# Patient Record
Sex: Female | Born: 1955 | Race: Black or African American | Hispanic: No | Smoking: Never smoker
Health system: Southern US, Community
[De-identification: ages and names within clinical notes are randomized; demographics above are authoritative.]

## PROBLEM LIST (undated history)

## (undated) DIAGNOSIS — G4730 Sleep apnea, unspecified: Secondary | ICD-10-CM

## (undated) DIAGNOSIS — E119 Type 2 diabetes mellitus without complications: Secondary | ICD-10-CM

## (undated) DIAGNOSIS — K635 Polyp of colon: Secondary | ICD-10-CM

## (undated) DIAGNOSIS — I2699 Other pulmonary embolism without acute cor pulmonale: Secondary | ICD-10-CM

## (undated) DIAGNOSIS — E079 Disorder of thyroid, unspecified: Secondary | ICD-10-CM

## (undated) DIAGNOSIS — E785 Hyperlipidemia, unspecified: Secondary | ICD-10-CM

## (undated) DIAGNOSIS — I639 Cerebral infarction, unspecified: Secondary | ICD-10-CM

## (undated) DIAGNOSIS — I10 Essential (primary) hypertension: Secondary | ICD-10-CM

## (undated) HISTORY — PX: KNEE SURGERY: SHX244

## (undated) HISTORY — DX: Type 2 diabetes mellitus without complications: E11.9

## (undated) HISTORY — DX: Polyp of colon: K63.5

## (undated) HISTORY — PX: ABDOMINAL HYSTERECTOMY: SHX81

## (undated) HISTORY — PX: TUBAL LIGATION: SHX77

## (undated) HISTORY — PX: BLADDER SUSPENSION: SHX72

---

## 1997-07-10 ENCOUNTER — Inpatient Hospital Stay (HOSPITAL_COMMUNITY): Admission: AD | Admit: 1997-07-10 | Discharge: 1997-07-10 | Payer: Self-pay | Admitting: Obstetrics

## 1997-08-22 ENCOUNTER — Other Ambulatory Visit: Admission: RE | Admit: 1997-08-22 | Discharge: 1997-08-22 | Payer: Self-pay | Admitting: Obstetrics

## 1997-08-23 ENCOUNTER — Ambulatory Visit (HOSPITAL_COMMUNITY): Admission: RE | Admit: 1997-08-23 | Discharge: 1997-08-23 | Payer: Self-pay | Admitting: Gastroenterology

## 1997-09-01 ENCOUNTER — Ambulatory Visit (HOSPITAL_COMMUNITY): Admission: RE | Admit: 1997-09-01 | Discharge: 1997-09-01 | Payer: Self-pay | Admitting: Gastroenterology

## 1997-09-14 ENCOUNTER — Ambulatory Visit (HOSPITAL_COMMUNITY): Admission: RE | Admit: 1997-09-14 | Discharge: 1997-09-14 | Payer: Self-pay | Admitting: Endocrinology

## 1997-09-20 ENCOUNTER — Ambulatory Visit (HOSPITAL_COMMUNITY): Admission: RE | Admit: 1997-09-20 | Discharge: 1997-09-20 | Payer: Self-pay | Admitting: Endocrinology

## 1997-12-16 ENCOUNTER — Inpatient Hospital Stay (HOSPITAL_COMMUNITY): Admission: AD | Admit: 1997-12-16 | Discharge: 1997-12-16 | Payer: Self-pay | Admitting: *Deleted

## 1998-03-29 ENCOUNTER — Inpatient Hospital Stay (HOSPITAL_COMMUNITY): Admission: RE | Admit: 1998-03-29 | Discharge: 1998-04-02 | Payer: Self-pay | Admitting: *Deleted

## 1998-10-07 ENCOUNTER — Encounter: Payer: Self-pay | Admitting: Emergency Medicine

## 1998-10-07 ENCOUNTER — Emergency Department (HOSPITAL_COMMUNITY): Admission: EM | Admit: 1998-10-07 | Discharge: 1998-10-07 | Payer: Self-pay | Admitting: Emergency Medicine

## 1999-04-25 ENCOUNTER — Ambulatory Visit: Admission: RE | Admit: 1999-04-25 | Discharge: 1999-04-25 | Payer: Self-pay | Admitting: *Deleted

## 1999-05-07 ENCOUNTER — Ambulatory Visit (HOSPITAL_COMMUNITY): Admission: RE | Admit: 1999-05-07 | Discharge: 1999-05-07 | Payer: Self-pay | Admitting: *Deleted

## 2000-03-30 ENCOUNTER — Emergency Department (HOSPITAL_COMMUNITY): Admission: EM | Admit: 2000-03-30 | Discharge: 2000-03-30 | Payer: Self-pay | Admitting: Emergency Medicine

## 2000-05-21 ENCOUNTER — Encounter: Payer: Self-pay | Admitting: Emergency Medicine

## 2000-05-21 ENCOUNTER — Emergency Department (HOSPITAL_COMMUNITY): Admission: EM | Admit: 2000-05-21 | Discharge: 2000-05-22 | Payer: Self-pay | Admitting: Emergency Medicine

## 2000-07-15 ENCOUNTER — Encounter: Payer: Self-pay | Admitting: *Deleted

## 2000-07-15 ENCOUNTER — Ambulatory Visit (HOSPITAL_COMMUNITY): Admission: RE | Admit: 2000-07-15 | Discharge: 2000-07-15 | Payer: Self-pay | Admitting: *Deleted

## 2001-02-09 ENCOUNTER — Emergency Department (HOSPITAL_COMMUNITY): Admission: EM | Admit: 2001-02-09 | Discharge: 2001-02-09 | Payer: Self-pay | Admitting: Emergency Medicine

## 2001-02-09 ENCOUNTER — Encounter: Payer: Self-pay | Admitting: Emergency Medicine

## 2001-02-19 ENCOUNTER — Encounter: Payer: Self-pay | Admitting: Emergency Medicine

## 2001-02-19 ENCOUNTER — Ambulatory Visit (HOSPITAL_COMMUNITY): Admission: RE | Admit: 2001-02-19 | Discharge: 2001-02-19 | Payer: Self-pay | Admitting: Emergency Medicine

## 2001-07-22 ENCOUNTER — Encounter: Payer: Self-pay | Admitting: Internal Medicine

## 2001-07-22 ENCOUNTER — Ambulatory Visit (HOSPITAL_COMMUNITY): Admission: RE | Admit: 2001-07-22 | Discharge: 2001-07-22 | Payer: Self-pay | Admitting: Internal Medicine

## 2001-08-17 ENCOUNTER — Encounter: Payer: Self-pay | Admitting: Internal Medicine

## 2001-08-17 ENCOUNTER — Ambulatory Visit (HOSPITAL_COMMUNITY): Admission: RE | Admit: 2001-08-17 | Discharge: 2001-08-17 | Payer: Self-pay | Admitting: Family Medicine

## 2001-09-10 ENCOUNTER — Encounter: Payer: Self-pay | Admitting: Emergency Medicine

## 2001-09-10 ENCOUNTER — Emergency Department (HOSPITAL_COMMUNITY): Admission: EM | Admit: 2001-09-10 | Discharge: 2001-09-10 | Payer: Self-pay | Admitting: *Deleted

## 2001-09-11 ENCOUNTER — Emergency Department (HOSPITAL_COMMUNITY): Admission: EM | Admit: 2001-09-11 | Discharge: 2001-09-12 | Payer: Self-pay | Admitting: Emergency Medicine

## 2002-06-22 ENCOUNTER — Encounter: Payer: Self-pay | Admitting: Emergency Medicine

## 2002-06-22 ENCOUNTER — Emergency Department (HOSPITAL_COMMUNITY): Admission: EM | Admit: 2002-06-22 | Discharge: 2002-06-22 | Payer: Self-pay | Admitting: Emergency Medicine

## 2002-07-16 ENCOUNTER — Emergency Department (HOSPITAL_COMMUNITY): Admission: EM | Admit: 2002-07-16 | Discharge: 2002-07-16 | Payer: Self-pay | Admitting: Emergency Medicine

## 2003-01-20 ENCOUNTER — Ambulatory Visit (HOSPITAL_COMMUNITY): Admission: RE | Admit: 2003-01-20 | Discharge: 2003-01-20 | Payer: Self-pay | Admitting: Family Medicine

## 2003-05-24 ENCOUNTER — Emergency Department (HOSPITAL_COMMUNITY): Admission: EM | Admit: 2003-05-24 | Discharge: 2003-05-24 | Payer: Self-pay | Admitting: Emergency Medicine

## 2004-06-12 ENCOUNTER — Ambulatory Visit: Payer: Self-pay | Admitting: Internal Medicine

## 2004-07-03 ENCOUNTER — Ambulatory Visit: Payer: Self-pay | Admitting: Internal Medicine

## 2004-07-03 ENCOUNTER — Ambulatory Visit: Payer: Self-pay | Admitting: *Deleted

## 2004-07-16 ENCOUNTER — Ambulatory Visit (HOSPITAL_COMMUNITY): Admission: RE | Admit: 2004-07-16 | Discharge: 2004-07-16 | Payer: Self-pay | Admitting: Internal Medicine

## 2004-07-25 ENCOUNTER — Ambulatory Visit: Payer: Self-pay | Admitting: Internal Medicine

## 2004-08-30 ENCOUNTER — Ambulatory Visit: Payer: Self-pay | Admitting: Internal Medicine

## 2004-10-01 ENCOUNTER — Ambulatory Visit: Payer: Self-pay | Admitting: Internal Medicine

## 2004-10-31 ENCOUNTER — Ambulatory Visit: Payer: Self-pay | Admitting: Internal Medicine

## 2005-04-22 ENCOUNTER — Emergency Department (HOSPITAL_COMMUNITY): Admission: EM | Admit: 2005-04-22 | Discharge: 2005-04-22 | Payer: Self-pay | Admitting: Emergency Medicine

## 2005-05-22 ENCOUNTER — Ambulatory Visit: Payer: Self-pay | Admitting: Internal Medicine

## 2005-06-07 ENCOUNTER — Ambulatory Visit: Payer: Self-pay | Admitting: Internal Medicine

## 2005-07-02 ENCOUNTER — Ambulatory Visit: Payer: Self-pay | Admitting: Internal Medicine

## 2005-07-02 ENCOUNTER — Encounter: Payer: Self-pay | Admitting: Internal Medicine

## 2005-07-15 ENCOUNTER — Ambulatory Visit: Payer: Self-pay | Admitting: Internal Medicine

## 2005-08-05 ENCOUNTER — Ambulatory Visit (HOSPITAL_COMMUNITY): Admission: RE | Admit: 2005-08-05 | Discharge: 2005-08-05 | Payer: Self-pay | Admitting: Family Medicine

## 2005-08-21 ENCOUNTER — Encounter: Admission: RE | Admit: 2005-08-21 | Discharge: 2005-08-21 | Payer: Self-pay | Admitting: Internal Medicine

## 2005-11-15 ENCOUNTER — Ambulatory Visit: Payer: Self-pay | Admitting: Internal Medicine

## 2006-01-05 ENCOUNTER — Emergency Department (HOSPITAL_COMMUNITY): Admission: EM | Admit: 2006-01-05 | Discharge: 2006-01-06 | Payer: Self-pay | Admitting: Emergency Medicine

## 2006-04-02 ENCOUNTER — Ambulatory Visit: Payer: Self-pay | Admitting: Family Medicine

## 2006-04-08 ENCOUNTER — Ambulatory Visit: Payer: Self-pay | Admitting: Internal Medicine

## 2006-04-10 ENCOUNTER — Ambulatory Visit: Payer: Self-pay | Admitting: Internal Medicine

## 2006-06-02 ENCOUNTER — Ambulatory Visit: Payer: Self-pay | Admitting: *Deleted

## 2006-06-26 ENCOUNTER — Ambulatory Visit: Payer: Self-pay | Admitting: Internal Medicine

## 2006-06-26 ENCOUNTER — Inpatient Hospital Stay (HOSPITAL_COMMUNITY): Admission: EM | Admit: 2006-06-26 | Discharge: 2006-06-29 | Payer: Self-pay | Admitting: Emergency Medicine

## 2006-06-27 ENCOUNTER — Encounter (INDEPENDENT_AMBULATORY_CARE_PROVIDER_SITE_OTHER): Payer: Self-pay | Admitting: Neurology

## 2006-06-27 ENCOUNTER — Encounter: Payer: Self-pay | Admitting: Internal Medicine

## 2006-07-02 ENCOUNTER — Encounter: Admission: RE | Admit: 2006-07-02 | Discharge: 2006-09-30 | Payer: Self-pay | Admitting: Neurology

## 2006-07-10 ENCOUNTER — Ambulatory Visit: Payer: Self-pay | Admitting: Internal Medicine

## 2006-10-29 ENCOUNTER — Encounter (INDEPENDENT_AMBULATORY_CARE_PROVIDER_SITE_OTHER): Payer: Self-pay | Admitting: *Deleted

## 2006-11-05 ENCOUNTER — Ambulatory Visit: Payer: Self-pay | Admitting: Internal Medicine

## 2006-11-05 LAB — CONVERTED CEMR LAB
ALT: 25 units/L (ref 0–35)
AST: 17 units/L (ref 0–37)
Alkaline Phosphatase: 97 units/L (ref 39–117)
CO2: 27 meq/L (ref 19–32)
Creatinine, Ser: 0.7 mg/dL (ref 0.40–1.20)
LDL Cholesterol: 66 mg/dL (ref 0–99)
Sodium: 143 meq/L (ref 135–145)
TSH: 0.067 microintl units/mL — ABNORMAL LOW (ref 0.350–5.50)
Total Bilirubin: 0.4 mg/dL (ref 0.3–1.2)
Total CHOL/HDL Ratio: 2.5
Total Protein: 7.2 g/dL (ref 6.0–8.3)
VLDL: 18 mg/dL (ref 0–40)

## 2006-11-11 ENCOUNTER — Ambulatory Visit (HOSPITAL_COMMUNITY): Admission: RE | Admit: 2006-11-11 | Discharge: 2006-11-11 | Payer: Self-pay | Admitting: Family Medicine

## 2006-11-23 ENCOUNTER — Encounter: Admission: RE | Admit: 2006-11-23 | Discharge: 2006-11-23 | Payer: Self-pay | Admitting: Neurology

## 2006-11-26 ENCOUNTER — Emergency Department (HOSPITAL_COMMUNITY): Admission: EM | Admit: 2006-11-26 | Discharge: 2006-11-26 | Payer: Self-pay | Admitting: Emergency Medicine

## 2007-01-02 ENCOUNTER — Ambulatory Visit: Payer: Self-pay | Admitting: Internal Medicine

## 2007-01-02 ENCOUNTER — Encounter (INDEPENDENT_AMBULATORY_CARE_PROVIDER_SITE_OTHER): Payer: Self-pay | Admitting: Family Medicine

## 2007-01-02 LAB — CONVERTED CEMR LAB
AST: 15 units/L (ref 0–37)
Albumin: 4.4 g/dL (ref 3.5–5.2)
Alkaline Phosphatase: 96 units/L (ref 39–117)
BUN: 10 mg/dL (ref 6–23)
Basophils Relative: 1 % (ref 0–1)
Eosinophils Absolute: 0.4 10*3/uL (ref 0.2–0.7)
Eosinophils Relative: 6 % — ABNORMAL HIGH (ref 0–5)
Glucose, Bld: 105 mg/dL — ABNORMAL HIGH (ref 70–99)
HCT: 41.6 % (ref 36.0–46.0)
Lymphs Abs: 2.4 10*3/uL (ref 0.7–4.0)
MCHC: 30.5 g/dL (ref 30.0–36.0)
MCV: 97.7 fL (ref 78.0–100.0)
Monocytes Relative: 8 % (ref 3–12)
Neutrophils Relative %: 47 % (ref 43–77)
Platelets: 341 10*3/uL (ref 150–400)
Potassium: 4.6 meq/L (ref 3.5–5.3)
Sodium: 142 meq/L (ref 135–145)
T3 Uptake Ratio: 31.6 % (ref 22.5–37.0)
T4, Total: 12.2 ug/dL (ref 5.0–12.5)
Total Bilirubin: 0.3 mg/dL (ref 0.3–1.2)
WBC: 6.2 10*3/uL (ref 4.0–10.5)

## 2007-01-20 ENCOUNTER — Ambulatory Visit (HOSPITAL_BASED_OUTPATIENT_CLINIC_OR_DEPARTMENT_OTHER): Admission: RE | Admit: 2007-01-20 | Discharge: 2007-01-20 | Payer: Self-pay | Admitting: Family Medicine

## 2007-01-25 ENCOUNTER — Ambulatory Visit: Payer: Self-pay | Admitting: Internal Medicine

## 2007-02-17 ENCOUNTER — Ambulatory Visit: Payer: Self-pay | Admitting: Internal Medicine

## 2007-02-17 LAB — CONVERTED CEMR LAB
GC Probe Amp, Urine: NEGATIVE
TSH: 1.984 microintl units/mL (ref 0.350–5.50)

## 2007-03-10 ENCOUNTER — Encounter: Payer: Self-pay | Admitting: Internal Medicine

## 2007-03-10 ENCOUNTER — Other Ambulatory Visit: Admission: RE | Admit: 2007-03-10 | Discharge: 2007-03-10 | Payer: Self-pay | Admitting: Internal Medicine

## 2007-03-10 ENCOUNTER — Ambulatory Visit: Payer: Self-pay | Admitting: Internal Medicine

## 2007-03-12 ENCOUNTER — Ambulatory Visit: Payer: Self-pay | Admitting: Internal Medicine

## 2007-04-15 ENCOUNTER — Ambulatory Visit: Payer: Self-pay | Admitting: Internal Medicine

## 2007-04-15 LAB — CONVERTED CEMR LAB
ALT: 26 units/L (ref 0–35)
AST: 18 units/L (ref 0–37)
Albumin: 4.4 g/dL (ref 3.5–5.2)
CO2: 28 meq/L (ref 19–32)
Calcium: 9.3 mg/dL (ref 8.4–10.5)
Chloride: 101 meq/L (ref 96–112)
Cholesterol: 154 mg/dL (ref 0–200)
Microalb, Ur: 0.64 mg/dL (ref 0.00–1.89)
Potassium: 4.3 meq/L (ref 3.5–5.3)
Total Protein: 7.8 g/dL (ref 6.0–8.3)

## 2007-07-10 ENCOUNTER — Ambulatory Visit: Payer: Self-pay | Admitting: Internal Medicine

## 2007-07-11 ENCOUNTER — Encounter (INDEPENDENT_AMBULATORY_CARE_PROVIDER_SITE_OTHER): Payer: Self-pay | Admitting: Internal Medicine

## 2007-07-11 LAB — CONVERTED CEMR LAB
ALT: 18 units/L (ref 0–35)
Albumin: 4.1 g/dL (ref 3.5–5.2)
CO2: 27 meq/L (ref 19–32)
Calcium: 9.2 mg/dL (ref 8.4–10.5)
Chloride: 102 meq/L (ref 96–112)
Glucose, Bld: 83 mg/dL (ref 70–99)
Potassium: 4.6 meq/L (ref 3.5–5.3)
Sodium: 144 meq/L (ref 135–145)
TSH: 4.395 microintl units/mL (ref 0.350–5.50)
Total Bilirubin: 0.4 mg/dL (ref 0.3–1.2)
Total Protein: 7.3 g/dL (ref 6.0–8.3)

## 2007-10-01 ENCOUNTER — Ambulatory Visit: Payer: Self-pay | Admitting: Internal Medicine

## 2007-11-12 ENCOUNTER — Ambulatory Visit: Payer: Self-pay | Admitting: Internal Medicine

## 2007-12-11 ENCOUNTER — Ambulatory Visit (HOSPITAL_COMMUNITY): Admission: RE | Admit: 2007-12-11 | Discharge: 2007-12-11 | Payer: Self-pay | Admitting: Internal Medicine

## 2008-03-20 ENCOUNTER — Emergency Department (HOSPITAL_COMMUNITY): Admission: EM | Admit: 2008-03-20 | Discharge: 2008-03-20 | Payer: Self-pay | Admitting: Emergency Medicine

## 2008-04-14 ENCOUNTER — Ambulatory Visit: Payer: Self-pay | Admitting: Internal Medicine

## 2008-04-14 LAB — CONVERTED CEMR LAB
Band Neutrophils: 0 % (ref 0–10)
Basophils Absolute: 0 10*3/uL (ref 0.0–0.1)
Basophils Relative: 1 % (ref 0–1)
HCT: 41.7 % (ref 36.0–46.0)
Magnesium: 1.9 mg/dL (ref 1.5–2.5)
Monocytes Absolute: 0.4 10*3/uL (ref 0.1–1.0)
Neutro Abs: 3 10*3/uL (ref 1.7–7.7)
Platelets: 367 10*3/uL (ref 150–400)
RDW: 15.1 % (ref 11.5–15.5)

## 2008-05-20 ENCOUNTER — Ambulatory Visit: Payer: Self-pay | Admitting: Internal Medicine

## 2008-07-06 ENCOUNTER — Ambulatory Visit (HOSPITAL_COMMUNITY): Admission: RE | Admit: 2008-07-06 | Discharge: 2008-07-06 | Payer: Self-pay | Admitting: Internal Medicine

## 2008-07-06 ENCOUNTER — Ambulatory Visit: Payer: Self-pay | Admitting: Internal Medicine

## 2008-07-30 ENCOUNTER — Emergency Department (HOSPITAL_COMMUNITY): Admission: EM | Admit: 2008-07-30 | Discharge: 2008-07-30 | Payer: Self-pay | Admitting: Family Medicine

## 2008-09-06 ENCOUNTER — Ambulatory Visit: Payer: Self-pay | Admitting: Internal Medicine

## 2008-10-20 ENCOUNTER — Ambulatory Visit: Payer: Self-pay | Admitting: Infectious Diseases

## 2008-10-20 ENCOUNTER — Inpatient Hospital Stay (HOSPITAL_COMMUNITY): Admission: EM | Admit: 2008-10-20 | Discharge: 2008-10-25 | Payer: Self-pay | Admitting: Emergency Medicine

## 2008-10-21 ENCOUNTER — Encounter: Payer: Self-pay | Admitting: Infectious Diseases

## 2008-10-21 ENCOUNTER — Encounter (INDEPENDENT_AMBULATORY_CARE_PROVIDER_SITE_OTHER): Payer: Self-pay | Admitting: Internal Medicine

## 2008-10-22 ENCOUNTER — Ambulatory Visit: Payer: Self-pay | Admitting: Surgery

## 2008-10-25 ENCOUNTER — Encounter: Payer: Self-pay | Admitting: Infectious Diseases

## 2008-10-26 ENCOUNTER — Telehealth (INDEPENDENT_AMBULATORY_CARE_PROVIDER_SITE_OTHER): Payer: Self-pay | Admitting: *Deleted

## 2008-10-28 ENCOUNTER — Ambulatory Visit: Payer: Self-pay | Admitting: Internal Medicine

## 2009-01-10 ENCOUNTER — Ambulatory Visit: Payer: Self-pay | Admitting: Internal Medicine

## 2009-01-31 ENCOUNTER — Ambulatory Visit: Payer: Self-pay | Admitting: Internal Medicine

## 2009-02-01 ENCOUNTER — Encounter (INDEPENDENT_AMBULATORY_CARE_PROVIDER_SITE_OTHER): Payer: Self-pay | Admitting: Internal Medicine

## 2009-02-28 ENCOUNTER — Ambulatory Visit: Payer: Self-pay | Admitting: Family Medicine

## 2009-02-28 ENCOUNTER — Encounter (INDEPENDENT_AMBULATORY_CARE_PROVIDER_SITE_OTHER): Payer: Self-pay | Admitting: Internal Medicine

## 2009-02-28 LAB — CONVERTED CEMR LAB: TSH: 1.493 microintl units/mL (ref 0.350–4.500)

## 2009-03-13 ENCOUNTER — Ambulatory Visit: Payer: Self-pay | Admitting: Internal Medicine

## 2009-04-14 ENCOUNTER — Ambulatory Visit: Payer: Self-pay | Admitting: Family Medicine

## 2009-04-15 ENCOUNTER — Encounter (INDEPENDENT_AMBULATORY_CARE_PROVIDER_SITE_OTHER): Payer: Self-pay | Admitting: Internal Medicine

## 2009-04-28 ENCOUNTER — Ambulatory Visit: Payer: Self-pay | Admitting: Internal Medicine

## 2009-05-05 ENCOUNTER — Ambulatory Visit: Payer: Self-pay | Admitting: Internal Medicine

## 2009-05-05 ENCOUNTER — Ambulatory Visit (HOSPITAL_COMMUNITY): Admission: RE | Admit: 2009-05-05 | Discharge: 2009-05-05 | Payer: Self-pay | Admitting: Internal Medicine

## 2009-05-12 ENCOUNTER — Ambulatory Visit (HOSPITAL_COMMUNITY): Admission: RE | Admit: 2009-05-12 | Discharge: 2009-05-12 | Payer: Self-pay | Admitting: Internal Medicine

## 2009-06-05 ENCOUNTER — Ambulatory Visit: Payer: Self-pay | Admitting: Internal Medicine

## 2009-06-07 ENCOUNTER — Ambulatory Visit: Payer: Self-pay | Admitting: Internal Medicine

## 2009-07-05 ENCOUNTER — Emergency Department (HOSPITAL_COMMUNITY): Admission: EM | Admit: 2009-07-05 | Discharge: 2009-07-05 | Payer: Self-pay | Admitting: Emergency Medicine

## 2009-07-20 ENCOUNTER — Ambulatory Visit: Payer: Self-pay | Admitting: Family Medicine

## 2009-07-20 LAB — CONVERTED CEMR LAB
CO2: 27 meq/L (ref 19–32)
Chloride: 103 meq/L (ref 96–112)
Sodium: 141 meq/L (ref 135–145)
Vit D, 25-Hydroxy: 39 ng/mL (ref 30–89)

## 2009-09-18 ENCOUNTER — Ambulatory Visit: Payer: Self-pay | Admitting: Internal Medicine

## 2009-09-18 LAB — CONVERTED CEMR LAB: TSH: 1.495 microintl units/mL (ref 0.350–4.500)

## 2009-10-07 ENCOUNTER — Emergency Department (HOSPITAL_COMMUNITY)
Admission: EM | Admit: 2009-10-07 | Discharge: 2009-10-07 | Payer: Self-pay | Source: Home / Self Care | Admitting: Emergency Medicine

## 2009-11-20 ENCOUNTER — Ambulatory Visit: Payer: Self-pay | Admitting: Internal Medicine

## 2010-03-02 ENCOUNTER — Ambulatory Visit (HOSPITAL_COMMUNITY)
Admission: RE | Admit: 2010-03-02 | Discharge: 2010-03-02 | Payer: Self-pay | Source: Home / Self Care | Attending: Family Medicine | Admitting: Family Medicine

## 2010-03-02 ENCOUNTER — Encounter (INDEPENDENT_AMBULATORY_CARE_PROVIDER_SITE_OTHER): Payer: Self-pay | Admitting: Family Medicine

## 2010-03-02 LAB — CONVERTED CEMR LAB
Hemoglobin: 12.5 g/dL (ref 12.0–15.0)
Lymphocytes Relative: 38 % (ref 12–46)
Lymphs Abs: 2.2 10*3/uL (ref 0.7–4.0)
Monocytes Absolute: 0.5 10*3/uL (ref 0.1–1.0)
Monocytes Relative: 8 % (ref 3–12)
Neutro Abs: 2.9 10*3/uL (ref 1.7–7.7)
RBC: 4.37 M/uL (ref 3.87–5.11)

## 2010-03-04 ENCOUNTER — Encounter: Payer: Self-pay | Admitting: Internal Medicine

## 2010-03-26 ENCOUNTER — Encounter (INDEPENDENT_AMBULATORY_CARE_PROVIDER_SITE_OTHER): Payer: Self-pay | Admitting: *Deleted

## 2010-03-27 ENCOUNTER — Encounter: Payer: Self-pay | Admitting: Internal Medicine

## 2010-04-04 NOTE — Letter (Signed)
Summary: [facility] Instructions  [facility]  [address] [place], [place] [address]   Phone: [phone]  Fax: [phone]       ARDIS LAWLEY    1955/07/02    MRN: [id number]        Procedure Day Dorna Bloom:  Jake Shark  04/10/10     Arrival Time:  10:30AM     Procedure Time:  11:30AM     Location of Procedure:                    Juliann Pares _  [facility] (4th Floor)                      PREPARATION FOR COLONOSCOPY WITH MOVIPREP   Starting 5 days prior to your procedure 04/05/10 do not eat nuts, seeds, popcorn, corn, beans, peas,  salads, or any raw vegetables.  Do not take any fiber supplements (e.g. Metamucil, Citrucel, and Benefiber).  THE DAY BEFORE YOUR PROCEDURE         DATE:  04/09/10    DAY: MONDAY  1.  Drink clear liquids the entire day-NO SOLID FOOD  2.  Do not drink anything colored red or purple.  Avoid juices with pulp.  No orange juice.  3.  Drink at least 64 oz. (8 glasses) of fluid/clear liquids during the day to prevent dehydration and help the prep work efficiently.  CLEAR LIQUIDS INCLUDE: Water Jello Ice Popsicles Tea (sugar ok, no milk/cream) Powdered fruit flavored drinks Coffee (sugar ok, no milk/cream) Gatorade Juice: apple, white grape, white cranberry  Lemonade Clear bullion, consomm, broth Carbonated beverages (any kind) Strained chicken noodle soup Hard Candy                             4.  In the morning, mix first dose of MoviPrep solution:    Empty 1 Pouch A and 1 Pouch B into the disposable container    Add lukewarm drinking water to the top line of the container. Mix to dissolve    Refrigerate (mixed solution should be used within 24 hrs)  5.  Begin drinking the prep at 5:00 p.m. The MoviPrep container is divided by 4 marks.   Every 15 minutes drink the solution down to the next mark (approximately 8 oz) until the full liter is complete.   6.  Follow completed prep with 16 oz of clear liquid of your choice  (Nothing red or purple).  Continue to drink clear liquids until bedtime.  7.  Before going to bed, mix second dose of MoviPrep solution:    Empty 1 Pouch A and 1 Pouch B into the disposable container    Add lukewarm drinking water to the top line of the container. Mix to dissolve    Refrigerate  THE DAY OF YOUR PROCEDURE      DATE: 04/10/10   DAY: TUESDAY  Beginning at 6:30AM (5 hours before procedure):         1. Every 15 minutes, drink the solution down to the next mark (approx 8 oz) until the full liter is complete.  2. Follow completed prep with 16 oz. of clear liquid of your choice.    3. You may drink clear liquids until 9:30AM (2 HOURS BEFORE PROCEDURE).   MEDICATION INSTRUCTIONS    Additional medication instructions: Take only your Metoprolol the morning of procedure.  OTHER INSTRUCTIONS  You will need a responsible adult at least 57 years of age to accompany you and drive you home.   This person must remain in the waiting room during your procedure.  Wear loose fitting clothing that is easily removed.  Leave jewelry and other valuables at home.  However, you may wish to bring a book to read or  an iPod/MP3 player to listen to music as you wait for your procedure to start.  Remove all body piercing jewelry and leave at home.  Total time from sign-in until discharge is approximately 2-3 hours.  You should go home directly after your procedure and rest.  You can resume normal activities the  day after your procedure.  The day of your procedure you should not:   Drive   Make legal decisions   Operate machinery   Drink alcohol   Return to work  You will receive specific instructions about eating, activities and medications before you leave.    The above instructions have been reviewed and explained to me by   Wyona Almas RN  March 27, 2010 9:41 AM     I fully understand and can verbalize these instructions  _____________________________ Date _________

## 2010-04-04 NOTE — Miscellaneous (Signed)
Summary: [facility] Previsit/prep  Clinical Lists Changes  Observations: Added new observation of NKA: T (03/27/2010 8:58)

## 2010-04-09 ENCOUNTER — Telehealth (INDEPENDENT_AMBULATORY_CARE_PROVIDER_SITE_OTHER): Payer: Self-pay

## 2010-04-10 ENCOUNTER — Other Ambulatory Visit (AMBULATORY_SURGERY_CENTER): Payer: Self-pay | Admitting: Internal Medicine

## 2010-04-10 ENCOUNTER — Other Ambulatory Visit: Payer: Self-pay | Admitting: Internal Medicine

## 2010-04-10 DIAGNOSIS — Z1211 Encounter for screening for malignant neoplasm of colon: Secondary | ICD-10-CM

## 2010-04-10 DIAGNOSIS — D126 Benign neoplasm of colon, unspecified: Secondary | ICD-10-CM

## 2010-04-10 DIAGNOSIS — K5730 Diverticulosis of large intestine without perforation or abscess without bleeding: Secondary | ICD-10-CM

## 2010-04-16 ENCOUNTER — Encounter: Payer: Self-pay | Admitting: Internal Medicine

## 2010-04-19 NOTE — Progress Notes (Signed)
   Phone Note Call from Patient   Summary of Call: Pt misplaced prep instructions.  Given via telephone.  (original phone note deleted.) Initial call taken by: Doristine Church RN II,  April 09, 2010 9:31 AM

## 2010-04-19 NOTE — Procedures (Addendum)
Summary: Colonoscopy  Patient: [name] Note: All result statuses are Final unless otherwise noted.  Tests: (1) Colonoscopy (COL)   COL Colonoscopy           DONE     [facility]     [address]     [place], [place]  [address]          COLONOSCOPY PROCEDURE REPORT          PATIENT:  Tiffany Strickland, Tiffany Strickland  MR#:  [id number]     BIRTHDATE:  06/06/1955, 54 yrs. old  GENDER:  female     ENDOSCOPIST:  Wilhemina Bonito. Eda Keys, MD     REF. BY:  Dow Adolph, M.D.     PROCEDURE DATE:  04/10/2010     PROCEDURE:  Colonoscopy with snare polypectomy x 1     ASA CLASS:  Class II     INDICATIONS:  Routine Risk Screening     MEDICATIONS:   Fentanyl 75 mcg IV, Versed 7 mg IV          DESCRIPTION OF PROCEDURE:   After the risks benefits and     alternatives of the procedure were thoroughly explained, informed     consent was obtained.  Digital rectal exam was performed and     revealed no abnormalities.   The LB 180AL [id number] endoscope was     introduced through the anus and advanced to the cecum, which was     identified by both the appendix and ileocecal valve, without     limitations.Time to cecum = 4:00 min.  The quality of the prep was     excellent, using MoviPrep.  The instrument was then slowly     withdrawn (time = 9:07 min) as the colon was fully examined.     <<PROCEDUREIMAGES>>          FINDINGS:  A 7mm pedunculated polyp was found in the sigmoid     colon. Polyp was snared without cautery. Retrieval was successful.     Scattered diverticula were found.  Otherwise normal colonoscopy     without other polyps, masses, vascular ectasias, or inflammatory     changes.   Retroflexed views in the rectum revealed no     abnormalities.    The scope was then withdrawn from the patient     and the procedure completed.          COMPLICATIONS:  None          ENDOSCOPIC IMPRESSION:     1) Pedunculated polyp in the sigmoid colon -removed     2) Diverticula, scattered     3)  Otherwise nl colonoscopy          RECOMMENDATIONS:     1) Repeat colonoscopy in 5 years if polyp adenomatous; otherwise     10 years          ______________________________     Wilhemina Bonito. Eda Keys, MD          CC:  Dow Adolph, MD;  The Patient          n.     eSIGNED:   Wilhemina Bonito. Eda Keys at 04/10/2010 01:11 PM          [name], [id number]  Note: An exclamation mark (!) indicates a result that was not dispersed into the flowsheet. Document Creation Date: 04/10/2010 1:12 PM _______________________________________________________________________  (1) Order result status: Final Collection or observation date-time: 04/10/2010 13:06 Requested date-time:  Receipt date-time:  Reported date-time:  Referring Physician:   Ordering Physician: Fransico Setters [phone]) Specimen Source:  Source: Launa Grill Order Number: [id number] Lab site:   Appended Document: Colonoscopy recall     Procedures Next Due Date:    Colonoscopy: 03/2013

## 2010-04-24 NOTE — Letter (Signed)
Summary: Patient Notice- Polyp Results  [facility]  [address] [place], [place] [address]   Phone: [phone]  Fax: [phone]        April 16, 2010 MRN: [id number]    [facility] [address] [place], [place]  [address]    Dear Tiffany Strickland,  I am pleased to inform you that the colon polyp(s) removed during your recent colonoscopy was (were) found to be benign (no cancer detected) upon pathologic examination.  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  Should you develop new or worsening symptoms of abdominal pain, bowel habit changes or bleeding from the rectum or bowels, please schedule an evaluation with either your primary care physician or with me.  Additional information/recommendations:  __ No further action with gastroenterology is needed at this time. Please      follow-up with your primary care physician for your other healthcare      needs.   Please call us if you are having persistent problems or have questions about your condition that have not been fully answered at this time.  Sincerely,  Hilarie Fredrickson MD  This letter has been electronically signed by your physician.  Appended Document: Patient Notice- Polyp Results letter mailed

## 2010-05-18 LAB — BASIC METABOLIC PANEL
BUN: 12 mg/dL (ref 6–23)
BUN: 8 mg/dL (ref 6–23)
BUN: 8 mg/dL (ref 6–23)
CO2: 33 mEq/L — ABNORMAL HIGH (ref 19–32)
CO2: 34 mEq/L — ABNORMAL HIGH (ref 19–32)
Calcium: 8.6 mg/dL (ref 8.4–10.5)
Calcium: 8.8 mg/dL (ref 8.4–10.5)
Calcium: 9 mg/dL (ref 8.4–10.5)
Chloride: 100 mEq/L (ref 96–112)
Chloride: 100 mEq/L (ref 96–112)
Chloride: 103 mEq/L (ref 96–112)
Creatinine, Ser: 0.78 mg/dL (ref 0.4–1.2)
Creatinine, Ser: 0.78 mg/dL (ref 0.4–1.2)
GFR calc non Af Amer: >60 mL/min (ref >60)
GFR calc non Af Amer: >60 mL/min (ref >60)
Glucose, Bld: 108 mg/dL — ABNORMAL HIGH (ref 70–99)
Glucose, Bld: 118 mg/dL — ABNORMAL HIGH (ref 70–99)
Glucose, Bld: 118 mg/dL — ABNORMAL HIGH (ref 70–99)
Glucose, Bld: 128 mg/dL — ABNORMAL HIGH (ref 70–99)
Glucose, Bld: 131 mg/dL — ABNORMAL HIGH (ref 70–99)
Potassium: 3.8 mEq/L (ref 3.5–5.1)
Potassium: 3.9 mEq/L (ref 3.5–5.1)
Potassium: 4 mEq/L (ref 3.5–5.1)
Sodium: 137 mEq/L (ref 135–145)
Sodium: 142 mEq/L (ref 135–145)

## 2010-05-18 LAB — CBC
HCT: 36.6 % (ref 36.0–46.0)
HCT: 37 % (ref 36.0–46.0)
HCT: 38.3 % (ref 36.0–46.0)
HCT: 38.5 % (ref 36.0–46.0)
Hemoglobin: 12.1 g/dL (ref 12.0–15.0)
Hemoglobin: 12.1 g/dL (ref 12.0–15.0)
MCHC: 33.1 g/dL (ref 30.0–36.0)
MCV: 91.2 fL (ref 78.0–100.0)
MCV: 91.9 fL (ref 78.0–100.0)
Platelets: 269 10*3/uL (ref 150–400)
Platelets: 274 10*3/uL (ref 150–400)
Platelets: 277 10*3/uL (ref 150–400)
Platelets: 282 10*3/uL (ref 150–400)
RDW: 14.1 % (ref 11.5–15.5)
RDW: 14.4 % (ref 11.5–15.5)
RDW: 14.4 % (ref 11.5–15.5)
RDW: 14.7 % (ref 11.5–15.5)
WBC: 5.4 10*3/uL (ref 4.0–10.5)
WBC: 7 10*3/uL (ref 4.0–10.5)

## 2010-05-18 LAB — LIPID PANEL
Cholesterol: 154 mg/dL (ref 0–200)
LDL Cholesterol: 98 mg/dL (ref 0–99)
Total CHOL/HDL Ratio: 3.7 RATIO
Triglycerides: 69 mg/dL (ref <150)
VLDL: 14 mg/dL (ref 0–40)

## 2010-05-18 LAB — POCT CARDIAC MARKERS
CKMB, poc: 1.2 ng/mL (ref 1.0–8.0)
Troponin i, poc: <0.05 ng/mL (ref 0.00–0.09)

## 2010-05-18 LAB — COMPREHENSIVE METABOLIC PANEL
AST: 18 U/L (ref 0–37)
Albumin: 3.5 g/dL (ref 3.5–5.2)
Calcium: 9.3 mg/dL (ref 8.4–10.5)
Chloride: 101 mEq/L (ref 96–112)
Creatinine, Ser: 0.84 mg/dL (ref 0.4–1.2)
GFR calc Af Amer: >60 mL/min (ref >60)
Total Protein: 6.7 g/dL (ref 6.0–8.3)

## 2010-05-18 LAB — CK TOTAL AND CKMB (NOT AT ARMC)
CK, MB: 1.5 ng/mL (ref 0.3–4.0)
Relative Index: INVALID (ref 0.0–2.5)
Total CK: 99 U/L (ref 7–177)

## 2010-05-18 LAB — CARDIAC PANEL(CRET KIN+CKTOT+MB+TROPI)
CK, MB: 1 ng/mL (ref 0.3–4.0)
Relative Index: INVALID (ref 0.0–2.5)
Relative Index: INVALID (ref 0.0–2.5)
Total CK: 73 U/L (ref 7–177)
Total CK: 89 U/L (ref 7–177)
Troponin I: 0.01 ng/mL (ref 0.00–0.06)

## 2010-05-18 LAB — DIFFERENTIAL
Eosinophils Absolute: 0.3 10*3/uL (ref 0.0–0.7)
Eosinophils Relative: 5 % (ref 0–5)
Lymphs Abs: 1.7 10*3/uL (ref 0.7–4.0)

## 2010-05-18 LAB — HEMOGLOBIN A1C
Hgb A1c MFr Bld: 6.8 % — ABNORMAL HIGH (ref 4.6–6.1)
Mean Plasma Glucose: 148 mg/dL

## 2010-05-18 LAB — TROPONIN I: Troponin I: 0.01 ng/mL (ref 0.00–0.06)

## 2010-05-18 LAB — GLUCOSE, CAPILLARY: Glucose-Capillary: 134 mg/dL — ABNORMAL HIGH (ref 70–99)

## 2010-06-26 NOTE — H&P (Signed)
NAMECRYSTALL, Tiffany Strickland             ACCOUNT NO.:  [id number]   MEDICAL RECORD NO.:  [id number]          PATIENT TYPE:  EMS   LOCATION:  MAJO                         FACILITY:  [facility]   PHYSICIAN:  Genene Churn. Love, M.D.    DATE OF BIRTH:  Nov 09, 1955   DATE OF ADMISSION:  06/26/2006  DATE OF DISCHARGE:                              HISTORY & PHYSICAL   This 55 year old, right-handed African American female is seen in the  emergency room for code stroke and to evaluate right-sided weakness.   HISTORY OF PRESENT ILLNESS:  Tiffany Strickland has a 5-year history of  hypertension.  She has no known history of diabetes, heart disease,  stroke, cigarette use, drug abuse, or trauma.  She was in her usual  state of health at 8:00 this morning when she turned after getting  dressed to get ready to go to work and noted the onset of dizziness with  right-sided numbness.  She had no nausea and no vomiting.  She did  notice a dizziness without true spinning.  She was noted to have slurred  speech by her family, but this was very mild. She was brought to the  emergency room as a code stroke, with initially NIH Stroke Scale of 4, 1  for dysarthria 1 for loss of sensation in right face, and 1 each for  mild right arm and right leg drift.   PAST MEDICAL HISTORY:  1. Hypertension.  2. Hypothyroidism.  3. Obesity.  4. Hospitalization for pregnancies.  5. Hysterectomy.  6. Bladder tack in  2002.  7. Tubal ligation.  8. Once hospitalized for a stomach problem.   CURRENT MEDICATIONS:  1. Polyethylene glycol for constipation.  2. Levothyroxine 175 mcg per day.  3. Colace 100 mg per day.  4. Hydrochlorothiazide 25 mg daily.   SOCIAL HISTORY:  She does not smoke cigarettes.  She has no known drug  allergies.  She does not drink alcohol.  She finished high school and  takes college courses. She works in day care.  She has five children,  sons 65 and 17, and daughters 84, 17, and 51, living and well.   FAMILY  HISTORY:  Her mother is 27, living and well.  Her father's  history is unknown.  She has no brothers or sisters.   PHYSICAL EXAMINATION:  GENERAL:  Revealed a well-developed obese female.  VITAL SIGNS:  Blood pressure right left arm 160/80, heart rate was 64  and regular.  NECK:  There were no carotid supraclavicular bruits heard.  Neck flexion  and extension maneuvers were unremarkable.  NEUROLOGIC:  Mental status:  She was alert and oriented x3 and followed  one, two, and three-step commands.  There was no aphasia.  There was no  agnosia. There was no apraxia.  Cranial nerve examination revealed  visual fields to be full.  Both disks were seen and flat.  The  extraocular which were full, and corneals were present.  Facial  sensation was initially decreased on the right, but then subsequently  thought to be normal.  Her tongue was midline.  The uvula was midline.  Gags were present.  She had a dysarthria.  Her motor examination  initially revealed mild right arm drift and mild right leg drift, but  never touched the bed.  Her sensory examination was intact in her limbs.  She had no evidence of extinction, but she did have altered sensation on  the right side of her face.  Her deep tendon reflexes were 1-2+.  Plantar responses were bilaterally downgoing.  Gait was not evaluated.  HEENT:  General examination revealed the tympanic membranes to be clear.  Mouth was in good repair.  LUNGS:  Clear to auscultation.  HEART:  Revealed no murmurs.  ABDOMEN:  The bowel sounds were normal.  EXTREMITIES:  There was no cyanosis, clubbing, or edema in the  extremities.   CT scan of the brain showed question of a hemorrhage in the left  posterior fossa near the quadrigeminal cistern.  There was no evidence  of any other acute abnormality.  MRI of the brain showed evidence of  acute stroke in the left basal ganglia region mesiotemporal area, and  was thought that by MRA she probably had clot in the  left PCA accounting  for the increased density in the PCA.  Her NIH Stroke Scale, however,  improved from an NIH Stroke Scale of 4 to an NIH stroke Scale of 1, with  only very mild dysarthria present.  Decision about giving TPA because  time would have still allowed it was made and discussed with Dr. Pearlean Brownie.  It was decided that it was a tough call to make at this point in time  and, in view of the improvement in her NIH Stroke Scale, it  decided not  to give it with an NIH Stroke Scale of one.   IMPRESSION:  1. Left brain stroke, code 434.01.  2. Disease in the left posterior cerebral artery by magnetic resonance      angiography.  3. Hypertension, code 796.2.  4. Obesity, code 278.01.  5. Hypothyroidism, code 244.9.   PLAN AT THIS TIME:  To admit the patient for treatment of her stroke.           ______________________________  Genene Churn. Sandria Manly, M.D.     JML/MEDQ  D:  06/26/2006  T:  06/26/2006  Job:  [id number]   cc:   Dineen Kid. Reche Dixon, M.D.

## 2010-06-26 NOTE — Discharge Summary (Signed)
NAMEMYLENE, BOW             ACCOUNT NO.:  [id number]   MEDICAL RECORD NO.:  [id number]          PATIENT TYPE:  INP   LOCATION:  3015                         FACILITY:  [facility]   PHYSICIAN:  Marlan Palau, M.D.  DATE OF BIRTH:  20-Aug-1955   DATE OF ADMISSION:  06/26/2006  DATE OF DISCHARGE:  06/29/2006                               DISCHARGE SUMMARY   ADMISSION DIAGNOSES:  1. Left brain stroke with right hemisensory deficit left thalamic      stroke.  2. Hypertension.  3. Hypothyroidism.  4. Obesity.   DISCHARGE DIAGNOSES:  1. 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  2. Obesity.  3. Hypertension.  4. Hypothyroidism.  5. Probable early diabetes.   PROCEDURES DURING THIS ADMISSION:  1. CT of the head.  2. MRI of the brain.  3. MRI angiogram.  4. 2-D echocardiogram.  5. Carotid Doppler study.   COMPLICATIONS OF THE ABOVE PROCEDURES:  None.   HISTORY OF PRESENT ILLNESS:  Tiffany Strickland is a 55 year old black  female born 05/05/1955 with a history of hypertension and obesity.  The patient comes in to [facility] with onset around 8:00 a.m.  on the day of admission with trouble with dizziness, right-sided  numbness.  The patient reported no nausea or vomiting.  The patient had  no true vertigo.  The patient also was felt to have some mild slurred  speech.  Presented to the emergency room as a code stroke.  NIH stroke  scale of 4.  The patient had a CT of the head that revealed a tiny  possible punctate hemorrhage in the left mesiotemporal lobe or possible  calcification.   PAST MEDICAL HISTORY:  1. History of hypertension.  2. Hypothyroidism.  3. Obesity.  4. Hysterectomy.  5. Bladder resuspension procedure in 2002.  6. Bilateral tubal ligation.  7. Left thalamic stroke as above.  8. Elevated hemoglobin A1c, probable early diabetes.   MEDICATIONS ON ADMISSION:  1. MiraLax if  needed for constipation, 17 grams.  2. Levothyroxine 0.175 mg daily.  3. Colace 100 mg daily.  4. Hydrochlorothiazide 25 mg daily.   SOCIAL HISTORY:  The patient does not smoke or drink alcohol.   ALLERGIES:  No known allergies.   SOCIAL HISTORY/FAMILY HISTORY/REVIEW OF SYSTEMS/PHYSICAL EXAMINATION:  Please refer to history and physical dictation summary.   LABORATORY DATA:  Sodium 136, potassium 3.7, chloride of 97, CO2 of 32,  glucose 119, creatinine 0.77, BUN of 14.  Homocysteine level 5.7.  The  patient has a white count 5.7, hemoglobin 12.3, hematocrit 37.4,  platelets of 301, INR 1.0, total protein 6.5, albumin 3.3, AST of 15,  ALT of 19, ALP of 71, total bilirubin 0.5, hemoglobin A1c of 6.8.  CK  level 131, MB fraction 1.6.  Lipid profile revealed cholesterol level of  155, triglycerides of 91, HDL 41, VLDL of 18, LDL of 96.  Urine drug  screen was negative.  Urinalysis reveals specific gravity of 1.020, pH  of 5.5; otherwise unremarkable.  Alcohol level  of zero.   CHEST X-RAY:  Prominent heart size with mild vascular congestion.  No  edema.   ELECTROCARDIOGRAM:  Normal sinus rhythm, normal EKG with a heart rate of  61.   HOSPITAL COURSE:  This patient was admitted to [facility] for  evaluation of a right hemisensory deficit, NIH stroke scale score of 4.  Initially, there was some question of a punctate hemorrhage on CT, but  this turned out to be calcification.  MRI of the brain was done that  showed an area of infarct in the left lateral thalamus.  MRI angiogram  of the intracranial vessels shows occlusion or high-grade stenosis of  the left posterior cerebral artery with associated infarct.  The patient  underwent other workup that included a 2-D echocardiogram and carotid  Doppler studies.  Carotid Doppler study reveals 60-80% internal carotid  artery stenosis in the upper range and 60-80% internal carotid artery  stenosis on the left side in the lower range  of the scale.  This patient  has been placed on low-dose aspirin converted to Aggrenox at this time.  The patient was seen by physical and occupational therapy and has done  well.  The patient is ambulating 350 feet unassisted.  The patient has  complained of some undulations in the right-sided tingling sensations  and had some pain in both feet in the morning and some intermittent  tingling of the left hand at times, again in the early morning.  The  patient at this point is bright, alert, cooperative, has full strength,  is able to ambulate without assistance.  Romberg negative.  No drift is  seen.  Plans are at this point to discharge the patient to home with  follow up with Dr. Pearlean Brownie within the next 3-4 weeks and to initiate  outpatient physical and occupational therapy.  The patient is to remain  out of work for the next 4 weeks.   DISCHARGE MEDICATIONS:  1. MiraLax 17 grams if needed for constipation.  2. Levothyroxine 0.175 mg daily.  3. Colace 100 mg daily.  4. Hydrochlorothiazide 25 mg day.  5. Aspirin 81 mg a day for 2 weeks and then stop.  6. Aggrenox 1 tablet daily for 2 weeks and then go to one twice daily.   Again, outpatient physical and occupational therapy will be done.   DIET:  The patient is to remain on a low-carbohydrate diet.      Marlan Palau, M.D.  Electronically Signed     CKW/MEDQ  D:  06/29/2006  T:  06/29/2006  Job:  [id number]   cc:   [facility]  Dineen Kid. Reche Dixon, M.D.

## 2010-06-26 NOTE — H&P (Signed)
NAMEERION, HERMANS             ACCOUNT NO.:  [id number]   MEDICAL RECORD NO.:  [id number]          PATIENT TYPE:  INP   LOCATION:  3015                         FACILITY:  [facility]   PHYSICIAN:  Genene Churn. Love, M.D.    DATE OF BIRTH:  [birth year]   DATE OF ADMISSION:  06/26/2006  DATE OF DISCHARGE:                              HISTORY & PHYSICAL   ADDENDUM:  I spent a total of one half hour of critical care time with [name] and her family and with neuroradiology in making decisions  regarding clot found in PCA.  Regarding discissions concerning tPA,  whether or not to give tPA in this patient with a code stroke called,  who initially was thought to have had a hemorrhage.           ______________________________  Genene Churn. Sandria Manly, M.D.     JML/MEDQ  D:  06/27/2006  T:  06/27/2006  Job:  [id number]

## 2010-06-29 NOTE — Procedures (Signed)
NAME:  Tiffany Strickland, Tiffany Strickland             ACCOUNT NO.:  [id number]   MEDICAL RECORD NO.:  [id number]          PATIENT TYPE:  OUT   LOCATION:  SLEEP CENTER                 FACILITY:  [facility]   PHYSICIAN:  Clinton D. Maple Hudson, MD, FCCP, FACPDATE OF BIRTH:  1955-07-02   DATE OF STUDY:                            NOCTURNAL POLYSOMNOGRAM   REFERRING PHYSICIAN:  Maurice March, M.D.   INDICATION FOR STUDY:  Hypersomnia with sleep apnea.   EPWORTH SLEEPINESS SCORE:  17/24.  BMI 42.  Weight 253 pounds.  Height  65 inches.   MEDICATIONS:  Home medication listed and reviewed.   SLEEP ARCHITECTURE:  Split study protocol.  During the diagnostic phase,  total sleep time was 120 minutes with sleep efficiency 89%.  Stage I was  8%.  Stage II 92%.  Stages III and REM were absent.  Sleep latency four  minutes.  Awake after sleep onset 10 minutes.  Arousal index 71,  indicating increase EEG arousal.   BEDTIME MEDICATION:  Included two extra strength pain relievers,  Aggrenox and Pravastatin.   RESPIRATORY DATA:  Split study protocol.  Apnea hypopnea index (AHI) 119  obstructive events per hour, indicating sever obstructive sleep  apnea/hypopnea syndrome before CPAP.  This included 10 obstructive  apneas and 228 hypopneas.  Events were not positional.  CPAP was  titrated to 16 CWP, AHI zero per hour.  A medium Mirage Quattro mask was  used with heated humidifier.   OXYGEN DATA:  Very loud snoring with oxygen desaturation to a nadir of  64% before CPAP.  After CPAP control, oxygen saturation held 93% on room  air.   CARDIAC DATA:  Normal sinus rhythm.   MOVEMENT-PARASOMNIA:  No significant movement disturbance.  Bathroom  times two.   IMPRESSIONS-RECOMMENDATIONS:  1. Severe obstructive sleep apnea/hypopnea syndrome, AHI 119 per hour      with non-positional events, very loud snoring and oxygen      desaturation to a nadir of 64%.  2. Successful CPAP titration to 16 CWP, AHI zero per hour.  A  medium      ResMed Mirage Quattro mask was used with heated humidifier.      Clinton D. Maple Hudson, MD, [facility], FACP  Diplomate, Biomedical engineer of Sleep Medicine  Electronically Signed     CDY/MEDQ  D:  01/25/2007 10:51:24  T:  01/26/2007 10:37:37  Job:  [id number]

## 2010-07-05 ENCOUNTER — Other Ambulatory Visit (HOSPITAL_COMMUNITY): Payer: Self-pay | Admitting: Family Medicine

## 2010-07-05 DIAGNOSIS — Z1231 Encounter for screening mammogram for malignant neoplasm of breast: Secondary | ICD-10-CM

## 2010-07-19 ENCOUNTER — Ambulatory Visit (HOSPITAL_COMMUNITY): Payer: Self-pay

## 2010-07-27 ENCOUNTER — Ambulatory Visit (HOSPITAL_COMMUNITY)
Admission: RE | Admit: 2010-07-27 | Discharge: 2010-07-27 | Disposition: A | Discharge status: Home / Self Care | Payer: Self-pay | Source: Ambulatory Visit | Attending: Family Medicine | Admitting: Family Medicine

## 2010-07-27 DIAGNOSIS — Z1231 Encounter for screening mammogram for malignant neoplasm of breast: Secondary | ICD-10-CM | POA: Insufficient documentation

## 2010-11-21 LAB — DIFFERENTIAL
Lymphs Abs: 1.7
Monocytes Relative: 9
Neutro Abs: 2.5
Neutrophils Relative %: 50

## 2010-11-21 LAB — I-STAT 8, (EC8 V) (CONVERTED LAB)
Acid-Base Excess: 4 — ABNORMAL HIGH
Chloride: 103
HCT: 45
Operator id: 146091
Potassium: 3.9

## 2010-11-21 LAB — URINALYSIS, ROUTINE W REFLEX MICROSCOPIC
Bilirubin Urine: NEGATIVE
Ketones, ur: NEGATIVE
Nitrite: NEGATIVE
Urobilinogen, UA: 0.2
pH: 7.5

## 2010-11-21 LAB — CBC
MCV: 91.7
RBC: 4.23
WBC: 5.1

## 2010-11-21 LAB — POCT I-STAT CREATININE: Creatinine, Ser: 0.8

## 2010-12-08 ENCOUNTER — Inpatient Hospital Stay (INDEPENDENT_AMBULATORY_CARE_PROVIDER_SITE_OTHER)
Admission: RE | Admit: 2010-12-08 | Discharge: 2010-12-08 | Disposition: A | Discharge status: Home / Self Care | Payer: Self-pay | Source: Ambulatory Visit | Attending: Family Medicine | Admitting: Family Medicine

## 2010-12-08 DIAGNOSIS — J019 Acute sinusitis, unspecified: Secondary | ICD-10-CM

## 2010-12-08 DIAGNOSIS — J40 Bronchitis, not specified as acute or chronic: Secondary | ICD-10-CM

## 2011-01-14 ENCOUNTER — Other Ambulatory Visit (HOSPITAL_COMMUNITY): Payer: Self-pay | Admitting: Cardiology

## 2011-01-21 ENCOUNTER — Encounter (HOSPITAL_COMMUNITY)
Admission: RE | Admit: 2011-01-21 | Discharge: 2011-01-21 | Disposition: A | Discharge status: Home / Self Care | Payer: Self-pay | Source: Ambulatory Visit | Attending: Cardiology | Admitting: Cardiology

## 2011-01-21 DIAGNOSIS — I4949 Other premature depolarization: Secondary | ICD-10-CM | POA: Insufficient documentation

## 2011-01-21 DIAGNOSIS — R079 Chest pain, unspecified: Secondary | ICD-10-CM | POA: Insufficient documentation

## 2011-01-21 DIAGNOSIS — I10 Essential (primary) hypertension: Secondary | ICD-10-CM | POA: Insufficient documentation

## 2011-01-21 DIAGNOSIS — Z8679 Personal history of other diseases of the circulatory system: Secondary | ICD-10-CM | POA: Insufficient documentation

## 2011-01-21 DIAGNOSIS — R0602 Shortness of breath: Secondary | ICD-10-CM | POA: Insufficient documentation

## 2011-01-21 MED ORDER — TECHNETIUM TC 99M TETROFOSMIN IV KIT
30.0000 | PACK | Freq: Once | INTRAVENOUS | Status: AC | PRN
  Administered 2011-01-21: 30 via INTRAVENOUS

## 2011-01-22 ENCOUNTER — Ambulatory Visit (HOSPITAL_COMMUNITY)
Admission: RE | Admit: 2011-01-22 | Discharge: 2011-01-22 | Disposition: A | Discharge status: Home / Self Care | Payer: Self-pay | Source: Ambulatory Visit | Attending: Cardiology | Admitting: Cardiology

## 2011-01-22 ENCOUNTER — Encounter (HOSPITAL_COMMUNITY)
Admission: RE | Admit: 2011-01-22 | Discharge: 2011-01-22 | Disposition: A | Discharge status: Home / Self Care | Payer: Self-pay | Source: Ambulatory Visit | Attending: Cardiology | Admitting: Cardiology

## 2011-01-22 ENCOUNTER — Ambulatory Visit (HOSPITAL_COMMUNITY)
Admission: RE | Admit: 2011-01-22 | Discharge: 2011-01-22 | Payer: Self-pay | Source: Ambulatory Visit | Attending: Cardiology | Admitting: Cardiology

## 2011-01-22 DIAGNOSIS — I10 Essential (primary) hypertension: Secondary | ICD-10-CM | POA: Insufficient documentation

## 2011-01-22 DIAGNOSIS — E785 Hyperlipidemia, unspecified: Secondary | ICD-10-CM | POA: Insufficient documentation

## 2011-01-22 DIAGNOSIS — R072 Precordial pain: Secondary | ICD-10-CM | POA: Insufficient documentation

## 2011-01-22 DIAGNOSIS — I379 Nonrheumatic pulmonary valve disorder, unspecified: Secondary | ICD-10-CM | POA: Insufficient documentation

## 2011-01-22 DIAGNOSIS — I079 Rheumatic tricuspid valve disease, unspecified: Secondary | ICD-10-CM | POA: Insufficient documentation

## 2011-01-22 DIAGNOSIS — I059 Rheumatic mitral valve disease, unspecified: Secondary | ICD-10-CM | POA: Insufficient documentation

## 2011-01-22 MED ORDER — TECHNETIUM TC 99M TETROFOSMIN IV KIT
30.0000 | PACK | Freq: Once | INTRAVENOUS | Status: AC | PRN
  Administered 2011-01-22: 30 via INTRAVENOUS

## 2011-01-22 NOTE — Progress Notes (Signed)
  Echocardiogram 2D Echocardiogram has been performed.  Zhane Bluitt Nira Retort 01/22/2011, 1:05 PM

## 2011-05-24 ENCOUNTER — Emergency Department (HOSPITAL_COMMUNITY)
Admission: EM | Admit: 2011-05-24 | Discharge: 2011-05-25 | Disposition: A | Discharge status: Home / Self Care | Payer: Self-pay | Attending: Emergency Medicine | Admitting: Emergency Medicine

## 2011-05-24 ENCOUNTER — Encounter (HOSPITAL_COMMUNITY): Payer: Self-pay | Admitting: *Deleted

## 2011-05-24 DIAGNOSIS — R1310 Dysphagia, unspecified: Secondary | ICD-10-CM | POA: Insufficient documentation

## 2011-05-24 DIAGNOSIS — I10 Essential (primary) hypertension: Secondary | ICD-10-CM | POA: Insufficient documentation

## 2011-05-24 DIAGNOSIS — Z7982 Long term (current) use of aspirin: Secondary | ICD-10-CM | POA: Insufficient documentation

## 2011-05-24 DIAGNOSIS — Z8673 Personal history of transient ischemic attack (TIA), and cerebral infarction without residual deficits: Secondary | ICD-10-CM | POA: Insufficient documentation

## 2011-05-24 DIAGNOSIS — K219 Gastro-esophageal reflux disease without esophagitis: Secondary | ICD-10-CM | POA: Insufficient documentation

## 2011-05-24 HISTORY — DX: Essential (primary) hypertension: I10

## 2011-05-24 HISTORY — DX: Cerebral infarction, unspecified: I63.9

## 2011-05-24 NOTE — ED Notes (Signed)
The pts meds have been changed and for the past 2 days she has felt at times that her airway was blocking up. No respiratory difficulty at present.  No pain..  She has seasonal allergies

## 2011-05-25 MED ORDER — PANTOPRAZOLE SODIUM 20 MG PO TBEC: 20.0000 mg | DELAYED_RELEASE_TABLET | Freq: Every day | ORAL | Status: DC

## 2011-05-25 NOTE — ED Notes (Signed)
PT ambulated with a steady gait; no signs of distress; respirations even and unlabored; skin warm and dry; A&Ox3; no questions at this time.

## 2011-05-25 NOTE — ED Notes (Signed)
PT reports synthroid dose decreased. Pt reports sleepy all the time and feels like there is something making it difficult for her to breathe. PT is resting with no respiratory distress noted. Pt's throat clear at this time.

## 2011-05-25 NOTE — ED Provider Notes (Addendum)
History     CSN: [id number]  Arrival date & time 05/24/11  2240   First MD Initiated Contact with Patient 05/25/11 0118      Chief Complaint  Patient presents with  . symptoms from medication     (Consider location/radiation/quality/duration/timing/severity/associated sxs/prior treatment) HPI History provided by the patient. Chief complaint is difficulty swallowing. Patient is able to swallow liquids and solids but feels like it gets hung up. She has previous history of reflux and currently is not taking medications for this. She has been belching a lot more recently but denies any heartburn at this time. History of stroke but no speech or gait difficulty. No weakness or numbness. No headache. Patient is worried that her symptoms may be related to recent medication changes. She stopped her lisinopril and now takes amlodipine and HCTZ the last 7 days. Symptoms started about 3 days ago. She is currently drinking water without difficulty. No syncope or weakness. No chest pain or shortness of breath. No pain with symptoms. Past Medical History  Diagnosis Date  . Hypertension   . Stroke     History reviewed. No pertinent past surgical history.  No family history on file.  History  Substance Use Topics  . Smoking status: Never Smoker   . Smokeless tobacco: Not on file  . Alcohol Use: No    OB History    Grav Para Term Preterm Abortions TAB SAB Ect Mult Living                  Review of Systems  Constitutional: Negative for fever and chills.  HENT: Negative for neck pain and neck stiffness.   Eyes: Negative for pain.  Respiratory: Negative for shortness of breath.   Cardiovascular: Negative for chest pain.  Gastrointestinal: Negative for abdominal pain.  Genitourinary: Negative for dysuria.  Musculoskeletal: Negative for back pain.  Skin: Negative for rash.  Neurological: Negative for headaches.  All other systems reviewed and are negative.    Allergies  Review of  patient's allergies indicates no known allergies.  Home Medications   Current Outpatient Rx  Name Route Sig Dispense Refill  . AMLODIPINE BESYLATE 10 MG PO TABS Oral Take 10 mg by mouth daily.    . ASPIRIN-DIPYRIDAMOLE ER 25-200 MG PO CP12 Oral Take 1 capsule by mouth 2 (two) times daily.    Marland Kitchen HYDROCHLOROTHIAZIDE 25 MG PO TABS Oral Take 25 mg by mouth daily.    Marland Kitchen LEVOTHYROXINE SODIUM 137 MCG PO TABS Oral Take 137 mcg by mouth daily.    Marland Kitchen POLYETHYLENE GLYCOL 3350 PO PACK Oral Take 17 g by mouth daily.      BP 130/72  Pulse 64  Temp(Src) 97 F (36.1 C) (Oral)  Resp 20  SpO2 98%  Physical Exam  Constitutional: She is oriented to person, place, and time. She appears well-developed and well-nourished.  HENT:  Head: Normocephalic and atraumatic.  Eyes: Conjunctivae and EOM are normal. Pupils are equal, round, and reactive to light.  Neck: Trachea normal. Neck supple. No tracheal deviation present. No thyromegaly present.  Cardiovascular: Normal rate, regular rhythm, S1 normal, S2 normal and normal pulses.     No systolic murmur is present   No diastolic murmur is present  Pulses:      Radial pulses are 2+ on the right side, and 2+ on the left side.  Pulmonary/Chest: Effort normal and breath sounds normal. She has no wheezes. She has no rhonchi. She has no rales. She exhibits no tenderness.  Abdominal: Soft. Normal appearance and bowel sounds are normal. There is no tenderness. There is no CVA tenderness and negative Murphy's sign.  Musculoskeletal:       BLE:s Calves nontender, no cords or erythema, negative Homans sign  Neurological: She is alert and oriented to person, place, and time. She has normal strength. No cranial nerve deficit or sensory deficit. GCS eye subscore is 4. GCS verbal subscore is 5. GCS motor subscore is 6.  Skin: Skin is warm and dry. No rash noted. She is not diaphoretic.  Psychiatric: Her speech is normal.       Cooperative and appropriate    ED Course    Procedures (including critical care time)  Tolerates by mouth fluids without distress.   Consult a pharmacist on call and reviewed medications- there are no known drug related symptoms or side effects to explain dysphagia.   MDM   Dysphasia new onset history of GERD. Plan Protonix and GI referral. No indication for emergency department imaging or labs at this time. Symptoms do not suggest acute or subacute stroke.  On further history patient has had enlarged thyroid in the past and reports what sounds like radiation treatment to shrink it. I do not appreciate any thyromegaly or masses exam. Case discussed with radiologist recommendations he states given exam does not need emergent CT scan, but would benefit from outpatient ultrasound of thyroid and swallow study. Referral for same provided.      Sunnie Nielsen, MD 05/25/11 1610     Sunnie Nielsen, MD 05/25/11 [phone]

## 2011-05-25 NOTE — ED Notes (Addendum)
Pharmacy called and asked if amlodipine of HCTZ can cause pt's symptoms she presents with. Pharmacy reported they strongly suspected these drugs are not the cause.

## 2011-05-25 NOTE — Discharge Instructions (Signed)
Dysphagia Swallowing problems (dysphagia) occur when solids and liquids seem to stick in your throat on the way down to your stomach, or the food takes longer to get to the stomach. Other symptoms (problems) include regurgitating (burping) up food, noises coming from the throat, chest discomfort with swallowing, and a feeling of fullness in the throat when swallowing. When blockage in the throat is complete it may be associated with drooling. CAUSES There are many causes of swallowing difficulties and the following is generalized information regarding a number of reasons for this problem. Problems with swallowing may occur because of problems with the muscles. The food cannot be propelled in the usual manner into the stomach. There may be ulcers, scar tissueor inflammation (soreness) in the esophagus (the food tube from the mouth to the stomach) which blocks food from passing normally into the stomach. Causes of inflammation include acid reflux from the stomach into the esophagus. Inflammation can also be caused by the herpes simplex virus, Candida (yeast), radiation (as with treatment of cancer), or inflammation from medications not taken with adequate fluids to wash them down into the stomach. There may be nerve problems so signals cannot be sent adequately telling the muscles of the esophagus to contract and move the food along. Achalasia is a rare disorder of the esophagus in which muscular contractions of the esophagus are uncoordinated. Globus hystericus is a relatively common problem in young females in which there is a sense of an obstruction or difficulty in swallowing, but in which no abnormalities can be found. This problem usually improves over time with reassurance and testing to rule out other causes. DIAGNOSIS A number of tests will help your caregiver know what is the cause of your swallowing problems. These tests may include a barium swallow in which x-rays are taken while you are drinking a  liquid that outlines the lining of the esophagus on x-ray. If the stomach and small bowel are also studied in this manner it is called an upper gastrointestinal exam (UGI). Endoscopy may be done in which your caregiver examines your throat, esophagus, stomach and small bowel with an instrument like a small flexible telescope. Motility studies which measure the effectiveness and coordination of the muscular contractions of the esophagus may also be done. TREATMENT The treatment of swallowing problems are many, varying from medications to surgical treatment. The treatment varies with the type of problem found. Your caregiver will discuss your results and treatment with you. If swallowing problems are severe the long term problems which may occur include: malnutrition, pneumonia (from food going into the breathing tubes called trachea and bronchi), and an increase in tumors (lumps) of the esophagus. SEEK IMMEDIATE MEDICAL CARE IF:  Food or other object becomes lodged in your throat or esophagus and won't move.

## 2011-06-17 ENCOUNTER — Emergency Department (HOSPITAL_COMMUNITY): Payer: Self-pay

## 2011-06-17 ENCOUNTER — Encounter (HOSPITAL_COMMUNITY): Payer: Self-pay | Admitting: Emergency Medicine

## 2011-06-17 ENCOUNTER — Emergency Department (HOSPITAL_COMMUNITY)
Admission: EM | Admit: 2011-06-17 | Discharge: 2011-06-17 | Disposition: A | Discharge status: Home / Self Care | Payer: Self-pay | Attending: Emergency Medicine | Admitting: Emergency Medicine

## 2011-06-17 DIAGNOSIS — M1711 Unilateral primary osteoarthritis, right knee: Secondary | ICD-10-CM

## 2011-06-17 DIAGNOSIS — R609 Edema, unspecified: Secondary | ICD-10-CM | POA: Insufficient documentation

## 2011-06-17 DIAGNOSIS — I10 Essential (primary) hypertension: Secondary | ICD-10-CM | POA: Insufficient documentation

## 2011-06-17 DIAGNOSIS — IMO0002 Reserved for concepts with insufficient information to code with codable children: Secondary | ICD-10-CM | POA: Insufficient documentation

## 2011-06-17 DIAGNOSIS — M25569 Pain in unspecified knee: Secondary | ICD-10-CM | POA: Insufficient documentation

## 2011-06-17 DIAGNOSIS — M1710 Unilateral primary osteoarthritis, unspecified knee: Secondary | ICD-10-CM | POA: Insufficient documentation

## 2011-06-17 HISTORY — DX: Disorder of thyroid, unspecified: E07.9

## 2011-06-17 MED ORDER — OXYCODONE-ACETAMINOPHEN 5-325 MG PO TABS: 1.0000 | ORAL_TABLET | Freq: Four times a day (QID) | ORAL | Status: AC | PRN

## 2011-06-17 MED ORDER — MELOXICAM 7.5 MG PO TABS: 7.5000 mg | ORAL_TABLET | Freq: Every day | ORAL | Status: DC

## 2011-06-17 MED ORDER — OXYCODONE-ACETAMINOPHEN 5-325 MG PO TABS
1.0000 | ORAL_TABLET | Freq: Once | ORAL | Status: AC
  Administered 2011-06-17: 1 via ORAL
  Filled 2011-06-17: qty 1

## 2011-06-17 NOTE — Progress Notes (Signed)
Orthopedic Tech Progress Note Patient Details:  Tiffany Strickland 09/08/1955 [id number]  Other Ortho Devices Type of Ortho Device: Knee Sleeve Ortho Device Location: right knee Ortho Device Interventions: Application   Israella Hubert 06/17/2011, 2:22 PM

## 2011-06-17 NOTE — ED Notes (Signed)
Ortho tech called for knee sleeve.  

## 2011-06-17 NOTE — ED Notes (Signed)
Pt c/o right knee pain x 1 week; pt denies obvious injury 

## 2011-06-17 NOTE — Progress Notes (Signed)
Orthopedic Tech Progress Note Patient Details:  Tiffany Strickland Mar 20, 1955 [id number]  Other Ortho Devices Type of Ortho Device: Ace wrap Ortho Device Location: right knee Ortho Device Interventions: Application   Wai Minotti 06/17/2011, 2:34 PM
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
## 2011-06-17 NOTE — Progress Notes (Signed)
Orthopedic Tech Progress Note Patient Details:  Tiffany Strickland [birth year] [id number]  Patient ID: [name], female   DOB: March 26, 1955, 56 y.o.   MRN: [id number] Viewed order from doctor's order list  Nikki Dom 06/17/2011, 2:35 PM

## 2011-06-17 NOTE — ED Notes (Signed)
Patient transported to X-ray 

## 2011-06-17 NOTE — Discharge Instructions (Signed)
Degenerative Arthritis  You have osteoarthritis. This is the wear and tear arthritis that comes with aging. It is also called degenerative arthritis. This is common in people past middle age. It is caused by stress on the joints. The large weight bearing joints of the lower extremities are most often affected. The knees, hips, back, neck, and hands can become painful, swollen, and stiff. This is the most common type of arthritis. It comes on with age, carrying too much weight, or from an injury.  Treatment includes resting the sore joint until the pain and swelling improve. Crutches or a walker may be needed for severe flares. Only take over-the-counter or prescription medicines for pain, discomfort, or fever as directed by your caregiver. Local heat therapy may improve motion. Cortisone shots into the joint are sometimes used to reduce pain and swelling during flares.  Osteoarthritis is usually not crippling and progresses slowly. There are things you can do to decrease pain:  · Avoid high impact activities.  · Exercise regularly.  · Low impact exercises such as walking, biking and swimming help to keep the muscles strong and keep normal joint function.  · Stretching helps to keep your range of motion.  · Lose weight if you are overweight. This reduces joint stress.  In severe cases when you have pain at rest or increasing disability, joint surgery may be helpful. See your caregiver for follow-up treatment as recommended.   SEEK IMMEDIATE MEDICAL CARE IF:   · You have severe joint pain.  · Marked swelling and redness in your joint develops.  · You develop a high fever.  Document Released: 01/28/2005 Document Revised: 01/17/2011 Document Reviewed: 06/30/2006  [employer]® Patient Information ©2012 [employer].

## 2011-07-24 ENCOUNTER — Other Ambulatory Visit (HOSPITAL_COMMUNITY): Payer: Self-pay | Admitting: Family Medicine

## 2011-07-24 DIAGNOSIS — Z1231 Encounter for screening mammogram for malignant neoplasm of breast: Secondary | ICD-10-CM

## 2011-08-19 ENCOUNTER — Ambulatory Visit (HOSPITAL_COMMUNITY): Payer: Self-pay | Attending: Family Medicine

## 2012-01-02 ENCOUNTER — Encounter (HOSPITAL_COMMUNITY): Payer: Self-pay | Admitting: *Deleted

## 2012-01-02 ENCOUNTER — Emergency Department (HOSPITAL_COMMUNITY)
Admission: EM | Admit: 2012-01-02 | Discharge: 2012-01-02 | Disposition: A | Discharge status: Home / Self Care | Payer: Self-pay | Source: Home / Self Care

## 2012-01-02 DIAGNOSIS — S3669XA Other injury of rectum, initial encounter: Secondary | ICD-10-CM

## 2012-01-02 DIAGNOSIS — K6289 Other specified diseases of anus and rectum: Secondary | ICD-10-CM

## 2012-01-02 NOTE — ED Provider Notes (Signed)
Patient Demographics  Tiffany Strickland, is a 55 y.o. female  WUJ:[id number]  NFA:[id number]  DOB - 11-29-1955  Chief Complaint  Patient presents with  . Rectal Bleeding  . Knee Pain        Subjective:   [name] today has, No headache, No chest pain, No abdominal pain - No Nausea, No new weakness tingling or numbness, No Cough - SOB. Had small amount of blood after her bowel movement on toilet paper 4 days ago, no further episodes.  Objective:    Filed Vitals:   01/02/12 1616  BP: 144/78  Pulse: 91  Temp: 98.4 F (36.9 C)  TempSrc: Oral  SpO2: 98%     Exam  Awake Alert, Oriented X 3, No new F.N deficits, Normal affect [place].AT,PERRAL Supple Neck,No JVD, No cervical lymphadenopathy appriciated.  Symmetrical Chest wall movement, Good air movement bilaterally, CTAB RRR,No Gallops,Rubs or new Murmurs, No Parasternal Heave +ve B.Sounds, Abd Soft, Non tender, No organomegaly appriciated, No rebound - guarding or rigidity. 3 o clock position small rectal tear No Cyanosis, Clubbing or edema, No new Rash or bruise     Data Review   CBC No results found for this basename: WBC:5,HGB:5,HCT:5,PLT:5,MCV:5,MCH:5,MCHC:5,RDW:5,NEUTRABS:5,LYMPHSABS:5,MONOABS:5,EOSABS:5,BASOSABS:5,BANDABS:5,BANDSABD:5 in the last 168 hours  Chemistries   No results found for this basename: NA:5,K:5,CL:5,CO2:5,GLUCOSE:5,BUN:5,CREATININE:5,GFRCGP,:5,CALCIUM:5,MG:5,AST:5,ALT:5,ALKPHOS:5,BILITOT:5 in the last 168 hours ------------------------------------------------------------------------------------------------------------------ No results found for this basename: HGBA1C:2 in the last 72 hours ------------------------------------------------------------------------------------------------------------------ No results found for this basename: CHOL:2,HDL:2,LDLCALC:2,TRIG:2,CHOLHDL:2,LDLDIRECT:2 in the last 72  hours ------------------------------------------------------------------------------------------------------------------ No results found for this basename: TSH,T4TOTAL,FREET3,T3FREE,THYROIDAB in the last 72 hours ------------------------------------------------------------------------------------------------------------------ No results found for this basename: VITAMINB12:2,FOLATE:2,FERRITIN:2,TIBC:2,IRON:2,RETICCTPCT:2 in the last 72 hours  Coagulation profile  No results found for this basename: INR:5,PROTIME:5 in the last 168 hours  Urinalysis    Component Value Date/Time   COLORURINE YELLOW 11/26/2006 1340   APPEARANCEUR CLEAR 11/26/2006 1340   LABSPEC 1.003* 11/26/2006 1340   PHURINE 7.5 11/26/2006 1340   GLUCOSEU NEGATIVE 11/26/2006 1340   HGBUR NEGATIVE 11/26/2006 1340   BILIRUBINUR NEGATIVE 11/26/2006 1340   KETONESUR NEGATIVE 11/26/2006 1340   PROTEINUR NEGATIVE 11/26/2006 1340   UROBILINOGEN 0.2 11/26/2006 1340   NITRITE NEGATIVE 11/26/2006 1340   LEUKOCYTESUR NEGATIVE MICROSCOPIC NOT DONE ON URINES WITH NEGATIVE PROTEIN, BLOOD, LEUKOCYTES, NITRITE, OR GLUCOSE <1000 mg/dL. 11/26/2006 1340     Prior to Admission medications   Medication Sig Start Date End Date Taking? Authorizing Provider  hydrochlorothiazide (HYDRODIURIL) 25 MG tablet Take 25 mg by mouth daily.   Yes Historical Provider, MD  levothyroxine (SYNTHROID, LEVOTHROID) 137 MCG tablet Take 137 mcg by mouth daily.   Yes Historical Provider, MD  amLODipine (NORVASC) 10 MG tablet Take 10 mg by mouth daily.    Historical Provider, MD  dipyridamole-aspirin (AGGRENOX) 25-200 MG per 12 hr capsule Take 1 capsule by mouth 2 (two) times daily.    Historical Provider, MD  meloxicam (MOBIC) 7.5 MG tablet Take 1 tablet (7.5 mg total) by mouth daily. 06/17/11 06/16/12  Gwyneth Sprout, MD  polyethylene glycol (MIRALAX / GLYCOLAX) packet Take 17 g by mouth daily.    Historical Provider, MD     Assessment & Plan   Blood per  rectum x 1, 4 days ago - Patient presents with trace amount of blood on her toilet paper after she had a bowel movement 4 days ago, this was a one-time 40s ago since then she has had for normal bowel movements, denies any abdominal pain or cramping, no nausea vomiting, does not have any history of GI bleed  in the past, no dark colored stools.  She is hemodynamically stable, rectal exam shows small rectal tear at 3 0 clock position, Hemoccult stool is negative, patient is completely symptom free at this point with no reoccurrence of rectal bleed in the last 4 days. Will be discharged home on her home medications.   Leroy Sea M.D on 01/02/2012 at 4:35 PM   Leroy Sea, MD 01/02/12 [phone]

## 2012-01-02 NOTE — ED Notes (Signed)
Pt reports bright red blood after having stool on Sunday - no more bleeding since - also states that her knees are weak and aching/needs med refill on lisinopril & levothyrozine

## 2012-03-17 ENCOUNTER — Emergency Department (HOSPITAL_COMMUNITY)
Admission: EM | Admit: 2012-03-17 | Discharge: 2012-03-17 | Disposition: A | Discharge status: Home / Self Care | Payer: No Typology Code available for payment source | Source: Home / Self Care | Attending: Family Medicine | Admitting: Family Medicine

## 2012-03-17 ENCOUNTER — Encounter (HOSPITAL_COMMUNITY): Payer: Self-pay

## 2012-03-17 DIAGNOSIS — I679 Cerebrovascular disease, unspecified: Secondary | ICD-10-CM

## 2012-03-17 DIAGNOSIS — E039 Hypothyroidism, unspecified: Secondary | ICD-10-CM

## 2012-03-17 DIAGNOSIS — R609 Edema, unspecified: Secondary | ICD-10-CM

## 2012-03-17 DIAGNOSIS — R600 Localized edema: Secondary | ICD-10-CM

## 2012-03-17 DIAGNOSIS — J309 Allergic rhinitis, unspecified: Secondary | ICD-10-CM

## 2012-03-17 DIAGNOSIS — I10 Essential (primary) hypertension: Secondary | ICD-10-CM

## 2012-03-17 DIAGNOSIS — G4730 Sleep apnea, unspecified: Secondary | ICD-10-CM

## 2012-03-17 LAB — COMPREHENSIVE METABOLIC PANEL
ALT: 18 U/L (ref 0–35)
Albumin: 3.7 g/dL (ref 3.5–5.2)
Alkaline Phosphatase: 77 U/L (ref 39–117)
BUN: 15 mg/dL (ref 6–23)
Potassium: 4.4 mEq/L (ref 3.5–5.1)
Sodium: 139 mEq/L (ref 135–145)
Total Protein: 7.9 g/dL (ref 6.0–8.3)

## 2012-03-17 LAB — LIPID PANEL
HDL: 51 mg/dL (ref >39)
LDL Cholesterol: 111 mg/dL — ABNORMAL HIGH (ref 0–99)
Triglycerides: 101 mg/dL (ref <150)

## 2012-03-17 MED ORDER — LISINOPRIL-HYDROCHLOROTHIAZIDE 20-25 MG PO TABS: 1.0000 | ORAL_TABLET | Freq: Every day | ORAL | Status: DC

## 2012-03-17 MED ORDER — LEVOTHYROXINE SODIUM 137 MCG PO TABS: 137.0000 ug | ORAL_TABLET | Freq: Every day | ORAL | Status: DC

## 2012-03-17 MED ORDER — POLYETHYLENE GLYCOL 3350 17 G PO PACK: 17.0000 g | PACK | Freq: Every day | ORAL | Status: DC

## 2012-03-17 NOTE — ED Provider Notes (Signed)
History     CSN: [id number]  Arrival date & time 03/17/12  1013   First MD Initiated Contact with Patient 03/17/12 1041      Chief Complaint  Patient presents with  . Medication Refill   HPI Pt says that she is here to have her meds renewed.  She had a stroke in 2004.  She has been taking aggrenox since 2004.  She says that she normally takes her medication.  Pt says that she was told that she needed to have her thyroid medications changed but she never followed up.  She does not know if she was supposed to have a higher or lower dose of thyroid medications.  She did not bring her medical records with her.     Past Medical History  Diagnosis Date  . Hypertension   . Stroke   . Thyroid disease     Past Surgical History  Procedure Date  . Abdominal hysterectomy     No family history on file.  History  Substance Use Topics  . Smoking status: Never Smoker   . Smokeless tobacco: Not on file  . Alcohol Use: No    OB History    Grav Para Term Preterm Abortions TAB SAB Ect Mult Living                 Review of Systems  Constitutional: Positive for fatigue.  Cardiovascular: Positive for leg swelling.  Musculoskeletal: Positive for back pain, joint swelling, arthralgias and gait problem.  All other systems reviewed and are negative.    Allergies  Review of patient's allergies indicates no known allergies.  Home Medications   Current Outpatient Rx  Name  Route  Sig  Dispense  Refill  . LISINOPRIL-HYDROCHLOROTHIAZIDE 20-25 MG PO TABS   Oral   Take 1 tablet by mouth daily.         Marland Kitchen AMLODIPINE BESYLATE 10 MG PO TABS   Oral   Take 10 mg by mouth daily.         . ASPIRIN-DIPYRIDAMOLE ER 25-200 MG PO CP12   Oral   Take 1 capsule by mouth 2 (two) times daily.         Marland Kitchen HYDROCHLOROTHIAZIDE 25 MG PO TABS   Oral   Take 25 mg by mouth daily.         Marland Kitchen LEVOTHYROXINE SODIUM 137 MCG PO TABS   Oral   Take 137 mcg by mouth daily.         . MELOXICAM 7.5 MG  PO TABS   Oral   Take 1 tablet (7.5 mg total) by mouth daily.   20 tablet   0   . POLYETHYLENE GLYCOL 3350 PO PACK   Oral   Take 17 g by mouth daily.          BP 149/60  Pulse 65  Temp 97.6 F (36.4 C) (Oral)  Resp 16  SpO2 100%  Physical Exam  Nursing note and vitals reviewed. Constitutional: She is oriented to person, place, and time. She appears well-developed and well-nourished. No distress.  HENT:  Head: Normocephalic and atraumatic.  Eyes: Conjunctivae normal and EOM are normal. Pupils are equal, round, and reactive to light.  Neck: Normal range of motion. Neck supple. No JVD present. No tracheal deviation present. No thyromegaly present.  Cardiovascular: Normal rate, regular rhythm and normal heart sounds.   Pulmonary/Chest: Effort normal and breath sounds normal.  Abdominal: Soft. Bowel sounds are normal.  Lymphadenopathy:  She has no cervical adenopathy.  Neurological: She is alert and oriented to person, place, and time.  Skin: Skin is warm and dry.  Psychiatric: She has a normal mood and affect. Her behavior is normal. Judgment and thought content normal.    ED Course  Procedures (including critical care time)  Labs Reviewed - No data to display No results found.  No diagnosis found.  MDM  IMPRESSION  Hypertension  OSA  Hypothyroidism  Cerebrovascular Disease   RECOMMENDATIONS / PLAN Check labs today Refilled medications. Check TSH, T4 Pt declined flu vaccine today Encouraged pt to use her CPAP regularly (she has not been using it) Requested old records  FOLLOW UP 3 months   The patient was given clear instructions to go to ER or return to medical center if symptoms don't improve, worsen or new problems develop.  The patient verbalized understanding.  The patient was told to call to get lab results if they haven't heard anything in the next week.            Cleora Fleet, MD 03/17/12 1513

## 2012-03-17 NOTE — ED Notes (Signed)
Medication refill- history of thyroid issues also stated had a stroke and HTN

## 2012-03-18 LAB — HEMOGLOBIN A1C: Hgb A1c MFr Bld: 7.2 % — ABNORMAL HIGH (ref <5.7)

## 2012-03-18 NOTE — Progress Notes (Signed)
Quick Note:  Please notify patient that her labs came back positive for diabetes mellitus as evidenced by hemoglobin A1c of 7.2%. She needs to come back in to the office in the next couple weeks so that we can discuss treatment options. Please send her some information in the mail regarding diabetes diet and physical activity. Also she can pick up a glucose meter at a local drug store and testing strips and start monitoring her blood sugar at least once per day. She can bring those numbers in to the office for the next office visit in a couple weeks. Her cholesterol is suboptimally controlled for a Patient that has diabetes mellitus. We can discuss this in more detail when she returns to discuss treatment for diabetes. Recommend rechecking her labs again in 3 months.  Rodney Langton, MD, CDE, FAAFP [facility] [place], [place]   ______

## 2012-04-06 ENCOUNTER — Other Ambulatory Visit: Payer: Self-pay | Admitting: Obstetrics and Gynecology

## 2012-04-06 DIAGNOSIS — Z1231 Encounter for screening mammogram for malignant neoplasm of breast: Secondary | ICD-10-CM

## 2012-04-09 ENCOUNTER — Encounter (HOSPITAL_COMMUNITY): Payer: Self-pay | Admitting: *Deleted

## 2012-04-21 ENCOUNTER — Encounter (HOSPITAL_COMMUNITY): Payer: Self-pay

## 2012-04-21 ENCOUNTER — Ambulatory Visit (HOSPITAL_COMMUNITY)
Admission: RE | Admit: 2012-04-21 | Discharge: 2012-04-21 | Disposition: A | Discharge status: Home / Self Care | Payer: No Typology Code available for payment source | Source: Ambulatory Visit | Attending: Obstetrics and Gynecology | Admitting: Obstetrics and Gynecology

## 2012-04-21 VITALS — BP 118/88 | Temp 97.7°F | Ht 65.0 in | Wt 264.6 lb

## 2012-04-21 DIAGNOSIS — Z1239 Encounter for other screening for malignant neoplasm of breast: Secondary | ICD-10-CM

## 2012-04-21 DIAGNOSIS — Z1231 Encounter for screening mammogram for malignant neoplasm of breast: Secondary | ICD-10-CM

## 2012-04-21 HISTORY — DX: Hyperlipidemia, unspecified: E78.5

## 2012-04-21 NOTE — Patient Instructions (Signed)
Taught patient how to perform BSE. Patient did not need a Pap smear today due to last Pap smear was 11/25/2011. Let her know that she will not need any further Pap smears due to history of a hysterectomy for benign reasons. Let her know will follow up with her on results within the next couple weeks by letter or phone.  Patient verbalized understanding. Patient escorted to mammography for a screening mammogram.

## 2012-04-21 NOTE — Progress Notes (Signed)
No complaints today.  Pap Smear:    Pap smear not completed today. Last Pap smear was 11/25/2011 at the free cervical cancer screening at the [facility] and normal. Per patient has no history of abnormal Pap smears. Patient has a history of a hysterectomy for benign reasons 10 years. Patient no longer needs Pap smears per ACOG and BCCCP guidelines. No Pap smear results in EPIC.  Physical exam: Breasts Breasts symmetrical. No skin abnormalities bilateral breasts. No nipple retraction bilateral breasts. No nipple discharge bilateral breasts. No lymphadenopathy. No lumps palpated bilateral breasts. No complaints of pain or tenderness on exam. Patient escorted to mammography for a screening mammogram.        Pelvic/Bimanual No Pap smear completed today since last Pap smear was 11/25/2011. Pap smear not indicated per BCCCP guidelines.

## 2012-06-15 ENCOUNTER — Encounter (HOSPITAL_COMMUNITY): Payer: Self-pay | Admitting: Emergency Medicine

## 2012-06-15 ENCOUNTER — Emergency Department (HOSPITAL_COMMUNITY)
Admission: EM | Admit: 2012-06-15 | Discharge: 2012-06-15 | Disposition: A | Discharge status: Home Health | Payer: No Typology Code available for payment source | Source: Home / Self Care

## 2012-06-15 DIAGNOSIS — E119 Type 2 diabetes mellitus without complications: Secondary | ICD-10-CM

## 2012-06-15 DIAGNOSIS — I10 Essential (primary) hypertension: Secondary | ICD-10-CM

## 2012-06-15 MED ORDER — ASPIRIN-DIPYRIDAMOLE ER 25-200 MG PO CP12: 1.0000 | ORAL_CAPSULE | Freq: Two times a day (BID) | ORAL | Status: DC

## 2012-06-15 NOTE — ED Provider Notes (Addendum)
History     CSN: [id number]  Arrival date & time 06/15/12  1008   First MD Initiated Contact with Patient 06/15/12 1104      Chief Complaint  Patient presents with  . Follow-up     HPI 57 year old female with history of hypertension, CVA, hypothyroidism and hyperlipidemia here for followup. She was seen a few months back and blood work done showed a hemoglobin A1c of 7.2% and dyslipidemia. She was called in for a followup. Patient today denies any headache, dizziness, blurry vision, chest pain, palpitations, shortness of breath, abdominal pain, nausea, vomiting, bowel or urinary symptoms.  informed being compliant with her diet and medications. Denies pain,  tingling or numbness of her extremities, polyuria or polydipsia I discussed the options of starting medications for her diabetes however patient refuses at this time and wants to get her weight under control and try dietary modifications and followup in 2 months. Past Medical History  Diagnosis Date  . Hypertension   . Stroke   . Thyroid disease   . Hyperlipidemia     Past Surgical History  Procedure Laterality Date  . Abdominal hysterectomy    . Tubal ligation    . Bladder suspension      Family History  Problem Relation Age of Onset  . Hypertension Mother   . Heart disease Maternal Grandmother   . Cancer Maternal Grandmother     ovarian  . Cancer Maternal Grandfather     History  Substance Use Topics  . Smoking status: Never Smoker   . Smokeless tobacco: Never Used  . Alcohol Use: No    OB History   Grav Para Term Preterm Abortions TAB SAB Ect Mult Living   6 5 5  1  1   5       Review of Systems As outlined in history of present illness Allergies  Pollen extract; Tomato; and Wool alcohol  Home Medications   Current Outpatient Rx  Name  Route  Sig  Dispense  Refill  . levothyroxine (SYNTHROID, LEVOTHROID) 137 MCG tablet   Oral   Take 1 tablet (137 mcg total) by mouth daily.   30 tablet   3   .  lisinopril-hydrochlorothiazide (PRINZIDE,ZESTORETIC) 20-25 MG per tablet   Oral   Take 1 tablet by mouth daily.   30 tablet   4   . polyethylene glycol (MIRALAX / GLYCOLAX) packet   Oral   Take 17 g by mouth daily.   14 each   3   . dipyridamole-aspirin (AGGRENOX) 200-25 MG per 12 hr capsule   Oral   Take 1 capsule by mouth 2 (two) times daily.   60 capsule   3     There were no vitals taken for this visit.  Physical Exam Middle aged  female in no acute distress HEENT: No pallor, moist oral mucosa Chest: Clear to auscultation bilaterally, no added sounds CVS: Normal S1-S2 Abdomen: Soft, nontender, nondistended Extremities: Warm, no edema [id number]  ED Course  Procedures (including critical care time)  Labs Reviewed - No data to display No results found.   1. Diabetes mellitus   2. Hypertension    New onset type 2 diabetes mellitus Hemoglobin A1c of 7.2. Patient refuses to start any medication at this time and wants to reduce her weight and undergo dietary modifications. She will follow up with [place] in 2 months and have her A1c be checked. Given his additional resource one diabetes and glycemic control  Dyslipidemia Mild induration in  the year. Recommend activity modifications and a followup with repeat labs in 2 months again.  History of CVA Continue Aggrenox.  hypothyroidism Continue Synthroid Check  TSH on next visit.    MDM  Followup in 2 months        Eddie North, MD 06/15/12 1811  Eddie North, MD 06/15/12 1610

## 2012-06-15 NOTE — ED Notes (Addendum)
Pt is here for a f/u and go over lab works and meds Also c/o constat pain on right leg/calf and right heel onset 1 month  She is alert and oriented w/no signs of acute distress.

## 2012-07-07 ENCOUNTER — Telehealth: Payer: Self-pay

## 2012-07-07 NOTE — Telephone Encounter (Signed)
I have reviewed the paperwork. As I have not seen this patient before, and Dr. Gonzella Lex has seen her just once, I suggest she make an appt to be seen in clinic so we can get the paperwork filled out for her accurately because it looks like a great program!   When she comes in, we can give her a new script for aggrenox for the other paper as well.  Thanks AW

## 2012-07-10 ENCOUNTER — Ambulatory Visit: Payer: No Typology Code available for payment source | Attending: Family Medicine | Admitting: Family Medicine

## 2012-07-10 ENCOUNTER — Encounter: Payer: Self-pay | Admitting: Family Medicine

## 2012-07-10 VITALS — BP 137/84 | HR 80 | Temp 98.4°F | Resp 18 | Ht 65.0 in | Wt 252.0 lb

## 2012-07-10 DIAGNOSIS — I639 Cerebral infarction, unspecified: Secondary | ICD-10-CM

## 2012-07-10 DIAGNOSIS — E119 Type 2 diabetes mellitus without complications: Secondary | ICD-10-CM | POA: Insufficient documentation

## 2012-07-10 DIAGNOSIS — Z8673 Personal history of transient ischemic attack (TIA), and cerebral infarction without residual deficits: Secondary | ICD-10-CM | POA: Insufficient documentation

## 2012-07-10 DIAGNOSIS — I6350 Cerebral infarction due to unspecified occlusion or stenosis of unspecified cerebral artery: Secondary | ICD-10-CM

## 2012-07-10 MED ORDER — ASPIRIN-DIPYRIDAMOLE ER 25-200 MG PO CP12: 1.0000 | ORAL_CAPSULE | Freq: Two times a day (BID) | ORAL | Status: DC

## 2012-07-10 NOTE — Progress Notes (Signed)
Patient presents to have paper work filled out for medication and for a camp that she is going to. Patient states she has questions about diabetes as she was just diagnosed with it 3 weeks ago.

## 2012-07-10 NOTE — Patient Instructions (Signed)

## 2012-07-13 NOTE — Progress Notes (Signed)
Subjective:     [name], female   DOB: 1955-09-01, 57 y.o.   MRN: [id number]  HPI  Pt here for paperwork and to discuss DM.   1 - paperwork - she is going to a camp with her mother who is visually impaiered. Both will be "campers". Pt needs health form filled out. Also requesting paper script for aggrenox so she can get it via the mail.   2 - DM - she was recently found to have DM2 on a screening a1c. She has opted to try lifestyle modifications to see if she can control her sugars that way rather than with meds. She denies fatigue, polyuria or polydipsia.    Review of Systems per hpi     Objective:   Physical Exam  Nursing note and vitals reviewed. Constitutional: She appears well-developed and well-nourished.  Cardiovascular: Normal rate, regular rhythm and normal heart sounds.   Pulmonary/Chest: Effort normal and breath sounds normal.  Skin: Skin is warm and dry.  Psychiatric: She has a normal mood and affect.       Assessment:     Stroke - Plan: dipyridamole-aspirin (AGGRENOX) 200-25 MG per 12 hr capsule  Diabetes - Plan: Ambulatory referral to diabetic education       Plan:     Paperwork - filled out. Looks like a Loss adjuster, chartered.   Script for aggrenox issued.   DM - discussed at length what diabetes is and in general how it is managed, both pharmacologically and via lifetyle. She still would  Like to give lifestyle a try so will refer her to DM/nutrition to help her devise a nutrition plan, learn about carbs, and obtain a meter.   Will have her rtc in 2 mos with her sugars log book to re-evaluate and see if she is going to need medication. If, in the meanwhile she starts feeling poorly in any way I have advised her to rtc for re-evaluation. Ed if acutely worse. Call with questions or concerns.

## 2012-07-30 ENCOUNTER — Encounter: Payer: Self-pay | Admitting: *Deleted

## 2012-07-30 ENCOUNTER — Encounter: Payer: No Typology Code available for payment source | Attending: Family Medicine | Admitting: *Deleted

## 2012-07-30 VITALS — Ht 65.0 in | Wt 255.6 lb

## 2012-07-30 DIAGNOSIS — E119 Type 2 diabetes mellitus without complications: Secondary | ICD-10-CM | POA: Insufficient documentation

## 2012-07-30 DIAGNOSIS — Z713 Dietary counseling and surveillance: Secondary | ICD-10-CM | POA: Insufficient documentation

## 2012-07-30 DIAGNOSIS — IMO0001 Reserved for inherently not codable concepts without codable children: Secondary | ICD-10-CM

## 2012-07-30 NOTE — Patient Instructions (Addendum)
Goals:  Follow Diabetes Meal Plan as instructed  Eat 3 meals and 2 snacks, every 3-5 hrs  Limit carbohydrate intake to 30-45 grams carbohydrate/meal  Limit carbohydrate intake to 15 grams carbohydrate/snack  Add lean protein foods to meals/snacks  Monitor glucose levels as instructed by your doctor  Aim for 30 mins of physical activity daily  Bring food record and glucose log to your next nutrition visit 

## 2012-07-30 NOTE — Progress Notes (Signed)
  Patient was seen on 07/30/2012 for the first of a series of three diabetes self-management courses at the [facility]. The following learning objectives were met by the patient during this course:   Defines the role of glucose and insulin  Identifies type of diabetes and pathophysiology  Defines the diagnostic criteria for diabetes and prediabetes  States the risk factors for Type 2 Diabetes  States the symptoms of Type 2 Diabetes  Defines Type 2 Diabetes treatment goals  Defines Type 2 Diabetes treatment options  States the rationale for glucose monitoring  Identifies A1C, glucose targets, and testing times  Identifies proper sharps disposal  Defines the purpose of a diabetes food plan  Identifies carbohydrate food groups  Defines effects of carbohydrate foods on glucose levels  Identifies carbohydrate choices/grams/food labels  States benefits of physical activity and effect on glucose  Review of suggested activity guidelines  Handouts given during class include:  Type 2 Diabetes: Basics Book  My Food Plan Book  Food and Activity Log  Follow-Up Plan: Attend Core 2 and 3 class.

## 2012-08-07 ENCOUNTER — Telehealth: Payer: Self-pay | Admitting: Family Medicine

## 2012-08-07 ENCOUNTER — Telehealth: Payer: Self-pay | Admitting: *Deleted

## 2012-08-07 ENCOUNTER — Encounter: Payer: No Typology Code available for payment source | Admitting: *Deleted

## 2012-08-07 ENCOUNTER — Other Ambulatory Visit: Payer: Self-pay | Admitting: Family Medicine

## 2012-08-07 DIAGNOSIS — E119 Type 2 diabetes mellitus without complications: Secondary | ICD-10-CM

## 2012-08-07 DIAGNOSIS — I639 Cerebral infarction, unspecified: Secondary | ICD-10-CM

## 2012-08-07 MED ORDER — ASPIRIN-DIPYRIDAMOLE ER 25-200 MG PO CP12: 1.0000 | ORAL_CAPSULE | Freq: Two times a day (BID) | ORAL | Status: DC

## 2012-08-07 NOTE — Progress Notes (Signed)
  Patient was seen on 08/07/12 for the second of a series of three diabetes self-management courses at the [facility]. The following learning objectives were met by the patient during this course:   Explain basic nutrition maintenance and quality assurance  Describe causes, symptoms and treatment of hypoglycemia and hyperglycemia  Explain how to manage diabetes during illness  Describe the importance of good nutrition for health and healthy eating strategies  List strategies to follow meal plan when dining out  Describe the effects of alcohol on glucose and how to use it safely  Describe problem solving skills for day-to-day glucose challenges  Describe strategies to use when treatment plan needs to change  Identify important factors involved in successful weight loss  Describe ways to remain physically active  Describe the impact of regular activity on insulin resistance   Handouts given in class:  Refrigerator magnet for Sick Day Guidelines  [facility] Oral medication/insulin handout  Follow-Up Plan: Patient will attend the final class of the ADA Diabetes Self-Care Education.   

## 2012-08-07 NOTE — Telephone Encounter (Signed)
Pt needs printed script of dipyridamole-aspirin (AGGRENOX) 200-25 MG per 12 hr capsule given on 07/10/12 because pt never filled it and it has expired.

## 2012-08-07 NOTE — Telephone Encounter (Signed)
Patient called in requesting a new prescription of Aggrenox medication because she never had it filled when it was originally written.  I ordered for a new prescription to be printed and patient will come pick up prescription.   Rodney Langton, MD, CDE, FAAFP [facility] [place], [place]

## 2012-08-07 NOTE — Telephone Encounter (Signed)
08/07/12 Patient  Walked into clinic requesting refill on Aggrenox Dr. Laural Benes aware and has electronically Sent an prescription to [facility] [address] per patient request. P.Medea Deines,RN BSN [facility]

## 2012-08-10 ENCOUNTER — Ambulatory Visit: Payer: No Typology Code available for payment source | Attending: Family Medicine

## 2012-08-10 DIAGNOSIS — E119 Type 2 diabetes mellitus without complications: Secondary | ICD-10-CM

## 2012-08-10 LAB — LIPID PANEL
HDL: 50 mg/dL (ref >39)
LDL Cholesterol: 99 mg/dL (ref 0–99)

## 2012-08-10 LAB — POCT GLYCOSYLATED HEMOGLOBIN (HGB A1C): Hemoglobin A1C: 6.5

## 2012-08-10 NOTE — Progress Notes (Signed)
Quick Note:  Please inform patient that her A1c has improved to 6.5% which is down from when it was checked in February. Please continue to monitor blood sugar and follow diet. Great Job.    Rodney Langton, MD, CDE, FAAFP [facility] [place], [place]   ______

## 2012-08-10 NOTE — Progress Notes (Unsigned)
Clinical Social Worker met with patient in order to assess medication needs and provide support. Patient has begun application to have medication covered CSW called other pharmacies to see if medication is available at a reduced rate.  CSW still waiting to hear from MAP program.  CSW will follow as needed if MAP has an opportunity for this patient.   Beverly Sessions MSW, [place] [phone] Duration: 30 min

## 2012-08-11 ENCOUNTER — Telehealth: Payer: Self-pay | Admitting: *Deleted

## 2012-08-11 NOTE — Telephone Encounter (Signed)
08/11/12 Patient made aware that A1c has improved keep up the good work  P. [facility] BSN [facility]

## 2012-08-12 ENCOUNTER — Ambulatory Visit: Payer: No Typology Code available for payment source | Attending: Family Medicine | Admitting: Internal Medicine

## 2012-08-12 VITALS — BP 147/72 | HR 85 | Temp 98.1°F | Resp 16 | Ht 65.0 in | Wt 258.4 lb

## 2012-08-12 DIAGNOSIS — M79609 Pain in unspecified limb: Secondary | ICD-10-CM

## 2012-08-12 DIAGNOSIS — M79671 Pain in right foot: Secondary | ICD-10-CM

## 2012-08-12 DIAGNOSIS — E119 Type 2 diabetes mellitus without complications: Secondary | ICD-10-CM | POA: Insufficient documentation

## 2012-08-12 NOTE — Progress Notes (Signed)
Quick Note:  Patient that her cholesterol levels came back showing much better improvement. Her LDL cholesterol is 99. Her thyroid tests came back within normal limits.  Rodney Langton, MD, CDE, FAAFP [facility] [place], [place]   ______

## 2012-08-12 NOTE — Patient Instructions (Signed)
Achilles Tendinitis  Tendinitis a swelling and soreness of the tendon. The pain in the tendon (cord-like structure which attaches muscle to bone) is produced by tiny tears and the inflammation present in that tendon. It commonly occurs at the shoulders, heels, and elbows. It is usually caused by overusing the tendon and joint involved. Achilles tendinitis involves the Achilles tendon. This is the large tendon in the back of the leg just above the foot. It attaches the large muscles of the lower leg to the heel bone (called calcaneus).   This diagnosis (learning what is wrong) is made by examination. X-rays will be generally be normal if only tendinitis is present.  HOME CARE INSTRUCTIONS    Apply ice to the injury for 15-20 minutes, 3-4 times per day. Put the ice in a plastic bag and place a towel between the bag of ice and your skin.   Try to avoid use other than gentle range of motion while the tendon is painful. Do not resume use until instructed by your caregiver. Then begin use gradually. Do not increase use to the point of pain. If pain does develop, decrease use and continue the above measures. Gradually increase activities that do not cause discomfort until you gradually achieve normal use.   Only take over-the-counter or prescription medicines for pain, discomfort, or fever as directed by your caregiver.  SEEK MEDICAL CARE IF:    Your pain and swelling increase or pain is uncontrolled with medications.   You develop new, unexplained problems (symptoms) or an increase of the symptoms that brought you to your caregiver.   You develop an inability to move your toes or foot, develop warmth and swelling in your foot, or begin running an unexplained temperature.  MAKE SURE YOU:    Understand these instructions.   Will watch your condition.   Will get help right away if you are not doing well or get worse.  Document Released: 11/07/2004 Document Revised: 04/22/2011 Document Reviewed: 09/16/2007  [employer]  Patient Information 2014 [employer].

## 2012-08-12 NOTE — Progress Notes (Signed)
[name], female   DOB: 23-Sep-1955, 57 y.o.   MRN: [id number]  CC:  Follow up  HPI: Patient is 57 year old female who presents to clinic for followup on diabetes. She reports exercising and watching her diet, she reports compliance with recommende regimen of exercise and diet. She denies fevers and chills, no chest pain or shortness of breath, no double or urinary concerns, no recent sicknesses or hospitalizations.  Allergies  Allergen Reactions  . Pollen Extract   . Tomato   . Wool Alcohol (Lanolin)    Past Medical History  Diagnosis Date  . Hypertension   . Stroke   . Thyroid disease   . Hyperlipidemia   . Diabetes mellitus without complication    Current Outpatient Prescriptions on File Prior to Visit  Medication Sig Dispense Refill  . dipyridamole-aspirin (AGGRENOX) 200-25 MG per 12 hr capsule Take 1 capsule by mouth 2 (two) times daily.  60 capsule  4  . levothyroxine (SYNTHROID, LEVOTHROID) 137 MCG tablet Take 1 tablet (137 mcg total) by mouth daily.  30 tablet  3  . lisinopril-hydrochlorothiazide (PRINZIDE,ZESTORETIC) 20-25 MG per tablet Take 1 tablet by mouth daily.  30 tablet  4  . polyethylene glycol (MIRALAX / GLYCOLAX) packet Take 17 g by mouth daily.  14 each  3  . [DISCONTINUED] amLODipine (NORVASC) 10 MG tablet Take 10 mg by mouth daily.      . [DISCONTINUED] hydrochlorothiazide (HYDRODIURIL) 25 MG tablet Take 25 mg by mouth daily.       No current facility-administered medications on file prior to visit.   Family History  Problem Relation Age of Onset  . Hypertension Mother   . Heart disease Maternal Grandmother   . Cancer Maternal Grandmother     ovarian  . Cancer Maternal Grandfather   . Hypertension Other   . Hyperlipidemia Other   . Cancer Other   . Sleep apnea Other   . Obesity Other    History   Social History  . Marital Status: Single    Spouse Name: N/A    Number of Children: N/A  . Years of Education: N/A   Occupational  History  . Not on file.   Social History Main Topics  . Smoking status: Never Smoker   . Smokeless tobacco: Never Used  . Alcohol Use: No  . Drug Use: No  . Sexually Active: Yes    Birth Control/ Protection: Surgical   Other Topics Concern  . Not on file   Social History Narrative  . No narrative on file    Review of Systems  Constitutional: Negative for fever, chills, diaphoresis, activity change, appetite change and fatigue.  HENT: Negative for ear pain, nosebleeds, congestion, facial swelling, rhinorrhea, neck pain, neck stiffness and ear discharge.   Eyes: Negative for pain, discharge, redness, itching and visual disturbance.  Respiratory: Negative for cough, choking, chest tightness, shortness of breath, wheezing and stridor.   Cardiovascular: Negative for chest pain, palpitations and leg swelling.  Gastrointestinal: Negative for abdominal distention.  Genitourinary: Negative for dysuria, urgency, frequency, hematuria, flank pain, decreased urine volume, difficulty urinating and dyspareunia.  Musculoskeletal: Negative for back pain, joint swelling, arthralgias and gait problem.  Neurological: Negative for dizziness, tremors, seizures, syncope, facial asymmetry, speech difficulty, weakness, light-headedness, numbness and headaches.  Hematological: Negative for adenopathy. Does not bruise/bleed easily.  Psychiatric/Behavioral: Negative for hallucinations, behavioral problems, confusion, dysphoric mood, decreased concentration and agitation.    Objective:   Filed Vitals:   08/12/12 1031  BP: 147/72  Pulse: 85  Temp: 98.1 F (36.7 C)  Resp: 16    Physical Exam  Constitutional: Appears well-developed and well-nourished. No distress.  CVS: RRR, S1/S2 +, no murmurs, no gallops, no carotid bruit.  Pulmonary: Effort and breath sounds normal, no stridor, rhonchi, wheezes, rales.  Abdominal: Soft. BS +,  no distension, tenderness, rebound or guarding.  Musculoskeletal:  Normal range of motion. No edema and no tenderness.  Psychiatric: Normal mood and affect. Behavior, judgment, thought content normal.   Lab Results  Component Value Date   WBC 6.0 03/02/2010   HGB 12.5 03/02/2010   HCT 42.3 03/02/2010   MCV 96.8 03/02/2010   PLT 345 03/02/2010   Lab Results  Component Value Date   CREATININE 0.77 03/17/2012   BUN 15 03/17/2012   NA 139 03/17/2012   K 4.4 03/17/2012   CL 100 03/17/2012   CO2 32 03/17/2012    Lab Results  Component Value Date   HGBA1C 6.5 08/10/2012   Lipid Panel     Component Value Date/Time   CHOL 160 08/10/2012 0939   TRIG 53 08/10/2012 0939   HDL 50 08/10/2012 0939   CHOLHDL 3.2 08/10/2012 0939   VLDL 11 08/10/2012 0939   LDLCALC 99 08/10/2012 0939       Assessment and plan:   Patient Active Problem List   Diagnosis Date Noted  . Diabetes - well controlled, A1c dropped from 7.5-6.5, encouraged exercise and dietary restrictions as she is already following. Followup in 3 months recommended  08/10/2012  Right foot pain - appears to be related to inflammation of Achilles tendinitis, patient is able to rotate fluids with no significant difficulty but explains this happens on occasions, referral to orthopedic specialist will be provided today

## 2012-08-12 NOTE — Progress Notes (Signed)
Pt here f/u  Post diag  DIABETES 2 mnths ago. Also c/o yeast under abdominal fold/groin area. Denies vag d/c or pain.

## 2012-08-20 ENCOUNTER — Encounter: Payer: No Typology Code available for payment source | Attending: Family Medicine | Admitting: *Deleted

## 2012-08-20 DIAGNOSIS — E119 Type 2 diabetes mellitus without complications: Secondary | ICD-10-CM | POA: Insufficient documentation

## 2012-08-20 DIAGNOSIS — Z713 Dietary counseling and surveillance: Secondary | ICD-10-CM | POA: Insufficient documentation

## 2012-08-20 NOTE — Progress Notes (Signed)
Patient was seen on 08/20/12 for the third of a series of three diabetes self-management courses at the [facility]. The following learning objectives were met by the patient during this course:   Describe how diabetes changes over time  Identify diabetes complications and ways to prevent them  Describe strategies that can promote heart health including lowering blood pressure and cholesterol  Describe strategies to lower dietary fat and sodium in the diet  Identify physical activities that benefit cardiovascular health  Evaluate success in meeting personal goal  Describe the belief that they can live successfully with diabetes day to day  Establish 2-3 goals that they will plan to diligently work on until they return for the free 24-month follow-up visit  The following handouts were given in class:  3 Month Follow Up Visit handout  Goal setting handout  Class evaluation form  Your patient has established the following 3 month goals for diabetes self-care:  Count carbohydrates at most meals and snacks Be active 30 minute or more 5 times a week To help manage stress I will go to a happy place every day  Follow-Up Plan:  Patient will attend a 3 month follow-up visit for diabetes self-management education.

## 2012-08-31 ENCOUNTER — Ambulatory Visit: Payer: Self-pay | Admitting: Sports Medicine

## 2012-09-01 ENCOUNTER — Telehealth: Payer: Self-pay | Admitting: *Deleted

## 2012-09-01 NOTE — Telephone Encounter (Signed)
09/01/12 Patient made aware that her cholesterol level came back much improved.  Also patient requesting a  Refill on her medication Synthroid and Lisinopril/HCTZ P.[facility] BSN [facility]

## 2012-09-07 ENCOUNTER — Telehealth: Payer: Self-pay | Admitting: Family Medicine

## 2012-09-07 NOTE — Telephone Encounter (Signed)
Pt says she spoke to nurse last week and is calling to know if refill for bp and tyroid meds was sent to her pharmacy.  Says she has not received a call back and pharmacy does not have refill scripts.

## 2012-09-08 ENCOUNTER — Telehealth: Payer: Self-pay | Admitting: *Deleted

## 2012-09-08 NOTE — Telephone Encounter (Signed)
09/08/12 Patient stated she received her refills on yesterday  For 30 days. P.Versie Soave,RN BSN MHA

## 2012-09-22 ENCOUNTER — Telehealth: Payer: Self-pay | Admitting: Internal Medicine

## 2012-09-22 NOTE — Telephone Encounter (Signed)
Pt says she need med refill.  Please f/u with pt.

## 2012-09-22 NOTE — Telephone Encounter (Signed)
09/22/12  Attempt to reach patient not available message left via telephone for patient to return call. P,Keyvin Rison,RN BSN [facility]

## 2012-09-28 ENCOUNTER — Other Ambulatory Visit: Payer: Self-pay | Admitting: Emergency Medicine

## 2012-09-28 DIAGNOSIS — I10 Essential (primary) hypertension: Secondary | ICD-10-CM

## 2012-09-28 MED ORDER — LISINOPRIL-HYDROCHLOROTHIAZIDE 20-25 MG PO TABS: 1.0000 | ORAL_TABLET | Freq: Every day | ORAL | Status: DC

## 2012-09-28 MED ORDER — LEVOTHYROXINE SODIUM 137 MCG PO TABS: 137.0000 ug | ORAL_TABLET | Freq: Every day | ORAL | Status: DC

## 2012-09-28 NOTE — Telephone Encounter (Signed)
Informed pt medications reordered.Nonnie Done RN

## 2012-09-29 ENCOUNTER — Ambulatory Visit: Payer: Self-pay | Admitting: Family Medicine

## 2012-09-29 ENCOUNTER — Ambulatory Visit: Payer: Self-pay | Attending: Family Medicine

## 2012-09-30 ENCOUNTER — Ambulatory Visit: Payer: Self-pay | Admitting: Sports Medicine

## 2012-10-05 ENCOUNTER — Telehealth: Payer: Self-pay | Admitting: Emergency Medicine

## 2012-10-05 NOTE — Telephone Encounter (Signed)
Spoke with pt to see if received medication. States meds are at [facility].- Nonnie Done RN

## 2012-11-10 ENCOUNTER — Telehealth: Payer: Self-pay | Admitting: Internal Medicine

## 2012-11-10 NOTE — Telephone Encounter (Signed)
Pt calling to schedule appt for labs to check thyroid levels and whether still pre-diabetic.  Says she normally has this done every 67-months.  Pt asking is she could have just a lab visit, without seeing the DR first, so that when she does come in for a DR visit it will be to get lab results and any prescribed medication. There are currently no orders in place for labs.  Pt is scheduled for office visit on 11/13/12.  Please f/u with pt and schedule lab appt if possible.

## 2012-11-13 ENCOUNTER — Ambulatory Visit: Payer: Self-pay

## 2012-11-24 ENCOUNTER — Encounter: Payer: No Typology Code available for payment source | Attending: Family Medicine | Admitting: *Deleted

## 2012-11-24 VITALS — Ht 65.0 in | Wt 253.7 lb

## 2012-11-24 DIAGNOSIS — E119 Type 2 diabetes mellitus without complications: Secondary | ICD-10-CM

## 2012-11-24 DIAGNOSIS — Z713 Dietary counseling and surveillance: Secondary | ICD-10-CM | POA: Insufficient documentation

## 2012-11-24 NOTE — Progress Notes (Signed)
  Patient was seen on 11/24/12 for their 3 month follow-up as a part of the diabetes self-management courses at the [facility]. The following learning objectives were met by your patient during this course:  Patient self reports the following:  Diabetes control has improved since diabetes self-management training: YES WITH EATING HABITS, BUT WITH STRESS IN LIFE, MORE DIFFICULT TO EAT RIGHT. Number of days blood glucose is >200: NOT TESTING LATELY, TOLD SHE DIDN'T NEED TO ANYMORE Last MD appointment for diabetes: JULY Changes in treatment plan: NO DIABETES MEDICATIONS USED Confidence with ability to manage diabetes: POOR DUE TO FRUSTRATION WITH MD OFFICE CARE Areas for improvement with diabetes self-care: START CHECKING BG 1-2 TIMES A WEEK AND GO BACK TO [employer] FOR EXERCISE Willingness to participate in diabetes support group: NOT DISCUSSED TODAY  Your patient has established the following 3 month goals for diabetes self-care:  Count carbohydrates at most meals and snacks - LIMITS STARCH INTAKE AND FRIED FOODS Be active 30 minute or more 5 times a week - ACTIVE WITH NEWBORN BABY, PLANS TO START WALKING SOON AND GO BACK TO [employer] AT THE [employer] To help manage stress I will go to a happy place every day - SHE IS SUCCESSFUL WITH THAT!   Please see Diabetes Flow sheet for findings related to patient's self-care.  Follow-Up Plan: Patient is eligible for a "free" 30 minute diabetes self-care appointment in the next year. Patient to call and schedule as needed.

## 2012-12-11 ENCOUNTER — Encounter: Payer: Self-pay | Admitting: Internal Medicine

## 2012-12-11 ENCOUNTER — Ambulatory Visit (HOSPITAL_COMMUNITY)
Admission: RE | Admit: 2012-12-11 | Discharge: 2012-12-11 | Disposition: A | Discharge status: Home / Self Care | Payer: No Typology Code available for payment source | Source: Ambulatory Visit | Attending: Internal Medicine | Admitting: Internal Medicine

## 2012-12-11 ENCOUNTER — Ambulatory Visit: Payer: No Typology Code available for payment source | Attending: Internal Medicine | Admitting: Internal Medicine

## 2012-12-11 VITALS — BP 154/75 | HR 70 | Temp 97.8°F | Resp 16 | Ht 65.0 in | Wt 253.0 lb

## 2012-12-11 DIAGNOSIS — M79609 Pain in unspecified limb: Secondary | ICD-10-CM | POA: Insufficient documentation

## 2012-12-11 DIAGNOSIS — Z23 Encounter for immunization: Secondary | ICD-10-CM

## 2012-12-11 DIAGNOSIS — E119 Type 2 diabetes mellitus without complications: Secondary | ICD-10-CM | POA: Insufficient documentation

## 2012-12-11 DIAGNOSIS — E1149 Type 2 diabetes mellitus with other diabetic neurological complication: Secondary | ICD-10-CM

## 2012-12-11 DIAGNOSIS — I6350 Cerebral infarction due to unspecified occlusion or stenosis of unspecified cerebral artery: Secondary | ICD-10-CM

## 2012-12-11 DIAGNOSIS — E785 Hyperlipidemia, unspecified: Secondary | ICD-10-CM | POA: Insufficient documentation

## 2012-12-11 DIAGNOSIS — Z8673 Personal history of transient ischemic attack (TIA), and cerebral infarction without residual deficits: Secondary | ICD-10-CM | POA: Insufficient documentation

## 2012-12-11 DIAGNOSIS — R599 Enlarged lymph nodes, unspecified: Secondary | ICD-10-CM | POA: Insufficient documentation

## 2012-12-11 DIAGNOSIS — I10 Essential (primary) hypertension: Secondary | ICD-10-CM | POA: Insufficient documentation

## 2012-12-11 DIAGNOSIS — I639 Cerebral infarction, unspecified: Secondary | ICD-10-CM

## 2012-12-11 DIAGNOSIS — E079 Disorder of thyroid, unspecified: Secondary | ICD-10-CM | POA: Insufficient documentation

## 2012-12-11 DIAGNOSIS — Z79899 Other long term (current) drug therapy: Secondary | ICD-10-CM | POA: Insufficient documentation

## 2012-12-11 DIAGNOSIS — K5900 Constipation, unspecified: Secondary | ICD-10-CM | POA: Insufficient documentation

## 2012-12-11 LAB — POCT GLYCOSYLATED HEMOGLOBIN (HGB A1C): Hemoglobin A1C: 6.2

## 2012-12-11 MED ORDER — ASPIRIN-DIPYRIDAMOLE ER 25-200 MG PO CP12: 1.0000 | ORAL_CAPSULE | Freq: Two times a day (BID) | ORAL | Status: DC

## 2012-12-11 MED ORDER — GLUCOSE BLOOD VI STRP: ORAL_STRIP | Status: DC

## 2012-12-11 MED ORDER — POLYETHYLENE GLYCOL 3350 17 G PO PACK: 17.0000 g | PACK | Freq: Two times a day (BID) | ORAL | Status: DC

## 2012-12-11 MED ORDER — LEVOTHYROXINE SODIUM 137 MCG PO TABS: 137.0000 ug | ORAL_TABLET | Freq: Every day | ORAL | Status: DC

## 2012-12-11 MED ORDER — ACCU-CHEK MULTICLIX LANCETS MISC: Status: DC

## 2012-12-11 MED ORDER — LISINOPRIL-HYDROCHLOROTHIAZIDE 20-25 MG PO TABS: 1.0000 | ORAL_TABLET | Freq: Every day | ORAL | Status: DC

## 2012-12-11 NOTE — Progress Notes (Signed)
Patient Demographics  Tiffany Strickland, is a 57 y.o. female  GNF:[id number]  QIO:[id number]  DOB - [birth year]  Chief Complaint  Patient presents with  . Follow-up        Subjective:   [name] today is here for a follow up visit. [name] is here for a follow up visit, she has been having intermittent right leg pain for the past 2 months. She also has been having issues with constipation. She is concerned whether her diabetes control is still within range, and claims that she checks her sugars twice a week at home and they have been in the low 100s range mostly.  Patient has No headache, No chest pain, No abdominal pain - No Nausea, No new weakness tingling or numbness, No Cough - SOB.   Objective:    Filed Vitals:   12/11/12 0910  BP: 154/75  Pulse: 70  Temp: 97.8 F (36.6 C)  TempSrc: Oral  Resp: 16  Height: 5\' 5"  (1.651 m)  Weight: 253 lb (114.76 kg)  SpO2: 96%     ALLERGIES:   Allergies  Allergen Reactions  . Pollen Extract   . Tomato   . Wool Alcohol [Lanolin]     PAST MEDICAL HISTORY: Past Medical History  Diagnosis Date  . Hypertension   . Stroke   . Thyroid disease   . Hyperlipidemia   . Diabetes mellitus without complication     MEDICATIONS AT HOME: Prior to Admission medications   Medication Sig Start Date End Date Taking? Authorizing Provider  dipyridamole-aspirin (AGGRENOX) 200-25 MG per 12 hr capsule Take 1 capsule by mouth 2 (two) times daily. 12/11/12  Yes Shanker Levora Dredge, MD  levothyroxine (SYNTHROID, LEVOTHROID) 137 MCG tablet Take 1 tablet (137 mcg total) by mouth daily. 12/11/12  Yes Shanker Levora Dredge, MD  lisinopril-hydrochlorothiazide (PRINZIDE,ZESTORETIC) 20-25 MG per tablet Take 1 tablet by mouth daily. 12/11/12  Yes Shanker Levora Dredge, MD  polyethylene glycol (MIRALAX / GLYCOLAX) packet Take 17 g by mouth 2 (two) times daily. 12/11/12   Shanker Levora Dredge, MD     Exam  General appearance :Awake, alert, not in any  distress. Speech Clear. Not toxic Looking HEENT: Atraumatic and Normocephalic, pupils equally reactive to light and accomodation Neck: supple, no JVD. No cervical lymphadenopathy.  Chest:Good air entry bilaterally, no added sounds  CVS: S1 S2 regular, no murmurs.  Abdomen: Bowel sounds present, Non tender and not distended with no gaurding, rigidity or rebound. Extremities: B/L Lower Ext shows non pitting edema, both legs are warm to touch Neurology: Awake alert, and oriented X 3, CN II-XII intact, Non focal Skin:No Rash Wounds:N/A    Data Review   CBC No results found for this basename: WBC, HGB, HCT, PLT, MCV, MCH, MCHC, RDW, NEUTRABS, LYMPHSABS, MONOABS, EOSABS, BASOSABS, BANDABS, BANDSABD,  in the last 168 hours  Chemistries   No results found for this basename: NA, K, CL, CO2, GLUCOSE, BUN, CREATININE, GFRCGP, CALCIUM, MG, AST, ALT, ALKPHOS, BILITOT,  in the last 168 hours ------------------------------------------------------------------------------------------------------------------ No results found for this basename: HGBA1C,  in the last 72 hours ------------------------------------------------------------------------------------------------------------------ No results found for this basename: CHOL, HDL, LDLCALC, TRIG, CHOLHDL, LDLDIRECT,  in the last 72 hours ------------------------------------------------------------------------------------------------------------------ No results found for this basename: TSH, T4TOTAL, FREET3, T3FREE, THYROIDAB,  in the last 72 hours ------------------------------------------------------------------------------------------------------------------ No results found for this basename: VITAMINB12, FOLATE, FERRITIN, TIBC, IRON, RETICCTPCT,  in the last 72 hours  Coagulation profile  No results found for this basename: INR, PROTIME,  in the last 168 hours    Assessment & Plan   Hypertension - Claims BP elevated today because she was in  to get here and from all the exertion-does not want me to increase her antihypertensive medications, I will continue her on current dozing off lisinopril and HCTZ and have her followup in one month. If her blood pressure during that time if still high, then we'll need to adjust her blood pressure medication  Diabetes - Last A1c is 6.5-with history of CVA he probably should be on metformin, patient very hesitant, recheck A1c today and decide whether or not to start on oral hypoglycemics  Addendum- point-of-care A1c is 6.2-we'll continue with diet and lifestyle modification  History of CVA - Continue Aggrenox - Last LDL of 99 in June of 2014, recheck early next year to see if still less than 100.  Right leg pain -? Etiology, I see with bilateral mild non-pitting edema in both her legs - Will order Doppler ultrasound- his follow at next visit  Constipation - Start scheduled MiraLax twice a day, if she starts having frequent stools, I have asked her to decrease it to once daily dosing.  Health Maintenance -Colonoscopy: Done in 2012-edematous polyp-needs a repeat in 2017 -Pap Smear: Status post hysterectomy -Mammogram: Done in March 2014, repeat in 2050 -Vaccinations:  -Influenza: We'll order today  Follow up in one month for BP check   The patient was given clear instructions to go to ER or return to medical center if symptoms don't improve, worsen or new problems develop. The patient verbalized understanding. The patient was told to call to get lab results if they haven't heard anything in the next week.

## 2012-12-11 NOTE — Progress Notes (Signed)
Bilateral lower extremity venous duplex:  No evidence of DVT, superficial thrombosis, or Baker's Cyst.   

## 2012-12-11 NOTE — Progress Notes (Signed)
Pt is here requesting lab work. She is following up on her diabetes care. Pt reports being constipated far 4 months  Pt need a medication refill.

## 2012-12-11 NOTE — Progress Notes (Signed)
Tiffany Strickland from vascular lab called over Patient was negative  For  Clot or DVT

## 2013-01-15 ENCOUNTER — Ambulatory Visit: Payer: No Typology Code available for payment source | Attending: Internal Medicine | Admitting: Internal Medicine

## 2013-01-15 ENCOUNTER — Ambulatory Visit (HOSPITAL_COMMUNITY)
Admission: RE | Admit: 2013-01-15 | Discharge: 2013-01-15 | Disposition: A | Discharge status: Home / Self Care | Payer: No Typology Code available for payment source | Source: Ambulatory Visit | Attending: Internal Medicine | Admitting: Internal Medicine

## 2013-01-15 ENCOUNTER — Encounter: Payer: Self-pay | Admitting: Internal Medicine

## 2013-01-15 VITALS — BP 134/73 | HR 49 | Temp 97.6°F | Resp 16 | Ht 65.0 in | Wt 256.0 lb

## 2013-01-15 DIAGNOSIS — I10 Essential (primary) hypertension: Secondary | ICD-10-CM

## 2013-01-15 DIAGNOSIS — E119 Type 2 diabetes mellitus without complications: Secondary | ICD-10-CM

## 2013-01-15 DIAGNOSIS — M5030 Other cervical disc degeneration, unspecified cervical region: Secondary | ICD-10-CM | POA: Insufficient documentation

## 2013-01-15 LAB — CREATININE KINASE MB: CK, MB: 1 ng/mL (ref 0.0–5.0)

## 2013-01-15 LAB — TROPONIN I: Troponin I: <0.01 ng/mL (ref <0.06)

## 2013-01-15 NOTE — Progress Notes (Signed)
[name], female   DOB: [birth year], 57 y.o.   MRN: [id number]  CC: chest pain  HPI: 57 year old female with past medical history of hypertension who presented to clinic for followup. She reports having chest pain for past couple of days, midsternal, nonradiating, intermittent, usually 5/10 in intensity and resolved on its own. Patient reports chest pain comes it at rest or with activity and is not related to food intake. No palpitations or shortness of breath. No fatigue. No fevers or chills. No cough.  Allergies  Allergen Reactions  . Pollen Extract   . Tomato   . Wool Alcohol [Lanolin]    Past Medical History  Diagnosis Date  . Hypertension   . Stroke   . Thyroid disease   . Hyperlipidemia   . Diabetes mellitus without complication    Current Outpatient Prescriptions on File Prior to Visit  Medication Sig Dispense Refill  . dipyridamole-aspirin (AGGRENOX) 200-25 MG per 12 hr capsule Take 1 capsule by mouth 2 (two) times daily.  60 capsule  4  . glucose blood (ACCU-CHEK AVIVA) test strip Use as instructed  100 each  12  . Lancets (ACCU-CHEK MULTICLIX) lancets Use as instructed  100 each  12  . levothyroxine (SYNTHROID, LEVOTHROID) 137 MCG tablet Take 1 tablet (137 mcg total) by mouth daily.  30 tablet  3  . lisinopril-hydrochlorothiazide (PRINZIDE,ZESTORETIC) 20-25 MG per tablet Take 1 tablet by mouth daily.  30 tablet  4  . polyethylene glycol (MIRALAX / GLYCOLAX) packet Take 17 g by mouth 2 (two) times daily.  100 each  3  . [DISCONTINUED] amLODipine (NORVASC) 10 MG tablet Take 10 mg by mouth daily.      . [DISCONTINUED] hydrochlorothiazide (HYDRODIURIL) 25 MG tablet Take 25 mg by mouth daily.       No current facility-administered medications on file prior to visit.   Family History  Problem Relation Age of Onset  . Hypertension Mother   . Heart disease Maternal Grandmother   . Cancer Maternal Grandmother     ovarian  . Cancer Maternal Grandfather   .  Hypertension Other   . Hyperlipidemia Other   . Cancer Other   . Sleep apnea Other   . Obesity Other    History   Social History  . Marital Status: Single    Spouse Name: N/A    Number of Children: N/A  . Years of Education: N/A   Occupational History  . Not on file.   Social History Main Topics  . Smoking status: Never Smoker   . Smokeless tobacco: Never Used  . Alcohol Use: No  . Drug Use: No  . Sexual Activity: Yes    Birth Control/ Protection: Surgical   Other Topics Concern  . Not on file   Social History Narrative  . No narrative on file    Review of Systems  Constitutional: Negative for fever, chills, diaphoresis, activity change, appetite change and fatigue.  HENT: Negative for ear pain, nosebleeds, congestion, facial swelling, rhinorrhea, neck pain, neck stiffness and ear discharge.   Eyes: Negative for pain, discharge, redness, itching and visual disturbance.  Respiratory: Negative for cough, choking, chest tightness, shortness of breath, wheezing and stridor.   Cardiovascular: positive for chest pain, no palpitations and leg swelling.  Gastrointestinal: Negative for abdominal distention.  Genitourinary: Negative for dysuria, urgency, frequency, hematuria, flank pain, decreased urine volume, difficulty urinating and dyspareunia.  Musculoskeletal: Negative for back pain, joint swelling, arthralgias and gait problem.  Neurological: Negative  for dizziness, tremors, seizures, syncope, facial asymmetry, speech difficulty, weakness, light-headedness, numbness and headaches.  Hematological: Negative for adenopathy. Does not bruise/bleed easily.  Psychiatric/Behavioral: Negative for hallucinations, behavioral problems, confusion, dysphoric mood, decreased concentration and agitation.    Objective:   Filed Vitals:   01/15/13 0924  BP: 134/73  Pulse: 49  Temp: 97.6 F (36.4 C)  Resp: 16    Physical Exam  Constitutional: Appears well-developed and  well-nourished. No distress.  HENT: Normocephalic. External right and left ear normal. Oropharynx is clear and moist.  Eyes: Conjunctivae and EOM are normal. PERRLA, no scleral icterus.  Neck: Normal ROM. Neck supple. No JVD. No tracheal deviation. No thyromegaly.  CVS: RRR, S1/S2 +, no murmurs, no gallops, no carotid bruit.  Pulmonary: Effort and breath sounds normal, no stridor, rhonchi, wheezes, rales.  Abdominal: Soft. BS +,  no distension, tenderness, rebound or guarding.  Musculoskeletal: Normal range of motion. No edema and no tenderness.  Lymphadenopathy: No lymphadenopathy noted, cervical, inguinal. Neuro: Alert. Normal reflexes, muscle tone coordination. No cranial nerve deficit. Skin: Skin is warm and dry. No rash noted. Not diaphoretic. No erythema. No pallor.  Psychiatric: Normal mood and affect. Behavior, judgment, thought content normal.   Lab Results  Component Value Date   WBC 6.0 03/02/2010   HGB 12.5 03/02/2010   HCT 42.3 03/02/2010   MCV 96.8 03/02/2010   PLT 345 03/02/2010   Lab Results  Component Value Date   CREATININE 0.77 03/17/2012   BUN 15 03/17/2012   NA 139 03/17/2012   K 4.4 03/17/2012   CL 100 03/17/2012   CO2 32 03/17/2012    Lab Results  Component Value Date   HGBA1C 6.2 12/11/2012   Lipid Panel     Component Value Date/Time   CHOL 160 08/10/2012 0939   TRIG 53 08/10/2012 0939   HDL 50 08/10/2012 0939   CHOLHDL 3.2 08/10/2012 0939   VLDL 11 08/10/2012 0939   LDLCALC 99 08/10/2012 0939       Assessment and plan:   Patient Active Problem List   Diagnosis Date Noted  . HTN (hypertension) 01/15/2013    Priority: High - We have discussed target BP range - I have advised pt to check BP regularly and to call us back if the numbers are higher than 140/90 - discussed the importance of compliance with medical therapy and diet  - continue current blood pressure medications - Due to complaints of chest pain will obtain one set of cardiac enzymes   . Neck  pain - obtain neck / sof tissue x ray 08/10/2012

## 2013-01-15 NOTE — Patient Instructions (Signed)

## 2013-01-15 NOTE — Progress Notes (Signed)
Pt is here following up on her diabetes. Pt reports having discomfort or some pain in her chest.

## 2013-01-27 ENCOUNTER — Ambulatory Visit: Payer: No Typology Code available for payment source

## 2013-02-16 ENCOUNTER — Telehealth: Payer: Self-pay

## 2013-02-19 ENCOUNTER — Ambulatory Visit: Payer: Self-pay

## 2013-03-12 ENCOUNTER — Ambulatory Visit: Payer: No Typology Code available for payment source | Attending: Internal Medicine

## 2013-03-12 ENCOUNTER — Telehealth: Payer: Self-pay | Admitting: Internal Medicine

## 2013-03-12 NOTE — Telephone Encounter (Signed)
Pt came in today to get her O.C renewed and to pick up paperwork that allows her to get scripts sent to her home; please f/u with pt

## 2013-03-19 ENCOUNTER — Encounter: Payer: Self-pay | Admitting: Internal Medicine

## 2013-04-02 ENCOUNTER — Other Ambulatory Visit: Payer: Self-pay | Admitting: Internal Medicine

## 2013-04-02 ENCOUNTER — Other Ambulatory Visit: Payer: Self-pay | Admitting: *Deleted

## 2013-04-02 DIAGNOSIS — I639 Cerebral infarction, unspecified: Secondary | ICD-10-CM

## 2013-04-02 DIAGNOSIS — I10 Essential (primary) hypertension: Secondary | ICD-10-CM

## 2013-04-02 MED ORDER — ASPIRIN-DIPYRIDAMOLE ER 25-200 MG PO CP12: 1.0000 | ORAL_CAPSULE | Freq: Two times a day (BID) | ORAL | Status: DC

## 2013-04-02 MED ORDER — LEVOTHYROXINE SODIUM 137 MCG PO TABS: 137.0000 ug | ORAL_TABLET | Freq: Every day | ORAL | Status: DC

## 2013-04-12 ENCOUNTER — Telehealth: Payer: Self-pay

## 2013-04-12 NOTE — Telephone Encounter (Signed)
[facility]ed [facility] Left message on machine for them to call me back

## 2013-04-14 ENCOUNTER — Telehealth: Payer: Self-pay

## 2013-04-14 NOTE — Telephone Encounter (Signed)
[facility] called to verify prescription for aggrenox Script was not signed by provider  Gave verbal authorization so that medication could be filled

## 2013-04-15 ENCOUNTER — Encounter: Payer: Self-pay | Admitting: Internal Medicine

## 2013-04-15 ENCOUNTER — Ambulatory Visit: Payer: No Typology Code available for payment source | Attending: Internal Medicine | Admitting: Internal Medicine

## 2013-04-15 VITALS — BP 148/82 | HR 86 | Temp 98.1°F | Resp 14 | Ht 65.0 in | Wt 260.0 lb

## 2013-04-15 DIAGNOSIS — Z8673 Personal history of transient ischemic attack (TIA), and cerebral infarction without residual deficits: Secondary | ICD-10-CM | POA: Insufficient documentation

## 2013-04-15 DIAGNOSIS — I10 Essential (primary) hypertension: Secondary | ICD-10-CM | POA: Insufficient documentation

## 2013-04-15 DIAGNOSIS — Z860100 Personal history of colon polyps, unspecified: Secondary | ICD-10-CM | POA: Insufficient documentation

## 2013-04-15 DIAGNOSIS — E119 Type 2 diabetes mellitus without complications: Secondary | ICD-10-CM | POA: Insufficient documentation

## 2013-04-15 DIAGNOSIS — E0500 Thyrotoxicosis with diffuse goiter without thyrotoxic crisis or storm: Secondary | ICD-10-CM | POA: Insufficient documentation

## 2013-04-15 DIAGNOSIS — E785 Hyperlipidemia, unspecified: Secondary | ICD-10-CM | POA: Insufficient documentation

## 2013-04-15 DIAGNOSIS — K625 Hemorrhage of anus and rectum: Secondary | ICD-10-CM | POA: Insufficient documentation

## 2013-04-15 DIAGNOSIS — K5900 Constipation, unspecified: Secondary | ICD-10-CM | POA: Insufficient documentation

## 2013-04-15 DIAGNOSIS — Z09 Encounter for follow-up examination after completed treatment for conditions other than malignant neoplasm: Secondary | ICD-10-CM | POA: Insufficient documentation

## 2013-04-15 LAB — CBC WITH DIFFERENTIAL/PLATELET
Basophils Absolute: 0.1 10*3/uL (ref 0.0–0.1)
Basophils Relative: 1 % (ref 0–1)
EOS PCT: 7 % — AB (ref 0–5)
Eosinophils Absolute: 0.4 10*3/uL (ref 0.0–0.7)
HEMATOCRIT: 40.2 % (ref 36.0–46.0)
Hemoglobin: 13.2 g/dL (ref 12.0–15.0)
LYMPHS ABS: 1.8 10*3/uL (ref 0.7–4.0)
LYMPHS PCT: 35 % (ref 12–46)
MCH: 29.7 pg (ref 26.0–34.0)
MCHC: 32.8 g/dL (ref 30.0–36.0)
MCV: 90.3 fL (ref 78.0–100.0)
Monocytes Absolute: 0.5 10*3/uL (ref 0.1–1.0)
Monocytes Relative: 10 % (ref 3–12)
Neutro Abs: 2.4 10*3/uL (ref 1.7–7.7)
Neutrophils Relative %: 47 % (ref 43–77)
PLATELETS: 343 10*3/uL (ref 150–400)
RBC: 4.45 MIL/uL (ref 3.87–5.11)
RDW: 13.8 % (ref 11.5–15.5)
WBC: 5 10*3/uL (ref 4.0–10.5)

## 2013-04-15 LAB — LIPID PANEL
Cholesterol: 164 mg/dL (ref 0–200)
HDL: 52 mg/dL (ref >39)
LDL CALC: 100 mg/dL — AB (ref 0–99)
Total CHOL/HDL Ratio: 3.2 Ratio
Triglycerides: 61 mg/dL (ref <150)
VLDL: 12 mg/dL (ref 0–40)

## 2013-04-15 LAB — COMPLETE METABOLIC PANEL WITH GFR
ALBUMIN: 3.9 g/dL (ref 3.5–5.2)
ALT: 18 U/L (ref 0–35)
AST: 14 U/L (ref 0–37)
Alkaline Phosphatase: 72 U/L (ref 39–117)
BUN: 11 mg/dL (ref 6–23)
CALCIUM: 9.4 mg/dL (ref 8.4–10.5)
CHLORIDE: 99 meq/L (ref 96–112)
CO2: 32 meq/L (ref 19–32)
Creat: 0.85 mg/dL (ref 0.50–1.10)
GFR, EST AFRICAN AMERICAN: 87 mL/min
GFR, Est Non African American: 75 mL/min
Glucose, Bld: 106 mg/dL — ABNORMAL HIGH (ref 70–99)
Potassium: 4.1 mEq/L (ref 3.5–5.3)
Sodium: 139 mEq/L (ref 135–145)
TOTAL PROTEIN: 7.1 g/dL (ref 6.0–8.3)
Total Bilirubin: 0.4 mg/dL (ref 0.2–1.2)

## 2013-04-15 LAB — POCT GLYCOSYLATED HEMOGLOBIN (HGB A1C): Hemoglobin A1C: 6.1

## 2013-04-15 MED ORDER — POLYETHYLENE GLYCOL 3350 17 G PO PACK: 17.0000 g | PACK | Freq: Two times a day (BID) | ORAL | Status: DC

## 2013-04-15 MED ORDER — LEVOTHYROXINE SODIUM 137 MCG PO TABS: 137.0000 ug | ORAL_TABLET | Freq: Every day | ORAL | Status: DC

## 2013-04-15 MED ORDER — LISINOPRIL-HYDROCHLOROTHIAZIDE 20-25 MG PO TABS: 1.0000 | ORAL_TABLET | Freq: Every day | ORAL | Status: DC

## 2013-04-15 NOTE — Progress Notes (Signed)
Pt is here following up on her HTN. Pt reports having some pain and rectal bleeding. She reports having tingling and cramps in her arms.

## 2013-04-15 NOTE — Patient Instructions (Signed)
High-Fiber Diet Fiber is found in fruits, vegetables, and grains. A high-fiber diet encourages the addition of more whole grains, legumes, fruits, and vegetables in your diet. The recommended amount of fiber for adult males is 38 g per day. For adult females, it is 25 g per day. Pregnant and lactating women should get 28 g of fiber per day. If you have a digestive or bowel problem, ask your caregiver for advice before adding high-fiber foods to your diet. Eat a variety of high-fiber foods instead of only a select few type of foods.  PURPOSE  To increase stool bulk.  To make bowel movements more regular to prevent constipation.  To lower cholesterol.  To prevent overeating. WHEN IS THIS DIET USED?  It may be used if you have constipation and hemorrhoids.  It may be used if you have uncomplicated diverticulosis (intestine condition) and irritable bowel syndrome.  It may be used if you need help with weight management.  It may be used if you want to add it to your diet as a protective measure against atherosclerosis, diabetes, and cancer. SOURCES OF FIBER  Whole-grain breads and cereals.  Fruits, such as apples, oranges, bananas, berries, prunes, and pears.  Vegetables, such as green peas, carrots, sweet potatoes, beets, broccoli, cabbage, spinach, and artichokes.  Legumes, such split peas, soy, lentils.  Almonds. FIBER CONTENT IN FOODS Starches and Grains / Dietary Fiber (g)  Cheerios, 1 cup / 3 g  Corn Flakes cereal, 1 cup / 0.7 g  Rice crispy treat cereal, 1 cup / 0.3 g  Instant oatmeal (cooked),  cup / 2 g  Frosted wheat cereal, 1 cup / 5.1 g  Brown, long-grain rice (cooked), 1 cup / 3.5 g  White, long-grain rice (cooked), 1 cup / 0.6 g  Enriched macaroni (cooked), 1 cup / 2.5 g Legumes / Dietary Fiber (g)  Baked beans (canned, plain, or vegetarian),  cup / 5.2 g  Kidney beans (canned),  cup / 6.8 g  Pinto beans (cooked),  cup / 5.5 g Breads and Crackers  / Dietary Fiber (g)  Plain or honey graham crackers, 2 squares / 0.7 g  Saltine crackers, 3 squares / 0.3 g  Plain, salted pretzels, 10 pieces / 1.8 g  Whole-wheat bread, 1 slice / 1.9 g  White bread, 1 slice / 0.7 g  Raisin bread, 1 slice / 1.2 g  Plain bagel, 3 oz / 2 g  Flour tortilla, 1 oz / 0.9 g  Corn tortilla, 1 small / 1.5 g  Hamburger or hotdog bun, 1 small / 0.9 g Fruits / Dietary Fiber (g)  Apple with skin, 1 medium / 4.4 g  Sweetened applesauce,  cup / 1.5 g  Banana,  medium / 1.5 g  Grapes, 10 grapes / 0.4 g  Orange, 1 small / 2.3 g  Raisin, 1.5 oz / 1.6 g  Melon, 1 cup / 1.4 g Vegetables / Dietary Fiber (g)  Green beans (canned),  cup / 1.3 g  Carrots (cooked),  cup / 2.3 g  Broccoli (cooked),  cup / 2.8 g  Peas (cooked),  cup / 4.4 g  Mashed potatoes,  cup / 1.6 g  Lettuce, 1 cup / 0.5 g  Corn (canned),  cup / 1.6 g  Tomato,  cup / 1.1 g Document Released: 01/28/2005 Document Revised: 07/30/2011 Document Reviewed: 05/02/2011 [facility] Patient Information 2014 [place], [place]. DASH Diet The DASH diet stands for "Dietary Approaches to Stop Hypertension." It is a healthy  eating plan that has been shown to reduce high blood pressure (hypertension) in as little as 14 days, while also possibly providing other significant health benefits. These other health benefits include reducing the risk of breast cancer after menopause and reducing the risk of type 2 diabetes, heart disease, colon cancer, and stroke. Health benefits also include weight loss and slowing kidney failure in patients with chronic kidney disease.  DIET GUIDELINES  Limit salt (sodium). Your diet should contain less than 1500 mg of sodium daily.  Limit refined or processed carbohydrates. Your diet should include mostly whole grains. Desserts and added sugars should be used sparingly.  Include small amounts of heart-healthy fats. These types of fats include nuts, oils, and  tub margarine. Limit saturated and trans fats. These fats have been shown to be harmful in the body. CHOOSING FOODS  The following food groups are based on a 2000 calorie diet. See your Registered Dietitian for individual calorie needs. Grains and Grain Products (6 to 8 servings daily)  Eat More Often: Whole-wheat bread, brown rice, whole-grain or wheat pasta, quinoa, popcorn without added fat or salt (air popped).  Eat Less Often: White bread, white pasta, white rice, cornbread. Vegetables (4 to 5 servings daily)  Eat More Often: Fresh, frozen, and canned vegetables. Vegetables may be raw, steamed, roasted, or grilled with a minimal amount of fat.  Eat Less Often/Avoid: Creamed or fried vegetables. Vegetables in a cheese sauce. Fruit (4 to 5 servings daily)  Eat More Often: All fresh, canned (in natural juice), or frozen fruits. Dried fruits without added sugar. One hundred percent fruit juice ( cup [237 mL] daily).  Eat Less Often: Dried fruits with added sugar. Canned fruit in light or heavy syrup. [employer], Fish, and Poultry (2 servings or less daily. One serving is 3 to 4 oz [85-114 g]).  Eat More Often: Ninety percent or leaner ground beef, tenderloin, sirloin. Round cuts of beef, chicken breast, [place] breast. All fish. Grill, bake, or broil your meat. Nothing should be fried.  Eat Less Often/Avoid: Fatty cuts of meat, [place], or chicken leg, thigh, or wing. Fried cuts of meat or fish. Dairy (2 to 3 servings)  Eat More Often: Low-fat or fat-free milk, low-fat plain or light yogurt, reduced-fat or part-skim cheese.  Eat Less Often/Avoid: Milk (whole, 2%).Whole milk yogurt. Full-fat cheeses. Nuts, Seeds, and Legumes (4 to 5 servings per week)  Eat More Often: All without added salt.  Eat Less Often/Avoid: Salted nuts and seeds, canned beans with added salt. Fats and Sweets (limited)  Eat More Often: Vegetable oils, tub margarines without trans fats, sugar-free gelatin.  Mayonnaise and salad dressings.  Eat Less Often/Avoid: Coconut oils, palm oils, butter, stick margarine, cream, half and half, cookies, candy, pie. FOR MORE INFORMATION The Dash Diet Eating Plan: www.dashdiet.org Document Released: 01/17/2011 Document Revised: 04/22/2011 Document Reviewed: 01/17/2011 [facility] Patient Information 2014 [place], [place]. Diabetes and Exercise Exercising regularly is important. It is not just about losing weight. It has many health benefits, such as:  Improving your overall fitness, flexibility, and endurance.  Increasing your bone density.  Helping with weight control.  Decreasing your body fat.  Increasing your muscle strength.  Reducing stress and tension.  Improving your overall health. People with diabetes who exercise gain additional benefits because exercise:  Reduces appetite.  Improves the body's use of blood sugar (glucose).  Helps lower or control blood glucose.  Decreases blood pressure.  Helps control blood lipids (such as cholesterol and triglycerides).  Improves the body's use of the hormone insulin by:  Increasing the body's insulin sensitivity.  Reducing the body's insulin needs.  Decreases the risk for heart disease because exercising:  Lowers cholesterol and triglycerides levels.  Increases the levels of good cholesterol (such as high-density lipoproteins [HDL]) in the body.  Lowers blood glucose levels. YOUR ACTIVITY PLAN  Choose an activity that you enjoy and set realistic goals. Your health care provider or diabetes educator can help you make an activity plan that works for you. You can break activities into 2 or 3 sessions throughout the day. Doing so is as good as one long session. Exercise ideas include:  Taking the dog for a walk.  Taking the stairs instead of the elevator.  Dancing to your favorite song.  Doing your favorite exercise with a friend. RECOMMENDATIONS FOR EXERCISING WITH TYPE 1 OR TYPE 2  DIABETES   Check your blood glucose before exercising. If blood glucose levels are greater than 240 mg/dL, check for urine ketones. Do not exercise if ketones are present.  Avoid injecting insulin into areas of the body that are going to be exercised. For example, avoid injecting insulin into:  The arms when playing tennis.  The legs when jogging.  Keep a record of:  Food intake before and after you exercise.  Expected peak times of insulin action.  Blood glucose levels before and after you exercise.  The type and amount of exercise you have done.  Review your records with your health care provider. Your health care provider will help you to develop guidelines for adjusting food intake and insulin amounts before and after exercising.  If you take insulin or oral hypoglycemic agents, watch for signs and symptoms of hypoglycemia. They include:  Dizziness.  Shaking.  Sweating.  Chills.  Confusion.  Drink plenty of water while you exercise to prevent dehydration or heat stroke. Body water is lost during exercise and must be replaced.  Talk to your health care provider before starting an exercise program to make sure it is safe for you. Remember, almost any type of activity is better than none. Document Released: 04/20/2003 Document Revised: 09/30/2012 Document Reviewed: 07/07/2012 [facility] Patient Information 2014 [place]. Hypertension As your heart beats, it forces blood through your arteries. This force is your blood pressure. If the pressure is too high, it is called hypertension (HTN) or high blood pressure. HTN is dangerous because you may have it and not know it. High blood pressure may mean that your heart has to work harder to pump blood. Your arteries may be narrow or stiff. The extra work puts you at risk for heart disease, stroke, and other problems.  Blood pressure consists of two numbers, a higher number over a lower, 110/72, for example. It is stated as "110  over 72." The ideal is below 120 for the top number (systolic) and under 80 for the bottom (diastolic). Write down your blood pressure today. You should pay close attention to your blood pressure if you have certain conditions such as:  Heart failure.  Prior heart attack.  Diabetes  Chronic kidney disease.  Prior stroke.  Multiple risk factors for heart disease. To see if you have HTN, your blood pressure should be measured while you are seated with your arm held at the level of the heart. It should be measured at least twice. A one-time elevated blood pressure reading (especially in the Emergency Department) does not mean that you need treatment. There may be conditions in  which the blood pressure is different between your right and left arms. It is important to see your caregiver soon for a recheck. Most people have essential hypertension which means that there is not a specific cause. This type of high blood pressure may be lowered by changing lifestyle factors such as:  Stress.  Smoking.  Lack of exercise.  Excessive weight.  Drug/tobacco/alcohol use.  Eating less salt. Most people do not have symptoms from high blood pressure until it has caused damage to the body. Effective treatment can often prevent, delay or reduce that damage. TREATMENT  When a cause has been identified, treatment for high blood pressure is directed at the cause. There are a large number of medications to treat HTN. These fall into several categories, and your caregiver will help you select the medicines that are best for you. Medications may have side effects. You should review side effects with your caregiver. If your blood pressure stays high after you have made lifestyle changes or started on medicines,   Your medication(s) may need to be changed.  Other problems may need to be addressed.  Be certain you understand your prescriptions, and know how and when to take your medicine.  Be sure to follow  up with your caregiver within the time frame advised (usually within two weeks) to have your blood pressure rechecked and to review your medications.  If you are taking more than one medicine to lower your blood pressure, make sure you know how and at what times they should be taken. Taking two medicines at the same time can result in blood pressure that is too low. SEEK IMMEDIATE MEDICAL CARE IF:  You develop a severe headache, blurred or changing vision, or confusion.  You have unusual weakness or numbness, or a faint feeling.  You have severe chest or abdominal pain, vomiting, or breathing problems. MAKE SURE YOU:   Understand these instructions.  Will watch your condition.  Will get help right away if you are not doing well or get worse. Document Released: 01/28/2005 Document Revised: 04/22/2011 Document Reviewed: 09/18/2007 [facility] Patient Information 2014 [place].

## 2013-04-16 LAB — URINALYSIS, COMPLETE
Bacteria, UA: NONE SEEN
Bilirubin Urine: NEGATIVE
Casts: NONE SEEN
Crystals: NONE SEEN
Glucose, UA: NEGATIVE mg/dL
Hgb urine dipstick: NEGATIVE
Ketones, ur: NEGATIVE mg/dL
LEUKOCYTES UA: NEGATIVE
Nitrite: NEGATIVE
PROTEIN: NEGATIVE mg/dL
SQUAMOUS EPITHELIAL / LPF: NONE SEEN
Specific Gravity, Urine: 1.018 (ref 1.005–1.030)
Urobilinogen, UA: 1 mg/dL (ref 0.0–1.0)
pH: 7.5 (ref 5.0–8.0)

## 2013-04-16 LAB — TSH: TSH: 0.335 u[IU]/mL — AB (ref 0.350–4.500)

## 2013-04-18 NOTE — Progress Notes (Signed)
[name], female   DOB: [birth year], 58 y.o.   MRN: [id number]   Alleta Avery, is a 58 y.o. female  [id number]  [id number]  DOB - [birth year]  Chief Complaint  Patient presents with  . Follow-up        Subjective:   Tiffany Strickland is a 58 y.o. female here today for a follow up visit. Patient is here for followup of her high blood pressure and diabetes. She is on medications as listed below. Her major complaint today is bleeding per rectum occasionally especially after defecation. Her last colonoscopy was 2012, which showed: 1) Pedunculated polyp in the sigmoid colon -removed 2) Diverticula, scattered 3) Otherwise nl colonoscopy. Histopathology shows tubulovillous adenomatous polyp, she was requested to repeat colonoscopy in 5 years. She denies any major change in bowel habit except that she has been constipated for a very long time and she uses MiraLAX. She has no significant risk factor for colon cancer, no significant family history. She does not smoke cigarette she does not drink alcohol. Patient has No headache, No chest pain, No abdominal pain - No Nausea, No new weakness tingling or numbness, No Cough - SOB.  No problems updated.  ALLERGIES: Allergies  Allergen Reactions  . Pollen Extract   . Tomato   . Wool Alcohol [Lanolin]     PAST MEDICAL HISTORY: Past Medical History  Diagnosis Date  . Hypertension   . Stroke   . Thyroid disease   . Hyperlipidemia   . Diabetes mellitus without complication     MEDICATIONS AT HOME: Prior to Admission medications   Medication Sig Start Date End Date Taking? Authorizing Provider  dipyridamole-aspirin (AGGRENOX) 200-25 MG per 12 hr capsule Take 1 capsule by mouth 2 (two) times daily. 04/02/13  Yes Angelica Chessman, MD  glucose blood (ACCU-CHEK AVIVA) test strip Use as instructed 12/11/12  Yes Shanker Kristeen Mans, MD  Lancets (ACCU-CHEK MULTICLIX) lancets Use as instructed 12/11/12  Yes Shanker Kristeen Mans, MD    levothyroxine (SYNTHROID, LEVOTHROID) 137 MCG tablet Take 1 tablet (137 mcg total) by mouth daily. 04/15/13  Yes Angelica Chessman, MD  lisinopril-hydrochlorothiazide (PRINZIDE,ZESTORETIC) 20-25 MG per tablet Take 1 tablet by mouth daily. 04/15/13  Yes Angelica Chessman, MD  polyethylene glycol (MIRALAX / GLYCOLAX) packet Take 17 g by mouth 2 (two) times daily. 04/15/13  Yes Angelica Chessman, MD     Objective:   Filed Vitals:   04/15/13 0932  BP: 148/82  Pulse: 86  Temp: 98.1 F (36.7 C)  TempSrc: Oral  Resp: 14  Height: 5\' 5"  (1.651 m)  Weight: 260 lb (117.935 kg)  SpO2: 94%    Exam General appearance : Awake, alert, not in any distress. Speech Clear. Not toxic looking HEENT: Atraumatic and Normocephalic, pupils equally reactive to light and accomodation Neck: supple, no JVD. No cervical lymphadenopathy.  Chest:Good air entry bilaterally, no added sounds  CVS: S1 S2 regular, no murmurs.  Abdomen: Bowel sounds present, Non tender and not distended with no gaurding, rigidity or rebound. Extremities: B/L Lower Ext shows no edema, both legs are warm to touch Neurology: Awake alert, and oriented X 3, CN II-XII intact, Non focal Skin:No Rash Wounds:N/A Rectal examination deferred Data Review  Assessment & Plan   1. HTN (hypertension)  - CBC with Differential - COMPLETE METABOLIC PANEL WITH GFR - Lipid panel - TSH - Urinalysis, Complete  Refill: - levothyroxine (SYNTHROID, LEVOTHROID) 137 MCG tablet; Take 1 tablet (137 mcg total) by mouth daily.  Dispense: 90  tablet; Refill: 3 - lisinopril-hydrochlorothiazide (PRINZIDE,ZESTORETIC) 20-25 MG per tablet; Take 1 tablet by mouth daily.  Dispense: 90 tablet; Refill: 3  2. Diabetes  - POCT glycosylated hemoglobin (Hb A1C) is 6.1% today  3. Rectal bleeding  - Ambulatory referral to Gastroenterology for colonoscopy  4. Unspecified constipation  - Ambulatory referral to Gastroenterology for colonoscopy - polyethylene glycol  (MIRALAX / GLYCOLAX) packet; Take 17 g by mouth 2 (two) times daily.  Dispense: 100 each; Refill: 3   Return in about 3 months (around 07/16/2013), or if symptoms worsen or fail to improve, for Routine Follow Up, Hemoglobin A1C and Follow up.  The patient was given clear instructions to go to ER or return to medical center if symptoms don't improve, worsen or new problems develop. The patient verbalized understanding. The patient was told to call to get lab results if they haven't heard anything in the next week.    Angelica Chessman, MD, [place], Karilyn Cota [facility] and [facility] [place], [place]

## 2013-04-22 ENCOUNTER — Encounter: Payer: Self-pay | Admitting: Internal Medicine

## 2013-04-27 ENCOUNTER — Telehealth: Payer: Self-pay | Admitting: Internal Medicine

## 2013-04-27 NOTE — Telephone Encounter (Signed)
Pt called regarding her results, please contact pt

## 2013-04-28 ENCOUNTER — Other Ambulatory Visit: Payer: Self-pay | Admitting: Internal Medicine

## 2013-04-28 DIAGNOSIS — Z1231 Encounter for screening mammogram for malignant neoplasm of breast: Secondary | ICD-10-CM

## 2013-04-29 NOTE — Telephone Encounter (Signed)
Patient not available Left message on voice mail to return our call 

## 2013-05-10 ENCOUNTER — Ambulatory Visit (HOSPITAL_COMMUNITY): Payer: No Typology Code available for payment source

## 2013-05-19 ENCOUNTER — Ambulatory Visit (HOSPITAL_COMMUNITY)
Admission: RE | Admit: 2013-05-19 | Discharge: 2013-05-19 | Disposition: A | Discharge status: Home / Self Care | Payer: Self-pay | Source: Ambulatory Visit | Attending: Internal Medicine | Admitting: Internal Medicine

## 2013-05-19 DIAGNOSIS — Z1231 Encounter for screening mammogram for malignant neoplasm of breast: Secondary | ICD-10-CM

## 2013-05-26 ENCOUNTER — Telehealth: Payer: Self-pay | Admitting: *Deleted

## 2013-05-26 NOTE — Telephone Encounter (Signed)
Pt is aware of her lab results.  

## 2013-05-26 NOTE — Telephone Encounter (Signed)
Message copied by Joan Mayans on Wed May 26, 2013  2:32 PM ------      Message from: Tresa Garter      Created: Tue May 25, 2013 11:46 PM       Please inform patient that herTest results are mostly within normal limit ------

## 2013-06-09 ENCOUNTER — Encounter: Payer: Self-pay | Admitting: Internal Medicine

## 2013-06-09 ENCOUNTER — Ambulatory Visit (INDEPENDENT_AMBULATORY_CARE_PROVIDER_SITE_OTHER): Payer: No Typology Code available for payment source | Admitting: Internal Medicine

## 2013-06-09 VITALS — BP 120/70 | HR 80 | Ht 65.0 in | Wt 258.0 lb

## 2013-06-09 DIAGNOSIS — K625 Hemorrhage of anus and rectum: Secondary | ICD-10-CM

## 2013-06-09 DIAGNOSIS — K5900 Constipation, unspecified: Secondary | ICD-10-CM

## 2013-06-09 DIAGNOSIS — K6289 Other specified diseases of anus and rectum: Secondary | ICD-10-CM

## 2013-06-09 DIAGNOSIS — Z86010 Personal history of colonic polyps: Secondary | ICD-10-CM

## 2013-06-09 MED ORDER — MOVIPREP 100 G PO SOLR: 1.0000 | Freq: Once | ORAL | Status: DC

## 2013-06-09 NOTE — Progress Notes (Signed)
HISTORY OF PRESENT ILLNESS:  Tiffany Strickland is a 57 y.o. female with a history of hypertension, hyperlipidemia, hypothyroidism, glucose intolerance, and prior stroke for which she is on Aggrenox. Patient also has a history of tubulovillous adenoma of the colon on colonoscopy February 2012. She recently received a recall letter. She presents today upon referral by her PCP regarding constipation, rectal pain, and rectal bleeding. Patient has noticed to 3 episodes of right red blood per rectum when wiping. Several days of rectal discomfort which has resolved. She takes MiraLax daily for constipation. Her bowel habits will fluctuate with diet. GI review of systems is otherwise negative except for gas and bloating at times. Her chronic medical problems are stable  REVIEW OF SYSTEMS:  All non-GI ROS negative except for sinus and allergy trouble, arthritis, back pain, cough, itching, muscle cramps, shortness of breath, sleeping problems, leg swelling  Past Medical History  Diagnosis Date  . Hypertension   . Stroke   . Thyroid disease   . Hyperlipidemia   . Diabetes mellitus without complication   . Colon polyp     adenomatous    Past Surgical History  Procedure Laterality Date  . Abdominal hysterectomy    . Tubal ligation    . Bladder suspension    . Knee surgery      Social History Tiffany Strickland  reports that she has never smoked. She has never used smokeless tobacco. She reports that she does not drink alcohol or use illicit drugs.  family history includes Cancer in her maternal grandfather, maternal grandmother, and other; Heart disease in her maternal grandmother; Hyperlipidemia in her other; Hypertension in her mother and other; Obesity in her other; Sleep apnea in her other.  Allergies  Allergen Reactions  . Pollen Extract   . Tomato   . Wool Alcohol [Lanolin]        PHYSICAL EXAMINATION: Vital signs: BP 120/70  Pulse 80  Ht 5\' 5"  (1.651 m)  Wt 258 lb (117.028 kg)  BMI  42.93 kg/m2  Constitutional: Obese, generally well-appearing, no acute distress Psychiatric: alert and oriented x3, cooperative Eyes: extraocular movements intact, anicteric, conjunctiva pink Mouth: oral pharynx moist, no lesions Neck: supple no lymphadenopathy Cardiovascular: heart regular rate and rhythm, no murmur Lungs: clear to auscultation bilaterally Abdomen: soft, obese, nontender, nondistended, no obvious ascites, no peritoneal signs, normal bowel sounds, no organomegaly Rectal: Deferred until colonoscopy Extremities: no lower extremity edema bilaterally Skin: no lesions on visible extremities Neuro: No focal deficits.   ASSESSMENT:  #1. Minor intermittent rectal bleeding. Likely due to fissure with complaints of pain. Now resolved. #2. History of advanced neoplasia (tubulovillous adenoma). Due for followup #3. Chronic stable constipation. Functional   PLAN:  #1. Surveillance colonoscopy.The nature of the procedure, as well as the risks, benefits, and alternatives were carefully and thoroughly reviewed with the patient. Ample time for discussion and questions allowed. The patient understood, was satisfied, and agreed to proceed. #2. Movi prep prescribed. Patient instructed on its use #3. Continue on Aggrenox #4. MiraLax as needed for constipation

## 2013-06-09 NOTE — Patient Instructions (Signed)
You have been scheduled for a colonoscopy with propofol. Please follow written instructions given to you at your visit today.  Please pick up your prep kit at the pharmacy within the next 1-3 days. If you use inhalers (even only as needed), please bring them with you on the day of your procedure.  

## 2013-06-23 ENCOUNTER — Ambulatory Visit: Payer: Self-pay | Admitting: *Deleted

## 2013-06-25 ENCOUNTER — Emergency Department (HOSPITAL_COMMUNITY)
Admission: EM | Admit: 2013-06-25 | Discharge: 2013-06-25 | Disposition: A | Discharge status: Home / Self Care | Payer: No Typology Code available for payment source | Attending: Emergency Medicine | Admitting: Emergency Medicine

## 2013-06-25 ENCOUNTER — Emergency Department (HOSPITAL_COMMUNITY): Payer: No Typology Code available for payment source

## 2013-06-25 ENCOUNTER — Encounter (HOSPITAL_COMMUNITY): Payer: Self-pay | Admitting: Emergency Medicine

## 2013-06-25 DIAGNOSIS — Z79899 Other long term (current) drug therapy: Secondary | ICD-10-CM | POA: Insufficient documentation

## 2013-06-25 DIAGNOSIS — S59909A Unspecified injury of unspecified elbow, initial encounter: Secondary | ICD-10-CM | POA: Insufficient documentation

## 2013-06-25 DIAGNOSIS — Z860100 Personal history of colon polyps, unspecified: Secondary | ICD-10-CM | POA: Insufficient documentation

## 2013-06-25 DIAGNOSIS — Z8673 Personal history of transient ischemic attack (TIA), and cerebral infarction without residual deficits: Secondary | ICD-10-CM | POA: Insufficient documentation

## 2013-06-25 DIAGNOSIS — Y939 Activity, unspecified: Secondary | ICD-10-CM | POA: Insufficient documentation

## 2013-06-25 DIAGNOSIS — M79643 Pain in unspecified hand: Secondary | ICD-10-CM

## 2013-06-25 DIAGNOSIS — R296 Repeated falls: Secondary | ICD-10-CM | POA: Insufficient documentation

## 2013-06-25 DIAGNOSIS — S59919A Unspecified injury of unspecified forearm, initial encounter: Secondary | ICD-10-CM

## 2013-06-25 DIAGNOSIS — E119 Type 2 diabetes mellitus without complications: Secondary | ICD-10-CM | POA: Insufficient documentation

## 2013-06-25 DIAGNOSIS — S6990XA Unspecified injury of unspecified wrist, hand and finger(s), initial encounter: Secondary | ICD-10-CM | POA: Insufficient documentation

## 2013-06-25 DIAGNOSIS — E079 Disorder of thyroid, unspecified: Secondary | ICD-10-CM | POA: Insufficient documentation

## 2013-06-25 DIAGNOSIS — Y929 Unspecified place or not applicable: Secondary | ICD-10-CM | POA: Insufficient documentation

## 2013-06-25 DIAGNOSIS — I10 Essential (primary) hypertension: Secondary | ICD-10-CM | POA: Insufficient documentation

## 2013-06-25 MED ORDER — IBUPROFEN 800 MG PO TABS: 800.0000 mg | ORAL_TABLET | Freq: Three times a day (TID) | ORAL | Status: DC

## 2013-06-25 NOTE — Progress Notes (Signed)
Orthopedic Tech Progress Note Patient Details:  Shakila Mak [birth year] [id number]  Ortho Devices Type of Ortho Device: Velcro wrist splint Ortho Device/Splint Location: rue Ortho Device/Splint Interventions: Application   Doreena Maulden 06/25/2013, 10:25 PM

## 2013-06-25 NOTE — ED Notes (Signed)
Ortho paged. 

## 2013-06-25 NOTE — ED Notes (Signed)
She fell onto her R hand about 1 month ago. She reports pain radiating from her hand up her entire R arm increasingly worse since

## 2013-06-25 NOTE — ED Provider Notes (Signed)
CSN: [id number]     Arrival date & time 06/25/13  1816 History   First MD Initiated Contact with Patient 06/25/13 1835     Chief Complaint  Patient presents with  . Arm Pain     (Consider location/radiation/quality/duration/timing/severity/associated sxs/prior Treatment) HPI Comments: Patient presents to the emergency department with chief complaint of constant, moderate right hand, wrist, and arm pain. She states that she suffered a mechanical fall about one month ago. She was never evaluated. She states that she's had persistent pain since the fall. It has been worsening. She is tried using Aleve with some relief. The pain is worsened with movement.  The history is provided by the patient. No language interpreter was used.    Past Medical History  Diagnosis Date  . Hypertension   . Stroke   . Thyroid disease   . Hyperlipidemia   . Diabetes mellitus without complication   . Colon polyp     adenomatous   Past Surgical History  Procedure Laterality Date  . Abdominal hysterectomy    . Tubal ligation    . Bladder suspension    . Knee surgery     Family History  Problem Relation Age of Onset  . Hypertension Mother   . Heart disease Maternal Grandmother   . Cancer Maternal Grandmother     ovarian  . Cancer Maternal Grandfather   . Hypertension Other   . Hyperlipidemia Other   . Cancer Other   . Sleep apnea Other   . Obesity Other    History  Substance Use Topics  . Smoking status: Never Smoker   . Smokeless tobacco: Never Used  . Alcohol Use: No   OB History   Grav Para Term Preterm Abortions TAB SAB Ect Mult Living   6 5 5  1  1   5     Review of Systems  Constitutional: Negative for fever and chills.  Respiratory: Negative for shortness of breath.   Cardiovascular: Negative for chest pain.  Gastrointestinal: Negative for nausea, vomiting, diarrhea and constipation.  Genitourinary: Negative for dysuria.  Musculoskeletal: Positive for arthralgias.       Allergies  Pollen extract; Tomato; and Wool alcohol  Home Medications   Prior to Admission medications   Medication Sig Start Date End Date Taking? Authorizing Provider  dipyridamole-aspirin (AGGRENOX) 200-25 MG per 12 hr capsule Take 1 capsule by mouth 2 (two) times daily. 04/02/13  Yes Olugbemiga Jegede, MD  glucose blood (ACCU-CHEK AVIVA) test strip Use as instructed 12/11/12  Yes Shanker M Ghimire, MD  Lancets (ACCU-CHEK MULTICLIX) lancets Use as instructed 12/11/12  Yes Shanker M Ghimire, MD  levothyroxine (SYNTHROID, LEVOTHROID) 137 MCG tablet Take 1 tablet (137 mcg total) by mouth daily. 04/15/13  Yes Olugbemiga Jegede, MD  lisinopril-hydrochlorothiazide (PRINZIDE,ZESTORETIC) 20-25 MG per tablet Take 1 tablet by mouth daily. 04/15/13  Yes Olugbemiga Jegede, MD  MOVIPREP 100 G SOLR Take 1 kit (200 g total) by mouth once. 06/09/13  Yes John N Perry, MD  polyethylene glycol (MIRALAX / GLYCOLAX) packet Take 17 g by mouth 2 (two) times daily. 04/15/13  Yes Olugbemiga Jegede, MD   BP 156/75  Pulse 72  Temp(Src) 98.8 F (37.1 C) (Oral)  Resp 17  Ht 5' 5" (1.651 m)  Wt 289 lb 4.8 oz (131.226 kg)  BMI 48.14 kg/m2  SpO2 100% Physical Exam  Nursing note and vitals reviewed. Constitutional: She is oriented to person, place, and time. She appears well-developed and well-nourished.  HENT:  Head:   Normocephalic and atraumatic.  Eyes: Conjunctivae and EOM are normal.  Neck: Normal range of motion.  Cardiovascular: Normal rate and intact distal pulses.   Intact distal pulses, with brisk capillary refill  Pulmonary/Chest: Effort normal.  Abdominal: She exhibits no distension.  Musculoskeletal: Normal range of motion.  Right hand, wrist, and elbow tender to palpation, no bony abnormality or deformity, range of motion and strength limited in the wrist secondary to pain, range of motion and strength 5/5 at the elbow  Neurological: She is alert and oriented to person, place, and time.   Sensation intact  Skin: Skin is dry.  Psychiatric: She has a normal mood and affect. Her behavior is normal. Judgment and thought content normal.    ED Course  Procedures (including critical care time) Labs Review Labs Reviewed - No data to display  Imaging Review Dg Forearm Right  06/25/2013   CLINICAL DATA:  Right forearm pain following a fall 1 month ago.  EXAM: RIGHT FOREARM - 2 VIEW  COMPARISON:  None.  FINDINGS: There is no evidence of fracture or other focal bone lesions. Soft tissues are unremarkable.  IMPRESSION: Normal examination.   Electronically Signed   By: Enrique Sack M.D.   On: 06/25/2013 21:43   Dg Wrist Complete Right  06/25/2013   CLINICAL DATA:  Right wrist pain following a fall 1 month ago.  EXAM: RIGHT WRIST - COMPLETE 3+ VIEW  COMPARISON:  Right hand radiographs obtained at the same time.  FINDINGS: There is no evidence of fracture or dislocation. There is no evidence of arthropathy or other focal bone abnormality. Soft tissues are unremarkable.  IMPRESSION: Normal examination.   Electronically Signed   By: Enrique Sack M.D.   On: 06/25/2013 21:48   Dg Hand Complete Right  06/25/2013   CLINICAL DATA:  Right hand pain following a fall 1 month ago.  EXAM: RIGHT HAND - COMPLETE 3+ VIEW  COMPARISON:  None.  FINDINGS: A portion of the fourth proximal phalanx is obscured by a ring. No fracture or dislocation seen.  IMPRESSION: No fracture.   Electronically Signed   By: Enrique Sack M.D.   On: 06/25/2013 21:42     EKG Interpretation None      MDM   Final diagnoses:  Hand pain    Patient with probable wrist sprain. Plain films are negative. Will splint the rest, and referred to hand. Patient understands and agrees to plan. She is stable and ready for discharge.    Montine Circle, PA-C 06/25/13 2158

## 2013-06-25 NOTE — Discharge Instructions (Signed)
Sprain  A sprain is a tear in one of the strong, fibrous tissues that connect your bones (ligaments). The severity of the sprain depends on how much of the ligament is torn. The tear can be either partial or complete.  CAUSES   Often, sprains are a result of a fall or an injury. The force of the impact causes the fibers of your ligament to stretch beyond their normal length. This excess tension causes the fibers of your ligament to tear.  SYMPTOMS   You may have some loss of motion or increased pain within your normal range of motion. Other symptoms include:  · Bruising.  · Tenderness.  · Swelling.  DIAGNOSIS   In order to diagnose a sprain, your caregiver will physically examine you to determine how torn the ligament is. Your caregiver may also suggest an X-ray exam to make sure no bones are broken.  TREATMENT   If your ligament is only partially torn, treatment usually involves keeping the injured area in a fixed position (immobilization) for a short period. To do this, your caregiver will apply a bandage, cast, or splint to keep the area from moving until it heals. For a partially torn ligament, the healing process usually takes 2 to 3 weeks.  If your ligament is completely torn, you may need surgery to reconnect the ligament to the bone or to reconstruct the ligament. After surgery, a cast or splint may be applied and will need to stay on for 4 to 6 weeks while your ligament heals.  HOME CARE INSTRUCTIONS  · Keep the injured area elevated to decrease swelling.  · To ease pain and swelling, apply ice to your joint twice a day, for 2 to 3 days.  · Put ice in a plastic bag.  · Place a towel between your skin and the bag.  · Leave the ice on for 15 minutes.  · Only take over-the-counter or prescription medicine for pain as directed by your caregiver.  · Do not leave the injured area unprotected until pain and stiffness go away (usually 3 to 4 weeks).  · Do not allow your cast or splint to get wet. Cover your cast or  splint with a plastic bag when you shower or bathe. Do not swim.  · Your caregiver may suggest exercises for you to do during your recovery to prevent or limit permanent stiffness.  SEEK IMMEDIATE MEDICAL CARE IF:  · Your cast or splint becomes damaged.  · Your pain becomes worse.  MAKE SURE YOU:  · Understand these instructions.  · Will watch your condition.  · Will get help right away if you are not doing well or get worse.  Document Released: 01/26/2000 Document Revised: 04/22/2011 Document Reviewed: 02/09/2011  Exit[employer]® Patient Information ©2014 [employer].

## 2013-06-26 NOTE — ED Provider Notes (Signed)
Medical screening examination/treatment/procedure(s) were performed by non-physician practitioner and as supervising physician I was immediately available for consultation/collaboration.  Richarda Blade, MD 06/26/13 0130

## 2013-07-13 ENCOUNTER — Telehealth: Payer: Self-pay | Admitting: Internal Medicine

## 2013-07-15 NOTE — Telephone Encounter (Signed)
LM on VM that I was leaving coupon up front for patient to pick up.  Requested she call me back if she was unable to come by.

## 2013-07-19 ENCOUNTER — Ambulatory Visit: Payer: No Typology Code available for payment source | Admitting: Internal Medicine

## 2013-07-21 ENCOUNTER — Ambulatory Visit (INDEPENDENT_AMBULATORY_CARE_PROVIDER_SITE_OTHER): Payer: Self-pay | Admitting: Emergency Medicine

## 2013-07-21 ENCOUNTER — Encounter: Payer: Self-pay | Admitting: Emergency Medicine

## 2013-07-21 VITALS — BP 120/77 | Ht 65.0 in | Wt 255.0 lb

## 2013-07-21 DIAGNOSIS — S63501A Unspecified sprain of right wrist, initial encounter: Secondary | ICD-10-CM | POA: Insufficient documentation

## 2013-07-21 DIAGNOSIS — M25511 Pain in right shoulder: Secondary | ICD-10-CM

## 2013-07-21 DIAGNOSIS — M7710 Lateral epicondylitis, unspecified elbow: Secondary | ICD-10-CM

## 2013-07-21 DIAGNOSIS — M7711 Lateral epicondylitis, right elbow: Secondary | ICD-10-CM

## 2013-07-21 DIAGNOSIS — S63509A Unspecified sprain of unspecified wrist, initial encounter: Secondary | ICD-10-CM

## 2013-07-21 DIAGNOSIS — M25519 Pain in unspecified shoulder: Secondary | ICD-10-CM

## 2013-07-21 MED ORDER — MELOXICAM 15 MG PO TABS: 15.0000 mg | ORAL_TABLET | Freq: Every day | ORAL | Status: DC

## 2013-07-21 NOTE — Assessment & Plan Note (Signed)
Daily MOBIC started as well as home exercise program with [employer]rab[employer].

## 2013-07-21 NOTE — Assessment & Plan Note (Signed)
She history of a recent right wrist sprain. She no longer has symptoms. I advised her that she may discontinue the splint. Try to encourage range of motion of the fingers is I suspect that stiffness from immobilization this was causing the MCP joint pain. Reassurance was given with respect to that as well.

## 2013-07-21 NOTE — Assessment & Plan Note (Signed)
Patient given home exercise program to encourage strengthening of the region. In addition started on daily MOBIC.

## 2013-07-21 NOTE — Progress Notes (Signed)
[name], female   DOB: 1956/01/25, 58 y.o.   MRN: [id number] 58 year old female who sustained a fall approximately 2 months ago spraining her wrist. She now presents with right MCP joint pain, right elbow pain and mild right shoulder pain. His all been going on since the fall. Minimal relief with oral ibuprofen. No new injuries. Strength is intact. Is not limiting her activities of daily living. She continues to wear the volar wrist splint. She no longer has any wrist pain.  Pertinent past medical history: Hypertension, diabetes  Social history: Nonsmoker  Review of systems as per history of present illness otherwise all systems negative  Examination BP 120/77  Ht 5\' 5"  (1.651 m)  Wt 255 lb (115.667 kg)  BMI 42.43 kg/m2 Well-developed 58 year old female awake alert and oriented in no acute distress  Right Shoulder: Inspection reveals no abnormalities, atrophy or asymmetry. Palpation is normal with no tenderness over AC joint or bicipital groove. ROM is full in all planes. Rotator cuff strength normal throughout. No signs of impingement with negative Neer and Hawkin's tests, empty can sign. Speeds and Yergason's tests normal. No labral pathology noted Normal scapular function observed. No painful arc and no drop arm sign. No apprehension sign  Right Elbow: Range of motion full pronation, supination, flexion, extension. Strength is full to all of the above directions Stable to varus, valgus stress. Negative moving valgus stress test. Mild tenderness palpation of the lateral epicondyle. Ulnar nerve does not sublux. Negative cubital tunnel Tinel's.  Right hand: Full range of motion with no obvious swelling. Neurovascularly intact.  X-rays of right hand were reviewed. No obvious fracture. No evidence of significant DJD.

## 2013-08-02 ENCOUNTER — Telehealth: Payer: Self-pay | Admitting: Internal Medicine

## 2013-08-02 NOTE — Telephone Encounter (Signed)
Pt. Is calling requesting refill for Aggrenox...please call patient

## 2013-08-03 ENCOUNTER — Other Ambulatory Visit: Payer: Self-pay | Admitting: *Deleted

## 2013-08-03 DIAGNOSIS — I639 Cerebral infarction, unspecified: Secondary | ICD-10-CM

## 2013-08-03 MED ORDER — ASPIRIN-DIPYRIDAMOLE ER 25-200 MG PO CP12: 1.0000 | ORAL_CAPSULE | Freq: Two times a day (BID) | ORAL | Status: DC

## 2013-08-03 NOTE — Telephone Encounter (Signed)
Called to inform patient her prescription is ready for pick up and is at the front desk. Patient verbalized understanding. Vivia Birmingham, RN

## 2013-08-06 ENCOUNTER — Encounter: Payer: Self-pay | Admitting: Internal Medicine

## 2013-08-06 ENCOUNTER — Ambulatory Visit (AMBULATORY_SURGERY_CENTER): Payer: Self-pay | Admitting: Internal Medicine

## 2013-08-06 VITALS — BP 127/62 | HR 66 | Temp 97.5°F | Resp 18 | Ht 65.0 in | Wt 258.0 lb

## 2013-08-06 DIAGNOSIS — Z86010 Personal history of colonic polyps: Secondary | ICD-10-CM

## 2013-08-06 MED ORDER — SODIUM CHLORIDE 0.9 % IV SOLN: 500.0000 mL | INTRAVENOUS | Status: DC

## 2013-08-06 NOTE — Patient Instructions (Signed)
YOU HAD AN ENDOSCOPIC PROCEDURE TODAY AT THE [facility]: Refer to the procedure report that was given to you for any specific questions about what was found during the examination.  If the procedure report does not answer your questions, please call your gastroenterologist to clarify.  If you requested that your care partner not be given the details of your procedure findings, then the procedure report has been included in a sealed envelope for you to review at your convenience later.  YOU SHOULD EXPECT: Some feelings of bloating in the abdomen. Passage of more gas than usual.  Walking can help get rid of the air that was put into your GI tract during the procedure and reduce the bloating. If you had a lower endoscopy (such as a colonoscopy or flexible sigmoidoscopy) you may notice spotting of blood in your stool or on the toilet paper. If you underwent a bowel prep for your procedure, then you may not have a normal bowel movement for a few days.  DIET: Your first meal following the procedure should be a light meal and then it is ok to progress to your normal diet.  A half-sandwich or bowl of soup is an example of a good first meal.  Heavy or fried foods are harder to digest and may make you feel nauseous or bloated.  Likewise meals heavy in dairy and vegetables can cause extra gas to form and this can also increase the bloating.  Drink plenty of fluids but you should avoid alcoholic beverages for 24 hours.  ACTIVITY: Your care partner should take you home directly after the procedure.  You should plan to take it easy, moving slowly for the rest of the day.  You can resume normal activity the day after the procedure however you should NOT DRIVE or use heavy machinery for 24 hours (because of the sedation medicines used during the test).    SYMPTOMS TO REPORT IMMEDIATELY: A gastroenterologist can be reached at any hour.  During normal business hours, 8:30 AM to 5:00 PM Monday through Friday,  call [phone].  After hours and on weekends, please call the GI answering service at [phone] who will take a message and have the physician on call contact you.   Following lower endoscopy (colonoscopy or flexible sigmoidoscopy):  Excessive amounts of blood in the stool  Significant tenderness or worsening of abdominal pains  Swelling of the abdomen that is new, acute  Fever of 100F or higher  FOLLOW UP: If any biopsies were taken you will be contacted by phone or by letter within the next 1-3 weeks.  Call your gastroenterologist if you have not heard about the biopsies in 3 weeks.  Our staff will call the home number listed on your records the next business day following your procedure to check on you and address any questions or concerns that you may have at that time regarding the information given to you following your procedure. This is a courtesy call and so if there is no answer at the home number and we have not heard from you through the emergency physician on call, we will assume that you have returned to your regular daily activities without incident.  SIGNATURES/CONFIDENTIALITY: You and/or your care partner have signed paperwork which will be entered into your electronic medical record.  These signatures attest to the fact that that the information above on your After Visit Summary has been reviewed and is understood.  Full responsibility of the confidentiality of this   discharge information lies with you and/or your care-partner.  Recommendations Follow up colonoscopy in 5 years Rectal Endoscopic ultrasound with Dr. Ardis Hughs

## 2013-08-06 NOTE — Op Note (Signed)
[facility]  [address] [place], [address]   COLONOSCOPY PROCEDURE REPORT  PATIENT: Tiffany Strickland, Tiffany Strickland  MR#: [id number] BIRTHDATE: Feb 16, 1955 , 62  yrs. old GENDER: Female ENDOSCOPIST: Eustace Quail, MD REFERRED GB:[id number] Program Recall PROCEDURE DATE:  08/06/2013 PROCEDURE:   Colonoscopy, surveillance First Screening Colonoscopy - Avg.  risk and is 50 yrs.  old or older - No.  Prior Negative Screening - Now for repeat screening. N/A  History of Adenoma - Now for follow-up colonoscopy & has been > or = to 3 yrs.  Yes hx of adenoma.  Has been 3 or more years since last colonoscopy.  Polyps Removed Today? No.  Recommend repeat exam, <10 yrs? Yes.  High risk (family or personal hx). ASA CLASS:   Class II INDICATIONS:Patient's personal history of adenomatous colon polyps. Index 03-2010 with TVA MEDICATIONS: MAC sedation, administered by CRNA and propofol (Diprivan) 250mg  IV DESCRIPTION OF PROCEDURE:   After the risks benefits and alternatives of the procedure were thoroughly explained, informed consent was obtained.  A digital rectal exam revealed palpable firm nodule.   The LB [id number]  endoscope was introduced through the anus and advanced to the cecum, which was identified by both the appendix and ileocecal valve. No adverse events experienced. The quality of the prep was good, using MoviPrep  The instrument was then slowly withdrawn as the colon was fully examined.  COLON FINDINGS: Moderate diverticulosis was noted in the ascending colon and sigmoid colon.   A 1 cm submucosal firm mass  or nodule was found in the rectum just above the dentate line.   The colon mucosa was otherwise normal.  Retroflexed views revealed internal hemorrhoids. The time to cecum=3 minutes 47 seconds.  Withdrawal time=9 minutes 44 seconds.  The scope was withdrawn and the procedure completed. COMPLICATIONS: There were no complications.  ENDOSCOPIC IMPRESSION: 1.    Moderate diverticulosis was noted in the ascending colon and sigmoid colon 2.   1 cm Submucosal rectal nodule in the rectum 3.   The colon mucosa was otherwise normal  RECOMMENDATIONS: 1.  Follow up colonoscopy in 5 years (index exam with advanced lesion) 2.  Rectal EUS with Dr Ardis Hughs "evaluate submucosal rectal nodule'   eSigned:  Eustace Quail, MD 08/06/2013 9:45 AM   cc: The Patient, Owens Loffler, MD, and Angelica Chessman MD

## 2013-08-06 NOTE — Progress Notes (Signed)
Report to PACU, RN, vss, BBS= Clear.  

## 2013-08-09 ENCOUNTER — Telehealth: Payer: Self-pay

## 2013-08-09 NOTE — Telephone Encounter (Signed)
  Follow up Call-  Call back number 08/06/2013  Post procedure Call Back phone  # [phone]  Permission to leave phone message Yes     Patient questions:  Do you have a fever, pain , or abdominal swelling? No. Pain Score  0 *  Have you tolerated food without any problems? Yes.    Have you been able to return to your normal activities? Yes.    Do you have any questions about your discharge instructions: Diet   No. Medications  No. Follow up visit  No.  Do you have questions or concerns about your Care? No.  Actions: * If pain score is 4 or above: No action needed, pain <4.  No problems per the pt. Maw

## 2013-08-10 ENCOUNTER — Encounter (HOSPITAL_COMMUNITY): Payer: Self-pay | Admitting: Pharmacy Technician

## 2013-08-10 ENCOUNTER — Encounter (HOSPITAL_COMMUNITY): Payer: Self-pay | Admitting: *Deleted

## 2013-08-10 ENCOUNTER — Telehealth: Payer: Self-pay

## 2013-08-10 ENCOUNTER — Other Ambulatory Visit: Payer: Self-pay

## 2013-08-10 DIAGNOSIS — K6289 Other specified diseases of anus and rectum: Secondary | ICD-10-CM

## 2013-08-10 NOTE — Telephone Encounter (Signed)
Tiffany Strickland, She needs a lower EUS for recently diagnosed subepthelial nodule in rectum (Dr. Henrene Pastor). This Thursday, 26min, radial +/- linear, moderate sedation OK, MAC if still available Thanks Tiffany Strickland, [name]. FYI; we'll get this scheduled. ----- Message ----- From: Levonne Spiller, RN Sent: 08/09/2013 9:14 AM To: Milus Banister, MD

## 2013-08-10 NOTE — Telephone Encounter (Signed)
She does not have to hold the Aggrenox.

## 2013-08-10 NOTE — Telephone Encounter (Signed)
08/12/13 1145 am EUS appt need to put in referral and hospital case instructions and call pt

## 2013-08-10 NOTE — Telephone Encounter (Signed)
Pt is on aggrenox should she stop for the procedure. I will call her back at [phone] and let her know if she needs to hold or not.  She was instructed and all other questions answered

## 2013-08-10 NOTE — Telephone Encounter (Signed)
Pt is aware and will call with any questions or concerns

## 2013-08-11 ENCOUNTER — Telehealth: Payer: Self-pay | Admitting: Gastroenterology

## 2013-08-11 NOTE — Telephone Encounter (Signed)
The procedure was explained to the pt and all her questions were answered, she was advised to call back with any further concerns

## 2013-08-12 ENCOUNTER — Encounter (HOSPITAL_COMMUNITY)
Admission: RE | Disposition: A | Discharge status: Home / Self Care | Payer: Self-pay | Source: Ambulatory Visit | Attending: Gastroenterology

## 2013-08-12 ENCOUNTER — Encounter (HOSPITAL_COMMUNITY): Payer: Self-pay | Admitting: Anesthesiology

## 2013-08-12 ENCOUNTER — Ambulatory Visit (HOSPITAL_COMMUNITY)
Admission: RE | Admit: 2013-08-12 | Discharge: 2013-08-12 | Disposition: A | Discharge status: Home / Self Care | Payer: No Typology Code available for payment source | Source: Ambulatory Visit | Attending: Gastroenterology | Admitting: Gastroenterology

## 2013-08-12 ENCOUNTER — Encounter (HOSPITAL_COMMUNITY): Payer: Self-pay | Admitting: Gastroenterology

## 2013-08-12 DIAGNOSIS — K6289 Other specified diseases of anus and rectum: Secondary | ICD-10-CM

## 2013-08-12 DIAGNOSIS — D128 Benign neoplasm of rectum: Secondary | ICD-10-CM | POA: Insufficient documentation

## 2013-08-12 DIAGNOSIS — D129 Benign neoplasm of anus and anal canal: Principal | ICD-10-CM

## 2013-08-12 DIAGNOSIS — R933 Abnormal findings on diagnostic imaging of other parts of digestive tract: Secondary | ICD-10-CM

## 2013-08-12 HISTORY — PX: EUS: SHX5427

## 2013-08-12 SURGERY — ULTRASOUND, LOWER GI TRACT, ENDOSCOPIC
Anesthesia: Monitor Anesthesia Care

## 2013-08-12 MED ORDER — MIDAZOLAM HCL 10 MG/2ML IJ SOLN
INTRAMUSCULAR | Status: AC
  Filled 2013-08-12: qty 2

## 2013-08-12 MED ORDER — SODIUM CHLORIDE 0.9 % IV SOLN: INTRAVENOUS | Status: DC

## 2013-08-12 MED ORDER — FENTANYL CITRATE 0.05 MG/ML IJ SOLN
INTRAMUSCULAR | Status: AC
  Filled 2013-08-12: qty 2

## 2013-08-12 MED ORDER — FENTANYL CITRATE 0.05 MG/ML IJ SOLN
INTRAMUSCULAR | Status: DC | PRN
  Administered 2013-08-12 (×2): 25 ug via INTRAVENOUS

## 2013-08-12 MED ORDER — MIDAZOLAM HCL 10 MG/2ML IJ SOLN
INTRAMUSCULAR | Status: DC | PRN
  Administered 2013-08-12 (×3): 1 mg via INTRAVENOUS
  Administered 2013-08-12: 2 mg via INTRAVENOUS

## 2013-08-12 MED ORDER — LACTATED RINGERS IV SOLN
INTRAVENOUS | Status: DC
  Administered 2013-08-12: 1000 mL via INTRAVENOUS

## 2013-08-12 MED ORDER — PROPOFOL 10 MG/ML IV BOLUS
INTRAVENOUS | Status: AC
  Filled 2013-08-12: qty 20

## 2013-08-12 MED ORDER — ESMOLOL HCL 10 MG/ML IV SOLN
INTRAVENOUS | Status: AC
  Filled 2013-08-12: qty 10

## 2013-08-12 NOTE — Discharge Instructions (Signed)

## 2013-08-12 NOTE — H&P (View-Only) (Signed)
[name], female   DOB: 09/27/1955, 58 y.o.   MRN: [id number] 58 year old female who sustained a fall approximately 2 months ago spraining her wrist. She now presents with right MCP joint pain, right elbow pain and mild right shoulder pain. His all been going on since the fall. Minimal relief with oral ibuprofen. No new injuries. Strength is intact. Is not limiting her activities of daily living. She continues to wear the volar wrist splint. She no longer has any wrist pain.  Pertinent past medical history: Hypertension, diabetes  Social history: Nonsmoker  Review of systems as per history of present illness otherwise all systems negative  Examination BP 120/77  Ht 5\' 5"  (1.651 m)  Wt 255 lb (115.667 kg)  BMI 42.43 kg/m2 Well-developed 58 year old female awake alert and oriented in no acute distress  Right Shoulder: Inspection reveals no abnormalities, atrophy or asymmetry. Palpation is normal with no tenderness over AC joint or bicipital groove. ROM is full in all planes. Rotator cuff strength normal throughout. No signs of impingement with negative Neer and Hawkin's tests, empty can sign. Speeds and Yergason's tests normal. No labral pathology noted Normal scapular function observed. No painful arc and no drop arm sign. No apprehension sign  Right Elbow: Range of motion full pronation, supination, flexion, extension. Strength is full to all of the above directions Stable to varus, valgus stress. Negative moving valgus stress test. Mild tenderness palpation of the lateral epicondyle. Ulnar nerve does not sublux. Negative cubital tunnel Tinel's.  Right hand: Full range of motion with no obvious swelling. Neurovascularly intact.  X-rays of right hand were reviewed. No obvious fracture. No evidence of significant DJD.

## 2013-08-12 NOTE — Op Note (Signed)
[facility] [place], [address]   ENDOSCOPIC ULTRASOUND PROCEDURE REPORT  PATIENT: Tiffany Strickland, Tiffany Strickland  MR#: [id number] BIRTHDATE: 1955/02/21  GENDER: Female ENDOSCOPIST: Milus Banister, MD REFERRED BY:  Eustace Quail, M.D. PROCEDURE DATE:  08/12/2013 PROCEDURE:   Lower EUS, sigmoidoscopy with biopsy ASA CLASS:      Class II INDICATIONS:   incidentally noted distal rectal nodule (colonoscopy Dr.  Henrene Pastor). MEDICATIONS: Fentanyl 50 mcg IV and Versed 5 mg IV  DESCRIPTION OF PROCEDURE:   After the risks benefits and alternatives of the procedure were  explained, informed consent was obtained. The patient was then placed in the left, lateral, decubitus postion and IV sedation was administered. Throughout the procedure, the patients blood pressure, pulse and oxygen saturations were monitored continuously.  Under direct visualization, the Pentax Radial EUS [id number]  endoscope was introduced through the anus  and advanced to the sigmoid colon . Water was used as necessary to provide an acoustic interface.  Upon completion of the imaging, water was removed and the patient was sent to the recovery room in satisfactory condition.   Sigmoidoscopic findings: 1. Small, firm subepithelial lesion in very distal rectum. This was approximatly 1cm across, lays 1cm from the dentate line. 2. Following EUS evaluation below, I sampled with lesion with "tunnel" biopsy technique.  EUS findings: 1. The lesion above corresponded with a 1.4cm by 6.73mm hypoechoic, homogeneous, well circumscribed lesion that communicated with the muscularis propria layer of the distal rectal wall.  Impression: 1.4cm by 6.44mm subepithelial distal rectal lesion. 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 Await pathology report for final recommendation.   _______________________________ eSignedMilus Banister, MD 08/12/2013 12:07 PM

## 2013-08-12 NOTE — Interval H&P Note (Signed)
History and Physical Interval Note:  09/18/7577 72:82 AM  Tiffany Strickland  has presented today for surgery, with the diagnosis of Rectal nodule [569.49]  The various methods of treatment have been discussed with the patient and family. After consideration of risks, benefits and other options for treatment, the patient has consented to  Procedure(s): LOWER ENDOSCOPIC ULTRASOUND (EUS) (N/A) as a surgical intervention .  The patient's history has been reviewed, patient examined, no change in status, stable for surgery.  I have reviewed the patient's chart and labs.  Questions were answered to the patient's satisfaction.     Milus Banister

## 2013-08-13 ENCOUNTER — Encounter (HOSPITAL_COMMUNITY): Payer: Self-pay | Admitting: Gastroenterology

## 2013-08-23 ENCOUNTER — Other Ambulatory Visit: Payer: Self-pay

## 2013-08-23 DIAGNOSIS — K629 Disease of anus and rectum, unspecified: Secondary | ICD-10-CM

## 2013-08-24 ENCOUNTER — Ambulatory Visit: Payer: No Typology Code available for payment source

## 2013-08-24 ENCOUNTER — Ambulatory Visit: Payer: No Typology Code available for payment source | Attending: Internal Medicine | Admitting: Internal Medicine

## 2013-08-24 ENCOUNTER — Encounter: Payer: Self-pay | Admitting: Internal Medicine

## 2013-08-24 VITALS — BP 138/81 | HR 75 | Temp 97.8°F | Resp 16 | Ht 65.0 in | Wt 262.0 lb

## 2013-08-24 DIAGNOSIS — R7309 Other abnormal glucose: Secondary | ICD-10-CM

## 2013-08-24 DIAGNOSIS — E119 Type 2 diabetes mellitus without complications: Secondary | ICD-10-CM | POA: Insufficient documentation

## 2013-08-24 DIAGNOSIS — Z860100 Personal history of colon polyps, unspecified: Secondary | ICD-10-CM | POA: Insufficient documentation

## 2013-08-24 DIAGNOSIS — E785 Hyperlipidemia, unspecified: Secondary | ICD-10-CM | POA: Insufficient documentation

## 2013-08-24 DIAGNOSIS — Z8673 Personal history of transient ischemic attack (TIA), and cerebral infarction without residual deficits: Secondary | ICD-10-CM | POA: Insufficient documentation

## 2013-08-24 DIAGNOSIS — E039 Hypothyroidism, unspecified: Secondary | ICD-10-CM | POA: Insufficient documentation

## 2013-08-24 DIAGNOSIS — R7303 Prediabetes: Secondary | ICD-10-CM

## 2013-08-24 DIAGNOSIS — K625 Hemorrhage of anus and rectum: Secondary | ICD-10-CM

## 2013-08-24 DIAGNOSIS — I10 Essential (primary) hypertension: Secondary | ICD-10-CM

## 2013-08-24 LAB — LIPID PANEL
CHOLESTEROL: 150 mg/dL (ref 0–200)
HDL: 53 mg/dL (ref >39)
LDL Cholesterol: 83 mg/dL (ref 0–99)
Total CHOL/HDL Ratio: 2.8 Ratio
Triglycerides: 70 mg/dL (ref <150)
VLDL: 14 mg/dL (ref 0–40)

## 2013-08-24 LAB — GLUCOSE, POCT (MANUAL RESULT ENTRY): POC GLUCOSE: 98 mg/dL (ref 70–99)

## 2013-08-24 LAB — POCT GLYCOSYLATED HEMOGLOBIN (HGB A1C): Hemoglobin A1C: 6.2

## 2013-08-24 NOTE — Progress Notes (Signed)
Pt is here following up on her HTN, diabetes and her rectal bleeding. Pt recently had a colonoscopy.

## 2013-08-24 NOTE — Progress Notes (Signed)
[name], female   DOB: 1955/08/27, 58 y.o.   MRN: [id number]   Tiffany Strickland, is a 58 y.o. female  [id number]  [id number]  DOB - 01-23-1956  Chief Complaint  Patient presents with  . Follow-up        Subjective:   Tiffany Strickland is a 58 y.o. female here today for a follow up visit. 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 Patient is here for followup of her high blood pressure and diabetes. She is on medications as listed below. Her major complaint today is ongoing occasional bleeding per rectum especially after defecation.  We sent her for colonoscopy which she did last month with the following findings: 1) Moderate diverticulosis was noted in the ascending colon and sigmoid colon.  2). 1 cm Submucosal rectal nodule in the rectum. 3). The colon mucosa was otherwise normal. RECOMMENDATIONS: 1. Follow up colonoscopy in 5 years (index exam with advanced lesion). 2. Rectal EUS with Dr Ardis Hughs "evaluate submucosal rectal nodule. The endoscopic ultrasound showed: 1.4cm by 6.26mm subepithelial distal rectal lesion. 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 Rectum, biopsy showed - POLYPOID FRAGMENTS OF BENIGN COLONIC MUCOSA WITH INTRAMUCOSAL LYMPHOID AGGREGATES AND PROLAPSE-TYPE CHANGES. - NO EVIDENCE OF ADENOMATOUS CHANGES OR MALIGNANCY. She has no significant risk factor for colon cancer, no significant family history. She does not smoke cigarette she does not drink alcohol. Blood pressure is controlled as well as blood sugar. Patient has No headache, No chest pain, No abdominal pain - No Nausea, No new weakness tingling or numbness, No Cough - SOB.  Problem  Essential Hypertension    ALLERGIES: Allergies  Allergen Reactions  . Pollen Extract Other (See Comments)    Reaction unknown  . Tomato Other (See  Comments)    Reaction unknown  . Wool Alcohol [Lanolin] Other (See Comments)    Reaction unknown    PAST MEDICAL HISTORY: Past Medical History  Diagnosis Date  . Hypertension   . Stroke   . Thyroid disease   . Hyperlipidemia   . Diabetes mellitus without complication   . Colon polyp     adenomatous    MEDICATIONS AT HOME: Prior to Admission medications   Medication Sig Start Date End Date Taking? Authorizing Provider  dipyridamole-aspirin (AGGRENOX) 200-25 MG per 12 hr capsule Take 1 capsule by mouth 2 (two) times daily.    Historical Provider, MD  levothyroxine (SYNTHROID, LEVOTHROID) 137 MCG tablet Take 137 mcg by mouth daily before breakfast.    Historical Provider, MD  lisinopril-hydrochlorothiazide (PRINZIDE,ZESTORETIC) 20-25 MG per tablet Take 1 tablet by mouth every morning.    Historical Provider, MD  polyethylene glycol (MIRALAX / GLYCOLAX) packet Take 17 g by mouth daily.    Historical Provider, MD     Objective:   Filed Vitals:   08/24/13 0916  BP: 138/81  Pulse: 75  Temp: 97.8 F (36.6 C)  TempSrc: Oral  Resp: 16  Height: 5\' 5"  (1.651 m)  Weight: 262 lb (118.842 kg)  SpO2: 96%    Exam General appearance : Awake, alert, not in any distress. Speech Clear. Not toxic looking HEENT: Atraumatic and Normocephalic, pupils equally reactive to light and accomodation Neck: supple, no JVD. No cervical lymphadenopathy.  Chest:Good air entry bilaterally, no added sounds  CVS: S1 S2 regular, no murmurs.  Abdomen: Bowel sounds present, Non tender and not distended  with no gaurding, rigidity or rebound. Extremities: B/L Lower Ext shows no edema, both legs are warm to touch Neurology: Awake alert, and oriented X 3, CN II-XII intact, Non focal Skin:No Rash Wounds:N/A  Data Review Lab Results  Component Value Date   HGBA1C 6.2 08/24/2013   HGBA1C 6.1 04/15/2013   HGBA1C 6.2 12/11/2012     Assessment & Plan   1. Prediabetes  - Glucose (CBG) - HgB A1c is 6.2%  today - Lipid panel  2. Essential hypertension  - Lipid panel Continue lisinopril/hydrochlorothiazide  DASH diet  3. Rectal bleeding most likely from anal fissure from straining especially because of associated pain Patient is to continue MiraLAX for constipation and avoid straining during defecation. Patient has been worked up extensively, negative for malignancy. Patient is also advised on fiber diet  Patient was counseled extensively about nutrition and exercise  Return in about 3 months (around 11/24/2013), or if symptoms worsen or fail to improve, for Hemoglobin A1C and Follow up, DM, Follow up HTN.  The patient was given clear instructions to go to ER or return to medical center if symptoms don't improve, worsen or new problems develop. The patient verbalized understanding. The patient was told to call to get lab results if they haven't heard anything in the next week.   This note has been created with Surveyor, quantity. Any transcriptional errors are unintentional.    Angelica Chessman, MD, [place], [place], [place] and [facility] [place], [place]   08/24/2013, 10:09 AM

## 2013-08-24 NOTE — Patient Instructions (Signed)
DASH Eating Plan DASH stands for "Dietary Approaches to Stop Hypertension." The DASH eating plan is a healthy eating plan that has been shown to reduce high blood pressure (hypertension). Additional health benefits may include reducing the risk of type 2 diabetes mellitus, heart disease, and stroke. The DASH eating plan may also help with weight loss. WHAT DO I NEED TO KNOW ABOUT THE DASH EATING PLAN? For the DASH eating plan, you will follow these general guidelines:  Choose foods with a percent daily value for sodium of less than 5% (as listed on the food label).  Use salt-free seasonings or herbs instead of table salt or sea salt.  Check with your health care provider or pharmacist before using salt substitutes.  Eat lower-sodium products, often labeled as "lower sodium" or "no salt added."  Eat fresh foods.  Eat more vegetables, fruits, and low-fat dairy products.  Choose whole grains. Look for the word "whole" as the first word in the ingredient list.  Choose fish and skinless chicken or turkey more often than red meat. Limit fish, poultry, and meat to 6 oz (170 g) each day.  Limit sweets, desserts, sugars, and sugary drinks.  Choose heart-healthy fats.  Limit cheese to 1 oz (28 g) per day.  Eat more home-cooked food and less restaurant, buffet, and fast food.  Limit fried foods.  Cook foods using methods other than frying.  Limit canned vegetables. If you do use them, rinse them well to decrease the sodium.  When eating at a restaurant, ask that your food be prepared with less salt, or no salt if possible. WHAT FOODS CAN I EAT? Seek help from a dietitian for individual calorie needs. Grains Whole grain or whole wheat bread. Brown rice. Whole grain or whole wheat pasta. Quinoa, bulgur, and whole grain cereals. Low-sodium cereals. Corn or whole wheat flour tortillas. Whole grain cornbread. Whole grain crackers. Low-sodium crackers. Vegetables Fresh or frozen vegetables  (raw, steamed, roasted, or grilled). Low-sodium or reduced-sodium tomato and vegetable juices. Low-sodium or reduced-sodium tomato sauce and paste. Low-sodium or reduced-sodium canned vegetables.  Fruits All fresh, canned (in natural juice), or frozen fruits. Meat and Other Protein Products Ground beef (85% or leaner), grass-fed beef, or beef trimmed of fat. Skinless chicken or turkey. Ground chicken or turkey. Pork trimmed of fat. All fish and seafood. Eggs. Dried beans, peas, or lentils. Unsalted nuts and seeds. Unsalted canned beans. Dairy Low-fat dairy products, such as skim or 1% milk, 2% or reduced-fat cheeses, low-fat ricotta or cottage cheese, or plain low-fat yogurt. Low-sodium or reduced-sodium cheeses. Fats and Oils Tub margarines without trans fats. Light or reduced-fat mayonnaise and salad dressings (reduced sodium). Avocado. Safflower, olive, or canola oils. Natural peanut or almond butter. Other Unsalted popcorn and pretzels. The items listed above may not be a complete list of recommended foods or beverages. Contact your dietitian for more options. WHAT FOODS ARE NOT RECOMMENDED? Grains White bread. White pasta. White rice. Refined cornbread. Bagels and croissants. Crackers that contain trans fat. Vegetables Creamed or fried vegetables. Vegetables in a cheese sauce. Regular canned vegetables. Regular canned tomato sauce and paste. Regular tomato and vegetable juices. Fruits Dried fruits. Canned fruit in light or heavy syrup. Fruit juice. Meat and Other Protein Products Fatty cuts of meat. Ribs, chicken wings, bacon, sausage, bologna, salami, chitterlings, fatback, hot dogs, bratwurst, and packaged luncheon meats. Salted nuts and seeds. Canned beans with salt. Dairy Whole or 2% milk, cream, half-and-half, and cream cheese. Whole-fat or sweetened yogurt. Full-fat   cheeses or blue cheese. Nondairy creamers and whipped toppings. Processed cheese, cheese spreads, or cheese  curds. Condiments Onion and garlic salt, seasoned salt, table salt, and sea salt. Canned and packaged gravies. Worcestershire sauce. Tartar sauce. Barbecue sauce. Teriyaki sauce. Soy sauce, including reduced sodium. Steak sauce. Fish sauce. Oyster sauce. Cocktail sauce. Horseradish. Ketchup and mustard. Meat flavorings and tenderizers. Bouillon cubes. Hot sauce. Tabasco sauce. Marinades. Taco seasonings. Relishes. Fats and Oils Butter, stick margarine, lard, shortening, ghee, and bacon fat. Coconut, palm kernel, or palm oils. Regular salad dressings. Other Pickles and olives. Salted popcorn and pretzels. The items listed above may not be a complete list of foods and beverages to avoid. Contact your dietitian for more information. WHERE CAN I FIND MORE INFORMATION? National Heart, Lung, and Blood Institute: travelstabloid.com Document Released: 01/17/2011 Document Revised: 02/02/2013 Document Reviewed: 12/02/2012 [facility] Patient Information 2015 [place], [place]. This information is not intended to replace advice given to you by your health care provider. Make sure you discuss any questions you have with your health care provider. Diabetes Mellitus and Food It is important for you to manage your blood sugar (glucose) level. Your blood glucose level can be greatly affected by what you eat. Eating healthier foods in the appropriate amounts throughout the day at about the same time each day will help you control your blood glucose level. It can also help slow or prevent worsening of your diabetes mellitus. Healthy eating may even help you improve the level of your blood pressure and reach or maintain a healthy weight.  HOW CAN FOOD AFFECT ME? Carbohydrates Carbohydrates affect your blood glucose level more than any other type of food. Your dietitian will help you determine how many carbohydrates to eat at each meal and teach you how to count carbohydrates. Counting  carbohydrates is important to keep your blood glucose at a healthy level, especially if you are using insulin or taking certain medicines for diabetes mellitus. Alcohol Alcohol can cause sudden decreases in blood glucose (hypoglycemia), especially if you use insulin or take certain medicines for diabetes mellitus. Hypoglycemia can be a life-threatening condition. Symptoms of hypoglycemia (sleepiness, dizziness, and disorientation) are similar to symptoms of having too much alcohol.  If your health care provider has given you approval to drink alcohol, do so in moderation and use the following guidelines:  Women should not have more than one drink per day, and men should not have more than two drinks per day. One drink is equal to:  12 oz of beer.  5 oz of wine.  1 oz of hard liquor.  Do not drink on an empty stomach.  Keep yourself hydrated. Have water, diet soda, or unsweetened iced tea.  Regular soda, juice, and other mixers might contain a lot of carbohydrates and should be counted. WHAT FOODS ARE NOT RECOMMENDED? As you make food choices, it is important to remember that all foods are not the same. Some foods have fewer nutrients per serving than other foods, even though they might have the same number of calories or carbohydrates. It is difficult to get your body what it needs when you eat foods with fewer nutrients. Examples of foods that you should avoid that are high in calories and carbohydrates but low in nutrients include:  Trans fats (most processed foods list trans fats on the Nutrition Facts label).  Regular soda.  Juice.  Candy.  Sweets, such as cake, pie, doughnuts, and cookies.  Fried foods. WHAT FOODS CAN I EAT? Have nutrient-rich foods,  which will nourish your body and keep you healthy. The food you should eat also will depend on several factors, including:  The calories you need.  The medicines you take.  Your weight.  Your blood glucose level.  Your  blood pressure level.  Your cholesterol level. You also should eat a variety of foods, including:  Protein, such as meat, poultry, fish, tofu, nuts, and seeds (lean animal proteins are best).  Fruits.  Vegetables.  Dairy products, such as milk, cheese, and yogurt (low fat is best).  Breads, grains, pasta, cereal, rice, and beans.  Fats such as olive oil, trans fat-free margarine, canola oil, avocado, and olives. DOES EVERYONE WITH DIABETES MELLITUS HAVE THE SAME MEAL PLAN? Because every person with diabetes mellitus is different, there is not one meal plan that works for everyone. It is very important that you meet with a dietitian who will help you create a meal plan that is just right for you. Document Released: 10/25/2004 Document Revised: 02/02/2013 Document Reviewed: 12/25/2012 [facility] Patient Information 2015 [place], [place]. This information is not intended to replace advice given to you by your health care provider. Make sure you discuss any questions you have with your health care provider. Diabetes and Exercise Exercising regularly is important. It is not just about losing weight. It has many health benefits, such as:  Improving your overall fitness, flexibility, and endurance.  Increasing your bone density.  Helping with weight control.  Decreasing your body fat.  Increasing your muscle strength.  Reducing stress and tension.  Improving your overall health. People with diabetes who exercise gain additional benefits because exercise:  Reduces appetite.  Improves the body's use of blood sugar (glucose).  Helps lower or control blood glucose.  Decreases blood pressure.  Helps control blood lipids (such as cholesterol and triglycerides).  Improves the body's use of the hormone insulin by:  Increasing the body's insulin sensitivity.  Reducing the body's insulin needs.  Decreases the risk for heart disease because exercising:  Lowers cholesterol and  triglycerides levels.  Increases the levels of good cholesterol (such as high-density lipoproteins [HDL]) in the body.  Lowers blood glucose levels. YOUR ACTIVITY PLAN  Choose an activity that you enjoy and set realistic goals. Your health care provider or diabetes educator can help you make an activity plan that works for you. You can break activities into 2 or 3 sessions throughout the day. Doing so is as good as one long session. Exercise ideas include:  Taking the dog for a walk.  Taking the stairs instead of the elevator.  Dancing to your favorite song.  Doing your favorite exercise with a friend. RECOMMENDATIONS FOR EXERCISING WITH TYPE 1 OR TYPE 2 DIABETES   Check your blood glucose before exercising. If blood glucose levels are greater than 240 mg/dL, check for urine ketones. Do not exercise if ketones are present.  Avoid injecting insulin into areas of the body that are going to be exercised. For example, avoid injecting insulin into:  The arms when playing tennis.  The legs when jogging.  Keep a record of:  Food intake before and after you exercise.  Expected peak times of insulin action.  Blood glucose levels before and after you exercise.  The type and amount of exercise you have done.  Review your records with your health care provider. Your health care provider will help you to develop guidelines for adjusting food intake and insulin amounts before and after exercising.  If you take insulin or oral  hypoglycemic agents, watch for signs and symptoms of hypoglycemia. They include:  Dizziness.  Shaking.  Sweating.  Chills.  Confusion.  Drink plenty of water while you exercise to prevent dehydration or heat stroke. Body water is lost during exercise and must be replaced.  Talk to your health care provider before starting an exercise program to make sure it is safe for you. Remember, almost any type of activity is better than none. Document Released:  04/20/2003 Document Revised: 09/30/2012 Document Reviewed: 07/07/2012 [facility] Patient Information 2015 [place], [place]. This information is not intended to replace advice given to you by your health care provider. Make sure you discuss any questions you have with your health care provider.

## 2013-08-25 ENCOUNTER — Telehealth: Payer: Self-pay | Admitting: Emergency Medicine

## 2013-08-25 NOTE — Telephone Encounter (Signed)
Message copied by Ricci Barker on Wed Aug 25, 2013  5:25 PM ------      Message from: Angelica Chessman E      Created: Wed Aug 25, 2013  5:00 PM       Please inform patient that her laboratory tests results are all within normal limit however her hemoglobin A1c shows that she is at risk of developing diabetes. Please adhere with low,carbohydrate, low sugar diet to reduce risk of developing frank diabetes in the future. ------

## 2013-08-25 NOTE — Telephone Encounter (Signed)
Left message for pt to call for lab results 

## 2013-09-06 DIAGNOSIS — K6289 Other specified diseases of anus and rectum: Secondary | ICD-10-CM | POA: Insufficient documentation

## 2013-10-29 ENCOUNTER — Telehealth: Payer: Self-pay | Admitting: Internal Medicine

## 2013-10-29 NOTE — Telephone Encounter (Signed)
Pt. Dropped off "patient assistance program" forms at the front desk for her PCP Dr. Doreene Burke to complete. Pt would like the forms to be mailed back to her home.

## 2013-11-01 NOTE — Telephone Encounter (Signed)
Pt requesting paper work for patient assistance program be mailed back to her when completed.

## 2013-11-30 ENCOUNTER — Telehealth: Payer: Self-pay | Admitting: Internal Medicine

## 2013-11-30 NOTE — Telephone Encounter (Signed)
Pt walked in stating that she dropped off a form for her PCP to fill out back in September so that she can continue her dipyridamole-aspirin (AGGRENOX) 200-25 MG per 12 hr capsule medication. Please follow up with patient.

## 2013-11-30 NOTE — Telephone Encounter (Signed)
Documents reviewed per Dr. Doreene Burke and given to pt

## 2013-12-13 ENCOUNTER — Encounter: Payer: Self-pay | Admitting: Internal Medicine

## 2014-01-17 ENCOUNTER — Other Ambulatory Visit: Payer: Self-pay

## 2014-01-17 ENCOUNTER — Telehealth: Payer: Self-pay

## 2014-01-17 MED ORDER — POLYETHYLENE GLYCOL 3350 17 G PO PACK: 17.0000 g | PACK | Freq: Every day | ORAL | Status: DC

## 2014-01-17 NOTE — Telephone Encounter (Signed)
Patient calling requesting a refill on her mirilax [facility]

## 2014-01-31 ENCOUNTER — Ambulatory Visit: Payer: No Typology Code available for payment source | Attending: Internal Medicine | Admitting: Internal Medicine

## 2014-01-31 ENCOUNTER — Ambulatory Visit (HOSPITAL_BASED_OUTPATIENT_CLINIC_OR_DEPARTMENT_OTHER): Payer: No Typology Code available for payment source | Admitting: *Deleted

## 2014-01-31 ENCOUNTER — Encounter: Payer: Self-pay | Admitting: Internal Medicine

## 2014-01-31 VITALS — BP 166/88 | HR 68 | Temp 97.8°F | Resp 16 | Ht 65.0 in | Wt 263.0 lb

## 2014-01-31 DIAGNOSIS — Z79899 Other long term (current) drug therapy: Secondary | ICD-10-CM | POA: Insufficient documentation

## 2014-01-31 DIAGNOSIS — E038 Other specified hypothyroidism: Secondary | ICD-10-CM

## 2014-01-31 DIAGNOSIS — Z86010 Personal history of colonic polyps: Secondary | ICD-10-CM | POA: Insufficient documentation

## 2014-01-31 DIAGNOSIS — Z23 Encounter for immunization: Secondary | ICD-10-CM

## 2014-01-31 DIAGNOSIS — I10 Essential (primary) hypertension: Secondary | ICD-10-CM | POA: Insufficient documentation

## 2014-01-31 DIAGNOSIS — E039 Hypothyroidism, unspecified: Secondary | ICD-10-CM | POA: Insufficient documentation

## 2014-01-31 DIAGNOSIS — E785 Hyperlipidemia, unspecified: Secondary | ICD-10-CM | POA: Insufficient documentation

## 2014-01-31 DIAGNOSIS — I639 Cerebral infarction, unspecified: Secondary | ICD-10-CM

## 2014-01-31 DIAGNOSIS — E119 Type 2 diabetes mellitus without complications: Secondary | ICD-10-CM

## 2014-01-31 DIAGNOSIS — Z90710 Acquired absence of both cervix and uterus: Secondary | ICD-10-CM | POA: Insufficient documentation

## 2014-01-31 DIAGNOSIS — Z8673 Personal history of transient ischemic attack (TIA), and cerebral infarction without residual deficits: Secondary | ICD-10-CM | POA: Insufficient documentation

## 2014-01-31 LAB — COMPLETE METABOLIC PANEL WITH GFR
ALK PHOS: 72 U/L (ref 39–117)
ALT: 20 U/L (ref 0–35)
AST: 15 U/L (ref 0–37)
Albumin: 3.6 g/dL (ref 3.5–5.2)
BUN: 9 mg/dL (ref 6–23)
CALCIUM: 9.6 mg/dL (ref 8.4–10.5)
CHLORIDE: 101 meq/L (ref 96–112)
CO2: 33 meq/L — AB (ref 19–32)
Creat: 0.68 mg/dL (ref 0.50–1.10)
GFR, Est African American: >89 mL/min
GFR, Est Non African American: >89 mL/min
Glucose, Bld: 78 mg/dL (ref 70–99)
Potassium: 4.6 mEq/L (ref 3.5–5.3)
Sodium: 142 mEq/L (ref 135–145)
TOTAL PROTEIN: 6.8 g/dL (ref 6.0–8.3)
Total Bilirubin: 0.4 mg/dL (ref 0.2–1.2)

## 2014-01-31 LAB — GLUCOSE, POCT (MANUAL RESULT ENTRY): POC Glucose: 81 mg/dl (ref 70–99)

## 2014-01-31 LAB — POCT GLYCOSYLATED HEMOGLOBIN (HGB A1C): Hemoglobin A1C: 6.4

## 2014-01-31 MED ORDER — LISINOPRIL-HYDROCHLOROTHIAZIDE 20-25 MG PO TABS: 1.0000 | ORAL_TABLET | Freq: Every morning | ORAL | Status: DC

## 2014-01-31 MED ORDER — POLYETHYLENE GLYCOL 3350 17 G PO PACK: 17.0000 g | PACK | Freq: Every day | ORAL | Status: DC

## 2014-01-31 MED ORDER — LEVOTHYROXINE SODIUM 137 MCG PO TABS: 137.0000 ug | ORAL_TABLET | Freq: Every day | ORAL | Status: DC

## 2014-01-31 MED ORDER — ASPIRIN-DIPYRIDAMOLE ER 25-200 MG PO CP12: 1.0000 | ORAL_CAPSULE | Freq: Two times a day (BID) | ORAL | Status: DC

## 2014-01-31 NOTE — Progress Notes (Signed)
[name], female   DOB: 1955-12-20, 58 y.o.   MRN: [id number]   Tiffany Strickland, is a 58 y.o. female  [id number]  [id number]  DOB - 1955/11/05  Chief Complaint  Patient presents with  . Follow-up        Subjective:   Tiffany Strickland is a 58 y.o. female here today for a follow up visit. 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 Patient is here today for her routine follow-up of hypertension and diabetes. Her highest A1c on record was 7.2%, patient is on diet control. Last A1c was 6.2%. She has no complaint today. She needs a refill of her medications. She has not taken her blood pressure medicine today. She does not smoke cigarettes, she does not drink alcohol. She tries to adhere with exercise regimen. She is trying hard on diet control of hypertension and diabetes. Patient has No headache, No chest pain, No abdominal pain - No Nausea, No new weakness tingling or numbness, No Cough - SOB.  Problem  Stroke    ALLERGIES: Allergies  Allergen Reactions  . Pollen Extract Other (See Comments)    Reaction unknown  . Tomato Other (See Comments)    Reaction unknown  . Wool Alcohol [Lanolin] Other (See Comments)    Reaction unknown    PAST MEDICAL HISTORY: Past Medical History  Diagnosis Date  . Hypertension   . Stroke   . Thyroid disease   . Hyperlipidemia   . Diabetes mellitus without complication   . Colon polyp     adenomatous    MEDICATIONS AT HOME: Prior to Admission medications   Medication Sig Start Date End Date Taking? Authorizing Provider  dipyridamole-aspirin (AGGRENOX) 200-25 MG per 12 hr capsule Take 1 capsule by mouth 2 (two) times daily. 01/31/14  Yes Tresa Garter, MD  levothyroxine (SYNTHROID, LEVOTHROID) 137 MCG tablet Take 1 tablet (137 mcg total) by mouth daily before breakfast. 01/31/14   Yes Tresa Garter, MD  lisinopril-hydrochlorothiazide (PRINZIDE,ZESTORETIC) 20-25 MG per tablet Take 1 tablet by mouth every morning. 01/31/14  Yes Tresa Garter, MD  polyethylene glycol (MIRALAX / GLYCOLAX) packet Take 17 g by mouth daily. 01/31/14  Yes Tresa Garter, MD     Objective:   Filed Vitals:   01/31/14 1128  BP: 166/88  Pulse: 68  Temp: 97.8 F (36.6 C)  TempSrc: Oral  Resp: 16  Height: 5\' 5"  (1.651 m)  Weight: 263 lb (119.296 kg)  SpO2: 98%    Exam General appearance : Awake, alert, not in any distress. Speech Clear. Not toxic looking, morbidly obese, poor dentition HEENT: Atraumatic and Normocephalic, pupils equally reactive to light and accomodation Neck: supple, no JVD. No cervical lymphadenopathy.  Chest:Good air entry bilaterally, no added sounds  CVS: S1 S2 regular, no murmurs.  Abdomen: Bowel sounds present, Non tender and not distended with no gaurding, rigidity or rebound. Extremities: B/L Lower Ext shows no edema, both legs are warm to touch Neurology: Awake alert, and oriented X 3, CN II-XII intact, Non focal  Data Review Lab Results  Component Value Date   HGBA1C 6.4 01/31/2014   HGBA1C 6.2 08/24/2013   HGBA1C 6.1 04/15/2013     Assessment & Plan   1. Type 2 diabetes mellitus without complication Diet controlled - Glucose (CBG) - HgB A1c is 6.4% today as a guest 6.2% in July, patient was to  continue diet controlled, refuses medication.   Aim for 2-3 Carb Choices per meal (30-45 grams) +/- 1 either way  Aim for 0-15 Carbs per snack if hungry  Include protein in moderation with your meals and snacks  Consider reading food labels for Total Carbohydrate and Fat Grams of foods  Consider checking BG at alternate times per day  Continue taking medication as directed Fruit Punch - find one with no sugar  Measure and decrease portions of carbohydrate foods  Make your plate and don't go back for seconds   2. Essential  hypertension: Uncontrolled Patient has not taken medication today, patient was counseled on compliance with medications. - lisinopril-hydrochlorothiazide (PRINZIDE,ZESTORETIC) 20-25 MG per tablet; Take 1 tablet by mouth every morning.  Dispense: 90 tablet; Refill: 3  - COMPLETE METABOLIC PANEL WITH GFR  3. Stroke: Stable  - dipyridamole-aspirin (AGGRENOX) 200-25 MG per 12 hr capsule; Take 1 capsule by mouth 2 (two) times daily.  Dispense: 180 capsule; Refill: 3 - polyethylene glycol (MIRALAX / GLYCOLAX) packet; Take 17 g by mouth daily.  Dispense: 14 each; Refill: 2  4. Other specified hypothyroidism  - levothyroxine (SYNTHROID, LEVOTHROID) 137 MCG tablet; Take 1 tablet (137 mcg total) by mouth daily before breakfast.  Dispense: 90 tablet; Refill: 3 - TSH  Had hysterectomy, up to date on Colonoscopy and Mammogram  Patient was extensively counseled on nutrition and exercise  Return in about 3 months (around 05/02/2014), or if symptoms worsen or fail to improve, for Hemoglobin A1C and Follow up, DM, Follow up HTN.  The patient was given clear instructions to go to ER or return to medical center if symptoms don't improve, worsen or new problems develop. The patient verbalized understanding. The patient was told to call to get lab results if they haven't heard anything in the next week.   This note has been created with Surveyor, quantity. Any transcriptional errors are unintentional.    Angelica Chessman, MD, [place], [place], [place] and [place], [place]   01/31/2014, 12:14 PM

## 2014-01-31 NOTE — Progress Notes (Signed)
Pt is here today following up on her HTN and her prediabetes. Pt states that she is eating healthy and exercising. Pt needs her medications refilled.

## 2014-01-31 NOTE — Patient Instructions (Signed)
Hypertension Hypertension, commonly called high blood pressure, is when the force of blood pumping through your arteries is too strong. Your arteries are the blood vessels that carry blood from your heart throughout your body. A blood pressure reading consists of a higher number over a lower number, such as 110/72. The higher number (systolic) is the pressure inside your arteries when your heart pumps. The lower number (diastolic) is the pressure inside your arteries when your heart relaxes. Ideally you want your blood pressure below 120/80. Hypertension forces your heart to work harder to pump blood. Your arteries may become narrow or stiff. Having hypertension puts you at risk for heart disease, stroke, and other problems.  RISK FACTORS Some risk factors for high blood pressure are controllable. Others are not.  Risk factors you cannot control include:   Race. You may be at higher risk if you are African American.  Age. Risk increases with age.  Gender. Men are at higher risk than women before age 45 years. After age 65, women are at higher risk than men. Risk factors you can control include:  Not getting enough exercise or physical activity.  Being overweight.  Getting too much fat, sugar, calories, or salt in your diet.  Drinking too much alcohol. SIGNS AND SYMPTOMS Hypertension does not usually cause signs or symptoms. Extremely high blood pressure (hypertensive crisis) may cause headache, anxiety, shortness of breath, and nosebleed. DIAGNOSIS  To check if you have hypertension, your health care provider will measure your blood pressure while you are seated, with your arm held at the level of your heart. It should be measured at least twice using the same arm. Certain conditions can cause a difference in blood pressure between your right and left arms. A blood pressure reading that is higher than normal on one occasion does not mean that you need treatment. If one blood pressure reading  is high, ask your health care provider about having it checked again. TREATMENT  Treating high blood pressure includes making lifestyle changes and possibly taking medicine. Living a healthy lifestyle can help lower high blood pressure. You may need to change some of your habits. Lifestyle changes may include:  Following the DASH diet. This diet is high in fruits, vegetables, and whole grains. It is low in salt, red meat, and added sugars.  Getting at least 2 hours of brisk physical activity every week.  Losing weight if necessary.  Not smoking.  Limiting alcoholic beverages.  Learning ways to reduce stress. If lifestyle changes are not enough to get your blood pressure under control, your health care provider may prescribe medicine. You may need to take more than one. Work closely with your health care provider to understand the risks and benefits. HOME CARE INSTRUCTIONS  Have your blood pressure rechecked as directed by your health care provider.   Take medicines only as directed by your health care provider. Follow the directions carefully. Blood pressure medicines must be taken as prescribed. The medicine does not work as well when you skip doses. Skipping doses also puts you at risk for problems.   Do not smoke.   Monitor your blood pressure at home as directed by your health care provider. SEEK MEDICAL CARE IF:   You think you are having a reaction to medicines taken.  You have recurrent headaches or feel dizzy.  You have swelling in your ankles.  You have trouble with your vision. SEEK IMMEDIATE MEDICAL CARE IF:  You develop a severe headache or confusion.    You have unusual weakness, numbness, or feel faint.  You have severe chest or abdominal pain.  You vomit repeatedly.  You have trouble breathing. MAKE SURE YOU:   Understand these instructions.  Will watch your condition.  Will get help right away if you are not doing well or get worse. Document  Released: 01/28/2005 Document Revised: 06/14/2013 Document Reviewed: 11/20/2012 Exit[employer] Patient Information 2015 [employer]. This information is not intended to replace advice given to you by your health care provider. Make sure you discuss any questions you have with your health care provider. DASH Eating Plan DASH stands for "Dietary Approaches to Stop Hypertension." The DASH eating plan is a healthy eating plan that has been shown to reduce high blood pressure (hypertension). Additional health benefits may include reducing the risk of type 2 diabetes mellitus, heart disease, and stroke. The DASH eating plan may also help with weight loss. WHAT DO I NEED TO KNOW ABOUT THE DASH EATING PLAN? For the DASH eating plan, you will follow these general guidelines:  Choose foods with a percent daily value for sodium of less than 5% (as listed on the food label).  Use salt-free seasonings or herbs instead of table salt or sea salt.  Check with your health care provider or pharmacist before using salt substitutes.  Eat lower-sodium products, often labeled as "lower sodium" or "no salt added."  Eat fresh foods.  Eat more vegetables, fruits, and low-fat dairy products.  Choose whole grains. Look for the word "whole" as the first word in the ingredient list.  Choose fish and skinless chicken or turkey more often than red meat. Limit fish, poultry, and meat to 6 oz (170 g) each day.  Limit sweets, desserts, sugars, and sugary drinks.  Choose heart-healthy fats.  Limit cheese to 1 oz (28 g) per day.  Eat more home-cooked food and less restaurant, buffet, and fast food.  Limit fried foods.  Cook foods using methods other than frying.  Limit canned vegetables. If you do use them, rinse them well to decrease the sodium.  When eating at a restaurant, ask that your food be prepared with less salt, or no salt if possible. WHAT FOODS CAN I EAT? Seek help from a dietitian for individual  calorie needs. Grains Whole grain or whole wheat bread. Brown rice. Whole grain or whole wheat pasta. Quinoa, bulgur, and whole grain cereals. Low-sodium cereals. Corn or whole wheat flour tortillas. Whole grain cornbread. Whole grain crackers. Low-sodium crackers. Vegetables Fresh or frozen vegetables (raw, steamed, roasted, or grilled). Low-sodium or reduced-sodium tomato and vegetable juices. Low-sodium or reduced-sodium tomato sauce and paste. Low-sodium or reduced-sodium canned vegetables.  Fruits All fresh, canned (in natural juice), or frozen fruits. Meat and Other Protein Products Ground beef (85% or leaner), grass-fed beef, or beef trimmed of fat. Skinless chicken or turkey. Ground chicken or turkey. Pork trimmed of fat. All fish and seafood. Eggs. Dried beans, peas, or lentils. Unsalted nuts and seeds. Unsalted canned beans. Dairy Low-fat dairy products, such as skim or 1% milk, 2% or reduced-fat cheeses, low-fat ricotta or cottage cheese, or plain low-fat yogurt. Low-sodium or reduced-sodium cheeses. Fats and Oils Tub margarines without trans fats. Light or reduced-fat mayonnaise and salad dressings (reduced sodium). Avocado. Safflower, olive, or canola oils. Natural peanut or almond butter. Other Unsalted popcorn and pretzels. The items listed above may not be a complete list of recommended foods or beverages. Contact your dietitian for more options. WHAT FOODS ARE NOT RECOMMENDED? Grains White bread.   White pasta. White rice. Refined cornbread. Bagels and croissants. Crackers that contain trans fat. Vegetables Creamed or fried vegetables. Vegetables in a cheese sauce. Regular canned vegetables. Regular canned tomato sauce and paste. Regular tomato and vegetable juices. Fruits Dried fruits. Canned fruit in light or heavy syrup. Fruit juice. Meat and Other Protein Products Fatty cuts of meat. Ribs, chicken wings, bacon, sausage, bologna, salami, chitterlings, fatback, hot dogs,  bratwurst, and packaged luncheon meats. Salted nuts and seeds. Canned beans with salt. Dairy Whole or 2% milk, cream, half-and-half, and cream cheese. Whole-fat or sweetened yogurt. Full-fat cheeses or blue cheese. Nondairy creamers and whipped toppings. Processed cheese, cheese spreads, or cheese curds. Condiments Onion and garlic salt, seasoned salt, table salt, and sea salt. Canned and packaged gravies. Worcestershire sauce. Tartar sauce. Barbecue sauce. Teriyaki sauce. Soy sauce, including reduced sodium. Steak sauce. Fish sauce. Oyster sauce. Cocktail sauce. Horseradish. Ketchup and mustard. Meat flavorings and tenderizers. Bouillon cubes. Hot sauce. Tabasco sauce. Marinades. Taco seasonings. Relishes. Fats and Oils Butter, stick margarine, lard, shortening, ghee, and bacon fat. Coconut, palm kernel, or palm oils. Regular salad dressings. Other Pickles and olives. Salted popcorn and pretzels. The items listed above may not be a complete list of foods and beverages to avoid. Contact your dietitian for more information. WHERE CAN I FIND MORE INFORMATION? National [employer], [employer], [employer]: www.nhlbi.nih.gov/health/health-topics/topics/dash/ Document Released: 01/17/2011 Document Revised: 06/14/2013 Document Reviewed: 12/02/2012 Exit[employer] Patient Information 2015 [employer]. This information is not intended to replace advice given to you by your health care provider. Make sure you discuss any questions you have with your health care provider. Basic Carbohydrate Counting for Diabetes Mellitus Carbohydrate counting is a method for keeping track of the amount of carbohydrates you eat. Eating carbohydrates naturally increases the level of sugar (glucose) in your blood, so it is important for you to know the amount that is okay for you to have in every meal. Carbohydrate counting helps keep the level of glucose in your blood within normal limits. The amount of carbohydrates allowed is  different for every person. A dietitian can help you calculate the amount that is right for you. Once you know the amount of carbohydrates you can have, you can count the carbohydrates in the foods you want to eat. Carbohydrates are found in the following foods:  Grains, such as breads and cereals.  Dried beans and soy products.  Starchy vegetables, such as potatoes, peas, and corn.  Fruit and fruit juices.  Milk and yogurt.  Sweets and snack foods, such as cake, cookies, candy, chips, soft drinks, and fruit drinks. CARBOHYDRATE COUNTING There are two ways to count the carbohydrates in your food. You can use either of the methods or a combination of both. Reading the "Nutrition Facts" on Packaged Food The "Nutrition Facts" is an area that is included on the labels of almost all packaged food and beverages in the [place]. It includes the serving size of that food or beverage and information about the nutrients in each serving of the food, including the grams (g) of carbohydrate per serving.  Decide the number of servings of this food or beverage that you will be able to eat or drink. Multiply that number of servings by the number of grams of carbohydrate that is listed on the label for that serving. The total will be the amount of carbohydrates you will be having when you eat or drink this food or beverage. Learning Standard Serving Sizes of Food When you eat   food that is not packaged or does not include "Nutrition Facts" on the label, you need to measure the servings in order to count the amount of carbohydrates.A serving of most carbohydrate-rich foods contains about 15 g of carbohydrates. The following list includes serving sizes of carbohydrate-rich foods that provide 15 g ofcarbohydrate per serving:   1 slice of bread (1 oz) or 1 six-inch tortilla.    of a hamburger bun or English muffin.  4-6 crackers.   cup unsweetened dry cereal.    cup hot cereal.   cup rice or  pasta.    cup mashed potatoes or  of a large baked potato.  1 cup fresh fruit or one small piece of fruit.    cup canned or frozen fruit or fruit juice.  1 cup milk.   cup plain fat-free yogurt or yogurt sweetened with artificial sweeteners.   cup cooked dried beans or starchy vegetable, such as peas, corn, or potatoes.  Decide the number of standard-size servings that you will eat. Multiply that number of servings by 15 (the grams of carbohydrates in that serving). For example, if you eat 2 cups of strawberries, you will have eaten 2 servings and 30 g of carbohydrates (2 servings x 15 g = 30 g). For foods such as soups and casseroles, in which more than one food is mixed in, you will need to count the carbohydrates in each food that is included. EXAMPLE OF CARBOHYDRATE COUNTING Sample Dinner  3 oz chicken breast.   cup of brown rice.   cup of corn.  1 cup milk.   1 cup strawberries with sugar-free whipped topping.  Carbohydrate Calculation Step 1: Identify the foods that contain carbohydrates:   Rice.   Corn.   Milk.   Strawberries. Step 2:Calculate the number of servings eaten of each:   2 servings of rice.   1 serving of corn.   1 serving of milk.   1 serving of strawberries. Step 3: Multiply each of those number of servings by 15 g:   2 servings of rice x 15 g = 30 g.   1 serving of corn x 15 g = 15 g.   1 serving of milk x 15 g = 15 g.   1 serving of strawberries x 15 g = 15 g. Step 4: Add together all of the amounts to find the total grams of carbohydrates eaten: 30 g + 15 g + 15 g + 15 g = 75 g. Document Released: 01/28/2005 Document Revised: 06/14/2013 Document Reviewed: 12/25/2012 [employer] Patient Information 2015 [employer]. This information is not intended to replace advice given to you by your health care provider. Make sure you discuss any questions you have with your health care provider. Diabetes and  Exercise Exercising regularly is important. It is not just about losing weight. It has many health benefits, such as:  Improving your overall fitness, flexibility, and endurance.  Increasing your bone density.  Helping with weight control.  Decreasing your body fat.  Increasing your muscle strength.  Reducing stress and tension.  Improving your overall health. People with diabetes who exercise gain additional benefits because exercise:  Reduces appetite.  Improves the body's use of blood sugar (glucose).  Helps lower or control blood glucose.  Decreases blood pressure.  Helps control blood lipids (such as cholesterol and triglycerides).  Improves the body's use of the hormone insulin by:  Increasing the body's insulin sensitivity.  Reducing the body's insulin needs.  Decreases the   risk for heart disease because exercising:  Lowers cholesterol and triglycerides levels.  Increases the levels of good cholesterol (such as high-density lipoproteins [HDL]) in the body.  Lowers blood glucose levels. YOUR ACTIVITY PLAN  Choose an activity that you enjoy and set realistic goals. Your health care provider or diabetes educator can help you make an activity plan that works for you. Exercise regularly as directed by your health care provider. This includes:  Performing resistance training twice a week such as push-ups, sit-ups, lifting weights, or using resistance bands.  Performing 150 minutes of cardio exercises each week such as walking, running, or playing sports.  Staying active and spending no more than 90 minutes at one time being inactive. Even short bursts of exercise are good for you. Three 10-minute sessions spread throughout the day are just as beneficial as a single 30-minute session. Some exercise ideas include:  Taking the dog for a walk.  Taking the stairs instead of the elevator.  Dancing to your favorite song.  Doing an exercise video.  Doing your  favorite exercise with a friend. RECOMMENDATIONS FOR EXERCISING WITH TYPE 1 OR TYPE 2 DIABETES   Check your blood glucose before exercising. If blood glucose levels are greater than 240 mg/dL, check for urine ketones. Do not exercise if ketones are present.  Avoid injecting insulin into areas of the body that are going to be exercised. For example, avoid injecting insulin into:  The arms when playing tennis.  The legs when jogging.  Keep a record of:  Food intake before and after you exercise.  Expected peak times of insulin action.  Blood glucose levels before and after you exercise.  The type and amount of exercise you have done.  Review your records with your health care provider. Your health care provider will help you to develop guidelines for adjusting food intake and insulin amounts before and after exercising.  If you take insulin or oral hypoglycemic agents, watch for signs and symptoms of hypoglycemia. They include:  Dizziness.  Shaking.  Sweating.  Chills.  Confusion.  Drink plenty of water while you exercise to prevent dehydration or heat stroke. Body water is lost during exercise and must be replaced.  Talk to your health care provider before starting an exercise program to make sure it is safe for you. Remember, almost any type of activity is better than none. Document Released: 04/20/2003 Document Revised: 06/14/2013 Document Reviewed: 07/07/2012 Exit[employer] Patient Information 2015 [employer]. This information is not intended to replace advice given to you by your health care provider. Make sure you discuss any questions you have with your health care provider.  

## 2014-02-01 LAB — VITAMIN D 25 HYDROXY (VIT D DEFICIENCY, FRACTURES): Vit D, 25-Hydroxy: 16 ng/mL — ABNORMAL LOW (ref 30–100)

## 2014-02-01 LAB — TSH: TSH: 1.186 u[IU]/mL (ref 0.350–4.500)

## 2014-02-17 ENCOUNTER — Telehealth: Payer: Self-pay | Admitting: Emergency Medicine

## 2014-02-17 MED ORDER — VITAMIN D (ERGOCALCIFEROL) 1.25 MG (50000 UNIT) PO CAPS: 50000.0000 [IU] | ORAL_CAPSULE | ORAL | Status: DC

## 2014-02-17 NOTE — Telephone Encounter (Signed)
Left message with lab results and medication instructions to start taking Vitamin D 50,000 units x 12 weeks  Medication e-scribed to [facility]

## 2014-02-17 NOTE — Telephone Encounter (Signed)
-----   Message from Tresa Garter, MD sent at 02/06/2014  3:05 PM EST ----- Please inform patient had laboratory test results shows very low vitamin D level, other results are within normal limits including her thyroid function. We'll treat her low vitamin D level with medication.  Please call in prescription ergocalciferol 50,000 unit weekly for 12 weeks

## 2014-02-21 ENCOUNTER — Other Ambulatory Visit: Payer: Self-pay | Admitting: Emergency Medicine

## 2014-02-21 MED ORDER — CLOPIDOGREL BISULFATE 75 MG PO TABS: 75.0000 mg | ORAL_TABLET | Freq: Every day | ORAL | Status: DC

## 2014-02-28 ENCOUNTER — Ambulatory Visit: Payer: No Typology Code available for payment source | Attending: Internal Medicine

## 2014-03-09 ENCOUNTER — Ambulatory Visit: Payer: No Typology Code available for payment source | Attending: Internal Medicine

## 2014-03-25 ENCOUNTER — Telehealth: Payer: Self-pay | Admitting: Internal Medicine

## 2014-03-25 NOTE — Telephone Encounter (Signed)
Pt came in today to pick up paper work.

## 2014-04-15 ENCOUNTER — Other Ambulatory Visit: Payer: Self-pay | Admitting: Internal Medicine

## 2014-04-15 DIAGNOSIS — E038 Other specified hypothyroidism: Secondary | ICD-10-CM

## 2014-04-15 MED ORDER — LEVOTHYROXINE SODIUM 137 MCG PO TABS
137.0000 ug | ORAL_TABLET | Freq: Every day | ORAL | Status: DC
Start: 2014-04-15 — End: 2015-06-08

## 2014-04-29 ENCOUNTER — Telehealth: Payer: Self-pay | Admitting: General Practice

## 2014-04-29 NOTE — Telephone Encounter (Signed)
Patient presents to clinic stating that at the time of her last visit, PCP changed one of her prescriptions and since then she has been experiencing tingling in hands and feet, and a burning sensation in throat. Please contact patient to discuss. Patient will call back to schedule an appointment next week. No appointments are available at this time.

## 2014-05-02 ENCOUNTER — Telehealth: Payer: Self-pay | Admitting: Emergency Medicine

## 2014-05-02 NOTE — Telephone Encounter (Signed)
Patient presents to clinic stating that at the time of her last visit, PCP changed one of her prescriptions and since then she has been experiencing tingling in hands and feet, and a burning sensation in throat. Please contact patient to discuss. Patient will call back to schedule an appointment next week. No appointments are available at this time.            Pt states since prescribed Plavix 2 mnths ago she noticed increased sx's numbness/tingling in hands/feet with swelling Pt instructed to stop taking medication and come to clinic during walk- in hours Denies chest pain or bleeding

## 2014-06-16 ENCOUNTER — Ambulatory Visit: Payer: Self-pay | Attending: Internal Medicine | Admitting: Internal Medicine

## 2014-06-16 ENCOUNTER — Encounter: Payer: Self-pay | Admitting: Internal Medicine

## 2014-06-16 VITALS — BP 168/87 | HR 63 | Temp 97.8°F | Resp 15 | Wt 268.0 lb

## 2014-06-16 DIAGNOSIS — E669 Obesity, unspecified: Secondary | ICD-10-CM | POA: Insufficient documentation

## 2014-06-16 DIAGNOSIS — I10 Essential (primary) hypertension: Secondary | ICD-10-CM | POA: Insufficient documentation

## 2014-06-16 DIAGNOSIS — E119 Type 2 diabetes mellitus without complications: Secondary | ICD-10-CM | POA: Insufficient documentation

## 2014-06-16 DIAGNOSIS — R7303 Prediabetes: Secondary | ICD-10-CM

## 2014-06-16 DIAGNOSIS — Z8673 Personal history of transient ischemic attack (TIA), and cerebral infarction without residual deficits: Secondary | ICD-10-CM | POA: Insufficient documentation

## 2014-06-16 DIAGNOSIS — R7309 Other abnormal glucose: Secondary | ICD-10-CM

## 2014-06-16 DIAGNOSIS — H538 Other visual disturbances: Secondary | ICD-10-CM | POA: Insufficient documentation

## 2014-06-16 DIAGNOSIS — Z7982 Long term (current) use of aspirin: Secondary | ICD-10-CM | POA: Insufficient documentation

## 2014-06-16 DIAGNOSIS — Z1239 Encounter for other screening for malignant neoplasm of breast: Secondary | ICD-10-CM

## 2014-06-16 DIAGNOSIS — J302 Other seasonal allergic rhinitis: Secondary | ICD-10-CM | POA: Insufficient documentation

## 2014-06-16 DIAGNOSIS — E039 Hypothyroidism, unspecified: Secondary | ICD-10-CM | POA: Insufficient documentation

## 2014-06-16 DIAGNOSIS — E038 Other specified hypothyroidism: Secondary | ICD-10-CM | POA: Insufficient documentation

## 2014-06-16 DIAGNOSIS — T45525A Adverse effect of antithrombotic drugs, initial encounter: Secondary | ICD-10-CM | POA: Insufficient documentation

## 2014-06-16 LAB — TSH: TSH: 2.098 u[IU]/mL (ref 0.350–4.500)

## 2014-06-16 LAB — POCT GLYCOSYLATED HEMOGLOBIN (HGB A1C): HEMOGLOBIN A1C: 6.7

## 2014-06-16 LAB — LIPID PANEL
CHOL/HDL RATIO: 3.4 ratio
Cholesterol: 166 mg/dL (ref 0–200)
HDL: 49 mg/dL (ref >=46)
LDL CALC: 98 mg/dL (ref 0–99)
TRIGLYCERIDES: 95 mg/dL (ref <150)
VLDL: 19 mg/dL (ref 0–40)

## 2014-06-16 LAB — T4, FREE: FREE T4: 1.07 ng/dL (ref 0.80–1.80)

## 2014-06-16 MED ORDER — GUAIFENESIN-DM 100-10 MG/5ML PO SYRP: 5.0000 mL | ORAL_SOLUTION | Freq: Three times a day (TID) | ORAL | Status: DC | PRN

## 2014-06-16 MED ORDER — ASPIRIN 325 MG PO TBEC: 325.0000 mg | DELAYED_RELEASE_TABLET | Freq: Every day | ORAL | Status: DC

## 2014-06-16 MED ORDER — LORATADINE 10 MG PO TABS: 10.0000 mg | ORAL_TABLET | Freq: Every day | ORAL | Status: DC

## 2014-06-16 NOTE — Progress Notes (Signed)
Patient here for follow up Patient states she stopped taking her plavix about a month ago Due to the fact she was having a reaction to the medicine Patient states the medication was causing her to have tingling in her arms and legs  And some swelling to her feet. Patient states the medicine also caused some burning in her throat Since stopping the medications she states she is feeling better and has Added 81mg  baby aspirin to her list that she takes

## 2014-06-16 NOTE — Patient Instructions (Signed)
Hypothyroidism The thyroid is a large gland located in the lower front of your neck. The thyroid gland helps control metabolism. Metabolism is how your body handles food. It controls metabolism with the hormone thyroxine. When this gland is underactive (hypothyroid), it produces too little hormone.  CAUSES These include:   Absence or destruction of thyroid tissue.  Goiter due to iodine deficiency.  Goiter due to medications.  Congenital defects (since birth).  Problems with the pituitary. This causes a lack of TSH (thyroid stimulating hormone). This hormone tells the thyroid to turn out more hormone. SYMPTOMS  Lethargy (feeling as though you have no energy)  Cold intolerance  Weight gain (in spite of normal food intake)  Dry skin  Coarse hair  Menstrual irregularity (if severe, may lead to infertility)  Slowing of thought processes Cardiac problems are also caused by insufficient amounts of thyroid hormone. Hypothyroidism in the newborn is cretinism, and is an extreme form. It is important that this form be treated adequately and immediately or it will lead rapidly to retarded physical and mental development. DIAGNOSIS  To prove hypothyroidism, your caregiver may do blood tests and ultrasound tests. Sometimes the signs are hidden. It may be necessary for your caregiver to watch this illness with blood tests either before or after diagnosis and treatment. TREATMENT  Low levels of thyroid hormone are increased by using synthetic thyroid hormone. This is a safe, effective treatment. It usually takes about four weeks to gain the full effects of the medication. After you have the full effect of the medication, it will generally take another four weeks for problems to leave. Your caregiver may start you on low doses. If you have had heart problems the dose may be gradually increased. It is generally not an emergency to get rapidly to normal. HOME CARE INSTRUCTIONS   Take your  medications as your caregiver suggests. Let your caregiver know of any medications you are taking or start taking. Your caregiver will help you with dosage schedules.  As your condition improves, your dosage needs may increase. It will be necessary to have continuing blood tests as suggested by your caregiver.  Report all suspected medication side effects to your caregiver. SEEK MEDICAL CARE IF: Seek medical care if you develop:  Sweating.  Tremulousness (tremors).  Anxiety.  Rapid weight loss.  Heat intolerance.  Emotional swings.  Diarrhea.  Weakness. SEEK IMMEDIATE MEDICAL CARE IF:  You develop chest pain, an irregular heart beat (palpitations), or a rapid heart beat. MAKE SURE YOU:   Understand these instructions.  Will watch your condition.  Will get help right away if you are not doing well or get worse. Document Released: 01/28/2005 Document Revised: 04/22/2011 Document Reviewed: 09/18/2007 ExitCare Patient Information 2015 [employer]. This information is not intended to replace advice given to you by your health care provider. Make sure you discuss any questions you have with your health care provider. DASH Eating Plan DASH stands for "Dietary Approaches to Stop Hypertension." The DASH eating plan is a healthy eating plan that has been shown to reduce high blood pressure (hypertension). Additional health benefits may include reducing the risk of type 2 diabetes mellitus, heart disease, and stroke. The DASH eating plan may also help with weight loss. WHAT DO I NEED TO KNOW ABOUT THE DASH EATING PLAN? For the DASH eating plan, you will follow these general guidelines:  Choose foods with a percent daily value for sodium of less than 5% (as listed on the food label).    Use salt-free seasonings or herbs instead of table salt or sea salt.  Check with your health care provider or pharmacist before using salt substitutes.  Eat lower-sodium products, often labeled as  "lower sodium" or "no salt added."  Eat fresh foods.  Eat more vegetables, fruits, and low-fat dairy products.  Choose whole grains. Look for the word "whole" as the first word in the ingredient list.  Choose fish and skinless chicken or turkey more often than red meat. Limit fish, poultry, and meat to 6 oz (170 g) each day.  Limit sweets, desserts, sugars, and sugary drinks.  Choose heart-healthy fats.  Limit cheese to 1 oz (28 g) per day.  Eat more home-cooked food and less restaurant, buffet, and fast food.  Limit fried foods.  Cook foods using methods other than frying.  Limit canned vegetables. If you do use them, rinse them well to decrease the sodium.  When eating at a restaurant, ask that your food be prepared with less salt, or no salt if possible. WHAT FOODS CAN I EAT? Seek help from a dietitian for individual calorie needs. Grains Whole grain or whole wheat bread. Brown rice. Whole grain or whole wheat pasta. Quinoa, bulgur, and whole grain cereals. Low-sodium cereals. Corn or whole wheat flour tortillas. Whole grain cornbread. Whole grain crackers. Low-sodium crackers. Vegetables Fresh or frozen vegetables (raw, steamed, roasted, or grilled). Low-sodium or reduced-sodium tomato and vegetable juices. Low-sodium or reduced-sodium tomato sauce and paste. Low-sodium or reduced-sodium canned vegetables.  Fruits All fresh, canned (in natural juice), or frozen fruits. Meat and Other Protein Products Ground beef (85% or leaner), grass-fed beef, or beef trimmed of fat. Skinless chicken or turkey. Ground chicken or turkey. Pork trimmed of fat. All fish and seafood. Eggs. Dried beans, peas, or lentils. Unsalted nuts and seeds. Unsalted canned beans. Dairy Low-fat dairy products, such as skim or 1% milk, 2% or reduced-fat cheeses, low-fat ricotta or cottage cheese, or plain low-fat yogurt. Low-sodium or reduced-sodium cheeses. Fats and Oils Tub margarines without trans fats.  Light or reduced-fat mayonnaise and salad dressings (reduced sodium). Avocado. Safflower, olive, or canola oils. Natural peanut or almond butter. Other Unsalted popcorn and pretzels. The items listed above may not be a complete list of recommended foods or beverages. Contact your dietitian for more options. WHAT FOODS ARE NOT RECOMMENDED? Grains White bread. White pasta. White rice. Refined cornbread. Bagels and croissants. Crackers that contain trans fat. Vegetables Creamed or fried vegetables. Vegetables in a cheese sauce. Regular canned vegetables. Regular canned tomato sauce and paste. Regular tomato and vegetable juices. Fruits Dried fruits. Canned fruit in light or heavy syrup. Fruit juice. Meat and Other Protein Products Fatty cuts of meat. Ribs, chicken wings, bacon, sausage, bologna, salami, chitterlings, fatback, hot dogs, bratwurst, and packaged luncheon meats. Salted nuts and seeds. Canned beans with salt. Dairy Whole or 2% milk, cream, half-and-half, and cream cheese. Whole-fat or sweetened yogurt. Full-fat cheeses or blue cheese. Nondairy creamers and whipped toppings. Processed cheese, cheese spreads, or cheese curds. Condiments Onion and garlic salt, seasoned salt, table salt, and sea salt. Canned and packaged gravies. Worcestershire sauce. Tartar sauce. Barbecue sauce. Teriyaki sauce. Soy sauce, including reduced sodium. Steak sauce. Fish sauce. Oyster sauce. Cocktail sauce. Horseradish. Ketchup and mustard. Meat flavorings and tenderizers. Bouillon cubes. Hot sauce. Tabasco sauce. Marinades. Taco seasonings. Relishes. Fats and Oils Butter, stick margarine, lard, shortening, ghee, and bacon fat. Coconut, palm kernel, or palm oils. Regular salad dressings. Other Pickles and olives. Salted popcorn and   pretzels. The items listed above may not be a complete list of foods and beverages to avoid. Contact your dietitian for more information. WHERE CAN I FIND MORE  INFORMATION? National Heart, Lung, and Blood Institute: www.nhlbi.nih.gov/health/health-topics/topics/dash/ Document Released: 01/17/2011 Document Revised: 06/14/2013 Document Reviewed: 12/02/2012 ExitCare Patient Information 2015 [employer]. This information is not intended to replace advice given to you by your health care provider. Make sure you discuss any questions you have with your health care provider. Hypertension Hypertension, commonly called high blood pressure, is when the force of blood pumping through your arteries is too strong. Your arteries are the blood vessels that carry blood from your heart throughout your body. A blood pressure reading consists of a higher number over a lower number, such as 110/72. The higher number (systolic) is the pressure inside your arteries when your heart pumps. The lower number (diastolic) is the pressure inside your arteries when your heart relaxes. Ideally you want your blood pressure below 120/80. Hypertension forces your heart to work harder to pump blood. Your arteries may become narrow or stiff. Having hypertension puts you at risk for heart disease, stroke, and other problems.  RISK FACTORS Some risk factors for high blood pressure are controllable. Others are not.  Risk factors you cannot control include:   Race. You may be at higher risk if you are African American.  Age. Risk increases with age.  Gender. Men are at higher risk than women before age 45 years. After age 65, women are at higher risk than men. Risk factors you can control include:  Not getting enough exercise or physical activity.  Being overweight.  Getting too much fat, sugar, calories, or salt in your diet.  Drinking too much alcohol. SIGNS AND SYMPTOMS Hypertension does not usually cause signs or symptoms. Extremely high blood pressure (hypertensive crisis) may cause headache, anxiety, shortness of breath, and nosebleed. DIAGNOSIS  To check if you have  hypertension, your health care provider will measure your blood pressure while you are seated, with your arm held at the level of your heart. It should be measured at least twice using the same arm. Certain conditions can cause a difference in blood pressure between your right and left arms. A blood pressure reading that is higher than normal on one occasion does not mean that you need treatment. If one blood pressure reading is high, ask your health care provider about having it checked again. TREATMENT  Treating high blood pressure includes making lifestyle changes and possibly taking medicine. Living a healthy lifestyle can help lower high blood pressure. You may need to change some of your habits. Lifestyle changes may include:  Following the DASH diet. This diet is high in fruits, vegetables, and whole grains. It is low in salt, red meat, and added sugars.  Getting at least 2 hours of brisk physical activity every week.  Losing weight if necessary.  Not smoking.  Limiting alcoholic beverages.  Learning ways to reduce stress. If lifestyle changes are not enough to get your blood pressure under control, your health care provider may prescribe medicine. You may need to take more than one. Work closely with your health care provider to understand the risks and benefits. HOME CARE INSTRUCTIONS  Have your blood pressure rechecked as directed by your health care provider.   Take medicines only as directed by your health care provider. Follow the directions carefully. Blood pressure medicines must be taken as prescribed. The medicine does not work as well when you skip   doses. Skipping doses also puts you at risk for problems.   Do not smoke.   Monitor your blood pressure at home as directed by your health care provider. SEEK MEDICAL CARE IF:   You think you are having a reaction to medicines taken.  You have recurrent headaches or feel dizzy.  You have swelling in your  ankles.  You have trouble with your vision. SEEK IMMEDIATE MEDICAL CARE IF:  You develop a severe headache or confusion.  You have unusual weakness, numbness, or feel faint.  You have severe chest or abdominal pain.  You vomit repeatedly.  You have trouble breathing. MAKE SURE YOU:   Understand these instructions.  Will watch your condition.  Will get help right away if you are not doing well or get worse. Document Released: 01/28/2005 Document Revised: 06/14/2013 Document Reviewed: 11/20/2012 ExitCare Patient Information 2015 [employer]. This information is not intended to replace advice given to you by your health care provider. Make sure you discuss any questions you have with your health care provider.  

## 2014-06-16 NOTE — Progress Notes (Addendum)
Subjective:     [name], female   DOB: Jan 05, 1956, 59 y.o.   MRN: [id number]  HPI  Tiffany Strickland is a 59 yo woman with history of stroke in 2004, essential HTN, prediabetes, and hypothyroidism here today for a follow-up visit.  Patient reports taking all of her medications everyday, although she did not take her BP medication today. She eats a diet low in salt and sugar and recently joined a gym and has been more consistently exercising. Denies headaches, blurry vision, no urinary symptom. She notes her mother has a history of eye surgeries, cataracts, glaucoma, and is visually impaired. Patient usually sees eye doctor every year due to the family history of visual problems, and would like a referral to the eye doctor today.  Patient used to take Aggrenox, but due to insurance changes was switched to Plavix several months ago. She stopped taking Plavix within a month of starting it because of undesirable side effects including burning throat and tingling, cramping and bruising in both hands and feet. All of these symptoms resolved after she discontinued Plavix and resumed taking aspirin instead.   She also notes her allergies seems to have gotten worse, and she's had a dry cough for the past few weeks and occasionally feels like her throat "closes up". Denies ever using tobacco.  Denies fever, SOB, DOE, chest pain, palpitations.   Patient had mammogram in April 2015, wants to know if she needs to have another one this year. Notes having colonoscopy last year as well. Denies family history of breast cancer. Denies nausea, vomiting, diarrhea, constipation, blood in stool.    Active Ambulatory Problems    Diagnosis Date Noted  . Diabetes 08/10/2012  . HTN (hypertension) 01/15/2013  . Unspecified constipation 04/15/2013  . Rectal bleeding 04/15/2013  . Lateral epicondylitis of right elbow 07/21/2013  . Right shoulder pain 07/21/2013  . Sprain of wrist, right 07/21/2013  .  Nonspecific (abnormal) findings on radiological and other examination of gastrointestinal tract 08/12/2013  . Essential hypertension 08/24/2013  . Stroke 01/31/2014  . Prediabetes 06/16/2014  . Blurry vision, bilateral 06/16/2014  . Breast cancer screening 06/16/2014  . Other specified hypothyroidism 06/16/2014  . Other seasonal allergic rhinitis 06/16/2014   Resolved Ambulatory Problems    Diagnosis Date Noted  . No Resolved Ambulatory Problems   Past Medical History  Diagnosis Date  . Hypertension   . Thyroid disease   . Hyperlipidemia   . Diabetes mellitus without complication   . Colon polyp     Review of Systems  Constitutional: Negative for activity change and fatigue.  Eyes: Negative for visual disturbance.  Respiratory: Positive for cough. Negative for chest tightness and shortness of breath.   Cardiovascular: Positive for leg swelling. Negative for chest pain and palpitations.  Gastrointestinal: Negative for nausea, vomiting, abdominal pain, diarrhea, constipation and blood in stool.  Endocrine: Negative for cold intolerance and heat intolerance.  Genitourinary: Negative for urgency, hematuria and difficulty urinating.  Musculoskeletal: Negative for joint swelling.  Neurological: Negative for dizziness, light-headedness, numbness and headaches.   Objective: Filed Vitals:   06/16/14 0944  BP: 168/87  Pulse: 63  Temp: 97.8 F (36.6 C)  Resp: 15      Physical Exam  Constitutional:  Well appearing, pleasant, obese 59 yo AA woman alert, oriented, and in no acute distress.  HENT:  Head: Normocephalic and atraumatic.  Right Ear: External ear normal.  Left Ear: External ear normal.  Eyes: Pupils are equal, round, and  reactive to light.  Neck: Normal range of motion. Neck supple. No thyromegaly present.  Cardiovascular: Normal rate, regular rhythm, normal heart sounds and intact distal pulses.   Pulmonary/Chest: Effort normal and breath sounds normal.   Musculoskeletal: Normal range of motion. She exhibits edema.   Assessment & Plan:   Tiffany Strickland is a 59 yo woman with history of stroke in 2004, essential HTN, prediabetes, and hypothyroidism here today for a follow-up visit.  Type 2 Diabetes Patient has history of prediabetes and A1C in clinic today was 6.7. Patient refuses being started on metformin for diabetes and instead wants to work on diet and exercise. Counseled patient on importance of maintaining low sugar diet and exercise and need to start medication in the future if A1C is poorly controlled.    Aim for 2-3 Carb Choices per meal (30-45 grams) +/- 1 either way  Aim for 0-15 Carbs per snack if hungry  Include protein in moderation with your meals and snacks  Consider reading food labels for Total Carbohydrate and Fat Grams of foods  Consider checking BG at alternate times per day  Continue taking medication as directed Fruit Punch - find one with no sugar  Measure and decrease portions of carbohydrate foods  Make your plate and don't go back for seconds   Intolerance to Plavix Will increase dose of patient's aspirin since patient was not able to tolerate Plavix.      - aspirin EC 325 MG EC tablet; Take 1 tablet (325 mg total) by mouth daily.   Essential Hypertension Patient's BP in clinic today was 168/87, but she did not take her BP medication today; therefore will not adjust medication dosage at this time. Scheduled patient for follow up nurse visit in 2 week to recheck BP. Counseled patient on DASH diet, exercise, and daily medication adherence to maintain well-controlled BP. Will order lipid panel to check cholesterol level.  Vision Changes Will refer patient to ophthalmology for annual eye exam, given family history of visual problems.   Breast cancer screening Patient's last mammogram was April 2015, no mammographic evidence of malignancy. Will schedule patient for annual mammogram.   Hypothyroidism  Patient  does not report any symptoms concerning for poorly controlled hypothyroidism. Will order TSH and free T4 to check thyroid function.   Seasonal Allergic Rhinitis Will order following for symptom relief from allergies:  -     loratadine (CLARITIN) 10 MG tablet; Take 1 tablet (10 mg total) by mouth daily. -     guaiFENesin-dextromethorphan (ROBITUSSIN DM) 100-10 MG/5ML syrup; Take 5 mLs by mouth every 8 (eight) hours as needed for cough.    Evaluation and management procedures were performed by me with Medical Student in attendance, note written by medical student under my supervision and collaboration. I have reviewed the note and I agree with the management and plan.   Angelica Chessman, MD, [place], [place], [place], [place] and [place], [place]   06/16/2014, 1:06 PM

## 2014-08-05 ENCOUNTER — Telehealth: Payer: Self-pay | Admitting: Internal Medicine

## 2014-08-05 NOTE — Telephone Encounter (Signed)
Is not a referral for opthalmology and since she has the orange card the waiting  can be 6 months

## 2014-08-05 NOTE — Telephone Encounter (Signed)
Pt mentioned to be referred to eye specialist for an in depth eye exam and to obtain glasses. Patient has the orange card and cone discount letter. Please follow up with pt.

## 2014-08-24 NOTE — Telephone Encounter (Signed)
Patient came in to [facility] wanting lab results from May and to ask about opthalmology referral. Patient verified date of birth.  Nurse spoke with Diane in [place]. Diane informed nurse wait for ophthalmology referral is 6 months. Diane informed nurse of option for patient to pay out of pocket for eye exam and brign written prescription to [facility]err[facility] in referrals. Alinda Sierras can then send prescription to have approved by orange card and get patient voucher for eye glasses.  Patient aware of the following recommendations from Dr. Doreene Burke: This inform patient that her laboratory test results are mostly within normal limits including her cholesterol level. But her hemoglobin A1c shows that she is diabetic. We will need to start her on medication for diabetes and monitor blood sugar closely. If blood sugar is controlled and hemoglobin A1c comes down to below 6 in 3 months we may stop the medication and watch  His call in prescription for metformin 500 mg tablet by mouth twice a day, 90 tablets with 3 refills.   Aim for 2-3 Carb Choices per meal (30-45 grams) +/- 1 either way  Aim for 0-15 Carbs per snack if hungry  Include protein in moderation with your meals and snacks  Consider reading food labels for Total Carbohydrate and Fat Grams of foods  Consider checking BG at alternate times per day  Continue taking medication as directed Fruit Punch - find one with no sugar  Measure and decrease portions of carbohydrate foods  Make your plate and don't go back for seconds  Patient refuses to take metformin and explains "I can do it with diet and exercise. I have done it before".  Patient aware of need for follow up appointment in November, 2016. Patient voices understanding and has no further questions at this time.

## 2014-09-16 ENCOUNTER — Telehealth: Payer: Self-pay | Admitting: Internal Medicine

## 2014-09-16 NOTE — Telephone Encounter (Signed)
Patient called asking about the "[employer]". She is interested and would like to learn more and apply. She said this program is for seniors who are interested in exercising at the [facility] without having to pay for the classes. Please follow up with pt as she is interested in the details of this program so that she can apply. Thank you.

## 2014-09-26 ENCOUNTER — Ambulatory Visit (HOSPITAL_COMMUNITY): Payer: Self-pay | Attending: Internal Medicine

## 2014-11-11 ENCOUNTER — Encounter (HOSPITAL_COMMUNITY): Payer: Self-pay | Admitting: Emergency Medicine

## 2014-11-11 ENCOUNTER — Emergency Department (HOSPITAL_COMMUNITY)
Admission: EM | Admit: 2014-11-11 | Discharge: 2014-11-11 | Disposition: A | Discharge status: Home / Self Care | Payer: Self-pay | Attending: Emergency Medicine | Admitting: Emergency Medicine

## 2014-11-11 DIAGNOSIS — E079 Disorder of thyroid, unspecified: Secondary | ICD-10-CM | POA: Insufficient documentation

## 2014-11-11 DIAGNOSIS — M1990 Unspecified osteoarthritis, unspecified site: Secondary | ICD-10-CM | POA: Insufficient documentation

## 2014-11-11 DIAGNOSIS — Z79899 Other long term (current) drug therapy: Secondary | ICD-10-CM | POA: Insufficient documentation

## 2014-11-11 DIAGNOSIS — Z86010 Personal history of colonic polyps: Secondary | ICD-10-CM | POA: Insufficient documentation

## 2014-11-11 DIAGNOSIS — M79669 Pain in unspecified lower leg: Secondary | ICD-10-CM | POA: Insufficient documentation

## 2014-11-11 DIAGNOSIS — I10 Essential (primary) hypertension: Secondary | ICD-10-CM | POA: Insufficient documentation

## 2014-11-11 DIAGNOSIS — M25561 Pain in right knee: Secondary | ICD-10-CM | POA: Insufficient documentation

## 2014-11-11 DIAGNOSIS — Z7902 Long term (current) use of antithrombotics/antiplatelets: Secondary | ICD-10-CM | POA: Insufficient documentation

## 2014-11-11 DIAGNOSIS — E119 Type 2 diabetes mellitus without complications: Secondary | ICD-10-CM | POA: Insufficient documentation

## 2014-11-11 DIAGNOSIS — Z8673 Personal history of transient ischemic attack (TIA), and cerebral infarction without residual deficits: Secondary | ICD-10-CM | POA: Insufficient documentation

## 2014-11-11 DIAGNOSIS — Z7982 Long term (current) use of aspirin: Secondary | ICD-10-CM | POA: Insufficient documentation

## 2014-11-11 LAB — CBG MONITORING, ED: Glucose-Capillary: 133 mg/dL — ABNORMAL HIGH (ref 65–99)

## 2014-11-11 MED ORDER — TRAMADOL HCL 50 MG PO TABS: 50.0000 mg | ORAL_TABLET | Freq: Four times a day (QID) | ORAL | Status: DC | PRN

## 2014-11-11 NOTE — ED Notes (Signed)
Pt from home c/o right knee pain since yesterday. She denies injury. She reports HX of Arthritis. She denies taking medications at home for pain relief.

## 2014-11-11 NOTE — Discharge Instructions (Signed)
Take Tramadol as needed for pain. Refer to attached documents for more information. Follow up with [facility] for primary care.

## 2014-11-11 NOTE — ED Provider Notes (Signed)
CSN: [id number]     Arrival date & time 11/11/14  5366 History   By signing my name below, I, Erling Conte, attest that this documentation has been prepared under the direction and in the presence of Alvina Chou, PA-C Electronically Signed: Erling Conte, ED Scribe. 11/11/2014. 9:20 PM.    Chief Complaint  Patient presents with  . Knee Pain    The history is provided by the patient. No language interpreter was used.    HPI Comments: Tiffany Strickland is a 59 y.o. female with a h/o arthritis, HTN, DM and stroke who presents to the Emergency Department complaining of intermittent, moderate, stabbing, right shin pain onset yesterday. She states the pain radiates up into her right knee. She denies any injury to the area. Pt endorses the pain is exacerbated with ambulation and applied pressure to the knee. She has not taken any meds PTA. She reports she has mild swelling to her lower legs at baseline. She denies being on her feet a lot. Pt reports she is not on any DM medications at this time. Pt admits she has not changed her diet very much since the diagnosis.  She does not regularly check her BS at home. She notes she has been having intermittent, cramping in the fingers of her right hand onset several months but she denies injury or known cause. Pt denies any chest pain, SOB, or increased leg swelling.  Past Medical History  Diagnosis Date  . Hypertension   . Stroke   . Thyroid disease   . Hyperlipidemia   . Diabetes mellitus without complication   . Colon polyp     adenomatous   Past Surgical History  Procedure Laterality Date  . Abdominal hysterectomy    . Tubal ligation    . Bladder suspension    . Knee surgery    . Eus N/A 08/12/2013    Procedure: LOWER ENDOSCOPIC ULTRASOUND (EUS);  Surgeon: Milus Banister, MD;  Location: [facility];  Service: Endoscopy;  Laterality: N/A;   Family History  Problem Relation Age of Onset  . Hypertension Mother   . Heart disease Maternal  Grandmother   . Cancer Maternal Grandmother     ovarian  . Cancer Maternal Grandfather   . Hypertension Other   . Hyperlipidemia Other   . Cancer Other   . Sleep apnea Other   . Obesity Other    Social History  Substance Use Topics  . Smoking status: Never Smoker   . Smokeless tobacco: Never Used  . Alcohol Use: No   OB History    Gravida Para Term Preterm AB TAB SAB Ectopic Multiple Living   6 5 5  1  1   5      Review of Systems  Respiratory: Negative for shortness of breath.   Cardiovascular: Positive for leg swelling (at baseline). Negative for chest pain.  Musculoskeletal: Positive for myalgias and arthralgias.  All other systems reviewed and are negative.     Allergies  Pollen extract; Tomato; and Wool alcohol  Home Medications   Prior to Admission medications   Medication Sig Start Date End Date Taking? Authorizing Provider  aspirin EC 325 MG EC tablet Take 1 tablet (325 mg total) by mouth daily. 06/16/14   Tresa Garter, MD  clopidogrel (PLAVIX) 75 MG tablet Take 1 tablet (75 mg total) by mouth daily. 02/21/14   Tresa Garter, MD  guaiFENesin-dextromethorphan (ROBITUSSIN DM) 100-10 MG/5ML syrup Take 5 mLs by mouth every 8 (eight) hours  as needed for cough. 06/16/14   Tresa Garter, MD  levothyroxine (SYNTHROID, LEVOTHROID) 137 MCG tablet Take 1 tablet (137 mcg total) by mouth daily before breakfast. 04/15/14   Tresa Garter, MD  lisinopril-hydrochlorothiazide (PRINZIDE,ZESTORETIC) 20-25 MG per tablet Take 1 tablet by mouth every morning. 01/31/14   Tresa Garter, MD  loratadine (CLARITIN) 10 MG tablet Take 1 tablet (10 mg total) by mouth daily. 06/16/14   Tresa Garter, MD  polyethylene glycol (MIRALAX / GLYCOLAX) packet Take 17 g by mouth daily. 01/31/14   Tresa Garter, MD  Vitamin D, Ergocalciferol, (DRISDOL) 50000 UNITS CAPS capsule Take 1 capsule (50,000 Units total) by mouth every 7 (seven) days. 02/17/14   Tresa Garter,  MD   Triage Vitals: BP 153/85 mmHg  Pulse 72  Temp(Src) 98 F (36.7 C) (Oral)  Resp 18  SpO2 98%  Physical Exam  Constitutional: She is oriented to person, place, and time. She appears well-developed and well-nourished. No distress.  HENT:  Head: Normocephalic and atraumatic.  Eyes: Conjunctivae and EOM are normal.  Neck: Neck supple. No tracheal deviation present.  Cardiovascular: Normal rate and regular rhythm.  Exam reveals no gallop and no friction rub.   No murmur heard. 2+ lower extremity pitting edema bilaterally.   Pulmonary/Chest: Effort normal and breath sounds normal. No respiratory distress. She has no wheezes. She has no rales. She exhibits no tenderness.  Abdominal: Soft. She exhibits no distension. There is no tenderness. There is no rebound.  Musculoskeletal: Normal range of motion. She exhibits no edema.  Right anterior knee tenderness to palpation. No obvious deformity. Full ROM.   Neurological: She is alert and oriented to person, place, and time. Coordination normal.  Speech is goal-oriented. Moves limbs without ataxia.   Skin: Skin is warm and dry.  Psychiatric: She has a normal mood and affect. Her behavior is normal.  Nursing note and vitals reviewed.   ED Course  Procedures (including critical care time)  DIAGNOSTIC STUDIES: Oxygen Saturation is 98% on RA, normal by my interpretation.    COORDINATION OF CARE: 9:29 PM- Advised pt to keep legs elevated and . Pt advised of plan for treatment and pt agrees.  Labs Review Labs Reviewed - No data to display  Imaging Review No results found. I have personally reviewed and evaluated these images and lab results as part of my medical decision-making.   EKG Interpretation None      MDM   Final diagnoses:  Right anterior knee pain  Pain in the shins, unspecified laterality    Patient likely having shin splints and right knee pain related to arthritis. Patient does not need xray at this time.  Patient will be discharged with Tramadol for pain. Patient's symptoms likely related to diabetes. Patient advised to follow up with PCP.   I personally performed the services described in this documentation, which was scribed in my presence. The recorded information has been reviewed and is accurate.     Alvina Chou, PA-C 11/11/14 2349  Wandra Arthurs, MD 11/11/14 [phone]

## 2014-11-30 ENCOUNTER — Encounter (HOSPITAL_COMMUNITY): Payer: Self-pay | Admitting: Emergency Medicine

## 2014-11-30 ENCOUNTER — Emergency Department (HOSPITAL_COMMUNITY): Payer: Self-pay

## 2014-11-30 ENCOUNTER — Emergency Department (HOSPITAL_COMMUNITY)
Admission: EM | Admit: 2014-11-30 | Discharge: 2014-11-30 | Disposition: A | Discharge status: Home / Self Care | Payer: Self-pay | Attending: Emergency Medicine | Admitting: Emergency Medicine

## 2014-11-30 DIAGNOSIS — R2243 Localized swelling, mass and lump, lower limb, bilateral: Secondary | ICD-10-CM | POA: Insufficient documentation

## 2014-11-30 DIAGNOSIS — Z79899 Other long term (current) drug therapy: Secondary | ICD-10-CM | POA: Insufficient documentation

## 2014-11-30 DIAGNOSIS — Z86010 Personal history of colonic polyps: Secondary | ICD-10-CM | POA: Insufficient documentation

## 2014-11-30 DIAGNOSIS — J029 Acute pharyngitis, unspecified: Secondary | ICD-10-CM | POA: Insufficient documentation

## 2014-11-30 DIAGNOSIS — Z7901 Long term (current) use of anticoagulants: Secondary | ICD-10-CM | POA: Insufficient documentation

## 2014-11-30 DIAGNOSIS — I10 Essential (primary) hypertension: Secondary | ICD-10-CM | POA: Insufficient documentation

## 2014-11-30 DIAGNOSIS — E119 Type 2 diabetes mellitus without complications: Secondary | ICD-10-CM | POA: Insufficient documentation

## 2014-11-30 DIAGNOSIS — R509 Fever, unspecified: Secondary | ICD-10-CM | POA: Insufficient documentation

## 2014-11-30 DIAGNOSIS — Z8673 Personal history of transient ischemic attack (TIA), and cerebral infarction without residual deficits: Secondary | ICD-10-CM | POA: Insufficient documentation

## 2014-11-30 DIAGNOSIS — R079 Chest pain, unspecified: Secondary | ICD-10-CM | POA: Insufficient documentation

## 2014-11-30 DIAGNOSIS — J40 Bronchitis, not specified as acute or chronic: Secondary | ICD-10-CM | POA: Insufficient documentation

## 2014-11-30 DIAGNOSIS — R0602 Shortness of breath: Secondary | ICD-10-CM | POA: Insufficient documentation

## 2014-11-30 DIAGNOSIS — Z7982 Long term (current) use of aspirin: Secondary | ICD-10-CM | POA: Insufficient documentation

## 2014-11-30 DIAGNOSIS — E079 Disorder of thyroid, unspecified: Secondary | ICD-10-CM | POA: Insufficient documentation

## 2014-11-30 MED ORDER — BENZONATATE 100 MG PO CAPS: 100.0000 mg | ORAL_CAPSULE | Freq: Three times a day (TID) | ORAL | Status: DC

## 2014-11-30 MED ORDER — IPRATROPIUM-ALBUTEROL 0.5-2.5 (3) MG/3ML IN SOLN
3.0000 mL | Freq: Once | RESPIRATORY_TRACT | Status: AC
  Administered 2014-11-30: 3 mL via RESPIRATORY_TRACT
  Filled 2014-11-30: qty 3

## 2014-11-30 MED ORDER — AZITHROMYCIN 250 MG PO TABS: 250.0000 mg | ORAL_TABLET | Freq: Every day | ORAL | Status: DC

## 2014-11-30 MED ORDER — ALBUTEROL SULFATE HFA 108 (90 BASE) MCG/ACT IN AERS: 1.0000 | INHALATION_SPRAY | Freq: Four times a day (QID) | RESPIRATORY_TRACT | Status: DC | PRN

## 2014-11-30 NOTE — ED Provider Notes (Signed)
CSN: [id number]     Arrival date & time 11/30/14  0998 History  By signing my name below, I, Tiffany Strickland, attest that this documentation has been prepared under the direction and in the presence of Clayton Bibles, PA-C Electronically Signed: Ladene Artist, ED Scribe 11/30/2014 at 11:41 AM.   Chief Complaint  Patient presents with  . URI   The history is provided by the patient. No language interpreter was used.   HPI Comments: Tiffany Strickland is a 59 y.o. female, with a h/o HTN and DM, who presents to the Emergency Department complaining of persistent dry cough for the past 5 days, productive since yesterday. She reports associated chills, fever of 101.8 F, rhinorrhea, congestion, sore throat, SOB, chest pain only with coughing, chest pressure since yesterday that usually occurs after coughing. ED temperature 98.2 F. She has tried Tylenol, honey and lemon with mild relief. Pt denies abdominal pain, vomiting, diarrhea, difficulty urinating, unchanged intermittent leg swelling for the past few years, unchanged bilateral leg pain, left worse than right, for the past few months. She denies known sick contacts. No h/o cardiac related illnesses. No recent travel. No estrogen use.   Past Medical History  Diagnosis Date  . Hypertension   . Stroke ([place])   . Thyroid disease   . Hyperlipidemia   . Diabetes mellitus without complication ([place])   . Colon polyp     adenomatous   Past Surgical History  Procedure Laterality Date  . Abdominal hysterectomy    . Tubal ligation    . Bladder suspension    . Knee surgery    . Eus N/A 08/12/2013    Procedure: LOWER ENDOSCOPIC ULTRASOUND (EUS);  Surgeon: Milus Banister, MD;  Location: [facility];  Service: Endoscopy;  Laterality: N/A;   Family History  Problem Relation Age of Onset  . Hypertension Mother   . Heart disease Maternal Grandmother   . Cancer Maternal Grandmother     ovarian  . Cancer Maternal Grandfather   . Hypertension Other   .  Hyperlipidemia Other   . Cancer Other   . Sleep apnea Other   . Obesity Other    Social History  Substance Use Topics  . Smoking status: Never Smoker   . Smokeless tobacco: Never Used  . Alcohol Use: No   OB History    Gravida Para Term Preterm AB TAB SAB Ectopic Multiple Living   6 5 5  1  1   5      Review of Systems  Constitutional: Positive for fever and chills.  HENT: Positive for congestion, rhinorrhea and sore throat.   Respiratory: Positive for cough and shortness of breath.   Cardiovascular: Positive for chest pain (only with coughing) and leg swelling (unchanged).  Gastrointestinal: Negative for nausea, vomiting, abdominal pain and diarrhea.  Genitourinary: Negative for difficulty urinating.  Musculoskeletal: Positive for myalgias.  All other systems reviewed and are negative.  Allergies  Pollen extract; Tomato; and Wool alcohol  Home Medications   Prior to Admission medications   Medication Sig Start Date End Date Taking? Authorizing Provider  aspirin EC 325 MG EC tablet Take 1 tablet (325 mg total) by mouth daily. 06/16/14   Tresa Garter, MD  clopidogrel (PLAVIX) 75 MG tablet Take 1 tablet (75 mg total) by mouth daily. 02/21/14   Tresa Garter, MD  guaiFENesin-dextromethorphan (ROBITUSSIN DM) 100-10 MG/5ML syrup Take 5 mLs by mouth every 8 (eight) hours as needed for cough. 06/16/14   Olugbemiga Essie Christine,  MD  levothyroxine (SYNTHROID, LEVOTHROID) 137 MCG tablet Take 1 tablet (137 mcg total) by mouth daily before breakfast. 04/15/14   Tresa Garter, MD  lisinopril-hydrochlorothiazide (PRINZIDE,ZESTORETIC) 20-25 MG per tablet Take 1 tablet by mouth every morning. 01/31/14   Tresa Garter, MD  loratadine (CLARITIN) 10 MG tablet Take 1 tablet (10 mg total) by mouth daily. 06/16/14   Tresa Garter, MD  polyethylene glycol (MIRALAX / GLYCOLAX) packet Take 17 g by mouth daily. 01/31/14   Tresa Garter, MD  traMADol (ULTRAM) 50 MG tablet Take 1  tablet (50 mg total) by mouth every 6 (six) hours as needed. 11/11/14   Alvina Chou, PA-C  Vitamin D, Ergocalciferol, (DRISDOL) 50000 UNITS CAPS capsule Take 1 capsule (50,000 Units total) by mouth every 7 (seven) days. 02/17/14   Tresa Garter, MD   BP 147/49 mmHg  Pulse 65  Temp(Src) 98.2 F (36.8 C) (Oral)  Resp 18  SpO2 95% Physical Exam  Constitutional: She appears well-developed and well-nourished. No distress.  HENT:  Head: Normocephalic and atraumatic.  Nose: Mucosal edema present. Right sinus exhibits no maxillary sinus tenderness and no frontal sinus tenderness. Left sinus exhibits no maxillary sinus tenderness and no frontal sinus tenderness.  Mouth/Throat: Uvula is midline and oropharynx is clear and moist. No oropharyngeal exudate, posterior oropharyngeal edema, posterior oropharyngeal erythema or tonsillar abscesses.  Nasal congestion.    Eyes: Conjunctivae are normal.  Neck: Normal range of motion. Neck supple.  Cardiovascular: Normal rate, regular rhythm and intact distal pulses.   Pulmonary/Chest: Effort normal and breath sounds normal. No respiratory distress. She has no wheezes. She has no rales. She exhibits tenderness.  Paroxysmal coughing  Abdominal: Soft. She exhibits no distension. There is no tenderness. There is no rebound and no guarding.  Musculoskeletal: She exhibits edema.  Symmetric bilateral LE edema, nonpitting.  Chronic dry skin changes.    Neurological: She is alert. She exhibits normal muscle tone.  Skin: She is not diaphoretic.  Nursing note and vitals reviewed.  ED Course  Procedures (including critical care time) DIAGNOSTIC STUDIES: Oxygen Saturation is 95% on RA, adequate by my interpretation.    COORDINATION OF CARE: 10:00 AM-Discussed treatment plan which includes EKG, CXR and nebulizer treatment with pt at bedside and pt agreed to plan.   Labs Review Labs Reviewed - No data to display  Imaging Review Dg Chest 2  View  11/30/2014  CLINICAL DATA:  Cough, shortness of breath, chest pain and fever for 5 days. EXAM: CHEST  2 VIEW COMPARISON:  Chest x-ray 10/20/2008 FINDINGS: The cardiac silhouette, mediastinal and hilar contours are within normal limits and stable. Moderate bronchitic type changes suggesting bronchitis or interstitial pneumonitis. No infiltrates or effusions. The bony thorax is intact. IMPRESSION: Findings suggest bronchitis or interstitial pneumonitis but no focal pneumonia. Electronically Signed   By: Marijo Sanes M.D.   On: 11/30/2014 11:28      EKG Interpretation None       ED ECG REPORT   Date: 11/30/2014  Rate: 63  Rhythm: normal sinus rhythm  QRS Axis: normal  Intervals: normal  ST/T Wave abnormalities: nonspecific ST changes  Conduction Disutrbances:none  Narrative Interpretation:   Old EKG Reviewed: none available  I have personally reviewed the EKG tracing and agree with the computerized printout as noted.   MDM   Final diagnoses:  Bronchitis    Afebrile, nontoxic patient with constellation of symptoms suggestive of viral syndrome.  No concerning findings on exam.  CXR  negative.  EKG nonischemic.  Chest pain related to coughing.   Discharged home with close PCP follow up.  Rx albuterol, tessalon, z-pak given DM.  Advised follow up with [facility] in 2-3 days if not improving and return to ED at any time for worsening symptoms.  Discussed result, findings, treatment, and follow up  with patient.  Pt given return precautions.  Pt verbalizes understanding and agrees with plan.       I personally performed the services described in this documentation, which was scribed in my presence. The recorded information has been reviewed and is accurate.    Clayton Bibles, PA-C 11/30/14 [place], MD 12/01/14 [phone]

## 2014-11-30 NOTE — Discharge Instructions (Signed)
Read the information below.  Use the prescribed medication as directed.  Please discuss all new medications with your pharmacist.  You may return to the Emergency Department at any time for worsening condition or any new symptoms that concern you.    If you develop high fevers that do not resolve with tylenol or ibuprofen, you develop uncontrolled chest pain, you have difficulty swallowing or breathing, or you are unable to tolerate fluids by mouth, return to the ER for a recheck.

## 2014-11-30 NOTE — ED Notes (Signed)
Patient complains of cough, runny nose, nasal congestion, yellow sputum when coughing.   Has been taking "honey and lemon at home, with tylenol for fever".  Patient states has not had to take tylenol since Monday.

## 2014-12-02 ENCOUNTER — Ambulatory Visit: Payer: Self-pay | Attending: Internal Medicine

## 2014-12-05 ENCOUNTER — Encounter (HOSPITAL_COMMUNITY): Payer: Self-pay | Admitting: *Deleted

## 2014-12-05 ENCOUNTER — Emergency Department (HOSPITAL_COMMUNITY)
Admission: EM | Admit: 2014-12-05 | Discharge: 2014-12-05 | Disposition: A | Discharge status: Home / Self Care | Payer: Self-pay | Attending: Emergency Medicine | Admitting: Emergency Medicine

## 2014-12-05 ENCOUNTER — Emergency Department (HOSPITAL_COMMUNITY): Payer: Self-pay

## 2014-12-05 DIAGNOSIS — Z79899 Other long term (current) drug therapy: Secondary | ICD-10-CM | POA: Insufficient documentation

## 2014-12-05 DIAGNOSIS — E079 Disorder of thyroid, unspecified: Secondary | ICD-10-CM | POA: Insufficient documentation

## 2014-12-05 DIAGNOSIS — E119 Type 2 diabetes mellitus without complications: Secondary | ICD-10-CM | POA: Insufficient documentation

## 2014-12-05 DIAGNOSIS — J40 Bronchitis, not specified as acute or chronic: Secondary | ICD-10-CM | POA: Insufficient documentation

## 2014-12-05 DIAGNOSIS — I10 Essential (primary) hypertension: Secondary | ICD-10-CM | POA: Insufficient documentation

## 2014-12-05 DIAGNOSIS — E785 Hyperlipidemia, unspecified: Secondary | ICD-10-CM | POA: Insufficient documentation

## 2014-12-05 DIAGNOSIS — Z8673 Personal history of transient ischemic attack (TIA), and cerebral infarction without residual deficits: Secondary | ICD-10-CM | POA: Insufficient documentation

## 2014-12-05 DIAGNOSIS — Z86010 Personal history of colonic polyps: Secondary | ICD-10-CM | POA: Insufficient documentation

## 2014-12-05 DIAGNOSIS — Z7982 Long term (current) use of aspirin: Secondary | ICD-10-CM | POA: Insufficient documentation

## 2014-12-05 LAB — BASIC METABOLIC PANEL
Anion gap: 9 (ref 5–15)
BUN: 6 mg/dL (ref 6–20)
CALCIUM: 9.6 mg/dL (ref 8.9–10.3)
CO2: 31 mmol/L (ref 22–32)
CREATININE: 0.81 mg/dL (ref 0.44–1.00)
Chloride: 100 mmol/L — ABNORMAL LOW (ref 101–111)
GLUCOSE: 103 mg/dL — AB (ref 65–99)
Potassium: 3.7 mmol/L (ref 3.5–5.1)
Sodium: 140 mmol/L (ref 135–145)

## 2014-12-05 LAB — CBC
HEMATOCRIT: 38.3 % (ref 36.0–46.0)
Hemoglobin: 12 g/dL (ref 12.0–15.0)
MCH: 29.4 pg (ref 26.0–34.0)
MCHC: 31.3 g/dL (ref 30.0–36.0)
MCV: 93.9 fL (ref 78.0–100.0)
PLATELETS: 296 10*3/uL (ref 150–400)
RBC: 4.08 MIL/uL (ref 3.87–5.11)
RDW: 14.5 % (ref 11.5–15.5)
WBC: 7.8 10*3/uL (ref 4.0–10.5)

## 2014-12-05 MED ORDER — PREDNISONE 50 MG PO TABS: ORAL_TABLET | ORAL | Status: DC

## 2014-12-05 MED ORDER — PREDNISONE 20 MG PO TABS
60.0000 mg | ORAL_TABLET | Freq: Once | ORAL | Status: AC
  Administered 2014-12-05: 60 mg via ORAL
  Filled 2014-12-05: qty 3

## 2014-12-05 MED ORDER — ALBUTEROL SULFATE (2.5 MG/3ML) 0.083% IN NEBU
INHALATION_SOLUTION | RESPIRATORY_TRACT | Status: AC
  Filled 2014-12-05: qty 6

## 2014-12-05 MED ORDER — HYDROCOD POLST-CPM POLST ER 10-8 MG/5ML PO SUER
5.0000 mL | Freq: Once | ORAL | Status: AC
  Administered 2014-12-05: 5 mL via ORAL
  Filled 2014-12-05: qty 5

## 2014-12-05 MED ORDER — ALBUTEROL SULFATE (2.5 MG/3ML) 0.083% IN NEBU
5.0000 mg | INHALATION_SOLUTION | Freq: Once | RESPIRATORY_TRACT | Status: AC
  Administered 2014-12-05: 5 mg via RESPIRATORY_TRACT

## 2014-12-05 MED ORDER — HYDROCODONE-HOMATROPINE 5-1.5 MG/5ML PO SYRP: 5.0000 mL | ORAL_SOLUTION | Freq: Four times a day (QID) | ORAL | Status: DC | PRN

## 2014-12-05 NOTE — ED Provider Notes (Signed)
CSN: [id number]     Arrival date & time 12/05/14  1346 History   First MD Initiated Contact with Patient 12/05/14 1807     Chief Complaint  Patient presents with  . Shortness of Breath     (Consider location/radiation/quality/duration/timing/severity/associated sxs/prior Treatment) HPI.....Marland Kitchen persistent cough for several days. Patient was evaluated in the emergency department on Thursday and diagnosed with bronchitis. She continues to cough.   She has been taking her antibiotics and using her inhaler. No fever, sweats, chills. Severity is mild to moderate. Nothing makes symptoms better or worse  Past Medical History  Diagnosis Date  . Hypertension   . Stroke ([place])   . Thyroid disease   . Hyperlipidemia   . Diabetes mellitus without complication ([place])   . Colon polyp     adenomatous   Past Surgical History  Procedure Laterality Date  . Abdominal hysterectomy    . Tubal ligation    . Bladder suspension    . Knee surgery    . Eus N/A 08/12/2013    Procedure: LOWER ENDOSCOPIC ULTRASOUND (EUS);  Surgeon: Milus Banister, MD;  Location: [facility];  Service: Endoscopy;  Laterality: N/A;   Family History  Problem Relation Age of Onset  . Hypertension Mother   . Heart disease Maternal Grandmother   . Cancer Maternal Grandmother     ovarian  . Cancer Maternal Grandfather   . Hypertension Other   . Hyperlipidemia Other   . Cancer Other   . Sleep apnea Other   . Obesity Other    Social History  Substance Use Topics  . Smoking status: Never Smoker   . Smokeless tobacco: Never Used  . Alcohol Use: No   OB History    Gravida Para Term Preterm AB TAB SAB Ectopic Multiple Living   6 5 5  1  1   5      Review of Systems  All other systems reviewed and are negative.     Allergies  Pollen extract; Tomato; and Wool alcohol  Home Medications   Prior to Admission medications   Medication Sig Start Date End Date Taking? Authorizing Provider  albuterol (PROVENTIL  HFA;VENTOLIN HFA) 108 (90 BASE) MCG/ACT inhaler Inhale 1-2 puffs into the lungs every 6 (six) hours as needed for wheezing or shortness of breath. 11/30/14  Yes Clayton Bibles, PA-C  aspirin EC 325 MG EC tablet Take 1 tablet (325 mg total) by mouth daily. 06/16/14  Yes Tresa Garter, MD  benzonatate (TESSALON) 100 MG capsule Take 1 capsule (100 mg total) by mouth every 8 (eight) hours. 11/30/14  Yes Clayton Bibles, PA-C  levothyroxine (SYNTHROID, LEVOTHROID) 137 MCG tablet Take 1 tablet (137 mcg total) by mouth daily before breakfast. 04/15/14  Yes Tresa Garter, MD  lisinopril-hydrochlorothiazide (PRINZIDE,ZESTORETIC) 20-25 MG per tablet Take 1 tablet by mouth every morning. 01/31/14  Yes Tresa Garter, MD  azithromycin (ZITHROMAX) 250 MG tablet Take 1 tablet (250 mg total) by mouth daily. Take first 2 tablets together, then 1 every day until finished. Patient not taking: Reported on 12/05/2014 11/30/14   Clayton Bibles, PA-C  clopidogrel (PLAVIX) 75 MG tablet Take 1 tablet (75 mg total) by mouth daily. Patient not taking: Reported on 12/05/2014 02/21/14   Tresa Garter, MD  guaiFENesin-dextromethorphan (ROBITUSSIN DM) 100-10 MG/5ML syrup Take 5 mLs by mouth every 8 (eight) hours as needed for cough. Patient not taking: Reported on 12/05/2014 06/16/14   Tresa Garter, MD  HYDROcodone-homatropine [facility]) 5-1.5 MG/5ML syrup  Take 5 mLs by mouth every 6 (six) hours as needed for cough. 12/05/14   Nat Christen, MD  loratadine (CLARITIN) 10 MG tablet Take 1 tablet (10 mg total) by mouth daily. Patient not taking: Reported on 12/05/2014 06/16/14   Tresa Garter, MD  polyethylene glycol (MIRALAX / GLYCOLAX) packet Take 17 g by mouth daily. Patient not taking: Reported on 12/05/2014 01/31/14   Tresa Garter, MD  predniSONE (DELTASONE) 50 MG tablet One tab daily for 7 days 12/05/14   Nat Christen, MD  traMADol (ULTRAM) 50 MG tablet Take 1 tablet (50 mg total) by mouth every 6 (six) hours  as needed. Patient not taking: Reported on 12/05/2014 11/11/14   Alvina Chou, PA-C  Vitamin D, Ergocalciferol, (DRISDOL) 50000 UNITS CAPS capsule Take 1 capsule (50,000 Units total) by mouth every 7 (seven) days. Patient not taking: Reported on 12/05/2014 02/17/14   Tresa Garter, MD   BP 127/48 mmHg  Pulse 60  Temp(Src) 97.8 F (36.6 C) (Oral)  Resp 21  SpO2 95% Physical Exam  Constitutional: She is oriented to person, place, and time.  Coughing, no acute distress  HENT:  Head: Normocephalic and atraumatic.  Eyes: Conjunctivae and EOM are normal. Pupils are equal, round, and reactive to light.  Neck: Normal range of motion. Neck supple.  Cardiovascular: Normal rate and regular rhythm.   Pulmonary/Chest: Effort normal and breath sounds normal.  Abdominal: Soft. Bowel sounds are normal.  Musculoskeletal: Normal range of motion.  Neurological: She is alert and oriented to person, place, and time.  Skin: Skin is warm and dry.  Psychiatric: She has a normal mood and affect. Her behavior is normal.  Nursing note and vitals reviewed.   ED Course  Procedures (including critical care time) Labs Review Labs Reviewed  BASIC METABOLIC PANEL - Abnormal; Notable for the following:    Chloride 100 (*)    Glucose, Bld 103 (*)    All other components within normal limits  CBC    Imaging Review Dg Chest 2 View  12/05/2014  CLINICAL DATA:  Shortness of breath for 4 days EXAM: CHEST  2 VIEW COMPARISON:  11/30/2014 FINDINGS: Cardiomediastinal silhouette is stable. No segmental infiltrate or pulmonary edema. Mild perihilar bronchitic changes. IMPRESSION: No acute infiltrate or pulmonary edema. Mild perihilar bronchitic changes. Electronically Signed   By: Lahoma Crocker M.D.   On: 12/05/2014 15:06   I have personally reviewed and evaluated these images and lab results as part of my medical decision-making.   EKG Interpretation   Date/Time:  Monday December 05 2014 14:22:34  EDT Ventricular Rate:  66 PR Interval:  180 QRS Duration: 76 QT Interval:  386 QTC Calculation: 404 R Axis:   61 Text Interpretation:  Normal sinus rhythm Normal ECG Confirmed by Roderick Sweezy   MD, Anelisse Jacobson ([id number]) on 12/05/2014 7:56:55 PM      MDM   Final diagnoses:  Bronchitis    Patient is hemodynamically stable. Chest x-ray shows no pneumonia. Continue antibiotic and inhaler. Will add prednisone and cough syrup.    Nat Christen, MD 12/05/14 2111

## 2014-12-05 NOTE — Discharge Instructions (Signed)
Chest x-ray showed no pneumonia. Continue your inhaler and antibiotics.  Prescription for prednisone and cough syrup. Increase fluids.

## 2014-12-05 NOTE — ED Notes (Signed)
Pt stable, ambulatory, states understanding of discharge instructions 

## 2014-12-05 NOTE — ED Notes (Signed)
PT was seen here on Thursday and told she had bronchitis.  Pt was given inhaler and pill for cough.  Pt was given abx and she completed.  Pt states cough is still there and sounds congested and ears fill funny. Pt reports sob.

## 2014-12-09 ENCOUNTER — Ambulatory Visit: Payer: Self-pay | Attending: Internal Medicine

## 2014-12-15 ENCOUNTER — Encounter: Payer: Self-pay | Admitting: Internal Medicine

## 2014-12-15 ENCOUNTER — Ambulatory Visit: Payer: Self-pay | Attending: Internal Medicine | Admitting: Internal Medicine

## 2014-12-15 VITALS — BP 127/85 | HR 72 | Temp 98.3°F | Resp 18 | Ht 65.0 in | Wt 271.0 lb

## 2014-12-15 DIAGNOSIS — R600 Localized edema: Secondary | ICD-10-CM

## 2014-12-15 DIAGNOSIS — E119 Type 2 diabetes mellitus without complications: Secondary | ICD-10-CM

## 2014-12-15 DIAGNOSIS — I10 Essential (primary) hypertension: Secondary | ICD-10-CM

## 2014-12-15 DIAGNOSIS — E038 Other specified hypothyroidism: Secondary | ICD-10-CM

## 2014-12-15 LAB — GLUCOSE, POCT (MANUAL RESULT ENTRY): POC GLUCOSE: 109 mg/dL — AB (ref 70–99)

## 2014-12-15 LAB — POCT GLYCOSYLATED HEMOGLOBIN (HGB A1C): Hemoglobin A1C: 7.2

## 2014-12-15 MED ORDER — METFORMIN HCL 500 MG PO TABS: 500.0000 mg | ORAL_TABLET | Freq: Two times a day (BID) | ORAL | Status: DC

## 2014-12-15 NOTE — Progress Notes (Signed)
Tiffany Strickland, is a 59 y.o. female  KDT:[id number]  DXI:[id number]  DOB - October 13, 1955  Chief Complaint  Patient presents with  . Follow-up        Subjective:   Tiffany Strickland is a 59 y.o. female with a history of hypertension, diabetes mellitus, hyperlipidemia, hypothyroidism and stroke in 2004 here today for a follow up ED visits. Patient has been to the ED 2 times in the last 2 weeks with complaints of persistent cough productive of yellowish sputum, associated with chills and fever of 101.72F, rhinorrhea, sore throat and congestion with occasional chest pain while coughing. Patient was evaluated and diagnosed with bronchitis, treated with albuterol, Tessalon, Z-Pak and Robitussin. Patient did not feel better, went back to the ED, chest x-ray showed bronchitis versus interstitial pneumonitis but no focal pneumonia. She was continued on antibiotics and inhaler, prednisone was added and some other cough syrup. Patient is here today for follow-up. She claims that she feels slightly better but started to notice bilateral lower limb swelling. No history of trauma. No redness. No more fever. Patient also complains of pain in left leg more than right. Pain been present for awhile, pain described as aching and sharp, scaled at a 4-5/10. Patient has No headache, No chest pain, No abdominal pain - No Nausea, No new weakness tingling or numbness. No PND.   Problem  Bilateral Leg Edema    ALLERGIES: Allergies  Allergen Reactions  . Pollen Extract Other (See Comments)    Reaction unknown  . Tomato Other (See Comments)    Reaction unknown  . Wool Alcohol [Lanolin] Other (See Comments)    Reaction unknown    PAST MEDICAL HISTORY: Past Medical History  Diagnosis Date  . Hypertension   . Stroke ([place])   . Thyroid disease   . Hyperlipidemia   . Diabetes mellitus without complication ([place])   . Colon polyp     adenomatous    MEDICATIONS AT HOME: Prior to Admission medications     Medication Sig Start Date End Date Taking? Authorizing Provider  albuterol (PROVENTIL HFA;VENTOLIN HFA) 108 (90 BASE) MCG/ACT inhaler Inhale 1-2 puffs into the lungs every 6 (six) hours as needed for wheezing or shortness of breath. 11/30/14  Yes Clayton Bibles, PA-C  aspirin EC 325 MG EC tablet Take 1 tablet (325 mg total) by mouth daily. 06/16/14  Yes Tresa Garter, MD  benzonatate (TESSALON) 100 MG capsule Take 1 capsule (100 mg total) by mouth every 8 (eight) hours. 11/30/14  Yes Emily West, PA-C  guaiFENesin-dextromethorphan (ROBITUSSIN DM) 100-10 MG/5ML syrup Take 5 mLs by mouth every 8 (eight) hours as needed for cough. 06/16/14  Yes Tresa Garter, MD  levothyroxine (SYNTHROID, LEVOTHROID) 137 MCG tablet Take 1 tablet (137 mcg total) by mouth daily before breakfast. 04/15/14  Yes Tresa Garter, MD  lisinopril-hydrochlorothiazide (PRINZIDE,ZESTORETIC) 20-25 MG per tablet Take 1 tablet by mouth every morning. 01/31/14  Yes Tresa Garter, MD  azithromycin (ZITHROMAX) 250 MG tablet Take 1 tablet (250 mg total) by mouth daily. Take first 2 tablets together, then 1 every day until finished. Patient not taking: Reported on 12/05/2014 11/30/14   Clayton Bibles, PA-C  HYDROcodone-homatropine [facility] 5-1.5 MG/5ML syrup Take 5 mLs by mouth every 6 (six) hours as needed for cough. Patient not taking: Reported on 12/15/2014 12/05/14   Nat Christen, MD  loratadine (CLARITIN) 10 MG tablet Take 1 tablet (10 mg total) by mouth daily. Patient not taking: Reported on 12/05/2014 06/16/14   Macaria Bias  Essie Christine, MD  polyethylene glycol (MIRALAX / GLYCOLAX) packet Take 17 g by mouth daily. Patient not taking: Reported on 12/05/2014 01/31/14   Tresa Garter, MD  predniSONE (DELTASONE) 50 MG tablet One tab daily for 7 days Patient not taking: Reported on 12/15/2014 12/05/14   Nat Christen, MD  traMADol (ULTRAM) 50 MG tablet Take 1 tablet (50 mg total) by mouth every 6 (six) hours as needed. Patient not  taking: Reported on 12/05/2014 11/11/14   Alvina Chou, PA-C  Vitamin D, Ergocalciferol, (DRISDOL) 50000 UNITS CAPS capsule Take 1 capsule (50,000 Units total) by mouth every 7 (seven) days. Patient not taking: Reported on 12/05/2014 02/17/14   Tresa Garter, MD     Objective:   Filed Vitals:   12/15/14 1744  BP: 127/85  Pulse: 72  Temp: 98.3 F (36.8 C)  TempSrc: Oral  Resp: 18  Height: 5\' 5"  (1.651 m)  Weight: 271 lb (122.925 kg)  SpO2: 94%    Exam General appearance : Awake, alert, not in any distress. Speech Clear. Not toxic looking HEENT: Atraumatic and Normocephalic, pupils equally reactive to light and accomodation Neck: supple, no JVD. No cervical lymphadenopathy.  Chest:Good air entry bilaterally, no added sounds  CVS: S1 S2 regular, no murmurs.  Abdomen: Bowel sounds present, Non tender and not distended with no gaurding, rigidity or rebound. Extremities: B/L Lower Ext shows no edema, both legs are warm to touch Neurology: Awake alert, and oriented X 3, CN II-XII intact, Non focal Skin: No Rash  Data Review Lab Results  Component Value Date   HGBA1C 7.20 12/15/2014   HGBA1C 6.70 06/16/2014   HGBA1C 6.4 01/31/2014     Assessment & Plan   1. New Onset Type 2 Diabetes Mellitus  - POCT A1C - Glucose (CBG) - Microalbumin/Creatinine Ratio, Urine -  Aim for 30 minutes of exercise most days. Rethink what you drink. Water is great! Aim for 2-3 Carb Choices per meal (30-45 grams) +/- 1 either way  Aim for 0-15 Carbs per snack if hungry  Include protein in moderation with your meals and snacks  Consider reading food labels for Total Carbohydrate and Fat Grams of foods  Consider checking BG at alternate times per day  Continue taking medication as directed Be mindful about how much sugar you are adding to beverages and other foods. Fruit Punch - find one with no sugar  Measure and decrease portions of carbohydrate foods  Make your plate and don't go  back for seconds  2. Essential hypertension  - Echocardiogram; Future  We have discussed target BP range and blood pressure goal. I have advised patient to check BP regularly and to call us back or report to clinic if the numbers are consistently higher than 140/90. We discussed the importance of compliance with medical therapy and DASH diet recommended, consequences of uncontrolled hypertension discussed.   - continue current BP medications  3. Other specified hypothyroidism  - TSH  4. Bilateral leg edema  - Ultrasound doppler venous legs bilat; Future - Vit D  25 hydroxy (rtn osteoporosis monitoring)  Patient have been counseled extensively about nutrition and exercise  Return in about 3 months (around 03/17/2015) for Hemoglobin A1C and Follow up, DM, Follow up HTN.  The patient was given clear instructions to go to ER or return to medical center if symptoms don't improve, worsen or new problems develop. The patient verbalized understanding. The patient was told to call to get lab results if they haven't heard  anything in the next week.   This note has been created with Surveyor, quantity. Any transcriptional errors are unintentional.    Angelica Chessman, MD, [place], [place], [place], [place] and [place], [place]   12/15/2014, 6:09 PM

## 2014-12-15 NOTE — Progress Notes (Signed)
Patient here for Follow-up bronchitis. Patient complains of pain in left leg more than right. Pain been present for awhile, pain described as aching and sharp. Pain scaled at a 4/5.

## 2014-12-16 ENCOUNTER — Telehealth: Payer: Self-pay | Admitting: *Deleted

## 2014-12-16 ENCOUNTER — Other Ambulatory Visit: Payer: Self-pay | Admitting: Internal Medicine

## 2014-12-16 LAB — MICROALBUMIN / CREATININE URINE RATIO: CREATININE, URINE: 125 mg/dL (ref 20–320)

## 2014-12-16 LAB — VITAMIN D 25 HYDROXY (VIT D DEFICIENCY, FRACTURES): Vit D, 25-Hydroxy: 21 ng/mL — ABNORMAL LOW (ref 30–100)

## 2014-12-16 LAB — TSH: TSH: 5.016 u[IU]/mL — ABNORMAL HIGH (ref 0.350–4.500)

## 2014-12-16 MED ORDER — VITAMIN D (ERGOCALCIFEROL) 1.25 MG (50000 UNIT) PO CAPS: 50000.0000 [IU] | ORAL_CAPSULE | ORAL | Status: DC

## 2014-12-16 NOTE — Telephone Encounter (Signed)
-----   Message from Tresa Garter, MD sent at 12/16/2014  2:16 PM EDT ----- Please inform patient that her vitamin D level is still low but better than previous result, will prescribe vitamin D capsule, take 1 capsule weekly for 12 weeks, eprescribed to the pharmacy. Thyroid function is slightly low, continue the thyroid medication as prescribed yesterday. We will repeat vitamin D level and thyroid function in 6 months. Continue all other medications as prescribed for now.

## 2014-12-16 NOTE — Telephone Encounter (Signed)
Patient verified DOB  Patient was informed of her Vitamin D level still being low but better than previous result. Patient informed of Vitamin capsules being ordered and advised to take 1 capsule weekly for 12 weeks. Patient advised of thyroid function being slightly low and to continue her current medication. Patient informed of thyroid and vitamin D being rechecked in 6 months.   Patient expressed her understanding and had no further questions.

## 2014-12-21 ENCOUNTER — Other Ambulatory Visit: Payer: Self-pay | Admitting: Internal Medicine

## 2014-12-21 ENCOUNTER — Telehealth: Payer: Self-pay | Admitting: *Deleted

## 2014-12-21 ENCOUNTER — Ambulatory Visit: Payer: Self-pay | Attending: Family Medicine | Admitting: Family Medicine

## 2014-12-21 ENCOUNTER — Encounter: Payer: Self-pay | Admitting: Family Medicine

## 2014-12-21 VITALS — BP 134/98 | HR 63 | Temp 97.6°F | Resp 18 | Ht 65.0 in | Wt 272.0 lb

## 2014-12-21 DIAGNOSIS — E1169 Type 2 diabetes mellitus with other specified complication: Secondary | ICD-10-CM

## 2014-12-21 DIAGNOSIS — Z794 Long term (current) use of insulin: Secondary | ICD-10-CM

## 2014-12-21 DIAGNOSIS — E119 Type 2 diabetes mellitus without complications: Secondary | ICD-10-CM | POA: Insufficient documentation

## 2014-12-21 DIAGNOSIS — R609 Edema, unspecified: Secondary | ICD-10-CM

## 2014-12-21 DIAGNOSIS — L98429 Non-pressure chronic ulcer of back with unspecified severity: Secondary | ICD-10-CM

## 2014-12-21 DIAGNOSIS — M546 Pain in thoracic spine: Secondary | ICD-10-CM | POA: Insufficient documentation

## 2014-12-21 LAB — GLUCOSE, POCT (MANUAL RESULT ENTRY): POC Glucose: 76 mg/dl (ref 70–99)

## 2014-12-21 MED ORDER — TRIPLE ANTIBIOTIC 5-400-5000 EX OINT: TOPICAL_OINTMENT | Freq: Four times a day (QID) | CUTANEOUS | Status: DC

## 2014-12-21 NOTE — Telephone Encounter (Signed)
Patient verified DOB Medical Assistant informed patient of her Echocardiogram and Venous doppler procedures being scheduled on November 11 at 10:00am and 11:00am. Patient will register at the [facility] 15 minutes prior to appointment time. Patient was given the scheduling number if she needed to change the time or date of her appointment. Patient wrote everything down and had no further questions.

## 2014-12-21 NOTE — Progress Notes (Signed)
Pt's here for open wound on back at the bra line.   Pt reports pain at level 8/10. Described as an aching pain.  Pt reports taken meds today including BP meds.

## 2014-12-21 NOTE — Progress Notes (Signed)
Subjective:    [name], female    DOB: 11/28/1955, 59 y.o.   MRN: [id number]  HPI 59 year old female with a history of type 2 diabetes mellitus (A1c 7.2), hypertension, hypothyroidism who comes in here for an acute visit complaining of a thoracic back blister which popped today forming a sore and associated pain at the site. She denies any discharge from site or fever and has not applied any OTC creams to it.  Past Medical History  Diagnosis Date  . Hypertension   . Stroke ([place])   . Thyroid disease   . Hyperlipidemia   . Diabetes mellitus without complication ([place])   . Colon polyp     adenomatous    Past Surgical History  Procedure Laterality Date  . Abdominal hysterectomy    . Tubal ligation    . Bladder suspension    . Knee surgery    . Eus N/A 08/12/2013    Procedure: LOWER ENDOSCOPIC ULTRASOUND (EUS);  Surgeon: Milus Banister, MD;  Location: [facility];  Service: Endoscopy;  Laterality: N/A;    Social History   Social History  . Marital Status: Single    Spouse Name: N/A  . Number of Children: N/A  . Years of Education: N/A   Occupational History  . Not on file.   Social History Main Topics  . Smoking status: Never Smoker   . Smokeless tobacco: Never Used  . Alcohol Use: No  . Drug Use: No  . Sexual Activity: Yes    Birth Control/ Protection: Surgical   Other Topics Concern  . Not on file   Social History Narrative    Allergies  Allergen Reactions  . Pollen Extract Other (See Comments)    Reaction unknown  . Tomato Other (See Comments)    Reaction unknown  . Wool Alcohol [Lanolin] Other (See Comments)    Reaction unknown   Current Outpatient Prescriptions on File Prior to Visit  Medication Sig Dispense Refill  . albuterol (PROVENTIL HFA;VENTOLIN HFA) 108 (90 BASE) MCG/ACT inhaler Inhale 1-2 puffs into the lungs every 6 (six) hours as needed for wheezing or shortness of breath. 1 Inhaler 0  . aspirin EC 325 MG EC tablet Take 1  tablet (325 mg total) by mouth daily. 90 tablet 3  . HYDROcodone-homatropine (HYCODAN) 5-1.5 MG/5ML syrup Take 5 mLs by mouth every 6 (six) hours as needed for cough. 120 mL 0  . levothyroxine (SYNTHROID, LEVOTHROID) 137 MCG tablet Take 1 tablet (137 mcg total) by mouth daily before breakfast. 90 tablet 3  . lisinopril-hydrochlorothiazide (PRINZIDE,ZESTORETIC) 20-25 MG per tablet Take 1 tablet by mouth every morning. 90 tablet 3  . metFORMIN (GLUCOPHAGE) 500 MG tablet Take 1 tablet (500 mg total) by mouth 2 (two) times daily with a meal. 180 tablet 3  . polyethylene glycol (MIRALAX / GLYCOLAX) packet Take 17 g by mouth daily. 14 each 2  . Vitamin D, Ergocalciferol, (DRISDOL) 50000 UNITS CAPS capsule Take 1 capsule (50,000 Units total) by mouth every 7 (seven) days. 12 capsule 0  . azithromycin (ZITHROMAX) 250 MG tablet Take 1 tablet (250 mg total) by mouth daily. Take first 2 tablets together, then 1 every day until finished. (Patient not taking: Reported on 12/21/2014) 6 tablet 0  . benzonatate (TESSALON) 100 MG capsule Take 1 capsule (100 mg total) by mouth every 8 (eight) hours. (Patient not taking: Reported on 12/21/2014) 21 capsule 0  . guaiFENesin-dextromethorphan (ROBITUSSIN DM) 100-10 MG/5ML syrup Take 5 mLs by mouth  every 8 (eight) hours as needed for cough. (Patient not taking: Reported on 12/21/2014) 480 mL 0  . loratadine (CLARITIN) 10 MG tablet Take 1 tablet (10 mg total) by mouth daily. (Patient not taking: Reported on 12/21/2014) 30 tablet 11  . predniSONE (DELTASONE) 50 MG tablet One tab daily for 7 days (Patient not taking: Reported on 12/21/2014) 7 tablet 0  . traMADol (ULTRAM) 50 MG tablet Take 1 tablet (50 mg total) by mouth every 6 (six) hours as needed. (Patient not taking: Reported on 12/21/2014) 15 tablet 0  . [DISCONTINUED] amLODipine (NORVASC) 10 MG tablet Take 10 mg by mouth daily.    . [DISCONTINUED] hydrochlorothiazide (HYDRODIURIL) 25 MG tablet Take 25 mg by mouth daily.      No current facility-administered medications on file prior to visit.       Review of Systems  Constitutional: Negative for activity change and appetite change.  HENT: Negative for sinus pressure and sore throat.   Respiratory: Negative for chest tightness, shortness of breath and wheezing.   Gastrointestinal: Negative for abdominal pain, constipation and abdominal distention.  Genitourinary: Negative.   Musculoskeletal: Negative.   Skin:       See hpi  Psychiatric/Behavioral: Negative for behavioral problems and dysphoric mood.       Objective: Filed Vitals:   12/21/14 1405  BP: 134/98  Pulse: 63  Temp: 97.6 F (36.4 C)  TempSrc: Oral  Resp: 18  Height: 5\' 5"  (1.651 m)  Weight: 272 lb (123.378 kg)  SpO2: 98%      Physical Exam  Constitutional: She is oriented to person, place, and time. She appears well-developed and well-nourished.  Cardiovascular: Normal rate, normal heart sounds and intact distal pulses.   No murmur heard. Pulmonary/Chest: Effort normal and breath sounds normal. She has no wheezes. She has no rales. She exhibits no tenderness.  Abdominal: Soft. Bowel sounds are normal. She exhibits no distension and no mass. There is no tenderness.  Musculoskeletal: Normal range of motion.  Neurological: She is alert and oriented to person, place, and time.  Skin:  Mid back 0.5x 0.5cm ulcer with no discharge, roof of blister attached to one edge of ulcer.          Assessment & Plan:  Thoracic back: Band-Aid applied in clinic.  Prescribed antibiotic ointment to be applied topically.  Diabetes mellitus: Controlled with A1c of 7.2. Continue metformin  This note has been created with Surveyor, quantity. Any transcriptional errors are unintentional.    Patient advised to avoid using clothes that will worsen friction at that site for the next 24 hours.

## 2014-12-23 ENCOUNTER — Ambulatory Visit (HOSPITAL_BASED_OUTPATIENT_CLINIC_OR_DEPARTMENT_OTHER)
Admission: RE | Admit: 2014-12-23 | Discharge: 2014-12-23 | Disposition: A | Discharge status: Home / Self Care | Payer: Self-pay | Source: Ambulatory Visit | Attending: Internal Medicine | Admitting: Internal Medicine

## 2014-12-23 ENCOUNTER — Other Ambulatory Visit (HOSPITAL_COMMUNITY): Payer: Self-pay

## 2014-12-23 ENCOUNTER — Ambulatory Visit (HOSPITAL_COMMUNITY)
Admission: RE | Admit: 2014-12-23 | Discharge: 2014-12-23 | Disposition: A | Discharge status: Home / Self Care | Payer: Self-pay | Source: Ambulatory Visit | Attending: Internal Medicine | Admitting: Internal Medicine

## 2014-12-23 ENCOUNTER — Telehealth: Payer: Self-pay

## 2014-12-23 ENCOUNTER — Encounter (HOSPITAL_COMMUNITY): Payer: Self-pay

## 2014-12-23 DIAGNOSIS — R609 Edema, unspecified: Secondary | ICD-10-CM

## 2014-12-23 DIAGNOSIS — I10 Essential (primary) hypertension: Secondary | ICD-10-CM

## 2014-12-23 DIAGNOSIS — I371 Nonrheumatic pulmonary valve insufficiency: Secondary | ICD-10-CM | POA: Insufficient documentation

## 2014-12-23 DIAGNOSIS — R600 Localized edema: Secondary | ICD-10-CM | POA: Insufficient documentation

## 2014-12-23 DIAGNOSIS — E119 Type 2 diabetes mellitus without complications: Secondary | ICD-10-CM | POA: Insufficient documentation

## 2014-12-23 DIAGNOSIS — I509 Heart failure, unspecified: Secondary | ICD-10-CM | POA: Insufficient documentation

## 2014-12-23 DIAGNOSIS — M79605 Pain in left leg: Secondary | ICD-10-CM | POA: Insufficient documentation

## 2014-12-23 DIAGNOSIS — E785 Hyperlipidemia, unspecified: Secondary | ICD-10-CM | POA: Insufficient documentation

## 2014-12-23 DIAGNOSIS — M79604 Pain in right leg: Secondary | ICD-10-CM | POA: Insufficient documentation

## 2014-12-23 NOTE — Progress Notes (Signed)
  Echocardiogram 2D Echocardiogram has been performed.  Tiffany Strickland 12/23/2014, 2:38 PM

## 2014-12-23 NOTE — Progress Notes (Signed)
VASCULAR LAB PRELIMINARY  PRELIMINARY  PRELIMINARY  PRELIMINARY  Bilateral lower extremity venous duplex completed.    Preliminary report:  Bilateral:  No evidence of DVT, superficial thrombosis, or Baker's Cyst.   Abygail Galeno, RVS 12/23/2014, 5:11 PM

## 2014-12-23 NOTE — Telephone Encounter (Signed)
[place] from vascular lab called Patient is negative for DVT

## 2015-01-02 NOTE — Telephone Encounter (Signed)
Medical Assistant left message with person. Message states to give a call back to [place] with [facility] at [phone].

## 2015-01-02 NOTE — Telephone Encounter (Signed)
-----   Message from Tresa Garter, MD sent at 12/28/2014  9:03 AM EST ----- Please inform patient that her lower extremity ultrasound showed no evidence of blood clot.

## 2015-01-13 NOTE — Telephone Encounter (Signed)
Patient returned phone call, please f/u °

## 2015-01-18 NOTE — Telephone Encounter (Signed)
Patient returned phone call, please f/u °

## 2015-01-18 NOTE — Telephone Encounter (Signed)
-----   Message from Tresa Garter, MD sent at 12/28/2014  9:04 AM EST ----- Please inform patient that her heart ultrasound is normal.

## 2015-01-18 NOTE — Telephone Encounter (Signed)
Medical Assistant left message with person on first contact. Verbal message states to give a call back to [place] with [facility] at [phone].

## 2015-01-20 ENCOUNTER — Telehealth: Payer: Self-pay | Admitting: *Deleted

## 2015-01-20 NOTE — Telephone Encounter (Signed)
Patient verified DOB Patient informed of Ultrasound showing no signs of a blood clot. Patient expressed her understanding and had no further questions.

## 2015-02-22 ENCOUNTER — Ambulatory Visit: Payer: Self-pay

## 2015-03-02 ENCOUNTER — Ambulatory Visit: Payer: Self-pay

## 2015-03-03 ENCOUNTER — Ambulatory Visit: Payer: Self-pay

## 2015-03-06 ENCOUNTER — Other Ambulatory Visit: Payer: Self-pay | Admitting: Internal Medicine

## 2015-03-06 ENCOUNTER — Ambulatory Visit
Admission: RE | Admit: 2015-03-06 | Discharge: 2015-03-06 | Disposition: A | Discharge status: Home / Self Care | Payer: No Typology Code available for payment source | Source: Ambulatory Visit | Attending: Internal Medicine | Admitting: Internal Medicine

## 2015-03-06 DIAGNOSIS — N6320 Unspecified lump in the left breast, unspecified quadrant: Secondary | ICD-10-CM

## 2015-03-06 DIAGNOSIS — Z1239 Encounter for other screening for malignant neoplasm of breast: Secondary | ICD-10-CM

## 2015-03-07 ENCOUNTER — Telehealth: Payer: Self-pay | Admitting: *Deleted

## 2015-03-07 NOTE — Telephone Encounter (Signed)
-----   Message from Tresa Garter, MD sent at 03/06/2015  7:18 PM EST ----- Please inform patient that her screening mammogram needs further evaluation for 2 masses in the left breast. Order has been placed please schedule.

## 2015-03-07 NOTE — Telephone Encounter (Signed)
Patient verified DOB Patient informed of screening showing two masses located in the left breast. Patient informed of appointment being scheduled for Thursday 03/09/15 at 9:20 with the [facility]. Patient was provided the location and contact number to reschedule appointment if she needed. Patient expressed her understanding and wrote all information down.

## 2015-03-08 ENCOUNTER — Other Ambulatory Visit: Payer: Self-pay | Admitting: Internal Medicine

## 2015-03-08 DIAGNOSIS — N6320 Unspecified lump in the left breast, unspecified quadrant: Secondary | ICD-10-CM

## 2015-03-09 ENCOUNTER — Other Ambulatory Visit: Payer: No Typology Code available for payment source

## 2015-03-10 ENCOUNTER — Ambulatory Visit
Admission: RE | Admit: 2015-03-10 | Discharge: 2015-03-10 | Disposition: A | Discharge status: Home / Self Care | Payer: No Typology Code available for payment source | Source: Ambulatory Visit | Attending: Internal Medicine | Admitting: Internal Medicine

## 2015-03-10 ENCOUNTER — Other Ambulatory Visit: Payer: Self-pay | Admitting: Internal Medicine

## 2015-03-10 DIAGNOSIS — N6320 Unspecified lump in the left breast, unspecified quadrant: Secondary | ICD-10-CM

## 2015-03-20 ENCOUNTER — Other Ambulatory Visit: Payer: Self-pay | Admitting: Internal Medicine

## 2015-03-20 MED FILL — LISINOPRIL-HCTZ 20-25 MG TA: 20-25 | 90 days supply | Qty: 90 | Fill #0

## 2015-03-21 ENCOUNTER — Telehealth: Payer: Self-pay | Admitting: *Deleted

## 2015-03-21 NOTE — Telephone Encounter (Signed)
Patient called returning nurse's call to review results patient states phone will be off 10-12, please f/u

## 2015-03-21 NOTE — Telephone Encounter (Signed)
-----   Message from Tresa Garter, MD sent at 03/14/2015  4:32 PM EST ----- Please inform patient that her mammogram shows no evidence of malignancy. 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 Recommend screening mammogram in one year.

## 2015-03-21 NOTE — Telephone Encounter (Signed)
Medical Assistant left message on patient's home and cell voicemail. Voicemail states to give a call back to Jakai Onofre with [facility] at [phone].  

## 2015-03-22 ENCOUNTER — Telehealth: Payer: Self-pay | Admitting: Internal Medicine

## 2015-03-22 NOTE — Telephone Encounter (Signed)
Patient needs prescription refill lisonpril, miralax....please follow up

## 2015-03-22 NOTE — Telephone Encounter (Signed)
Patient verified DOB Patient informed of mammogram showing no evidence of malignancy. Patient informed of tumor in the left breast has been stable for the past 4 years and being compatible with other benign findings. Patient advised to have a repeat mammogram in a year. Patient expressed her understanding and only concern was not being notified of any findings prior to this year.  Patient expressed whether findings are acute or previous findings she would like to be notified. Patient had no further questions at this time.

## 2015-03-27 ENCOUNTER — Other Ambulatory Visit: Payer: Self-pay | Admitting: *Deleted

## 2015-03-27 NOTE — Telephone Encounter (Signed)
Medical Assistant left message on patient's home and cell voicemail. Voicemail states to give a call back to [place] with [facility] at [phone].  !!!Patients Miralax was refilled with 2 additional refills. Patients Lisinopril HCTZ was refilled on 03/20/15!!!

## 2015-04-06 ENCOUNTER — Ambulatory Visit: Payer: No Typology Code available for payment source | Admitting: Internal Medicine

## 2015-05-12 LAB — GLUCOSE, POCT (MANUAL RESULT ENTRY): POC Glucose: 167 mg/dl — AB (ref 70–99)

## 2015-06-08 ENCOUNTER — Encounter: Payer: Self-pay | Admitting: Internal Medicine

## 2015-06-08 ENCOUNTER — Ambulatory Visit: Payer: No Typology Code available for payment source | Attending: Internal Medicine | Admitting: Internal Medicine

## 2015-06-08 VITALS — BP 125/75 | HR 66 | Temp 97.4°F | Resp 18 | Ht 65.0 in | Wt 265.6 lb

## 2015-06-08 DIAGNOSIS — E038 Other specified hypothyroidism: Secondary | ICD-10-CM

## 2015-06-08 DIAGNOSIS — Z1211 Encounter for screening for malignant neoplasm of colon: Secondary | ICD-10-CM

## 2015-06-08 DIAGNOSIS — Z79899 Other long term (current) drug therapy: Secondary | ICD-10-CM | POA: Insufficient documentation

## 2015-06-08 DIAGNOSIS — I10 Essential (primary) hypertension: Secondary | ICD-10-CM | POA: Insufficient documentation

## 2015-06-08 DIAGNOSIS — E119 Type 2 diabetes mellitus without complications: Secondary | ICD-10-CM | POA: Insufficient documentation

## 2015-06-08 DIAGNOSIS — Z888 Allergy status to other drugs, medicaments and biological substances status: Secondary | ICD-10-CM | POA: Insufficient documentation

## 2015-06-08 DIAGNOSIS — J302 Other seasonal allergic rhinitis: Secondary | ICD-10-CM | POA: Insufficient documentation

## 2015-06-08 DIAGNOSIS — E039 Hypothyroidism, unspecified: Secondary | ICD-10-CM | POA: Insufficient documentation

## 2015-06-08 DIAGNOSIS — E785 Hyperlipidemia, unspecified: Secondary | ICD-10-CM | POA: Insufficient documentation

## 2015-06-08 DIAGNOSIS — Z7982 Long term (current) use of aspirin: Secondary | ICD-10-CM | POA: Insufficient documentation

## 2015-06-08 DIAGNOSIS — Z8673 Personal history of transient ischemic attack (TIA), and cerebral infarction without residual deficits: Secondary | ICD-10-CM | POA: Insufficient documentation

## 2015-06-08 DIAGNOSIS — E079 Disorder of thyroid, unspecified: Secondary | ICD-10-CM | POA: Insufficient documentation

## 2015-06-08 LAB — GLUCOSE, POCT (MANUAL RESULT ENTRY): POC Glucose: 78 mg/dl (ref 70–99)

## 2015-06-08 LAB — POCT GLYCOSYLATED HEMOGLOBIN (HGB A1C): Hemoglobin A1C: 6.7

## 2015-06-08 MED ORDER — POLYETHYLENE GLYCOL 3350 17 G PO PACK: 17.0000 g | PACK | Freq: Every day | ORAL | Status: DC

## 2015-06-08 MED ORDER — LISINOPRIL-HYDROCHLOROTHIAZIDE 20-25 MG PO TABS: 1.0000 | ORAL_TABLET | Freq: Every morning | ORAL | Status: DC

## 2015-06-08 MED ORDER — LEVOTHYROXINE SODIUM 137 MCG PO TABS: 137.0000 ug | ORAL_TABLET | Freq: Every day | ORAL | Status: DC

## 2015-06-08 MED ORDER — LORATADINE 10 MG PO TABS: 10.0000 mg | ORAL_TABLET | Freq: Every day | ORAL | Status: DC

## 2015-06-08 NOTE — Patient Instructions (Signed)
DASH Eating Plan DASH stands for "Dietary Approaches to Stop Hypertension." The DASH eating plan is a healthy eating plan that has been shown to reduce high blood pressure (hypertension). Additional health benefits may include reducing the risk of type 2 diabetes mellitus, heart disease, and stroke. The DASH eating plan may also help with weight loss. WHAT DO I NEED TO KNOW ABOUT THE DASH EATING PLAN? For the DASH eating plan, you will follow these general guidelines:  Choose foods with a percent daily value for sodium of less than 5% (as listed on the food label).  Use salt-free seasonings or herbs instead of table salt or sea salt.  Check with your health care provider or pharmacist before using salt substitutes.  Eat lower-sodium products, often labeled as "lower sodium" or "no salt added."  Eat fresh foods.  Eat more vegetables, fruits, and low-fat dairy products.  Choose whole grains. Look for the word "whole" as the first word in the ingredient list.  Choose fish and skinless chicken or turkey more often than red meat. Limit fish, poultry, and meat to 6 oz (170 g) each day.  Limit sweets, desserts, sugars, and sugary drinks.  Choose heart-healthy fats.  Limit cheese to 1 oz (28 g) per day.  Eat more home-cooked food and less restaurant, buffet, and fast food.  Limit fried foods.  Cook foods using methods other than frying.  Limit canned vegetables. If you do use them, rinse them well to decrease the sodium.  When eating at a restaurant, ask that your food be prepared with less salt, or no salt if possible. WHAT FOODS CAN I EAT? Seek help from a dietitian for individual calorie needs. Grains Whole grain or whole wheat bread. Brown rice. Whole grain or whole wheat pasta. Quinoa, bulgur, and whole grain cereals. Low-sodium cereals. Corn or whole wheat flour tortillas. Whole grain cornbread. Whole grain crackers. Low-sodium crackers. Vegetables Fresh or frozen vegetables  (raw, steamed, roasted, or grilled). Low-sodium or reduced-sodium tomato and vegetable juices. Low-sodium or reduced-sodium tomato sauce and paste. Low-sodium or reduced-sodium canned vegetables.  Fruits All fresh, canned (in natural juice), or frozen fruits. Meat and Other Protein Products Ground beef (85% or leaner), grass-fed beef, or beef trimmed of fat. Skinless chicken or turkey. Ground chicken or turkey. Pork trimmed of fat. All fish and seafood. Eggs. Dried beans, peas, or lentils. Unsalted nuts and seeds. Unsalted canned beans. Dairy Low-fat dairy products, such as skim or 1% milk, 2% or reduced-fat cheeses, low-fat ricotta or cottage cheese, or plain low-fat yogurt. Low-sodium or reduced-sodium cheeses. Fats and Oils Tub margarines without trans fats. Light or reduced-fat mayonnaise and salad dressings (reduced sodium). Avocado. Safflower, olive, or canola oils. Natural peanut or almond butter. Other Unsalted popcorn and pretzels. The items listed above may not be a complete list of recommended foods or beverages. Contact your dietitian for more options. WHAT FOODS ARE NOT RECOMMENDED? Grains White bread. White pasta. White rice. Refined cornbread. Bagels and croissants. Crackers that contain trans fat. Vegetables Creamed or fried vegetables. Vegetables in a cheese sauce. Regular canned vegetables. Regular canned tomato sauce and paste. Regular tomato and vegetable juices. Fruits Dried fruits. Canned fruit in light or heavy syrup. Fruit juice. Meat and Other Protein Products Fatty cuts of meat. Ribs, chicken wings, bacon, sausage, bologna, salami, chitterlings, fatback, hot dogs, bratwurst, and packaged luncheon meats. Salted nuts and seeds. Canned beans with salt. Dairy Whole or 2% milk, cream, half-and-half, and cream cheese. Whole-fat or sweetened yogurt. Full-fat   cheeses or blue cheese. Nondairy creamers and whipped toppings. Processed cheese, cheese spreads, or cheese  curds. Condiments Onion and garlic salt, seasoned salt, table salt, and sea salt. Canned and packaged gravies. Worcestershire sauce. Tartar sauce. Barbecue sauce. Teriyaki sauce. Soy sauce, including reduced sodium. Steak sauce. Fish sauce. Oyster sauce. Cocktail sauce. Horseradish. Ketchup and mustard. Meat flavorings and tenderizers. Bouillon cubes. Hot sauce. Tabasco sauce. Marinades. Taco seasonings. Relishes. Fats and Oils Butter, stick margarine, lard, shortening, ghee, and bacon fat. Coconut, palm kernel, or palm oils. Regular salad dressings. Other Pickles and olives. Salted popcorn and pretzels. The items listed above may not be a complete list of foods and beverages to avoid. Contact your dietitian for more information. WHERE CAN I FIND MORE INFORMATION? National Heart, Lung, [employer] Institute: www.nhlbi.nih.gov/health/health-topics/topics/dash/   This information is not intended to replace advice given to you by your health care provider. Make sure you discuss any questions you have with your health care provider.   Document Released: 01/17/2011 Document Revised: 02/18/2014 Document Reviewed: 12/02/2012 Elsevier Interactive Patient Education 2016 Elsevier Inc[employer] Hypertension Hypertension, commonly called high blood pressure, is when the force of blood pumping through your arteries is too strong. Your arteries are the blood vessels that carry blood from your heart throughout your body. A blood pressure reading consists of a higher number over a lower number, such as 110/72. The higher number (systolic) is the pressure inside your arteries when your heart pumps. The lower number (diastolic) is the pressure inside your arteries when your heart relaxes. Ideally you want your blood pressure below 120/80. Hypertension forces your heart to work harder to pump blood. Your arteries may become narrow or stiff. Having untreated or uncontrolled hypertension can cause heart attack, stroke, kidney  disease, and other problems. RISK FACTORS Some risk factors for high blood pressure are controllable. Others are not.  Risk factors you cannot control include:   Race. You may be at higher risk if you are African American.  Age. Risk increases with age.  Gender. Men are at higher risk than women before age 45 years. After age 65, women are at higher risk than men. Risk factors you can control include:  Not getting enough exercise or physical activity.  Being overweight.  Getting too much fat, sugar, calories, or salt in your diet.  Drinking too much alcohol. SIGNS AND SYMPTOMS Hypertension does not usually cause signs or symptoms. Extremely high blood pressure (hypertensive crisis) may cause headache, anxiety, shortness of breath, and nosebleed. DIAGNOSIS To check if you have hypertension, your health care provider will measure your blood pressure while you are seated, with your arm held at the level of your heart. It should be measured at least twice using the same arm. Certain conditions can cause a difference in blood pressure between your right and left arms. A blood pressure reading that is higher than normal on one occasion does not mean that you need treatment. If it is not clear whether you have high blood pressure, you may be asked to return on a different day to have your blood pressure checked again. Or, you may be asked to monitor your blood pressure at home for 1 or more weeks. TREATMENT Treating high blood pressure includes making lifestyle changes and possibly taking medicine. Living a healthy lifestyle can help lower high blood pressure. You may need to change some of your habits. Lifestyle changes may include:  Following the DASH diet. This diet is high in fruits, vegetables, and whole grains.   It is low in salt, red meat, and added sugars.  Keep your sodium intake below 2,300 mg per day.  Getting at least 30-45 minutes of aerobic exercise at least 4 times per  week.  Losing weight if necessary.  Not smoking.  Limiting alcoholic beverages.  Learning ways to reduce stress. Your health care provider may prescribe medicine if lifestyle changes are not enough to get your blood pressure under control, and if one of the following is true:  You are 18-59 years of age and your systolic blood pressure is above 140.  You are 60 years of age or older, and your systolic blood pressure is above 150.  Your diastolic blood pressure is above 90.  You have diabetes, and your systolic blood pressure is over 140 or your diastolic blood pressure is over 90.  You have kidney disease and your blood pressure is above 140/90.  You have heart disease and your blood pressure is above 140/90. Your personal target blood pressure may vary depending on your medical conditions, your age, and other factors. HOME CARE INSTRUCTIONS  Have your blood pressure rechecked as directed by your health care provider.   Take medicines only as directed by your health care provider. Follow the directions carefully. Blood pressure medicines must be taken as prescribed. The medicine does not work as well when you skip doses. Skipping doses also puts you at risk for problems.  Do not smoke.   Monitor your blood pressure at home as directed by your health care provider. SEEK MEDICAL CARE IF:   You think you are having a reaction to medicines taken.  You have recurrent headaches or feel dizzy.  You have swelling in your ankles.  You have trouble with your vision. SEEK IMMEDIATE MEDICAL CARE IF:  You develop a severe headache or confusion.  You have unusual weakness, numbness, or feel faint.  You have severe chest or abdominal pain.  You vomit repeatedly.  You have trouble breathing. MAKE SURE YOU:   Understand these instructions.  Will watch your condition.  Will get help right away if you are not doing well or get worse.   This information is not intended to  replace advice given to you by your health care provider. Make sure you discuss any questions you have with your health care provider.   Document Released: 01/28/2005 Document Revised: 06/14/2014 Document Reviewed: 11/20/2012 Elsevier Interactive Patient Education 2016 Elsevier Inc. Diabetes and Exercise Exercising regularly is important. It is not just about losing weight. It has many health benefits, such as:  Improving your overall fitness, flexibility, and endurance.  Increasing your bone density.  Helping with weight control.  Decreasing your body fat.  Increasing your muscle strength.  Reducing stress and tension.  Improving your overall health. People with diabetes who exercise gain additional benefits because exercise:  Reduces appetite.  Improves the body's use of blood sugar (glucose).  Helps lower or control blood glucose.  Decreases blood pressure.  Helps control blood lipids (such as cholesterol and triglycerides).  Improves the body's use of the hormone insulin by:  Increasing the body's insulin sensitivity.  Reducing the body's insulin needs.  Decreases the risk for heart disease because exercising:  Lowers cholesterol and triglycerides levels.  Increases the levels of good cholesterol (such as high-density lipoproteins [HDL]) in the body.  Lowers blood glucose levels. YOUR ACTIVITY PLAN  Choose an activity that you enjoy, and set realistic goals. To exercise safely, you should begin practicing any new physical   activity slowly, and gradually increase the intensity of the exercise over time. Your health care provider or diabetes educator can help create an activity plan that works for you. General recommendations include:  Encouraging children to engage in at least 60 minutes of physical activity each day.  Stretching and performing strength training exercises, such as yoga or weight lifting, at least 2 times per week.  Performing a total of at least  150 minutes of moderate-intensity exercise each week, such as brisk walking or water aerobics.  Exercising at least 3 days per week, making sure you allow no more than 2 consecutive days to pass without exercising.  Avoiding long periods of inactivity (90 minutes or more). When you have to spend an extended period of time sitting down, take frequent breaks to walk or stretch. RECOMMENDATIONS FOR EXERCISING WITH TYPE 1 OR TYPE 2 DIABETES   Check your blood glucose before exercising. If blood glucose levels are greater than 240 mg/dL, check for urine ketones. Do not exercise if ketones are present.  Avoid injecting insulin into areas of the body that are going to be exercised. For example, avoid injecting insulin into:  The arms when playing tennis.  The legs when jogging.  Keep a record of:  Food intake before and after you exercise.  Expected peak times of insulin action.  Blood glucose levels before and after you exercise.  The type and amount of exercise you have done.  Review your records with your health care provider. Your health care provider will help you to develop guidelines for adjusting food intake and insulin amounts before and after exercising.  If you take insulin or oral hypoglycemic agents, watch for signs and symptoms of hypoglycemia. They include:  Dizziness.  Shaking.  Sweating.  Chills.  Confusion.  Drink plenty of water while you exercise to prevent dehydration or heat stroke. Body water is lost during exercise and must be replaced.  Talk to your health care provider before starting an exercise program to make sure it is safe for you. Remember, almost any type of activity is better than none.   This information is not intended to replace advice given to you by your health care provider. Make sure you discuss any questions you have with your health care provider.   Document Released: 04/20/2003 Document Revised: 06/14/2014 Document Reviewed:  07/07/2012 Elsevier Interactive Patient Education 2016 Elsevier Inc. Basic Carbohydrate Counting for Diabetes Mellitus Carbohydrate counting is a method for keeping track of the amount of carbohydrates you eat. Eating carbohydrates naturally increases the level of sugar (glucose) in your blood, so it is important for you to know the amount that is okay for you to have in every meal. Carbohydrate counting helps keep the level of glucose in your blood within normal limits. The amount of carbohydrates allowed is different for every person. A dietitian can help you calculate the amount that is right for you. Once you know the amount of carbohydrates you can have, you can count the carbohydrates in the foods you want to eat. Carbohydrates are found in the following foods:  Grains, such as breads and cereals.  Dried beans and soy products.  Starchy vegetables, such as potatoes, peas, and corn.  Fruit and fruit juices.  Milk and yogurt.  Sweets and snack foods, such as cake, cookies, candy, chips, soft drinks, and fruit drinks. CARBOHYDRATE COUNTING There are two ways to count the carbohydrates in your food. You can use either of the methods or a combination of   both. Reading the "Nutrition Facts" on Packaged Food The "Nutrition Facts" is an area that is included on the labels of almost all packaged food and beverages in the [place]. It includes the serving size of that food or beverage and information about the nutrients in each serving of the food, including the grams (g) of carbohydrate per serving.  Decide the number of servings of this food or beverage that you will be able to eat or drink. Multiply that number of servings by the number of grams of carbohydrate that is listed on the label for that serving. The total will be the amount of carbohydrates you will be having when you eat or drink this food or beverage. Learning Standard Serving Sizes of Food When you eat food that is not  packaged or does not include "Nutrition Facts" on the label, you need to measure the servings in order to count the amount of carbohydrates.A serving of most carbohydrate-rich foods contains about 15 g of carbohydrates. The following list includes serving sizes of carbohydrate-rich foods that provide 15 g ofcarbohydrate per serving:   1 slice of bread (1 oz) or 1 six-inch tortilla.    of a hamburger bun or English muffin.  4-6 crackers.   cup unsweetened dry cereal.    cup hot cereal.   cup rice or pasta.    cup mashed potatoes or  of a large baked potato.  1 cup fresh fruit or one small piece of fruit.    cup canned or frozen fruit or fruit juice.  1 cup milk.   cup plain fat-free yogurt or yogurt sweetened with artificial sweeteners.   cup cooked dried beans or starchy vegetable, such as peas, corn, or potatoes.  Decide the number of standard-size servings that you will eat. Multiply that number of servings by 15 (the grams of carbohydrates in that serving). For example, if you eat 2 cups of strawberries, you will have eaten 2 servings and 30 g of carbohydrates (2 servings x 15 g = 30 g). For foods such as soups and casseroles, in which more than one food is mixed in, you will need to count the carbohydrates in each food that is included. EXAMPLE OF CARBOHYDRATE COUNTING Sample Dinner  3 oz chicken breast.   cup of brown rice.   cup of corn.  1 cup milk.   1 cup strawberries with sugar-free whipped topping.  Carbohydrate Calculation Step 1: Identify the foods that contain carbohydrates:   Rice.   Corn.   Milk.   Strawberries. Step 2:Calculate the number of servings eaten of each:   2 servings of rice.   1 serving of corn.   1 serving of milk.   1 serving of strawberries. Step 3: Multiply each of those number of servings by 15 g:   2 servings of rice x 15 g = 30 g.   1 serving of corn x 15 g = 15 g.   1 serving of milk x 15  g = 15 g.   1 serving of strawberries x 15 g = 15 g. Step 4: Add together all of the amounts to find the total grams of carbohydrates eaten: 30 g + 15 g + 15 g + 15 g = 75 g.   This information is not intended to replace advice given to you by your health care provider. Make sure you discuss any questions you have with your health care provider.   Document Released: 01/28/2005 Document Revised: 02/18/2014 Document   Reviewed: 12/25/2012 Elsevier Interactive Patient Education 2016 Elsevier Inc.  

## 2015-06-08 NOTE — Progress Notes (Signed)
[name], female   DOB: 1955/09/21, 60 y.o.   MRN: [id number]   Tiffany Strickland, is a 60 y.o. female  [id number]  [id number]  DOB - [birth year]  No chief complaint on file.       Subjective:   Tiffany Strickland is a 61 y.o. female with history of hypertension, hypothyroidism, diet-controlled diabetes, last hemoglobin A1c was 7.2% here today for a follow up visit and medication refill. Patient hemoglobin A1c today is 6.7%, still wants to continue diet control and exercise. Patient has no major complaint today. She is due for colonoscopy. She is also due for ophthalmology and podiatry evaluation. Patient is compliant with her medications, she reports no side effects, her blood pressure is well controlled. She does not smoke cigarettes, she does not drink alcohol. Patient has No headache, No chest pain, No abdominal pain - No Nausea, No new weakness tingling or numbness, No Cough - SOB.  Problem  Type 2 Diabetes Mellitus Without Complication, Without Long-Term Current Use of Insulin ([facility])  Colon Cancer Screening    ALLERGIES: Allergies  Allergen Reactions  . Pollen Extract Other (See Comments)    Reaction unknown  . Tomato Other (See Comments)    Reaction unknown  . Wool Alcohol [Lanolin] Other (See Comments)    Reaction unknown    PAST MEDICAL HISTORY: Past Medical History  Diagnosis Date  . Hypertension   . Stroke ([place])   . Thyroid disease   . Hyperlipidemia   . Diabetes mellitus without complication ([place])   . Colon polyp     adenomatous    MEDICATIONS AT HOME: Prior to Admission medications   Medication Sig Start Date End Date Taking? Authorizing Provider  albuterol (PROVENTIL HFA;VENTOLIN HFA) 108 (90 BASE) MCG/ACT inhaler Inhale 1-2 puffs into the lungs every 6 (six) hours as needed for wheezing or shortness of breath. 11/30/14   Clayton Bibles, PA-C  aspirin EC 325 MG EC tablet Take 1 tablet (325 mg total) by mouth daily. 06/16/14   Tresa Garter, MD  levothyroxine (SYNTHROID, LEVOTHROID) 137 MCG tablet Take 1 tablet (137 mcg total) by mouth daily before breakfast. 06/08/15   Tresa Garter, MD  lisinopril-hydrochlorothiazide (PRINZIDE,ZESTORETIC) 20-25 MG tablet Take 1 tablet by mouth every morning. 06/08/15   Tresa Garter, MD  loratadine (CLARITIN) 10 MG tablet Take 1 tablet (10 mg total) by mouth daily. 06/08/15   Tresa Garter, MD  polyethylene glycol (MIRALAX / GLYCOLAX) packet Take 17 g by mouth daily. 06/08/15   Tresa Garter, MD  Vitamin D, Ergocalciferol, (DRISDOL) 50000 UNITS CAPS capsule Take 1 capsule (50,000 Units total) by mouth every 7 (seven) days. 12/16/14   Tresa Garter, MD     Objective:   There were no vitals filed for this visit.  Exam General appearance : Awake, alert, not in any distress. Speech Clear. Not toxic looking HEENT: Atraumatic and Normocephalic, pupils equally reactive to light and accomodation Neck: supple, no JVD. No cervical lymphadenopathy.  Chest: Good air entry bilaterally, no added sounds  CVS: S1 S2 regular, no murmurs.  Abdomen: Bowel sounds present, Non tender and not distended with no gaurding, rigidity or rebound. Extremities: B/L Lower Ext shows mild pitting edema, both legs are warm to touch Neurology: Awake alert, and oriented X 3, CN II-XII intact, Non focal Skin: No Rash  Data Review Lab Results  Component Value Date   HGBA1C 7.20 12/15/2014   HGBA1C 6.70 06/16/2014   HGBA1C 6.4 01/31/2014  Assessment & Plan   1. Other specified hypothyroidism  - levothyroxine (SYNTHROID, LEVOTHROID) 137 MCG tablet; Take 1 tablet (137 mcg total) by mouth daily before breakfast.  Dispense: 90 tablet; Refill: 3  2. Other seasonal allergic rhinitis  - loratadine (CLARITIN) 10 MG tablet; Take 1 tablet (10 mg total) by mouth daily.  Dispense: 30 tablet; Refill: 11  3. Type 2 diabetes mellitus without complication, without long-term current use of  insulin ([place])  - Diet control - Ambulatory referral to Podiatry - Ambulatory referral to Ophthalmology  4. Colon cancer screening  - polyethylene glycol (MIRALAX / GLYCOLAX) packet; Take 17 g by mouth daily.  Dispense: 100 each; Refill: 3 - Ambulatory referral to Gastroenterology  5. Essential hypertension  - lisinopril-hydrochlorothiazide (PRINZIDE,ZESTORETIC) 20-25 MG tablet; Take 1 tablet by mouth every morning.  Dispense: 90 tablet; Refill: 3  We have discussed target BP range and blood pressure goal. I have advised patient to check BP regularly and to call us back or report to clinic if the numbers are consistently higher than 140/90. We discussed the importance of compliance with medical therapy and DASH diet recommended, consequences of uncontrolled hypertension discussed.   - continue current BP medications  Patient have been counseled extensively about nutrition and exercise  Return in about 6 months (around 12/08/2015) for Hemoglobin A1C and Follow up, DM, Follow up HTN.  The patient was given clear instructions to go to ER or return to medical center if symptoms don't improve, worsen or new problems develop. The patient verbalized understanding. The patient was told to call to get lab results if they haven't heard anything in the next week.   This note has been created with Surveyor, quantity. Any transcriptional errors are unintentional.    Angelica Chessman, MD, [place], [place], [place], [place] and [place], [place]   06/08/2015, 6:00 PM

## 2015-06-08 NOTE — Progress Notes (Signed)
Patient is here for FU DM  Patient complains of left ankle pain feeling like a sprain constantly. Pain is scaled currently at a 6.

## 2015-06-09 MED FILL — LISINOPRIL-HCTZ 20-25 MG TA: 20-25 | 30 days supply | Qty: 30 | Fill #0

## 2015-06-09 MED FILL — POLYETHYLENE GLYCOL 3350: 30 days supply | Qty: 510 | Fill #0

## 2015-06-13 MED FILL — $Synthroid 137mcg tablet: 137 | 30 days supply | Qty: 30 | Fill #0

## 2015-06-20 ENCOUNTER — Ambulatory Visit (HOSPITAL_COMMUNITY)
Admission: EM | Admit: 2015-06-20 | Discharge: 2015-06-20 | Disposition: A | Discharge status: Home / Self Care | Payer: No Typology Code available for payment source | Attending: Emergency Medicine | Admitting: Emergency Medicine

## 2015-06-20 ENCOUNTER — Encounter (HOSPITAL_COMMUNITY): Payer: Self-pay | Admitting: Emergency Medicine

## 2015-06-20 DIAGNOSIS — M722 Plantar fascial fibromatosis: Secondary | ICD-10-CM

## 2015-06-20 DIAGNOSIS — M25572 Pain in left ankle and joints of left foot: Secondary | ICD-10-CM

## 2015-06-20 MED ORDER — MELOXICAM 7.5 MG PO TABS: 7.5000 mg | ORAL_TABLET | Freq: Every day | ORAL | Status: DC

## 2015-06-20 NOTE — ED Provider Notes (Signed)
CSN: [id number]     Arrival date & time 06/20/15  1625 History   First MD Initiated Contact with Patient 06/20/15 1712     Chief Complaint  Patient presents with  . Foot Pain   (Consider location/radiation/quality/duration/timing/severity/associated sxs/prior Treatment) HPI  She is a 60 year old woman here for evaluation of left foot pain. She states for the last week she has had some discomfort in the medial left ankle. She states it feels like she sprained her ankle, but does not remember any injury or trauma. Over the last several days she has developed pain of the plantar heel. This is worse with the first few steps and then eases up, but does not resolve. She has been wrapping her foot and ankle and Ace wrap with some improvement.  Past Medical History  Diagnosis Date  . Hypertension   . Stroke ([place])   . Thyroid disease   . Hyperlipidemia   . Diabetes mellitus without complication ([place])   . Colon polyp     adenomatous   Past Surgical History  Procedure Laterality Date  . Abdominal hysterectomy    . Tubal ligation    . Bladder suspension    . Knee surgery    . Eus N/A 08/12/2013    Procedure: LOWER ENDOSCOPIC ULTRASOUND (EUS);  Surgeon: Milus Banister, MD;  Location: [facility];  Service: Endoscopy;  Laterality: N/A;   Family History  Problem Relation Age of Onset  . Hypertension Mother   . Heart disease Maternal Grandmother   . Cancer Maternal Grandmother     ovarian  . Cancer Maternal Grandfather   . Hypertension Other   . Hyperlipidemia Other   . Cancer Other   . Sleep apnea Other   . Obesity Other    Social History  Substance Use Topics  . Smoking status: Never Smoker   . Smokeless tobacco: Never Used  . Alcohol Use: No   OB History    Gravida Para Term Preterm AB TAB SAB Ectopic Multiple Living   6 5 5  1  1   5      Review of Systems As in history of present illness Allergies  Pollen extract; Tomato; and Wool alcohol  Home Medications   Prior to  Admission medications   Medication Sig Start Date End Date Taking? Authorizing Provider  albuterol (PROVENTIL HFA;VENTOLIN HFA) 108 (90 BASE) MCG/ACT inhaler Inhale 1-2 puffs into the lungs every 6 (six) hours as needed for wheezing or shortness of breath. 11/30/14   Clayton Bibles, PA-C  levothyroxine (SYNTHROID, LEVOTHROID) 137 MCG tablet Take 1 tablet (137 mcg total) by mouth daily before breakfast. 06/08/15   Tresa Garter, MD  lisinopril-hydrochlorothiazide (PRINZIDE,ZESTORETIC) 20-25 MG tablet Take 1 tablet by mouth every morning. 06/08/15   Tresa Garter, MD  loratadine (CLARITIN) 10 MG tablet Take 1 tablet (10 mg total) by mouth daily. 06/08/15   Tresa Garter, MD  meloxicam (MOBIC) 7.5 MG tablet Take 1 tablet (7.5 mg total) by mouth daily. 06/20/15   Melony Overly, MD  polyethylene glycol (MIRALAX / GLYCOLAX) packet Take 17 g by mouth daily. 06/08/15   Tresa Garter, MD  Vitamin D, Ergocalciferol, (DRISDOL) 50000 UNITS CAPS capsule Take 1 capsule (50,000 Units total) by mouth every 7 (seven) days. 12/16/14   Tresa Garter, MD   Meds Ordered and Administered this Visit  Medications - No data to display  BP 148/58 mmHg  Pulse 75  Temp(Src) 97.5 F (36.4 C) (Oral)  Resp 14  SpO2 99% No data found.   Physical Exam  Constitutional: She is oriented to person, place, and time. She appears well-developed and well-nourished. No distress.  Cardiovascular: Normal rate.   Pulmonary/Chest: Effort normal.  Musculoskeletal:  Left ankle: She does have some pitting edema, which she states is a chronic issue. No erythema or focal swelling. She is tender over the deltoid ligament. No bony tenderness. Left foot: 2+ DP pulse. She is tender at the origin of the plantar fascia. This pain radiates up the arch of her foot towards the deltoid ligament. No wounds or injuries.  Neurological: She is alert and oriented to person, place, and time.    ED Course  Procedures (including  critical care time)  Labs Review Labs Reviewed - No data to display  Imaging Review No results found.   MDM   1. Left ankle pain   2. Plantar fasciitis, left    Conservative management with Ace wrap, ice massage, meloxicam. Recommended heel cup for the left shoe. Follow-up with orthopedics if not improving.    Melony Overly, MD 06/20/15 1758

## 2015-06-20 NOTE — ED Notes (Signed)
Patient c/o left mid foot and ankle pain x 1 week. Patient reports she is diabetic and generally has ankle pain. Patient is in NAD.

## 2015-06-20 NOTE — Discharge Instructions (Signed)
You have developed some plantar fasciitis in your foot. Take meloxicam daily for the next week, then as needed. Freeze a small bottle of water and roll your foot on this 3 times a day. Initially, this will be uncomfortable. You can alternate ice massage with Epsom salts soaks. Get a heel cup for your left shoe. This will help take the strain off the plantar fascia. Continue to wrap your foot and ankle. It should go around your ankle and around the arch of your foot. This should gradually improve over the next week or so. If it is not getting better, please follow-up with Dr. Lyla Glassing in orthopedics.

## 2015-07-19 ENCOUNTER — Ambulatory Visit: Payer: No Typology Code available for payment source

## 2015-07-19 ENCOUNTER — Ambulatory Visit: Payer: No Typology Code available for payment source | Admitting: Podiatry

## 2015-07-19 ENCOUNTER — Telehealth: Payer: Self-pay | Admitting: *Deleted

## 2015-07-19 ENCOUNTER — Encounter: Payer: Self-pay | Admitting: Podiatry

## 2015-07-19 VITALS — BP 114/64 | HR 70 | Resp 12

## 2015-07-19 DIAGNOSIS — M79672 Pain in left foot: Secondary | ICD-10-CM

## 2015-07-19 DIAGNOSIS — R0989 Other specified symptoms and signs involving the circulatory and respiratory systems: Secondary | ICD-10-CM

## 2015-07-19 DIAGNOSIS — M25472 Effusion, left ankle: Secondary | ICD-10-CM

## 2015-07-19 MED FILL — POLYETHYLENE GLYCOL 3350: 30 days supply | Qty: 510 | Fill #1

## 2015-07-19 MED FILL — $Synthroid 137mcg tablet: 137 | 30 days supply | Qty: 30 | Fill #1

## 2015-07-19 NOTE — Patient Instructions (Signed)
Today your examination revealed a normal x-ray of your foot and ankle Ordering a vascular examination, lower extremity arterial Doppler, both legs to check circulation Ordering a venous Doppler left lower extremity to rule out any clots in the left lower leg Will contact you upon receipt of the vascular examination   Diabetes and Foot Care Diabetes may cause you to have problems because of poor blood supply (circulation) to your feet and legs. This may cause the skin on your feet to become thinner, break easier, and heal more slowly. Your skin may become dry, and the skin may peel and crack. You may also have nerve damage in your legs and feet causing decreased feeling in them. You may not notice minor injuries to your feet that could lead to infections or more serious problems. Taking care of your feet is one of the most important things you can do for yourself.  HOME CARE INSTRUCTIONS  Wear shoes at all times, even in the house. Do not go barefoot. Bare feet are easily injured.  Check your feet daily for blisters, cuts, and redness. If you cannot see the bottom of your feet, use a mirror or ask someone for help.  Wash your feet with warm water (do not use hot water) and mild soap. Then pat your feet and the areas between your toes until they are completely dry. Do not soak your feet as this can dry your skin.  Apply a moisturizing lotion or petroleum jelly (that does not contain alcohol and is unscented) to the skin on your feet and to dry, brittle toenails. Do not apply lotion between your toes.  Trim your toenails straight across. Do not dig under them or around the cuticle. File the edges of your nails with an emery board or nail file.  Do not cut corns or calluses or try to remove them with medicine.  Wear clean socks or stockings every day. Make sure they are not too tight. Do not wear knee-high stockings since they may decrease blood flow to your legs.  Wear shoes that fit properly  and have enough cushioning. To break in new shoes, wear them for just a few hours a day. This prevents you from injuring your feet. Always look in your shoes before you put them on to be sure there are no objects inside.  Do not cross your legs. This may decrease the blood flow to your feet.  If you find a minor scrape, cut, or break in the skin on your feet, keep it and the skin around it clean and dry. These areas may be cleansed with mild soap and water. Do not cleanse the area with peroxide, alcohol, or iodine.  When you remove an adhesive bandage, be sure not to damage the skin around it.  If you have a wound, look at it several times a day to make sure it is healing.  Do not use heating pads or hot water bottles. They may burn your skin. If you have lost feeling in your feet or legs, you may not know it is happening until it is too late.  Make sure your health care provider performs a complete foot exam at least annually or more often if you have foot problems. Report any cuts, sores, or bruises to your health care provider immediately. SEEK MEDICAL CARE IF:   You have an injury that is not healing.  You have cuts or breaks in the skin.  You have an ingrown nail.  You  notice redness on your legs or feet.  You feel burning or tingling in your legs or feet.  You have pain or cramps in your legs and feet.  Your legs or feet are numb.  Your feet always feel cold. SEEK IMMEDIATE MEDICAL CARE IF:   There is increasing redness, swelling, or pain in or around a wound.  There is a red line that goes up your leg.  Pus is coming from a wound.  You develop a fever or as directed by your health care provider.  You notice a bad smell coming from an ulcer or wound.   This information is not intended to replace advice given to you by your health care provider. Make sure you discuss any questions you have with your health care provider.   Document Released: 01/26/2000 Document  Revised: 09/30/2012 Document Reviewed: 07/07/2012 Elsevier Interactive Patient Education [employer].

## 2015-07-19 NOTE — Progress Notes (Signed)
   Subjective:    [name], female    DOB: Jun 21, 1955, 60 y.o.   MRN: [id number]  HPI  Patient presents today complaining of one-month history of swelling in the left foot and ankle area increasing with standing and walking and reduces with elevation and rest. She describes a gradual increase of soreness in the area over this past month with weightbearing and again reduces with rest and elevation. She is attempting soaking in Epsom salts without any change of the symptoms.  Patient is a diabetic and denies any history of foot ulceration, claudication or amputation  Review of Systems  Musculoskeletal: Positive for gait problem.       Objective:   Physical Exam  Orientated 3  Vascular: Mild nonpitting peripheral edema bilaterally Calf tenderness left without edema No calf tenderness right DP pulse 1/4 bilaterally PT pulse 1/4 right and 0/4pulse left Capillary reflex delayed bilaterally  Neurological: Sensation to 10 g monofilament wire intact 5/5 bilaterally Vibratory sensation reactive bilaterally Ankle reflex equal and reactive bilaterally  Dermatological: Fatty deposits medial lateral ankles bilaterally  Musculoskeletal: No crepitus or pain in range of motion ankles bilaterally Palpable tenderness medial lateral anterior left ankle without any palpable lesions Palpable tenderness mid fascial bands bilaterally without any palpable lesions  X-ray examination weightbearing left ankle and left foot dated 07/19/2015  Ankle views No fracture and/or dislocation Intact bony structure without fracture and/or dislocation Increased soft tissue density in all views No emphysema Ankle mortise is adequate  Radiographic impression left ankle: No acute bony abnormality noted left ankle x-ray dated 07/19/2015  Left foot x-ray exam Intact bony structure without fracture and/or dislocation Increased soft tissue density in all views HAV Hammertoe  second  Radiographic impression left foot: No acute bony abnormality noted in the left foot an x-ray of 07/19/2015     Assessment & Plan:   Assessment: Decrease in absent pedal pulses Edema left ankle rule out vascular or venous thrombosis  Plan: At this time I reviewed the results of examination x-ray today. I told patient I want to concentrate on her vascular status Patient referred to the vascular lab for lower extremity arterial examination bilaterally with ABIs and TBI for the indication of diabetic with decreased or absent pedal pulses Lower extremity venous Doppler left for the indication of calf pain left with swelling  Notify patient on receipt of vascular examination or follow-up Limit standing walking Patient will present to [place] she has any significant increase in pain, swelling

## 2015-07-19 NOTE — Telephone Encounter (Addendum)
-----   Message from Elta Guadeloupe sent at 07/19/2015  8:54 AM EDT ----- Regarding: LE EXTREMITY ARTERIAL DOPPLER WITH ABI/TBI,  VENOUS DOPPLER LT FOOT ORDERS ARE IN!  PLEASE MAKE AN APPOINTMENT ASAP BY DR. Amalia Hailey....THANKS. Faxed to [facility].

## 2015-07-21 ENCOUNTER — Other Ambulatory Visit: Payer: Self-pay | Admitting: Podiatry

## 2015-07-21 DIAGNOSIS — M25472 Effusion, left ankle: Secondary | ICD-10-CM

## 2015-07-21 DIAGNOSIS — M79672 Pain in left foot: Secondary | ICD-10-CM

## 2015-07-21 DIAGNOSIS — R0989 Other specified symptoms and signs involving the circulatory and respiratory systems: Secondary | ICD-10-CM

## 2015-07-27 ENCOUNTER — Inpatient Hospital Stay (HOSPITAL_COMMUNITY): Admission: RE | Admit: 2015-07-27 | Payer: No Typology Code available for payment source | Source: Ambulatory Visit

## 2015-08-02 ENCOUNTER — Inpatient Hospital Stay (HOSPITAL_COMMUNITY): Admission: RE | Admit: 2015-08-02 | Payer: No Typology Code available for payment source | Source: Ambulatory Visit

## 2015-08-03 ENCOUNTER — Inpatient Hospital Stay (HOSPITAL_COMMUNITY)
Admission: RE | Admit: 2015-08-03 | Discharge: 2015-08-03 | Disposition: A | Discharge status: Home / Self Care | Payer: No Typology Code available for payment source | Source: Ambulatory Visit

## 2015-08-03 DIAGNOSIS — R0989 Other specified symptoms and signs involving the circulatory and respiratory systems: Secondary | ICD-10-CM

## 2015-08-03 DIAGNOSIS — M79672 Pain in left foot: Secondary | ICD-10-CM

## 2015-08-03 DIAGNOSIS — M25472 Effusion, left ankle: Secondary | ICD-10-CM

## 2015-08-04 ENCOUNTER — Encounter (HOSPITAL_COMMUNITY): Payer: No Typology Code available for payment source

## 2015-08-04 ENCOUNTER — Other Ambulatory Visit: Payer: Self-pay | Admitting: Podiatry

## 2015-08-04 DIAGNOSIS — R0989 Other specified symptoms and signs involving the circulatory and respiratory systems: Secondary | ICD-10-CM

## 2015-08-09 ENCOUNTER — Encounter (HOSPITAL_COMMUNITY): Payer: No Typology Code available for payment source

## 2015-08-11 ENCOUNTER — Ambulatory Visit (HOSPITAL_COMMUNITY)
Admission: RE | Admit: 2015-08-11 | Discharge: 2015-08-11 | Disposition: A | Discharge status: Home / Self Care | Payer: Self-pay | Source: Ambulatory Visit | Attending: Podiatry | Admitting: Podiatry

## 2015-08-11 DIAGNOSIS — M79672 Pain in left foot: Secondary | ICD-10-CM

## 2015-08-11 DIAGNOSIS — I10 Essential (primary) hypertension: Secondary | ICD-10-CM | POA: Insufficient documentation

## 2015-08-11 DIAGNOSIS — M25472 Effusion, left ankle: Secondary | ICD-10-CM

## 2015-08-11 DIAGNOSIS — E119 Type 2 diabetes mellitus without complications: Secondary | ICD-10-CM | POA: Insufficient documentation

## 2015-08-11 DIAGNOSIS — E785 Hyperlipidemia, unspecified: Secondary | ICD-10-CM | POA: Insufficient documentation

## 2015-08-11 DIAGNOSIS — R0989 Other specified symptoms and signs involving the circulatory and respiratory systems: Secondary | ICD-10-CM | POA: Insufficient documentation

## 2015-08-16 ENCOUNTER — Ambulatory Visit (HOSPITAL_COMMUNITY)
Admission: RE | Admit: 2015-08-16 | Discharge: 2015-08-16 | Disposition: A | Discharge status: Home / Self Care | Payer: No Typology Code available for payment source | Source: Ambulatory Visit | Attending: Internal Medicine | Admitting: Internal Medicine

## 2015-08-16 DIAGNOSIS — M25472 Effusion, left ankle: Secondary | ICD-10-CM | POA: Insufficient documentation

## 2015-08-16 DIAGNOSIS — M79672 Pain in left foot: Secondary | ICD-10-CM | POA: Insufficient documentation

## 2015-08-16 DIAGNOSIS — R0989 Other specified symptoms and signs involving the circulatory and respiratory systems: Secondary | ICD-10-CM | POA: Insufficient documentation

## 2015-08-16 DIAGNOSIS — E119 Type 2 diabetes mellitus without complications: Secondary | ICD-10-CM | POA: Insufficient documentation

## 2015-08-16 DIAGNOSIS — Z8673 Personal history of transient ischemic attack (TIA), and cerebral infarction without residual deficits: Secondary | ICD-10-CM | POA: Insufficient documentation

## 2015-08-16 DIAGNOSIS — I10 Essential (primary) hypertension: Secondary | ICD-10-CM | POA: Insufficient documentation

## 2015-08-21 ENCOUNTER — Telehealth: Payer: Self-pay | Admitting: *Deleted

## 2015-08-21 NOTE — Telephone Encounter (Addendum)
-----   Message from Gean Birchwood, DPM sent at 08/16/2015  7:44 AM EDT ----- Lower extremity venous Doppler left dated 08/14/2015 No evidence of left lower extremity deep or superficial venous thrombus or incompetence.  Notify patient there is no evidence of DVT in the left lower leg and no follow-up needed on this vascular examination. 08/21/2015-Informed pt of Dr. Phoebe Perch review of venous doppler.

## 2015-08-22 ENCOUNTER — Telehealth: Payer: Self-pay | Admitting: *Deleted

## 2015-08-22 NOTE — Telephone Encounter (Addendum)
-----   Message from Gean Birchwood, DPM sent at 08/22/2015  7:42 AM EDT -----   No evidence of segmental lower extremity arterial disease at rest, bilaterally. Normal ABI's, bilaterally. Bilateral great toe TBI's are normal. Second digit waveforms are abnormal, bilaterally. Contact patient and advise that arterial Doppler results are adequate and no follow-up needed at this time Keep any previously scheduled visit. Informed pt of Dr. Phoebe Perch review of results.

## 2015-09-20 MED FILL — POLYETHYLENE GLYCOL 3350: 30 days supply | Qty: 510 | Fill #2

## 2015-09-20 MED FILL — LISINOPRIL-HCTZ 20-25 MG TA: 20-25 | 30 days supply | Qty: 30 | Fill #1

## 2015-09-21 MED FILL — $Synthroid 137mcg tablet: 137 | 30 days supply | Qty: 30 | Fill #2

## 2015-09-28 DIAGNOSIS — M722 Plantar fascial fibromatosis: Secondary | ICD-10-CM

## 2015-10-20 ENCOUNTER — Other Ambulatory Visit: Payer: Self-pay | Admitting: *Deleted

## 2015-10-20 DIAGNOSIS — E038 Other specified hypothyroidism: Secondary | ICD-10-CM

## 2015-10-20 MED ORDER — LEVOTHYROXINE SODIUM 137 MCG PO TABS: 137.0000 ug | ORAL_TABLET | Freq: Every day | ORAL | 3 refills | Status: DC

## 2015-10-20 NOTE — Telephone Encounter (Signed)
PRINTED FOR PASS PROGRAM 

## 2015-10-24 DIAGNOSIS — M25472 Effusion, left ankle: Secondary | ICD-10-CM

## 2015-10-26 MED FILL — ?LEVOTHYROXINE 137 MCG TABL: 137 | 30 days supply | Qty: 30 | Fill #3

## 2015-10-26 MED FILL — LISINOPRIL-HCTZ 20-25 MG TA: 20-25 | 30 days supply | Qty: 30 | Fill #2

## 2015-10-26 MED FILL — POLYETHYLENE GLYCOL 3350: 30 days supply | Qty: 510 | Fill #3

## 2015-10-27 LAB — GLUCOSE, POCT (MANUAL RESULT ENTRY): POC GLUCOSE: 120 mg/dL — AB (ref 70–99)

## 2015-11-08 ENCOUNTER — Encounter: Payer: Self-pay | Admitting: Family Medicine

## 2015-11-08 ENCOUNTER — Ambulatory Visit: Payer: Self-pay | Attending: Family Medicine | Admitting: Family Medicine

## 2015-11-08 VITALS — BP 98/67 | HR 67 | Temp 97.8°F | Resp 17 | Ht 65.0 in | Wt 263.6 lb

## 2015-11-08 DIAGNOSIS — E669 Obesity, unspecified: Secondary | ICD-10-CM | POA: Insufficient documentation

## 2015-11-08 DIAGNOSIS — Z6841 Body Mass Index (BMI) 40.0 and over, adult: Secondary | ICD-10-CM | POA: Insufficient documentation

## 2015-11-08 DIAGNOSIS — Z794 Long term (current) use of insulin: Secondary | ICD-10-CM | POA: Insufficient documentation

## 2015-11-08 DIAGNOSIS — E039 Hypothyroidism, unspecified: Secondary | ICD-10-CM | POA: Insufficient documentation

## 2015-11-08 DIAGNOSIS — I10 Essential (primary) hypertension: Secondary | ICD-10-CM | POA: Insufficient documentation

## 2015-11-08 DIAGNOSIS — Z91018 Allergy to other foods: Secondary | ICD-10-CM | POA: Insufficient documentation

## 2015-11-08 DIAGNOSIS — Z90710 Acquired absence of both cervix and uterus: Secondary | ICD-10-CM | POA: Insufficient documentation

## 2015-11-08 DIAGNOSIS — E785 Hyperlipidemia, unspecified: Secondary | ICD-10-CM | POA: Insufficient documentation

## 2015-11-08 DIAGNOSIS — Z86010 Personal history of colonic polyps: Secondary | ICD-10-CM | POA: Insufficient documentation

## 2015-11-08 DIAGNOSIS — E119 Type 2 diabetes mellitus without complications: Secondary | ICD-10-CM | POA: Insufficient documentation

## 2015-11-08 DIAGNOSIS — E1169 Type 2 diabetes mellitus with other specified complication: Secondary | ICD-10-CM

## 2015-11-08 DIAGNOSIS — Z9109 Other allergy status, other than to drugs and biological substances: Secondary | ICD-10-CM | POA: Insufficient documentation

## 2015-11-08 DIAGNOSIS — M722 Plantar fascial fibromatosis: Secondary | ICD-10-CM | POA: Insufficient documentation

## 2015-11-08 DIAGNOSIS — Z8673 Personal history of transient ischemic attack (TIA), and cerebral infarction without residual deficits: Secondary | ICD-10-CM | POA: Insufficient documentation

## 2015-11-08 DIAGNOSIS — K029 Dental caries, unspecified: Secondary | ICD-10-CM

## 2015-11-08 DIAGNOSIS — Z23 Encounter for immunization: Secondary | ICD-10-CM

## 2015-11-08 DIAGNOSIS — E038 Other specified hypothyroidism: Secondary | ICD-10-CM

## 2015-11-08 DIAGNOSIS — L304 Erythema intertrigo: Secondary | ICD-10-CM | POA: Insufficient documentation

## 2015-11-08 LAB — POCT GLYCOSYLATED HEMOGLOBIN (HGB A1C): HEMOGLOBIN A1C: 6.7

## 2015-11-08 LAB — LIPID PANEL
CHOL/HDL RATIO: 3.1 ratio (ref <=5.0)
CHOLESTEROL: 151 mg/dL (ref 125–200)
HDL: 49 mg/dL (ref >=46)
LDL CALC: 89 mg/dL (ref <130)
TRIGLYCERIDES: 66 mg/dL (ref <150)
VLDL: 13 mg/dL (ref <30)

## 2015-11-08 LAB — COMPLETE METABOLIC PANEL WITH GFR
ALT: 13 U/L (ref 6–29)
AST: 13 U/L (ref 10–35)
Albumin: 4 g/dL (ref 3.6–5.1)
Alkaline Phosphatase: 57 U/L (ref 33–130)
BUN: 20 mg/dL (ref 7–25)
CALCIUM: 9.1 mg/dL (ref 8.6–10.4)
CHLORIDE: 102 mmol/L (ref 98–110)
CO2: 31 mmol/L (ref 20–31)
Creat: 0.88 mg/dL (ref 0.50–0.99)
GFR, EST AFRICAN AMERICAN: 81 mL/min (ref >=60)
GFR, EST NON AFRICAN AMERICAN: 71 mL/min (ref >=60)
Glucose, Bld: 77 mg/dL (ref 65–99)
POTASSIUM: 4.3 mmol/L (ref 3.5–5.3)
Sodium: 140 mmol/L (ref 135–146)
Total Bilirubin: 0.4 mg/dL (ref 0.2–1.2)
Total Protein: 6.8 g/dL (ref 6.1–8.1)

## 2015-11-08 LAB — GLUCOSE, POCT (MANUAL RESULT ENTRY): POC GLUCOSE: 87 mg/dL (ref 70–99)

## 2015-11-08 MED ORDER — GLUCOSE BLOOD VI STRP: ORAL_STRIP | 12 refills | Status: DC

## 2015-11-08 MED ORDER — TRUE METRIX METER DEVI: 1.0000 | Freq: Every day | 0 refills | Status: DC

## 2015-11-08 MED ORDER — ATORVASTATIN CALCIUM 20 MG PO TABS: 20.0000 mg | ORAL_TABLET | Freq: Every day | ORAL | 5 refills | Status: DC

## 2015-11-08 MED ORDER — TRUEPLUS LANCETS 28G MISC: 1.0000 | Freq: Every day | 12 refills | Status: DC

## 2015-11-08 MED ORDER — CLOTRIMAZOLE 1 % EX CREA: 1.0000 "application " | TOPICAL_CREAM | Freq: Two times a day (BID) | CUTANEOUS | 1 refills | Status: DC

## 2015-11-08 MED ORDER — ASPIRIN 81 MG PO TBEC: 81.0000 mg | DELAYED_RELEASE_TABLET | Freq: Every day | ORAL | 12 refills | Status: AC

## 2015-11-08 MED ORDER — LISINOPRIL-HYDROCHLOROTHIAZIDE 20-25 MG PO TABS: 1.0000 | ORAL_TABLET | Freq: Every morning | ORAL | 5 refills | Status: DC

## 2015-11-08 MED ORDER — LEVOTHYROXINE SODIUM 137 MCG PO TABS: 137.0000 ug | ORAL_TABLET | Freq: Every day | ORAL | 5 refills | Status: DC

## 2015-11-08 NOTE — Progress Notes (Signed)
Subjective:  [name], female    DOB: 02-16-1955  Age: 60 y.o. MRN: [id number]  CC: Follow-up visit.  HPI Tiffany Strickland is a 60 year old female with a history of type 2 diabetes mellitus (A1c 6.7), obesity, hypothyroidism, hypertension, previous history of stroke who presents today for a follow-up visit.  Diabetes is diet-controlled and she has been compliant but has not been exercising regularly. She endorses taking her antihypertensive and thyroid medications. Would like to have a prescription for a glucometer and test strips.  Complains of occasional unsteady gait and has caught herself deviating to the side while walking and; also noticed an intention tremor of her hands and denies history of Parkinson's. She did not have any residual deficits after the stroke and ambulates without any assistive device. She is unable to demonstrate this in the clinic today.  Does have pain in the heel of her left foot which occasionally wraps around to the ankle and is intermittent. She complains of itchy rash in between both thighs underneath both breasts   Past Medical History:  Diagnosis Date  . Colon polyp    adenomatous  . Diabetes mellitus without complication ([place])   . Hyperlipidemia   . Hypertension   . Stroke ([place])   . Thyroid disease     Past Surgical History:  Procedure Laterality Date  . ABDOMINAL HYSTERECTOMY    . BLADDER SUSPENSION    . EUS N/A 08/12/2013   Procedure: LOWER ENDOSCOPIC ULTRASOUND (EUS);  Surgeon: Milus Banister, MD;  Location: [facility];  Service: Endoscopy;  Laterality: N/A;  . KNEE SURGERY    . TUBAL LIGATION      Allergies  Allergen Reactions  . Pollen Extract Other (See Comments)    Reaction unknown  . Tomato Other (See Comments)    Reaction unknown  . Wool Alcohol [Lanolin] Other (See Comments)    Reaction unknown     Outpatient Medications Prior to Visit  Medication Sig Dispense Refill  . polyethylene glycol (MIRALAX /  GLYCOLAX) packet Take 17 g by mouth daily. 100 each 3  . levothyroxine (SYNTHROID, LEVOTHROID) 137 MCG tablet Take 1 tablet (137 mcg total) by mouth daily before breakfast. 90 tablet 3  . lisinopril-hydrochlorothiazide (PRINZIDE,ZESTORETIC) 20-25 MG tablet Take 1 tablet by mouth every morning. 90 tablet 3  . albuterol (PROVENTIL HFA;VENTOLIN HFA) 108 (90 BASE) MCG/ACT inhaler Inhale 1-2 puffs into the lungs every 6 (six) hours as needed for wheezing or shortness of breath. (Patient not taking: Reported on 11/08/2015) 1 Inhaler 0  . loratadine (CLARITIN) 10 MG tablet Take 1 tablet (10 mg total) by mouth daily. (Patient not taking: Reported on 11/08/2015) 30 tablet 11  . meloxicam (MOBIC) 7.5 MG tablet Take 1 tablet (7.5 mg total) by mouth daily. (Patient not taking: Reported on 11/08/2015) 30 tablet 0  . Vitamin D, Ergocalciferol, (DRISDOL) 50000 UNITS CAPS capsule Take 1 capsule (50,000 Units total) by mouth every 7 (seven) days. (Patient not taking: Reported on 11/08/2015) 12 capsule 0   No facility-administered medications prior to visit.     ROS Review of Systems  Constitutional: Negative for activity change, appetite change and fatigue.  HENT: Negative for congestion, sinus pressure and sore throat.   Eyes: Negative for visual disturbance.  Respiratory: Negative for cough, chest tightness, shortness of breath and wheezing.   Cardiovascular: Negative for chest pain and palpitations.  Gastrointestinal: Negative for abdominal distention, abdominal pain and constipation.  Endocrine: Negative for polydipsia.  Genitourinary: Negative for dysuria  and frequency.  Musculoskeletal:       See hpi   Skin: Positive for rash.  Neurological: Positive for tremors. Negative for light-headedness and numbness.  Hematological: Does not bruise/bleed easily.  Psychiatric/Behavioral: Negative for agitation and behavioral problems.    Objective:  BP 98/67 (BP Location: Left Arm, Patient Position: Sitting,  Cuff Size: Large)   Pulse 67   Temp 97.8 F (36.6 C) (Oral)   Resp 17   Ht 5\' 5"  (1.651 m)   Wt 263 lb 9.6 oz (119.6 kg)   SpO2 97%   BMI 43.87 kg/m   BP/Weight 11/08/2015 [id number]  Systolic BP 98 [id number]  Diastolic BP 67 64 58  Wt. (Lbs) 263.6 - -  BMI 43.87 - -      Physical Exam  Constitutional: She is oriented to person, place, and time. She appears well-developed and well-nourished.  Obese  Cardiovascular: Normal rate, normal heart sounds and intact distal pulses.   No murmur heard. Pulmonary/Chest: Effort normal and breath sounds normal. She has no wheezes. She has no rales. She exhibits no tenderness.  Abdominal: Soft. Bowel sounds are normal. She exhibits no distension and no mass. There is no tenderness.  Musculoskeletal: Normal range of motion.  Neurological: She is alert and oriented to person, place, and time. No cranial nerve deficit. Coordination normal.  Normal gait  Skin:  Intertrigo rash beneath both breasts and between thighs     Lab Results  Component Value Date   HGBA1C 6.7 11/08/2015    Assessment & Plan:   1. Type 2 diabetes mellitus with other specified complication, with long-term current use of insulin (HCC) Diet controlled with A1c of 6.7 Encouraged to have annual eye exam-community resources have been discussed with the patient and given she has no medical coverage - Glucose (CBG) - HgB A1c - Lipid panel - TRUEPLUS LANCETS 28G MISC; 1 each by Does not apply route daily before breakfast.  Dispense: 30 each; Refill: 12 - Blood Glucose Monitoring Suppl (TRUE METRIX METER) DEVI; 1 each by Does not apply route daily before breakfast.  Dispense: 1 Device; Refill: 0 - glucose blood (TRUE METRIX BLOOD GLUCOSE TEST) test strip; Use 1 time daily before breakfast  Dispense: 30 each; Refill: 12  2. Other specified hypothyroidism Controlled - TSH - levothyroxine (SYNTHROID, LEVOTHROID) 137 MCG tablet; Take 1 tablet (137 mcg total) by mouth  daily before breakfast.  Dispense: 30 tablet; Refill: 5  3. Essential hypertension Controlled - COMPLETE METABOLIC PANEL WITH GFR - lisinopril-hydrochlorothiazide (PRINZIDE,ZESTORETIC) 20-25 MG tablet; Take 1 tablet by mouth every morning.  Dispense: 30 tablet; Refill: 5  4. Plantar fasciitis of left foot Use insoles NSAIDs as needed  5. Intertrigo Advised that weight loss will help reduce frequency of rash - clotrimazole (LOTRIMIN) 1 % cream; Apply 1 application topically 2 (two) times daily.  Dispense: 45 g; Refill: 1   I am unable to demonstrate tremors or gait abnormality on my physical exam. She has been advised to change positions slowly and uses an ambulated without assistive device if needed.  Meds ordered this encounter  Medications  . atorvastatin (LIPITOR) 20 MG tablet    Sig: Take 1 tablet (20 mg total) by mouth daily.    Dispense:  30 tablet    Refill:  5  . TRUEPLUS LANCETS 28G MISC    Sig: 1 each by Does not apply route daily before breakfast.    Dispense:  30 each    Refill:  12  .  Blood Glucose Monitoring Suppl (TRUE METRIX METER) DEVI    Sig: 1 each by Does not apply route daily before breakfast.    Dispense:  1 Device    Refill:  0  . glucose blood (TRUE METRIX BLOOD GLUCOSE TEST) test strip    Sig: Use 1 time daily before breakfast    Dispense:  30 each    Refill:  12  . aspirin (ASPIR-81) 81 MG EC tablet    Sig: Take 1 tablet (81 mg total) by mouth daily. Swallow whole.    Dispense:  30 tablet    Refill:  12  . levothyroxine (SYNTHROID, LEVOTHROID) 137 MCG tablet    Sig: Take 1 tablet (137 mcg total) by mouth daily before breakfast.    Dispense:  30 tablet    Refill:  5  . lisinopril-hydrochlorothiazide (PRINZIDE,ZESTORETIC) 20-25 MG tablet    Sig: Take 1 tablet by mouth every morning.    Dispense:  30 tablet    Refill:  5  . clotrimazole (LOTRIMIN) 1 % cream    Sig: Apply 1 application topically 2 (two) times daily.    Dispense:  45 g     Refill:  1    Follow-up: Return in about 3 months (around 02/07/2016) for Follow-up on diabetes mellitus and hypothyroidism.   Arnoldo Morale MD

## 2015-11-08 NOTE — Patient Instructions (Signed)
Plantar Fasciitis Plantar fasciitis is a painful foot condition that affects the heel. It occurs when the band of tissue that connects the toes to the heel bone (plantar fascia) becomes irritated. This can happen after exercising too much or doing other repetitive activities (overuse injury). The pain from plantar fasciitis can range from mild irritation to severe pain that makes it difficult for you to walk or move. The pain is usually worse in the morning or after you have been sitting or lying down for a while. CAUSES This condition may be caused by:  Standing for long periods of time.  Wearing shoes that do not fit.  Doing high-impact activities, including running, aerobics, and ballet.  Being overweight.  Having an abnormal way of walking (gait).  Having tight calf muscles.  Having high arches in your feet.  Starting a new athletic activity. SYMPTOMS The main symptom of this condition is heel pain. Other symptoms include:  Pain that gets worse after activity or exercise.  Pain that is worse in the morning or after resting.  Pain that goes away after you walk for a few minutes. DIAGNOSIS This condition may be diagnosed based on your signs and symptoms. Your health care provider will also do a physical exam to check for:  A tender area on the bottom of your foot.  A high arch in your foot.  Pain when you move your foot.  Difficulty moving your foot. You may also need to have imaging studies to confirm the diagnosis. These can include:  X-rays.  Ultrasound.  MRI. TREATMENT  Treatment for plantar fasciitis depends on the severity of the condition. Your treatment may include:  Rest, ice, and over-the-counter pain medicines to manage your pain.  Exercises to stretch your calves and your plantar fascia.  A splint that holds your foot in a stretched, upward position while you sleep (night splint).  Physical therapy to relieve symptoms and prevent problems in the  future.  Cortisone injections to relieve severe pain.  Extracorporeal shock wave therapy (ESWT) to stimulate damaged plantar fascia with electrical impulses. It is often used as a last resort before surgery.  Surgery, if other treatments have not worked after 12 months. HOME CARE INSTRUCTIONS  Take medicines only as directed by your health care provider.  Avoid activities that cause pain.  Roll the bottom of your foot over a bag of ice or a bottle of cold water. Do this for 20 minutes, 3-4 times a day.  Perform simple stretches as directed by your health care provider.  Try wearing athletic shoes with air-sole or gel-sole cushions or soft shoe inserts.  Wear a night splint while sleeping, if directed by your health care provider.  Keep all follow-up appointments with your health care provider. PREVENTION   Do not perform exercises or activities that cause heel pain.  Consider finding low-impact activities if you continue to have problems.  Lose weight if you need to. The best way to prevent plantar fasciitis is to avoid the activities that aggravate your plantar fascia. SEEK MEDICAL CARE IF:  Your symptoms do not go away after treatment with home care measures.  Your pain gets worse.  Your pain affects your ability to move or do your daily activities.   This information is not intended to replace advice given to you by your health care provider. Make sure you discuss any questions you have with your health care provider.   Document Released: 10/23/2000 Document Revised: 10/19/2014 Document Reviewed: 12/08/2013 Elsevier   Interactive Patient Education 2016 [employer].  

## 2015-11-08 NOTE — Progress Notes (Signed)
Left foot and ankle pain  Feels like it aches.   Wants more information on flu shot from DR. Patient states she skeptical because her daughter received a flu shot and been sick for 2 weeks.   Patients states her gait is becoming unsteady And hands start to shake.  Would like new glucose monitor, strips and lancets

## 2015-11-09 ENCOUNTER — Telehealth: Payer: Self-pay

## 2015-11-09 LAB — TSH: TSH: 2.32 mIU/L

## 2015-11-09 NOTE — Addendum Note (Signed)
Addended by: Arnoldo Morale on: 11/09/2015 11:19 AM   Modules accepted: Orders

## 2015-11-09 NOTE — Telephone Encounter (Signed)
-----   Message from Arnoldo Morale, MD sent at 11/09/2015 11:28 AM EDT ----- Please inform the patient that labs are normal. Thank you.

## 2015-11-09 NOTE — Telephone Encounter (Signed)
Writer called patient with lab results today.  Patient was not available, I was able to leave a phone number with the person who answered the phone.

## 2015-11-13 ENCOUNTER — Telehealth: Payer: Self-pay

## 2015-11-13 ENCOUNTER — Telehealth: Payer: Self-pay | Admitting: Internal Medicine

## 2015-11-13 NOTE — Telephone Encounter (Signed)
Writer called patient and LVM asking her to return the call to discuss her test results.

## 2015-11-13 NOTE — Telephone Encounter (Signed)
-----   Message from Arnoldo Morale, MD sent at 11/09/2015 11:28 AM EDT ----- Please inform the patient that labs are normal. Thank you.

## 2015-11-13 NOTE — Telephone Encounter (Signed)
Patient is aware of normal lab resutls

## 2015-11-14 ENCOUNTER — Telehealth: Payer: Self-pay

## 2015-11-14 NOTE — Telephone Encounter (Signed)
-----   Message from Arnoldo Morale, MD sent at 11/09/2015 11:28 AM EDT ----- Please inform the patient that labs are normal. Thank you.

## 2015-11-14 NOTE — Telephone Encounter (Signed)
Writer called patient per Dr. Jarold Song to discuss her lab results.  Patient stated understanding and also stated that she had called back and a receptionist had given her the "normal lab results" news.

## 2015-12-08 MED FILL — ?LEVOTHYROXINE 137 MCG TAB: 137 | 30 days supply | Qty: 30 | Fill #0

## 2015-12-08 MED FILL — TRUE METRIX BLOOD GLUCOSE M: W/DEVICE | 1 days supply | Qty: 1 | Fill #0

## 2015-12-08 MED FILL — TRUEplus LANCETS 28G MISC: 25 days supply | Qty: 100 | Fill #0

## 2015-12-08 MED FILL — ?ATORVASTATIN 20 MG TABLET: 20 | 30 days supply | Qty: 30 | Fill #0

## 2015-12-08 MED FILL — TRUE METRIX TEST STRIP: 100 days supply | Qty: 100 | Fill #0

## 2015-12-08 MED FILL — LISINOPRIL-HCTZ 20-25 MG TA: 20-25 | 30 days supply | Qty: 30 | Fill #0

## 2016-02-13 MED FILL — LEVOTHYROXINE 137 MCG TAB: 137 | 30 days supply | Qty: 30 | Fill #1

## 2016-02-13 MED FILL — LISINOPRIL-HCTZ 20-25 MG TA: 20-25 | 30 days supply | Qty: 30 | Fill #1

## 2016-02-21 ENCOUNTER — Other Ambulatory Visit: Payer: Self-pay | Admitting: Internal Medicine

## 2016-02-21 ENCOUNTER — Ambulatory Visit: Payer: No Typology Code available for payment source | Attending: Family Medicine | Admitting: Family Medicine

## 2016-02-21 ENCOUNTER — Encounter: Payer: Self-pay | Admitting: Family Medicine

## 2016-02-21 VITALS — BP 140/84 | HR 83 | Temp 98.1°F | Ht 65.0 in | Wt 267.6 lb

## 2016-02-21 DIAGNOSIS — Z90710 Acquired absence of both cervix and uterus: Secondary | ICD-10-CM | POA: Insufficient documentation

## 2016-02-21 DIAGNOSIS — Z5189 Encounter for other specified aftercare: Secondary | ICD-10-CM | POA: Insufficient documentation

## 2016-02-21 DIAGNOSIS — Z79899 Other long term (current) drug therapy: Secondary | ICD-10-CM | POA: Insufficient documentation

## 2016-02-21 DIAGNOSIS — Z9851 Tubal ligation status: Secondary | ICD-10-CM | POA: Insufficient documentation

## 2016-02-21 DIAGNOSIS — L304 Erythema intertrigo: Secondary | ICD-10-CM

## 2016-02-21 DIAGNOSIS — I10 Essential (primary) hypertension: Secondary | ICD-10-CM

## 2016-02-21 DIAGNOSIS — Z114 Encounter for screening for human immunodeficiency virus [HIV]: Secondary | ICD-10-CM

## 2016-02-21 DIAGNOSIS — Z1211 Encounter for screening for malignant neoplasm of colon: Secondary | ICD-10-CM

## 2016-02-21 DIAGNOSIS — Z7982 Long term (current) use of aspirin: Secondary | ICD-10-CM | POA: Insufficient documentation

## 2016-02-21 DIAGNOSIS — E038 Other specified hypothyroidism: Secondary | ICD-10-CM | POA: Insufficient documentation

## 2016-02-21 DIAGNOSIS — Z9889 Other specified postprocedural states: Secondary | ICD-10-CM | POA: Insufficient documentation

## 2016-02-21 DIAGNOSIS — E119 Type 2 diabetes mellitus without complications: Secondary | ICD-10-CM | POA: Insufficient documentation

## 2016-02-21 LAB — COMPLETE METABOLIC PANEL WITH GFR
ALT: 17 U/L (ref 6–29)
AST: 19 U/L (ref 10–35)
Albumin: 3.6 g/dL (ref 3.6–5.1)
Alkaline Phosphatase: 65 U/L (ref 33–130)
BILIRUBIN TOTAL: 0.3 mg/dL (ref 0.2–1.2)
BUN: 9 mg/dL (ref 7–25)
CALCIUM: 9 mg/dL (ref 8.6–10.4)
CHLORIDE: 103 mmol/L (ref 98–110)
CO2: 31 mmol/L (ref 20–31)
CREATININE: 0.99 mg/dL (ref 0.50–0.99)
GFR, EST AFRICAN AMERICAN: 71 mL/min (ref >=60)
GFR, Est Non African American: 61 mL/min (ref >=60)
Glucose, Bld: 125 mg/dL — ABNORMAL HIGH (ref 65–99)
Potassium: 4.6 mmol/L (ref 3.5–5.3)
Sodium: 142 mmol/L (ref 135–146)
TOTAL PROTEIN: 6.9 g/dL (ref 6.1–8.1)

## 2016-02-21 LAB — GLUCOSE, POCT (MANUAL RESULT ENTRY): POC GLUCOSE: 118 mg/dL — AB (ref 70–99)

## 2016-02-21 LAB — POCT GLYCOSYLATED HEMOGLOBIN (HGB A1C): Hemoglobin A1C: 6.9

## 2016-02-21 LAB — HIV ANTIBODY (ROUTINE TESTING W REFLEX): HIV 1&2 Ab, 4th Generation: NONREACTIVE

## 2016-02-21 MED ORDER — LISINOPRIL-HYDROCHLOROTHIAZIDE 20-25 MG PO TABS: 1.0000 | ORAL_TABLET | Freq: Every morning | ORAL | 5 refills | Status: DC

## 2016-02-21 MED ORDER — LEVOTHYROXINE SODIUM 137 MCG PO TABS: 137.0000 ug | ORAL_TABLET | Freq: Every day | ORAL | 5 refills | Status: DC

## 2016-02-21 MED ORDER — CLOTRIMAZOLE 1 % EX CREA: 1.0000 "application " | TOPICAL_CREAM | Freq: Two times a day (BID) | CUTANEOUS | 1 refills | Status: DC

## 2016-02-21 NOTE — Progress Notes (Signed)
69 

## 2016-02-21 NOTE — Progress Notes (Signed)
Subjective:  [name], female    DOB: 11-21-1955  Age: 62 y.o. MRN: [id number]  CC: Diabetes; Hypertension; congested; and Rash (abdominal and upper legs)   HPI Tiffany Strickland is a 61 year old female with a history of hypertension, type 2 diabetes (diet controlled with A1c of 6.9), hypothyroidism here for a follow-up visit.  Diabetes is diet controlled and she has not been able to exercise as she would love to. Denies numbness in extremities, no visual complaints or hypoglycemia. Placed on atorvastatin due to cardiovascular benefits but she has not been taking it as she says "if it is not broke why fix it"  She has been out of her medications for the last 1 month.  Complains of a rash in the medial aspect of her thighs and lower abdomen.  Past Medical History:  Diagnosis Date  . Colon polyp    adenomatous  . Diabetes mellitus without complication ([place])   . Hyperlipidemia   . Hypertension   . Stroke ([place])   . Thyroid disease     Past Surgical History:  Procedure Laterality Date  . ABDOMINAL HYSTERECTOMY    . BLADDER SUSPENSION    . EUS N/A 08/12/2013   Procedure: LOWER ENDOSCOPIC ULTRASOUND (EUS);  Surgeon: Milus Banister, MD;  Location: [facility];  Service: Endoscopy;  Laterality: N/A;  . KNEE SURGERY    . TUBAL LIGATION      Allergies  Allergen Reactions  . Pollen Extract Other (See Comments)    Reaction unknown  . Tomato Other (See Comments)    Reaction unknown  . Wool Alcohol [Lanolin] Other (See Comments)    Reaction unknown     Outpatient Medications Prior to Visit  Medication Sig Dispense Refill  . aspirin (ASPIR-81) 81 MG EC tablet Take 1 tablet (81 mg total) by mouth daily. Swallow whole. 30 tablet 12  . levothyroxine (SYNTHROID, LEVOTHROID) 137 MCG tablet Take 1 tablet (137 mcg total) by mouth daily before breakfast. 30 tablet 5  . lisinopril-hydrochlorothiazide (PRINZIDE,ZESTORETIC) 20-25 MG tablet Take 1 tablet by mouth every  morning. 30 tablet 5  . atorvastatin (LIPITOR) 20 MG tablet Take 1 tablet (20 mg total) by mouth daily. (Patient not taking: Reported on 02/21/2016) 30 tablet 5  . Blood Glucose Monitoring Suppl (TRUE METRIX METER) DEVI 1 each by Does not apply route daily before breakfast. (Patient not taking: Reported on 02/21/2016) 1 Device 0  . glucose blood (TRUE METRIX BLOOD GLUCOSE TEST) test strip Use 1 time daily before breakfast (Patient not taking: Reported on 02/21/2016) 30 each 12  . polyethylene glycol (MIRALAX / GLYCOLAX) packet Take 17 g by mouth daily. (Patient not taking: Reported on 02/21/2016) 100 each 3  . TRUEPLUS LANCETS 28G MISC 1 each by Does not apply route daily before breakfast. (Patient not taking: Reported on 02/21/2016) 30 each 12  . clotrimazole (LOTRIMIN) 1 % cream Apply 1 application topically 2 (two) times daily. (Patient not taking: Reported on 02/21/2016) 45 g 1   No facility-administered medications prior to visit.     ROS Review of Systems Constitutional: Negative for activity change, appetite change and fatigue.  HENT: Negative for congestion, sinus pressure and sore throat.   Eyes: Negative for visual disturbance.  Respiratory: Negative for cough, chest tightness, shortness of breath and wheezing.   Cardiovascular: Negative for chest pain and palpitations.  Gastrointestinal: Negative for abdominal distention, abdominal pain and constipation.  Endocrine: Negative for polydipsia.  Genitourinary: Negative for dysuria and frequency.  Musculoskeletal:  See hpi  Skin: Positive for rash.  Neurological:  Negative for light-headedness and numbness.  Hematological: Does not bruise/bleed easily.  Psychiatric/Behavioral: Negative for agitation and behavioral problems.   Objective:  BP 140/84 (BP Location: Right Arm, Patient Position: Sitting, Cuff Size: Large)   Pulse 83   Temp 98.1 F (36.7 C) (Oral)   Ht 5\' 5"  (1.651 m)   Wt 267 lb 9.6 oz (121.4 kg)   SpO2 96%   BMI  44.53 kg/m   BP/Weight 02/21/2016 11/08/2015 99991111  Systolic BP [id number] 98 [id number]  Diastolic BP 84 67 75  Wt. (Lbs) 267.6 263.6 -  BMI 44.53 43.87 -      Physical Exam  General: Obese Cardiovascular: Normal rate, normal heart sounds and intact distal pulses.   No murmur heard. Pulmonary/Chest: Effort normal and breath sounds normal. She has no wheezes. She has no rales. She exhibits no tenderness.  Abdominal: Soft. Bowel sounds are normal. She exhibits no distension and no mass. There is no tenderness.  Musculoskeletal: Normal range of motion.  Neurological: She is alert and oriented to person, place, and time. No cranial nerve deficit. Coordination normal.  Normal gait  Skin:  Intertrigo rash beneath both breasts and between thighs  and inferior abdominal wall Psych: normal   Lab Results  Component Value Date   HGBA1C 6.9 02/21/2016    CMP Latest Ref Rng & Units 11/08/2015 12/05/2014 01/31/2014  Glucose 65 - 99 mg/dL 77 103(H) 78  BUN 7 - 25 mg/dL 20 6 9   Creatinine 0.50 - 0.99 mg/dL 0.88 0.81 0.68  Sodium 135 - 146 mmol/L 140 140 142  Potassium 3.5 - 5.3 mmol/L 4.3 3.7 4.6  Chloride 98 - 110 mmol/L 102 100(L) 101  CO2 20 - 31 mmol/L 31 31 33(H)  Calcium 8.6 - 10.4 mg/dL 9.1 9.6 9.6  Total Protein 6.1 - 8.1 g/dL 6.8 - 6.8  Total Bilirubin 0.2 - 1.2 mg/dL 0.4 - 0.4  Alkaline Phos 33 - 130 U/L 57 - 72  AST 10 - 35 U/L 13 - 15  ALT 6 - 29 U/L 13 - 20    Lipid Panel     Component Value Date/Time   CHOL 151 11/08/2015 1210   TRIG 66 11/08/2015 1210   HDL 49 11/08/2015 1210   CHOLHDL 3.1 11/08/2015 1210   VLDL 13 11/08/2015 1210   LDLCALC 89 11/08/2015 1210    Assessment & Plan:   1. Type 2 diabetes mellitus without complication, without long-term current use of insulin (HCC) Diet controlled with A1c of 6.9 Lifestyle modification Advised to schedule annual eye exam with an ophthalmologist Patient refuses to take statin despite discussing cardiovascular benefit -  Glucose (CBG) - HgB A1c  2. Intertrigo Weight Loss will help - clotrimazole (LOTRIMIN) 1 % cream; Apply 1 application topically 2 (two) times daily.  Dispense: 45 g; Refill: 1  3. Essential hypertension controlled - lisinopril-hydrochlorothiazide (PRINZIDE,ZESTORETIC) 20-25 MG tablet; Take 1 tablet by mouth every morning.  Dispense: 30 tablet; Refill: 5 - COMPLETE METABOLIC PANEL WITH GFR  4. Other specified hypothyroidism Will hold off on labs as she has been off meds x1 month - levothyroxine (SYNTHROID, LEVOTHROID) 137 MCG tablet; Take 1 tablet (137 mcg total) by mouth daily before breakfast.  Dispense: 30 tablet; Refill: 5  5. Screening for HIV (human immunodeficiency virus) - HIV antibody   Meds ordered this encounter  Medications  . clotrimazole (LOTRIMIN) 1 % cream    Sig: Apply 1 application topically  2 (two) times daily.    Dispense:  45 g    Refill:  1  . lisinopril-hydrochlorothiazide (PRINZIDE,ZESTORETIC) 20-25 MG tablet    Sig: Take 1 tablet by mouth every morning.    Dispense:  30 tablet    Refill:  5  . levothyroxine (SYNTHROID, LEVOTHROID) 137 MCG tablet    Sig: Take 1 tablet (137 mcg total) by mouth daily before breakfast.    Dispense:  30 tablet    Refill:  5    Follow-up: Return in about 3 months (around 05/21/2016) for follow up on Hypothyroidism and Diabetes.   Arnoldo Morale MD

## 2016-03-05 MED FILL — POLYETHYLENE GLYCOL 3350 PO: 30 days supply | Qty: 510 | Fill #0

## 2016-03-05 MED FILL — CLOTRIMAZOLE 1% CREAM: 1 | 37 days supply | Qty: 57 | Fill #0

## 2016-03-27 ENCOUNTER — Ambulatory Visit: Payer: Self-pay | Attending: Family Medicine

## 2016-03-27 ENCOUNTER — Ambulatory Visit: Payer: No Typology Code available for payment source

## 2016-03-27 ENCOUNTER — Encounter: Payer: Self-pay | Admitting: Family Medicine

## 2016-03-27 ENCOUNTER — Ambulatory Visit (HOSPITAL_BASED_OUTPATIENT_CLINIC_OR_DEPARTMENT_OTHER): Payer: Self-pay | Admitting: Family Medicine

## 2016-03-27 VITALS — BP 142/84 | HR 68 | Temp 97.8°F | Wt 266.0 lb

## 2016-03-27 DIAGNOSIS — Z8673 Personal history of transient ischemic attack (TIA), and cerebral infarction without residual deficits: Secondary | ICD-10-CM | POA: Insufficient documentation

## 2016-03-27 DIAGNOSIS — M79641 Pain in right hand: Secondary | ICD-10-CM | POA: Insufficient documentation

## 2016-03-27 DIAGNOSIS — E039 Hypothyroidism, unspecified: Secondary | ICD-10-CM | POA: Insufficient documentation

## 2016-03-27 DIAGNOSIS — Z79899 Other long term (current) drug therapy: Secondary | ICD-10-CM | POA: Insufficient documentation

## 2016-03-27 DIAGNOSIS — I10 Essential (primary) hypertension: Secondary | ICD-10-CM | POA: Insufficient documentation

## 2016-03-27 DIAGNOSIS — E119 Type 2 diabetes mellitus without complications: Secondary | ICD-10-CM | POA: Insufficient documentation

## 2016-03-27 DIAGNOSIS — Z7982 Long term (current) use of aspirin: Secondary | ICD-10-CM | POA: Insufficient documentation

## 2016-03-27 DIAGNOSIS — E785 Hyperlipidemia, unspecified: Secondary | ICD-10-CM | POA: Insufficient documentation

## 2016-03-27 NOTE — Progress Notes (Signed)
Subjective:  [name], female    DOB: 12-09-1955  Age: 61 y.o. MRN: [id number]  CC: Burn (right hand )   HPI Tiffany Strickland with a history of hypertension, type 2 diabetes (diet controlled with A1c of 6.9), hypothyroidism here Complaining of intermittent pain in the dorsum of her right hand and intermittent swelling ever since she spilled hot water on her right hand one month ago. Denies sustaining any burning but feels like her "tendons are tightening" whenever she makes a fist Left hand is normal; she is right-handed.  Past Medical History:  Diagnosis Date  . Colon polyp    adenomatous  . Diabetes mellitus without complication ([place])   . Hyperlipidemia   . Hypertension   . Stroke ([place])   . Thyroid disease     Past Surgical History:  Procedure Laterality Date  . ABDOMINAL HYSTERECTOMY    . BLADDER SUSPENSION    . EUS N/A 08/12/2013   Procedure: LOWER ENDOSCOPIC ULTRASOUND (EUS);  Surgeon: Milus Banister, MD;  Location: [facility];  Service: Endoscopy;  Laterality: N/A;  . KNEE SURGERY    . TUBAL LIGATION       Allergies  Allergen Reactions  . Pollen Extract Other (See Comments)    Reaction unknown  . Tomato Other (See Comments)    Reaction unknown  . Wool Alcohol [Lanolin] Other (See Comments)    Reaction unknown     Outpatient Medications Prior to Visit  Medication Sig Dispense Refill  . aspirin (ASPIR-81) 81 MG EC tablet Take 1 tablet (81 mg total) by mouth daily. Swallow whole. 30 tablet 12  . Blood Glucose Monitoring Suppl (TRUE METRIX METER) DEVI 1 each by Does not apply route daily before breakfast. 1 Device 0  . clotrimazole (LOTRIMIN) 1 % cream Apply 1 application topically 2 (two) times daily. 45 g 1  . glucose blood (TRUE METRIX BLOOD GLUCOSE TEST) test strip Use 1 time daily before breakfast 30 each 12  . levothyroxine (SYNTHROID, LEVOTHROID) 137 MCG tablet Take 1 tablet (137 mcg total) by mouth daily before breakfast. 30 tablet 5  .  lisinopril-hydrochlorothiazide (PRINZIDE,ZESTORETIC) 20-25 MG tablet Take 1 tablet by mouth every morning. 30 tablet 5  . polyethylene glycol powder (GLYCOLAX/MIRALAX) powder TAKE 17 GRAMS WITH BY MOUTH DAILY. 510 g 0  . TRUEPLUS LANCETS 28G MISC 1 each by Does not apply route daily before breakfast. 30 each 12  . atorvastatin (LIPITOR) 20 MG tablet Take 1 tablet (20 mg total) by mouth daily. (Patient not taking: Reported on 02/21/2016) 30 tablet 5   No facility-administered medications prior to visit.     ROS Review of Systems  Constitutional: Negative for activity change and appetite change.  HENT: Negative for sinus pressure and sore throat.   Respiratory: Negative for chest tightness, shortness of breath and wheezing.   Cardiovascular: Negative for chest pain and palpitations.  Gastrointestinal: Negative for abdominal distention, abdominal pain and constipation.  Genitourinary: Negative.   Musculoskeletal:       See hpi  Psychiatric/Behavioral: Negative for behavioral problems and dysphoric mood.    Objective:  BP (!) 142/84   Pulse 68   Temp 97.8 F (36.6 C) (Oral)   Wt 266 lb (120.7 kg)   SpO2 99%   BMI 44.26 kg/m   BP/Weight 03/27/2016 02/21/2016 [id number]  Systolic BP [id number] 98  Diastolic BP 84 84 67  Wt. (Lbs) 266 267.6 263.6  BMI 44.26 44.53 43.87      Physical  Exam  Constitutional: She is oriented to person, place, and time. She appears well-developed and well-nourished.  Cardiovascular: Normal rate, normal heart sounds and intact distal pulses.   No murmur heard. Pulmonary/Chest: Effort normal and breath sounds normal. She has no wheezes. She has no rales. She exhibits no tenderness.  Abdominal: Soft. Bowel sounds are normal. She exhibits no distension and no mass. There is no tenderness.  Musculoskeletal: She exhibits no edema or tenderness.  Normal appearing skin in dorsum of both pains Normal range of motion in both hands  Neurological: She is alert and  oriented to person, place, and time.     Assessment & Plan:   1. Right hand pain No evidence of scalding of the skin of dorsum of the hand Treat for underlying tendinopathy She does have ibuprofen 800 mg at home and has been advised to take this twice daily as needed She is refusing a short course of oral prednisone.   No orders of the defined types were placed in this encounter.   Follow-up: Return in about 2 months (around 05/25/2016) for follow up on chronic medical conditions.   Arnoldo Morale MD

## 2016-04-02 ENCOUNTER — Other Ambulatory Visit: Payer: Self-pay | Admitting: Family Medicine

## 2016-04-02 DIAGNOSIS — Z1211 Encounter for screening for malignant neoplasm of colon: Secondary | ICD-10-CM

## 2016-04-02 MED FILL — LISINOPRIL-HCTZ 20-25 MG TA: 20-25 | 30 days supply | Qty: 30 | Fill #2

## 2016-04-02 MED FILL — POLYETHYLENE GLYCOL 3350 PO: 30 days supply | Qty: 510 | Fill #0

## 2016-04-02 MED FILL — LEVOTHYROXINE 137 MCG TAB: 137 | 30 days supply | Qty: 30 | Fill #2

## 2016-05-16 MED FILL — LEVOTHYROXINE 137 MCG TAB: 137 | 30 days supply | Qty: 30 | Fill #3 | Status: TO

## 2016-05-16 MED FILL — LISINOPRIL-HCTZ 20-25 MG TA: 20-25 | 30 days supply | Qty: 30 | Fill #3 | Status: TO

## 2016-05-20 ENCOUNTER — Other Ambulatory Visit: Payer: Self-pay | Admitting: Family Medicine

## 2016-05-20 ENCOUNTER — Other Ambulatory Visit: Payer: Self-pay | Admitting: Internal Medicine

## 2016-05-20 DIAGNOSIS — Z1231 Encounter for screening mammogram for malignant neoplasm of breast: Secondary | ICD-10-CM

## 2016-05-24 ENCOUNTER — Ambulatory Visit: Payer: Self-pay | Admitting: Family Medicine

## 2016-06-25 ENCOUNTER — Other Ambulatory Visit: Payer: Self-pay | Admitting: Obstetrics and Gynecology

## 2016-06-25 DIAGNOSIS — Z1231 Encounter for screening mammogram for malignant neoplasm of breast: Secondary | ICD-10-CM

## 2016-07-11 ENCOUNTER — Ambulatory Visit (HOSPITAL_COMMUNITY)
Admission: RE | Admit: 2016-07-11 | Discharge: 2016-07-11 | Disposition: A | Discharge status: Home / Self Care | Payer: Self-pay | Source: Ambulatory Visit | Attending: Obstetrics and Gynecology | Admitting: Obstetrics and Gynecology

## 2016-07-11 ENCOUNTER — Ambulatory Visit
Admission: RE | Admit: 2016-07-11 | Discharge: 2016-07-11 | Disposition: A | Discharge status: Home / Self Care | Payer: No Typology Code available for payment source | Source: Ambulatory Visit | Attending: Obstetrics and Gynecology | Admitting: Obstetrics and Gynecology

## 2016-07-11 ENCOUNTER — Encounter (HOSPITAL_COMMUNITY): Payer: Self-pay

## 2016-07-11 VITALS — BP 126/80 | Ht 65.0 in | Wt 267.8 lb

## 2016-07-11 DIAGNOSIS — Z1239 Encounter for other screening for malignant neoplasm of breast: Secondary | ICD-10-CM

## 2016-07-11 DIAGNOSIS — Z1231 Encounter for screening mammogram for malignant neoplasm of breast: Secondary | ICD-10-CM

## 2016-07-11 NOTE — Progress Notes (Signed)
Complaints of sharp left breast pain x 2-3 years that comes and goes. Patient rates the pain at a 8.5-9.   Pap Smear: Pap smear not completed today. Last Pap smear was 01/31/2009 and normal. Per patient has no history of an abnormal Pap smear. Patient has a history of a hysterectomy due to cystocele in 2004. Patient no longer needs Pap smears due to her history of a hysterectomy for benign reasons. Last Pap smear result is in EPIC.  Physical exam: Breasts Breasts symmetrical. No skin abnormalities bilateral breasts. No nipple retraction bilateral breasts. No nipple discharge bilateral breasts. No lymphadenopathy. No lumps palpated bilateral breasts. Complaints of mild left outer breast tenderness on exam. Referred patient to the [facility] for a screening mammogram. Appointment scheduled for Thursday, Jul 11, 2016 at 1440.        Pelvic/Bimanual No Pap smear completed today since patient has a history of a hysterectomy for benign reasons. Pap smear not indicated per B[facility]CP guidelines.   Smoking History: Patient has never smoked.  Patient Navigation: Patient education provided. Access to services provided for patient through [facility] program.   Colorectal Cancer Screening: Patient had a colonoscopy completed 08/12/2013. No complaints today. FIT Test given to patient to complete and return to B[facility]CP.

## 2016-07-11 NOTE — Patient Instructions (Signed)
Explained breast self awareness with [name]. Patient did not need a Pap smear today due to patient has a history of a hysterectomy for benign reasons. Let patient know that she doesn't need any further Pap smears due to her history of a hysterectomy for benign reasons. Referred patient to the [facility] for a screening mammogram. Appointment scheduled for Thursday, Jul 11, 2016 at 1440. Let [facility] know [facility] will [facility] up with her within the next couple weeks with results of mammogram by letter or phone. [name] verbalized understanding.  Marquis Diles, Arvil Chaco, RN 12:55 PM

## 2016-07-12 ENCOUNTER — Encounter (HOSPITAL_COMMUNITY): Payer: Self-pay | Admitting: *Deleted

## 2016-09-26 ENCOUNTER — Ambulatory Visit: Payer: Self-pay | Attending: Family Medicine | Admitting: Family Medicine

## 2016-09-26 ENCOUNTER — Encounter: Payer: Self-pay | Admitting: Family Medicine

## 2016-09-26 VITALS — BP 145/85 | HR 68 | Temp 97.9°F | Wt 273.0 lb

## 2016-09-26 DIAGNOSIS — E1165 Type 2 diabetes mellitus with hyperglycemia: Secondary | ICD-10-CM | POA: Insufficient documentation

## 2016-09-26 DIAGNOSIS — E7849 Other hyperlipidemia: Secondary | ICD-10-CM

## 2016-09-26 DIAGNOSIS — K5909 Other constipation: Secondary | ICD-10-CM | POA: Insufficient documentation

## 2016-09-26 DIAGNOSIS — E038 Other specified hypothyroidism: Secondary | ICD-10-CM | POA: Insufficient documentation

## 2016-09-26 DIAGNOSIS — R059 Cough, unspecified: Secondary | ICD-10-CM

## 2016-09-26 DIAGNOSIS — R05 Cough: Secondary | ICD-10-CM | POA: Insufficient documentation

## 2016-09-26 DIAGNOSIS — Z8673 Personal history of transient ischemic attack (TIA), and cerebral infarction without residual deficits: Secondary | ICD-10-CM | POA: Insufficient documentation

## 2016-09-26 DIAGNOSIS — E785 Hyperlipidemia, unspecified: Secondary | ICD-10-CM | POA: Insufficient documentation

## 2016-09-26 DIAGNOSIS — E784 Other hyperlipidemia: Secondary | ICD-10-CM

## 2016-09-26 DIAGNOSIS — I10 Essential (primary) hypertension: Secondary | ICD-10-CM | POA: Insufficient documentation

## 2016-09-26 DIAGNOSIS — R252 Cramp and spasm: Secondary | ICD-10-CM | POA: Insufficient documentation

## 2016-09-26 DIAGNOSIS — Z7982 Long term (current) use of aspirin: Secondary | ICD-10-CM | POA: Insufficient documentation

## 2016-09-26 DIAGNOSIS — Z79899 Other long term (current) drug therapy: Secondary | ICD-10-CM | POA: Insufficient documentation

## 2016-09-26 DIAGNOSIS — E119 Type 2 diabetes mellitus without complications: Secondary | ICD-10-CM

## 2016-09-26 LAB — POCT GLYCOSYLATED HEMOGLOBIN (HGB A1C): HEMOGLOBIN A1C: 7.9

## 2016-09-26 LAB — GLUCOSE, POCT (MANUAL RESULT ENTRY): POC Glucose: 116 mg/dl — AB (ref 70–99)

## 2016-09-26 MED ORDER — POLYETHYLENE GLYCOL 3350 17 GM/SCOOP PO POWD: ORAL | 3 refills | Status: DC

## 2016-09-26 MED ORDER — LISINOPRIL-HYDROCHLOROTHIAZIDE 20-25 MG PO TABS
1.0000 | ORAL_TABLET | Freq: Every morning | ORAL | 5 refills | Status: DC
Start: 2016-09-26 — End: 2017-01-27

## 2016-09-26 MED ORDER — METFORMIN HCL 500 MG PO TABS: 1000.0000 mg | ORAL_TABLET | Freq: Two times a day (BID) | ORAL | 3 refills | Status: DC

## 2016-09-26 MED ORDER — LEVOTHYROXINE SODIUM 137 MCG PO TABS: 137.0000 ug | ORAL_TABLET | Freq: Every day | ORAL | 5 refills | Status: DC

## 2016-09-26 MED ORDER — CETIRIZINE HCL 10 MG PO TABS: 10.0000 mg | ORAL_TABLET | Freq: Every day | ORAL | 2 refills | Status: DC

## 2016-09-26 NOTE — Progress Notes (Signed)
Subjective:  [name], female    DOB: 06-29-1955  Age: 61 y.o. MRN: [id number]  CC:   HPI Tiffany Strickland is a 61 year old female with a history of hypertension, type 2 diabetes (diet controlled with A1c of 7.9 which is up from 6.9), hypothyroidism here for a follow-up visit.  Diabetes was previously diet controlled but now her A1c has trended up to 7.9 from 6.9 previously. She is reluctant to commencing medications Denies numbness in extremities, no visual complaints or hypoglycemia. Placed on atorvastatin due to cardiovascular benefits but she has not been taking it as she says "if it is not broke why fix it"  She complains of cough for the last 1 month which is dry but also endorses postnasal drip.  She has had 2 episodes of cramping in her right arm but then does not want any muscle relaxants and states she will work on her diet.  She has been out of her medications for the last 1 month.  Past Medical History:  Diagnosis Date  . Colon polyp    adenomatous  . Diabetes mellitus without complication ([place])   . Hyperlipidemia   . Hypertension   . Stroke ([place])   . Thyroid disease     Past Surgical History:  Procedure Laterality Date  . ABDOMINAL HYSTERECTOMY    . BLADDER SUSPENSION    . EUS N/A 08/12/2013   Procedure: LOWER ENDOSCOPIC ULTRASOUND (EUS);  Surgeon: Milus Banister, MD;  Location: [facility];  Service: Endoscopy;  Laterality: N/A;  . KNEE SURGERY    . TUBAL LIGATION      Allergies  Allergen Reactions  . Pollen Extract Other (See Comments)    Reaction unknown  . Tomato Other (See Comments)    Reaction unknown  . Wool Alcohol [Lanolin] Other (See Comments)    Reaction unknown     Outpatient Medications Prior to Visit  Medication Sig Dispense Refill  . aspirin (ASPIR-81) 81 MG EC tablet Take 1 tablet (81 mg total) by mouth daily. Swallow whole. 30 tablet 12  . Blood Glucose Monitoring Suppl (TRUE METRIX METER) DEVI 1 each by Does not  apply route daily before breakfast. 1 Device 0  . clotrimazole (LOTRIMIN) 1 % cream Apply 1 application topically 2 (two) times daily. (Patient not taking: Reported on 07/11/2016) 45 g 1  . glucose blood (TRUE METRIX BLOOD GLUCOSE TEST) test strip Use 1 time daily before breakfast 30 each 12  . TRUEPLUS LANCETS 28G MISC 1 each by Does not apply route daily before breakfast. 30 each 12  . atorvastatin (LIPITOR) 20 MG tablet Take 1 tablet (20 mg total) by mouth daily. (Patient not taking: Reported on 02/21/2016) 30 tablet 5  . levothyroxine (SYNTHROID, LEVOTHROID) 137 MCG tablet Take 1 tablet (137 mcg total) by mouth daily before breakfast. 30 tablet 5  . lisinopril-hydrochlorothiazide (PRINZIDE,ZESTORETIC) 20-25 MG tablet Take 1 tablet by mouth every morning. 30 tablet 5  . polyethylene glycol powder (GLYCOLAX/MIRALAX) powder TAKE 17 GRAMS WITH BY MOUTH DAILY. (Patient not taking: Reported on 07/11/2016) 510 g 0   No facility-administered medications prior to visit.     ROS Review of Systems  Constitutional: Negative for activity change, appetite change and fatigue.  HENT: Negative for congestion, sinus pressure and sore throat.   Eyes: Negative for visual disturbance.  Respiratory: Positive for cough. Negative for chest tightness, shortness of breath and wheezing.   Cardiovascular: Negative for chest pain and palpitations.  Gastrointestinal: Negative for abdominal distention,  abdominal pain and constipation.  Endocrine: Negative for polydipsia.  Genitourinary: Negative for dysuria and frequency.  Musculoskeletal: Negative for arthralgias and back pain.  Skin: Negative for rash.  Neurological: Negative for tremors, light-headedness and numbness.  Hematological: Does not bruise/bleed easily.  Psychiatric/Behavioral: Negative for agitation and behavioral problems.    Objective:  BP (!) 145/85   Pulse 68   Temp 97.9 F (36.6 C) (Oral)   Wt 273 lb (123.8 kg)   SpO2 98%   BMI 45.43 kg/m     BP/Weight 09/26/2016 07/11/2016 08/23/4578  Systolic BP 998 338 250  Diastolic BP 85 80 84  Wt. (Lbs) 273 267.8 266  BMI 45.43 44.56 44.26      Physical Exam  Constitutional: She is oriented to person, place, and time. She appears well-developed and well-nourished.  Cardiovascular: Normal rate, normal heart sounds and intact distal pulses.   No murmur heard. Pulmonary/Chest: Effort normal and breath sounds normal. She has no wheezes. She has no rales. She exhibits no tenderness.  Abdominal: Soft. Bowel sounds are normal. She exhibits no distension and no mass. There is no tenderness.  Musculoskeletal: Normal range of motion.  Neurological: She is alert and oriented to person, place, and time.  Skin: Skin is warm and dry.  Psychiatric: She has a normal mood and affect.    Lab Results  Component Value Date   HGBA1C 7.9 09/26/2016    Assessment & Plan:   1. Type 2 diabetes mellitus without complication, without long-term current use of insulin (HCC) Uncontrolled with A1c of 7.9 which Has trended up from 6.9 previously Commence metformin Diabetic diet Discussed the need to be on statin due to added cardiovascular benefit which she declines  Keep blood sugar logs with fasting goals of 80-120 mg/dl, random of less than 180 and in the event of sugars less than 60 mg/dl or greater than 400 mg/dl please notify the clinic ASAP. It is recommended that you undergo annual eye exams and annual foot exams. Pneumovax is recommended every 5 years before the age of 35 and once for a lifetime at or after the age of 38. - POCT glucose (manual entry) - POCT glycosylated hemoglobin (Hb A1C) - metFORMIN (GLUCOPHAGE) 500 MG tablet; Take 2 tablets (1,000 mg total) by mouth 2 (two) times daily with a meal.  Dispense: 120 tablet; Refill: 3 - Microalbumin/Creatinine Ratio, Urine; Future  2. Essential hypertension Uncontrolled due to running out of medications Refill medications Low-sodium diet -  lisinopril-hydrochlorothiazide (PRINZIDE,ZESTORETIC) 20-25 MG tablet; Take 1 tablet by mouth every morning.  Dispense: 30 tablet; Refill: 5 - CMP14+EGFR; Future - Lipid panel; Future   3. Other specified hypothyroidism We'll hold off on thyroid panel today as she has been out of her medications - levothyroxine (SYNTHROID, LEVOTHROID) 137 MCG tablet; Take 1 tablet (137 mcg total) by mouth daily before breakfast.  Dispense: 30 tablet; Refill: 5 - TSH; Future  4. Other constipation Increase fiber - polyethylene glycol powder (GLYCOLAX/MIRALAX) powder; TAKE 17 GRAMS WITH BY MOUTH DAILY.  Dispense: 500 g; Refill: 3  5. Cough Likely from postnasal drip - cetirizine (ZYRTEC) 10 MG tablet; Take 1 tablet (10 mg total) by mouth daily.  Dispense: 30 tablet; Refill: 2   Meds ordered this encounter  Medications  . lisinopril-hydrochlorothiazide (PRINZIDE,ZESTORETIC) 20-25 MG tablet    Sig: Take 1 tablet by mouth every morning.    Dispense:  30 tablet    Refill:  5  . levothyroxine (SYNTHROID, LEVOTHROID) 137 MCG tablet  Sig: Take 1 tablet (137 mcg total) by mouth daily before breakfast.    Dispense:  30 tablet    Refill:  5  . polyethylene glycol powder (GLYCOLAX/MIRALAX) powder    Sig: TAKE 17 GRAMS WITH BY MOUTH DAILY.    Dispense:  500 g    Refill:  3  . metFORMIN (GLUCOPHAGE) 500 MG tablet    Sig: Take 2 tablets (1,000 mg total) by mouth 2 (two) times daily with a meal.    Dispense:  120 tablet    Refill:  3  . cetirizine (ZYRTEC) 10 MG tablet    Sig: Take 1 tablet (10 mg total) by mouth daily.    Dispense:  30 tablet    Refill:  2    Follow-up: Return in about 4 months (around 01/26/2017) for chronic medical conditions.   This note has been created with Surveyor, quantity. Any transcriptional errors are unintentional.     Arnoldo Morale MD

## 2016-09-26 NOTE — Patient Instructions (Signed)

## 2016-09-27 ENCOUNTER — Other Ambulatory Visit: Payer: Self-pay

## 2016-10-15 ENCOUNTER — Telehealth (HOSPITAL_COMMUNITY): Payer: Self-pay

## 2016-10-15 NOTE — Telephone Encounter (Signed)
Spoke with the patient to remind her about the at home FIT test that was given to the patient in [place] on 07/11/2016. The patient wasn't sure where she had put the test. I let her know that I would mail her one. She said she would complete the test and she would mail back.

## 2017-01-17 ENCOUNTER — Ambulatory Visit: Payer: Self-pay | Attending: Family Medicine

## 2017-01-22 ENCOUNTER — Ambulatory Visit: Payer: Self-pay

## 2017-01-27 ENCOUNTER — Encounter: Payer: Self-pay | Admitting: Family Medicine

## 2017-01-27 ENCOUNTER — Ambulatory Visit: Payer: Self-pay | Attending: Family Medicine | Admitting: Family Medicine

## 2017-01-27 VITALS — BP 175/102 | HR 81 | Temp 97.7°F | Ht 65.0 in | Wt 279.8 lb

## 2017-01-27 DIAGNOSIS — Z86010 Personal history of colonic polyps: Secondary | ICD-10-CM | POA: Insufficient documentation

## 2017-01-27 DIAGNOSIS — Z23 Encounter for immunization: Secondary | ICD-10-CM | POA: Insufficient documentation

## 2017-01-27 DIAGNOSIS — E119 Type 2 diabetes mellitus without complications: Secondary | ICD-10-CM | POA: Insufficient documentation

## 2017-01-27 DIAGNOSIS — Z7982 Long term (current) use of aspirin: Secondary | ICD-10-CM | POA: Insufficient documentation

## 2017-01-27 DIAGNOSIS — I10 Essential (primary) hypertension: Secondary | ICD-10-CM | POA: Insufficient documentation

## 2017-01-27 DIAGNOSIS — Z8673 Personal history of transient ischemic attack (TIA), and cerebral infarction without residual deficits: Secondary | ICD-10-CM | POA: Insufficient documentation

## 2017-01-27 DIAGNOSIS — Z6841 Body Mass Index (BMI) 40.0 and over, adult: Secondary | ICD-10-CM | POA: Insufficient documentation

## 2017-01-27 DIAGNOSIS — E66813 Obesity, class 3: Secondary | ICD-10-CM | POA: Insufficient documentation

## 2017-01-27 DIAGNOSIS — Z79890 Hormone replacement therapy (postmenopausal): Secondary | ICD-10-CM | POA: Insufficient documentation

## 2017-01-27 DIAGNOSIS — Z90710 Acquired absence of both cervix and uterus: Secondary | ICD-10-CM | POA: Insufficient documentation

## 2017-01-27 DIAGNOSIS — Z79899 Other long term (current) drug therapy: Secondary | ICD-10-CM | POA: Insufficient documentation

## 2017-01-27 DIAGNOSIS — E785 Hyperlipidemia, unspecified: Secondary | ICD-10-CM | POA: Insufficient documentation

## 2017-01-27 DIAGNOSIS — E038 Other specified hypothyroidism: Secondary | ICD-10-CM | POA: Insufficient documentation

## 2017-01-27 LAB — POCT GLYCOSYLATED HEMOGLOBIN (HGB A1C): Hemoglobin A1C: 7.9

## 2017-01-27 LAB — GLUCOSE, POCT (MANUAL RESULT ENTRY): POC Glucose: 157 mg/dl — AB (ref 70–99)

## 2017-01-27 MED ORDER — LOSARTAN POTASSIUM-HCTZ 100-25 MG PO TABS: 1.0000 | ORAL_TABLET | Freq: Every day | ORAL | 6 refills | Status: DC

## 2017-01-27 MED FILL — LOSARTAN-HCTZ 100-25 MG TAB: 100-25 | 30 days supply | Qty: 30 | Fill #0

## 2017-01-27 MED FILL — LEVOTHYROXINE 137 MCG TAB: 137 | 30 days supply | Qty: 30 | Fill #0 | Status: TO

## 2017-01-27 NOTE — Progress Notes (Signed)
Subjective:  [name], female    DOB: 11-15-1955  Age: 61 y.o. MRN: [id number]  CC: Diabetes   HPI Tiffany Strickland is a 61 year old female with a history of hypertension, type 2 diabetes (diet controlled with A1c of 7.9 which is up from 6.9), hypothyroidism here for a follow-up visit.  She was prescribed metformin for diabetes which she refuses to take and was also prescribed atorvastatin which she declines to take as well despite discussing with her the consequences of her decisions.  She has been compliant with her levothyroxine for hypothyroidism.  Her blood pressure is elevated and she informs me the pharmacist held onto her lisinopril/hydrochlorothiazide due to the fact that she had complained of a cough because he said he it would be 'on him if he gave it to her' and she had no other antihypertensive to take.  She has gained 12 pounds in the last 7 months and attributes this to sitting around as a result of losing 5 family members.  She denies being depressed and declines any treatment or therapy.  Past Medical History:  Diagnosis Date  . Colon polyp    adenomatous  . Diabetes mellitus without complication ([place])   . Hyperlipidemia   . Hypertension   . Stroke ([place])   . Thyroid disease     Past Surgical History:  Procedure Laterality Date  . ABDOMINAL HYSTERECTOMY    . BLADDER SUSPENSION    . EUS N/A 08/12/2013   Procedure: LOWER ENDOSCOPIC ULTRASOUND (EUS);  Surgeon: Milus Banister, MD;  Location: [facility];  Service: Endoscopy;  Laterality: N/A;  . KNEE SURGERY    . TUBAL LIGATION      Allergies  Allergen Reactions  . Pollen Extract Other (See Comments)    Reaction unknown  . Tomato Other (See Comments)    Reaction unknown  . Wool Alcohol [Lanolin] Other (See Comments)    Reaction unknown     Outpatient Medications Prior to Visit  Medication Sig Dispense Refill  . aspirin (ASPIR-81) 81 MG EC tablet Take 1 tablet (81 mg total) by mouth  daily. Swallow whole. 30 tablet 12  . levothyroxine (SYNTHROID, LEVOTHROID) 137 MCG tablet Take 1 tablet (137 mcg total) by mouth daily before breakfast. 30 tablet 5  . Blood Glucose Monitoring Suppl (TRUE METRIX METER) DEVI 1 each by Does not apply route daily before breakfast. (Patient not taking: Reported on 01/27/2017) 1 Device 0  . cetirizine (ZYRTEC) 10 MG tablet Take 1 tablet (10 mg total) by mouth daily. (Patient not taking: Reported on 01/27/2017) 30 tablet 2  . clotrimazole (LOTRIMIN) 1 % cream Apply 1 application topically 2 (two) times daily. (Patient not taking: Reported on 07/11/2016) 45 g 1  . glucose blood (TRUE METRIX BLOOD GLUCOSE TEST) test strip Use 1 time daily before breakfast (Patient not taking: Reported on 01/27/2017) 30 each 12  . metFORMIN (GLUCOPHAGE) 500 MG tablet Take 2 tablets (1,000 mg total) by mouth 2 (two) times daily with a meal. (Patient not taking: Reported on 01/27/2017) 120 tablet 3  . polyethylene glycol powder (GLYCOLAX/MIRALAX) powder TAKE 17 GRAMS WITH BY MOUTH DAILY. (Patient not taking: Reported on 01/27/2017) 500 g 3  . TRUEPLUS LANCETS 28G MISC 1 each by Does not apply route daily before breakfast. (Patient not taking: Reported on 01/27/2017) 30 each 12  . lisinopril-hydrochlorothiazide (PRINZIDE,ZESTORETIC) 20-25 MG tablet Take 1 tablet by mouth every morning. (Patient not taking: Reported on 01/27/2017) 30 tablet 5   No facility-administered  medications prior to visit.     ROS Review of Systems  Constitutional: Negative for activity change, appetite change and fatigue.  HENT: Negative for congestion, sinus pressure and sore throat.   Eyes: Negative for visual disturbance.  Respiratory: Negative for cough, chest tightness, shortness of breath and wheezing.   Cardiovascular: Negative for chest pain and palpitations.  Gastrointestinal: Negative for abdominal distention, abdominal pain and constipation.  Endocrine: Negative for polydipsia.    Genitourinary: Negative for dysuria and frequency.  Musculoskeletal: Negative for arthralgias and back pain.  Skin: Negative for rash.  Neurological: Negative for tremors, light-headedness and numbness.  Hematological: Does not bruise/bleed easily.  Psychiatric/Behavioral: Negative for agitation and behavioral problems.    Objective:  BP (!) 175/102   Pulse 81   Temp 97.7 F (36.5 C) (Oral)   Ht _0  (1.651 m)   Wt 279 lb 12.8 oz (126.9 kg)   SpO2 95%   BMI 46.56 kg/m   BP/Weight 01/27/2017 09/26/2016 9/79/4801  Systolic BP 655 374 827  Diastolic BP 078 85 80  Wt. (Lbs) 279.8 273 267.8  BMI 46.56 45.43 44.56      Physical Exam  Constitutional: She is oriented to person, place, and time. She appears well-developed and well-nourished.  Cardiovascular: Normal rate, normal heart sounds and intact distal pulses.  No murmur heard. Pulmonary/Chest: Effort normal and breath sounds normal. She has no wheezes. She has no rales. She exhibits no tenderness.  Abdominal: Soft. Bowel sounds are normal. She exhibits no distension and no mass. There is no tenderness.  Musculoskeletal: Normal range of motion.  Neurological: She is alert and oriented to person, place, and time.  Skin: Skin is warm and dry.  Psychiatric: She has a normal mood and affect.     Lab Results  Component Value Date   HGBA1C 7.9 01/27/2017   Lipid Panel     Component Value Date/Time   CHOL 151 11/08/2015 1210   TRIG 66 11/08/2015 1210   HDL 49 11/08/2015 1210   CHOLHDL 3.1 11/08/2015 1210   VLDL 13 11/08/2015 1210   LDLCALC 89 11/08/2015 1210    The 10-year ASCVD risk score Mikey Bussing DC Jr., et al., 2013) is: 31.2%   Values used to calculate the score:     Age: 39 years     Sex: Female     Is Non-Hispanic African American: Yes     Diabetic: Yes     Tobacco smoker: No     Systolic Blood Pressure: 675 mmHg     Is BP treated: Yes     HDL Cholesterol: 49 mg/dL     Total Cholesterol: 151  mg/dL   Assessment & Plan:   1. Type 2 diabetes mellitus without complication, without long-term current use of insulin (HCC) Controlled with A1c of 7.9 She continues to decline initiating metformin also declines statin for primary prevention of ASCVD -risk is 31.2% I have discussed with her implications of her decision including complications of diabetes Diabetic diet, lifestyle modifications - POCT glucose (manual entry) - POCT glycosylated hemoglobin (Hb A1C) - CMP14+EGFR - Lipid panel - Microalbumin/Creatinine Ratio, Urine  2. Other specified hypothyroidism Controlled next We will refill levothyroxine once TSH is obtained - T4, free - TSH  3. Essential hypertension Uncontrolled as she has not been taking her lisinopril/hydrochlorothiazide Substituted with losartan/hydrochlorothiazide Low-sodium, DASH diet, lifestyle modifications - losartan-hydrochlorothiazide (HYZAAR) 100-25 MG tablet; Take 1 tablet by mouth daily.  Dispense: 30 tablet; Refill: 6  4. Need for influenza  vaccination - Flu Vaccine QUAD 36+ mos IM  5. Class 3 severe obesity due to excess calories without serious comorbidity with body mass index (BMI) of 45.0 to 49.9 in adult [facility]) She has gained 12 pounds in the last 7 months Discussed limiting portion sizes, restricting caloric intake to 1500 cal/day 150 minutes of active exercise per week Avoid late meals   Meds ordered this encounter  Medications  . losartan-hydrochlorothiazide (HYZAAR) 100-25 MG tablet    Sig: Take 1 tablet by mouth daily.    Dispense:  30 tablet    Refill:  6    Follow-up: Follow-up of chronic medical conditions  Arnoldo Morale MD

## 2017-01-27 NOTE — Patient Instructions (Signed)
Diabetes Mellitus and Food It is important for you to manage your blood sugar (glucose) level. Your blood glucose level can be greatly affected by what you eat. Eating healthier foods in the appropriate amounts throughout the day at about the same time each day will help you control your blood glucose level. It can also help slow or prevent worsening of your diabetes mellitus. Healthy eating may even help you improve the level of your blood pressure and reach or maintain a healthy weight. General recommendations for healthful eating and cooking habits include:  Eating meals and snacks regularly. Avoid going long periods of time without eating to lose weight.  Eating a diet that consists mainly of plant-based foods, such as fruits, vegetables, nuts, legumes, and whole grains.  Using low-heat cooking methods, such as baking, instead of high-heat cooking methods, such as deep frying.  Work with your dietitian to make sure you understand how to use the Nutrition Facts information on food labels. How can food affect me? Carbohydrates Carbohydrates affect your blood glucose level more than any other type of food. Your dietitian will help you determine how many carbohydrates to eat at each meal and teach you how to count carbohydrates. Counting carbohydrates is important to keep your blood glucose at a healthy level, especially if you are using insulin or taking certain medicines for diabetes mellitus. Alcohol Alcohol can cause sudden decreases in blood glucose (hypoglycemia), especially if you use insulin or take certain medicines for diabetes mellitus. Hypoglycemia can be a life-threatening condition. Symptoms of hypoglycemia (sleepiness, dizziness, and disorientation) are similar to symptoms of having too much alcohol. If your health care provider has given you approval to drink alcohol, do so in moderation and use the following guidelines:  Women should not have more than one drink per day, and men  should not have more than two drinks per day. One drink is equal to: ? 12 oz of beer. ? 5 oz of wine. ? 1 oz of hard liquor.  Do not drink on an empty stomach.  Keep yourself hydrated. Have water, diet soda, or unsweetened iced tea.  Regular soda, juice, and other mixers might contain a lot of carbohydrates and should be counted.  What foods are not recommended? As you make food choices, it is important to remember that all foods are not the same. Some foods have fewer nutrients per serving than other foods, even though they might have the same number of calories or carbohydrates. It is difficult to get your body what it needs when you eat foods with fewer nutrients. Examples of foods that you should avoid that are high in calories and carbohydrates but low in nutrients include:  Trans fats (most processed foods list trans fats on the Nutrition Facts label).  Regular soda.  Juice.  Candy.  Sweets, such as cake, pie, doughnuts, and cookies.  Fried foods.  What foods can I eat? Eat nutrient-rich foods, which will nourish your body and keep you healthy. The food you should eat also will depend on several factors, including:  The calories you need.  The medicines you take.  Your weight.  Your blood glucose level.  Your blood pressure level.  Your cholesterol level.  You should eat a variety of foods, including:  Protein. ? Lean cuts of meat. ? Proteins low in saturated fats, such as fish, egg whites, and beans. Avoid processed meats.  Fruits and vegetables. ? Fruits and vegetables that may help control blood glucose levels, such as apples,   mangoes, and yams.  Dairy products. ? Choose fat-free or low-fat dairy products, such as milk, yogurt, and cheese.  Grains, bread, pasta, and rice. ? Choose whole grain products, such as multigrain bread, whole oats, and brown rice. These foods may help control blood pressure.  Fats. ? Foods containing healthful fats, such as  nuts, avocado, olive oil, canola oil, and fish.  Does everyone with diabetes mellitus have the same meal plan? Because every person with diabetes mellitus is different, there is not one meal plan that works for everyone. It is very important that you meet with a dietitian who will help you create a meal plan that is just right for you. This information is not intended to replace advice given to you by your health care provider. Make sure you discuss any questions you have with your health care provider. Document Released: 10/25/2004 Document Revised: 07/06/2015 Document Reviewed: 12/25/2012 Elsevier Interactive Patient Education  2017 [employer]  

## 2017-01-28 ENCOUNTER — Other Ambulatory Visit: Payer: Self-pay | Admitting: Family Medicine

## 2017-01-28 DIAGNOSIS — E038 Other specified hypothyroidism: Secondary | ICD-10-CM

## 2017-01-28 LAB — LIPID PANEL
CHOLESTEROL TOTAL: 159 mg/dL (ref 100–199)
Chol/HDL Ratio: 3.2 ratio (ref 0.0–4.4)
HDL: 50 mg/dL (ref >39)
LDL Calculated: 93 mg/dL (ref 0–99)
TRIGLYCERIDES: 80 mg/dL (ref 0–149)
VLDL Cholesterol Cal: 16 mg/dL (ref 5–40)

## 2017-01-28 LAB — CMP14+EGFR
ALBUMIN: 3.8 g/dL (ref 3.6–4.8)
ALK PHOS: 89 IU/L (ref 39–117)
ALT: 18 IU/L (ref 0–32)
AST: 15 IU/L (ref 0–40)
Albumin/Globulin Ratio: 1.3 (ref 1.2–2.2)
BILIRUBIN TOTAL: 0.3 mg/dL (ref 0.0–1.2)
BUN/Creatinine Ratio: 11 — ABNORMAL LOW (ref 12–28)
BUN: 8 mg/dL (ref 8–27)
CHLORIDE: 100 mmol/L (ref 96–106)
CO2: 29 mmol/L (ref 20–29)
CREATININE: 0.76 mg/dL (ref 0.57–1.00)
Calcium: 8.9 mg/dL (ref 8.7–10.3)
GFR calc Af Amer: 97 mL/min/{1.73_m2} (ref >59)
GFR calc non Af Amer: 84 mL/min/{1.73_m2} (ref >59)
GLUCOSE: 139 mg/dL — AB (ref 65–99)
Globulin, Total: 2.9 g/dL (ref 1.5–4.5)
Potassium: 4 mmol/L (ref 3.5–5.2)
Sodium: 140 mmol/L (ref 134–144)
TOTAL PROTEIN: 6.7 g/dL (ref 6.0–8.5)

## 2017-01-28 LAB — MICROALBUMIN / CREATININE URINE RATIO
Creatinine, Urine: 42.6 mg/dL
MICROALB/CREAT RATIO: 222.1 mg/g{creat} — AB (ref 0.0–30.0)
Microalbumin, Urine: 94.6 ug/mL

## 2017-01-28 LAB — TSH: TSH: 5.36 u[IU]/mL — ABNORMAL HIGH (ref 0.450–4.500)

## 2017-01-28 LAB — T4, FREE: FREE T4: 1.36 ng/dL (ref 0.82–1.77)

## 2017-01-28 MED ORDER — LEVOTHYROXINE SODIUM 150 MCG PO TABS: 137.0000 ug | ORAL_TABLET | Freq: Every day | ORAL | 3 refills | Status: DC

## 2017-02-02 ENCOUNTER — Emergency Department (HOSPITAL_COMMUNITY): Payer: Self-pay

## 2017-02-02 ENCOUNTER — Encounter (HOSPITAL_COMMUNITY): Payer: Self-pay | Admitting: Emergency Medicine

## 2017-02-02 ENCOUNTER — Observation Stay (HOSPITAL_COMMUNITY)
Admission: EM | Admit: 2017-02-02 | Discharge: 2017-02-03 | Disposition: A | Discharge status: Home / Self Care | Payer: Self-pay | Attending: Nephrology | Admitting: Nephrology

## 2017-02-02 DIAGNOSIS — I371 Nonrheumatic pulmonary valve insufficiency: Secondary | ICD-10-CM | POA: Insufficient documentation

## 2017-02-02 DIAGNOSIS — E119 Type 2 diabetes mellitus without complications: Secondary | ICD-10-CM

## 2017-02-02 DIAGNOSIS — Z9109 Other allergy status, other than to drugs and biological substances: Secondary | ICD-10-CM | POA: Insufficient documentation

## 2017-02-02 DIAGNOSIS — G4733 Obstructive sleep apnea (adult) (pediatric): Secondary | ICD-10-CM

## 2017-02-02 DIAGNOSIS — Z6841 Body Mass Index (BMI) 40.0 and over, adult: Secondary | ICD-10-CM | POA: Insufficient documentation

## 2017-02-02 DIAGNOSIS — Z8489 Family history of other specified conditions: Secondary | ICD-10-CM | POA: Insufficient documentation

## 2017-02-02 DIAGNOSIS — I10 Essential (primary) hypertension: Secondary | ICD-10-CM | POA: Insufficient documentation

## 2017-02-02 DIAGNOSIS — I2721 Secondary pulmonary arterial hypertension: Secondary | ICD-10-CM | POA: Insufficient documentation

## 2017-02-02 DIAGNOSIS — Z90710 Acquired absence of both cervix and uterus: Secondary | ICD-10-CM | POA: Insufficient documentation

## 2017-02-02 DIAGNOSIS — Z8041 Family history of malignant neoplasm of ovary: Secondary | ICD-10-CM | POA: Insufficient documentation

## 2017-02-02 DIAGNOSIS — Z836 Family history of other diseases of the respiratory system: Secondary | ICD-10-CM | POA: Insufficient documentation

## 2017-02-02 DIAGNOSIS — E785 Hyperlipidemia, unspecified: Secondary | ICD-10-CM | POA: Insufficient documentation

## 2017-02-02 DIAGNOSIS — Z91018 Allergy to other foods: Secondary | ICD-10-CM | POA: Insufficient documentation

## 2017-02-02 DIAGNOSIS — Z8249 Family history of ischemic heart disease and other diseases of the circulatory system: Secondary | ICD-10-CM | POA: Insufficient documentation

## 2017-02-02 DIAGNOSIS — Z8673 Personal history of transient ischemic attack (TIA), and cerebral infarction without residual deficits: Secondary | ICD-10-CM | POA: Insufficient documentation

## 2017-02-02 DIAGNOSIS — Z79899 Other long term (current) drug therapy: Secondary | ICD-10-CM | POA: Insufficient documentation

## 2017-02-02 DIAGNOSIS — Z7982 Long term (current) use of aspirin: Secondary | ICD-10-CM | POA: Insufficient documentation

## 2017-02-02 DIAGNOSIS — Z809 Family history of malignant neoplasm, unspecified: Secondary | ICD-10-CM | POA: Insufficient documentation

## 2017-02-02 DIAGNOSIS — Z794 Long term (current) use of insulin: Secondary | ICD-10-CM | POA: Insufficient documentation

## 2017-02-02 DIAGNOSIS — M722 Plantar fascial fibromatosis: Secondary | ICD-10-CM | POA: Insufficient documentation

## 2017-02-02 DIAGNOSIS — E039 Hypothyroidism, unspecified: Secondary | ICD-10-CM | POA: Diagnosis present

## 2017-02-02 DIAGNOSIS — Z86010 Personal history of colonic polyps: Secondary | ICD-10-CM | POA: Insufficient documentation

## 2017-02-02 DIAGNOSIS — R079 Chest pain, unspecified: Principal | ICD-10-CM | POA: Diagnosis present

## 2017-02-02 LAB — URINALYSIS, ROUTINE W REFLEX MICROSCOPIC
BILIRUBIN URINE: NEGATIVE
Glucose, UA: NEGATIVE mg/dL
HGB URINE DIPSTICK: NEGATIVE
Ketones, ur: 5 mg/dL — AB
Leukocytes, UA: NEGATIVE
Nitrite: NEGATIVE
PH: 7 (ref 5.0–8.0)
Protein, ur: NEGATIVE mg/dL
SPECIFIC GRAVITY, URINE: 1.009 (ref 1.005–1.030)

## 2017-02-02 LAB — CBC
HCT: 42.4 % (ref 36.0–46.0)
HEMATOCRIT: 39.3 % (ref 36.0–46.0)
HEMOGLOBIN: 12.7 g/dL (ref 12.0–15.0)
Hemoglobin: 13.2 g/dL (ref 12.0–15.0)
MCH: 29.3 pg (ref 26.0–34.0)
MCH: 30.1 pg (ref 26.0–34.0)
MCHC: 31.1 g/dL (ref 30.0–36.0)
MCHC: 32.3 g/dL (ref 30.0–36.0)
MCV: 94 fL (ref 78.0–100.0)
MCV: 93.1 fL (ref 78.0–100.0)
PLATELETS: 312 10*3/uL (ref 150–400)
Platelets: 306 10*3/uL (ref 150–400)
RBC: 4.51 MIL/uL (ref 3.87–5.11)
RBC: 4.22 MIL/uL (ref 3.87–5.11)
RDW: 14.1 % (ref 11.5–15.5)
RDW: 14.1 % (ref 11.5–15.5)
WBC: 7.7 10*3/uL (ref 4.0–10.5)
WBC: 6.9 10*3/uL (ref 4.0–10.5)

## 2017-02-02 LAB — D-DIMER, QUANTITATIVE (NOT AT ARMC): D DIMER QUANT: 0.97 ug{FEU}/mL — AB (ref 0.00–0.50)

## 2017-02-02 LAB — BASIC METABOLIC PANEL
Anion gap: 7 (ref 5–15)
BUN: 12 mg/dL (ref 6–20)
CALCIUM: 9.2 mg/dL (ref 8.9–10.3)
CHLORIDE: 102 mmol/L (ref 101–111)
CO2: 29 mmol/L (ref 22–32)
CREATININE: 0.82 mg/dL (ref 0.44–1.00)
GFR calc Af Amer: >60 mL/min (ref >60)
GFR calc non Af Amer: >60 mL/min (ref >60)
Glucose, Bld: 125 mg/dL — ABNORMAL HIGH (ref 65–99)
Potassium: 3.7 mmol/L (ref 3.5–5.1)
SODIUM: 138 mmol/L (ref 135–145)

## 2017-02-02 LAB — TROPONIN I: Troponin I: <0.03 ng/mL (ref <0.03)

## 2017-02-02 LAB — BRAIN NATRIURETIC PEPTIDE: B Natriuretic Peptide: 20.7 pg/mL (ref 0.0–100.0)

## 2017-02-02 LAB — CREATININE, SERUM
Creatinine, Ser: 0.65 mg/dL (ref 0.44–1.00)
GFR calc non Af Amer: >60 mL/min (ref >60)

## 2017-02-02 LAB — I-STAT TROPONIN, ED: TROPONIN I, POC: 0 ng/mL (ref 0.00–0.08)

## 2017-02-02 LAB — CBG MONITORING, ED: Glucose-Capillary: 168 mg/dL — ABNORMAL HIGH (ref 65–99)

## 2017-02-02 MED ORDER — IOPAMIDOL (ISOVUE-370) INJECTION 76%
INTRAVENOUS | Status: AC
  Administered 2017-02-02: 100 mL via INTRAVENOUS
  Filled 2017-02-02: qty 100

## 2017-02-02 MED ORDER — ONDANSETRON HCL 4 MG/2ML IJ SOLN: 4.0000 mg | Freq: Four times a day (QID) | INTRAMUSCULAR | Status: DC | PRN

## 2017-02-02 MED ORDER — ASPIRIN 81 MG PO CHEW
324.0000 mg | CHEWABLE_TABLET | Freq: Once | ORAL | Status: AC
  Administered 2017-02-02: 324 mg via ORAL
  Filled 2017-02-02: qty 4

## 2017-02-02 MED ORDER — ACETAMINOPHEN 325 MG PO TABS: 650.0000 mg | ORAL_TABLET | ORAL | Status: DC | PRN

## 2017-02-02 MED ORDER — LEVOTHYROXINE SODIUM 137 MCG PO TABS: 137.0000 ug | ORAL_TABLET | Freq: Every day | ORAL | Status: DC

## 2017-02-02 MED ORDER — INSULIN ASPART 100 UNIT/ML ~~LOC~~ SOLN
0.0000 [IU] | Freq: Three times a day (TID) | SUBCUTANEOUS | Status: DC
  Administered 2017-02-03: 1 [IU] via SUBCUTANEOUS

## 2017-02-02 MED ORDER — LOSARTAN POTASSIUM-HCTZ 100-25 MG PO TABS: 1.0000 | ORAL_TABLET | Freq: Every day | ORAL | Status: DC

## 2017-02-02 MED ORDER — ENOXAPARIN SODIUM 40 MG/0.4ML ~~LOC~~ SOLN
40.0000 mg | Freq: Every day | SUBCUTANEOUS | Status: DC
  Administered 2017-02-03: 40 mg via SUBCUTANEOUS
  Filled 2017-02-02: qty 0.4

## 2017-02-02 MED ORDER — ASPIRIN EC 325 MG PO TBEC
325.0000 mg | DELAYED_RELEASE_TABLET | Freq: Every day | ORAL | Status: DC
  Administered 2017-02-03: 325 mg via ORAL
  Filled 2017-02-02: qty 1

## 2017-02-02 MED ORDER — HYDROCHLOROTHIAZIDE 12.5 MG PO CAPS
25.0000 mg | ORAL_CAPSULE | Freq: Once | ORAL | Status: AC
  Administered 2017-02-02: 25 mg via ORAL
  Filled 2017-02-02: qty 2

## 2017-02-02 MED ORDER — LOSARTAN POTASSIUM 50 MG PO TABS
100.0000 mg | ORAL_TABLET | Freq: Every day | ORAL | Status: DC
  Administered 2017-02-03: 100 mg via ORAL
  Filled 2017-02-02: qty 2

## 2017-02-02 MED ORDER — LOSARTAN POTASSIUM 50 MG PO TABS
100.0000 mg | ORAL_TABLET | Freq: Once | ORAL | Status: AC
  Administered 2017-02-02: 100 mg via ORAL
  Filled 2017-02-02: qty 2

## 2017-02-02 MED ORDER — LEVOTHYROXINE SODIUM 25 MCG PO TABS
137.0000 ug | ORAL_TABLET | Freq: Every day | ORAL | Status: DC
  Administered 2017-02-03: 137 ug via ORAL
  Filled 2017-02-02: qty 1

## 2017-02-02 MED ORDER — HYDROCHLOROTHIAZIDE 12.5 MG PO CAPS: 25.0000 mg | ORAL_CAPSULE | Freq: Once | ORAL | Status: DC

## 2017-02-02 MED ORDER — HYDROCHLOROTHIAZIDE 25 MG PO TABS
25.0000 mg | ORAL_TABLET | Freq: Every day | ORAL | Status: DC
  Administered 2017-02-03: 25 mg via ORAL
  Filled 2017-02-02: qty 1

## 2017-02-02 MED ORDER — LOSARTAN POTASSIUM 50 MG PO TABS: 100.0000 mg | ORAL_TABLET | Freq: Once | ORAL | Status: DC

## 2017-02-02 MED ORDER — NITROGLYCERIN 0.4 MG SL SUBL
0.4000 mg | SUBLINGUAL_TABLET | SUBLINGUAL | Status: DC | PRN
  Filled 2017-02-02: qty 1

## 2017-02-02 NOTE — ED Triage Notes (Addendum)
Patient c/o intermittent central chest pressure and shortness of breath with exertion since this morning. Reports left eye pressure. Denies blurred vision, weakness, N/V/D.

## 2017-02-02 NOTE — ED Notes (Signed)
ED TO INPATIENT HANDOFF REPORT  Name/Age/Gender Tiffany Strickland 61 y.o. female  Code Status    Code Status Orders  (From admission, onward)        Start     Ordered   02/02/17 2152  Full code  Continuous     02/02/17 2152    Code Status History    Date Active Date Inactive Code Status Order ID Comments User Context   This patient has a current code status but no historical code status.      Home/SNF/Other Home  Chief Complaint chest pain   Level of Care/Admitting Diagnosis ED Disposition    ED Disposition Condition Comment   Admit  Hospital Area: [facility] [[id number]]  Level of Care: Telemetry [5]  Admit to tele based on following criteria: Monitor for Ischemic changes  Diagnosis: Chest pain [097353]  Admitting Physician: Rise Patience [[id number]]  Attending Physician: Rise Patience [phone]  PT Class (Do Not Modify): Observation [104]  PT Acc Code (Do Not Modify): Observation [10022]       Medical History Past Medical History:  Diagnosis Date  . Colon polyp    adenomatous  . Diabetes mellitus without complication ([place])   . Hyperlipidemia   . Hypertension   . Stroke ([place])   . Thyroid disease     Allergies Allergies  Allergen Reactions  . Pollen Extract Other (See Comments)    Reaction unknown  . Tomato Other (See Comments)    Reaction unknown  . Wool Alcohol [Lanolin] Other (See Comments)    Reaction unknown    IV Location/Drains/Wounds Patient Lines/Drains/Airways Status   Active Line/Drains/Airways    None          Labs/Imaging Results for orders placed or performed during the hospital encounter of 02/02/17 (from the past 48 hour(s))  Basic metabolic panel     Status: Abnormal   Collection Time: 02/02/17  5:46 PM  Result Value Ref Range   Sodium 138 135 - 145 mmol/L   Potassium 3.7 3.5 - 5.1 mmol/L   Chloride 102 101 - 111 mmol/L   CO2 29 22 - 32 mmol/L   Glucose, Bld 125 (H) 65 - 99 mg/dL   BUN 12 6 -  20 mg/dL   Creatinine, Ser 0.82 0.44 - 1.00 mg/dL   Calcium 9.2 8.9 - 10.3 mg/dL   GFR calc non Af Amer >60 >60 mL/min   GFR calc Af Amer >60 >60 mL/min    Comment: (NOTE) The eGFR has been calculated using the CKD EPI equation. This calculation has not been validated in all clinical situations. eGFR's persistently <60 mL/min signify possible Chronic Kidney Disease.    Anion gap 7 5 - 15  CBC     Status: None   Collection Time: 02/02/17  5:46 PM  Result Value Ref Range   WBC 7.7 4.0 - 10.5 K/uL   RBC 4.51 3.87 - 5.11 MIL/uL   Hemoglobin 13.2 12.0 - 15.0 g/dL   HCT 42.4 36.0 - 46.0 %   MCV 94.0 78.0 - 100.0 fL   MCH 29.3 26.0 - 34.0 pg   MCHC 31.1 30.0 - 36.0 g/dL   RDW 14.1 11.5 - 15.5 %   Platelets 312 150 - 400 K/uL  Brain natriuretic peptide     Status: None   Collection Time: 02/02/17  5:46 PM  Result Value Ref Range   B Natriuretic Peptide 20.7 0.0 - 100.0 pg/mL  I-stat troponin, ED  Status: None   Collection Time: 02/02/17  5:58 PM  Result Value Ref Range   Troponin i, poc 0.00 0.00 - 0.08 ng/mL   Comment 3            Comment: Due to the release kinetics of cTnI, a negative result within the first hours of the onset of symptoms does not rule out myocardial infarction with certainty. If myocardial infarction is still suspected, repeat the test at appropriate intervals.   Urinalysis, Routine w reflex microscopic     Status: Abnormal   Collection Time: 02/02/17  7:38 PM  Result Value Ref Range   Color, Urine STRAW (A) YELLOW   APPearance CLEAR CLEAR   Specific Gravity, Urine 1.009 1.005 - 1.030   pH 7.0 5.0 - 8.0   Glucose, UA NEGATIVE NEGATIVE mg/dL   Hgb urine dipstick NEGATIVE NEGATIVE   Bilirubin Urine NEGATIVE NEGATIVE   Ketones, ur 5 (A) NEGATIVE mg/dL   Protein, ur NEGATIVE NEGATIVE mg/dL   Nitrite NEGATIVE NEGATIVE   Leukocytes, UA NEGATIVE NEGATIVE  D-dimer, quantitative (not at [facility])     Status: Abnormal   Collection Time: 02/02/17  7:48 PM   Result Value Ref Range   D-Dimer, Quant 0.97 (H) 0.00 - 0.50 ug/mL-FEU    Comment: (NOTE) At the manufacturer cut-off of 0.50 ug/mL FEU, this assay has been documented to exclude PE with a sensitivity and negative predictive value of 97 to 99%.  At this time, this assay has not been approved by the FDA to exclude DVT/VTE. Results should be correlated with clinical presentation.    Dg Chest 2 View  Result Date: 02/02/2017 CLINICAL DATA:  Chest pain EXAM: CHEST  2 VIEW COMPARISON:  12/05/2014 FINDINGS: Mild cardiomegaly. No confluent opacities, effusions or overt edema. No acute bony abnormality. IMPRESSION: Cardiomegaly.  No active disease. Electronically Signed   By: Rolm Baptise M.D.   On: 02/02/2017 18:01   Ct Angio Chest Pe W And/or Wo Contrast  Result Date: 02/02/2017 CLINICAL DATA:  Intermittent central chest pressure and shortness of breath with exertion since this morning. EXAM: CT ANGIOGRAPHY CHEST WITH CONTRAST TECHNIQUE: Multidetector CT imaging of the chest was performed using the standard protocol during bolus administration of intravenous contrast. Multiplanar CT image reconstructions and MIPs were obtained to evaluate the vascular anatomy. CONTRAST:  <See Chart> ISOVUE-370 IOPAMIDOL (ISOVUE-370) INJECTION 76% COMPARISON:  Chest x-ray today. FINDINGS: Cardiovascular: Heart is normal in size thoracic aorta is within normal. There is prominence of the main pulmonary artery segment. No evidence pulmonary embolism. Mediastinum/Nodes: No mediastinal or hilar adenopathy. Remaining mediastinal structures are within normal. Lungs/Pleura: Lungs are adequately inflated without focal airspace consolidation or effusion. Airways are within normal. Upper Abdomen: Within normal. Musculoskeletal: Minimal degenerate change of the spine. Review of the MIP images confirms the above findings. IMPRESSION: No evidence pulmonary embolism.  No acute cardiopulmonary disease. Prominence of the main pulmonary  artery which may be seen with pulmonary arterial hypertension. Electronically Signed   By: Marin Olp M.D.   On: 02/02/2017 21:26    Pending Labs Unresulted Labs (From admission, onward)   Start     Ordered   02/09/17 0500  Creatinine, serum  (enoxaparin (LOVENOX)    CrCl >/= 30 ml/min)  Weekly,   R    Comments:  while on enoxaparin therapy    02/02/17 2152   02/02/17 2153  Troponin I (q 6hr x 3)  Now then every 6 hours,   R  02/02/17 2152   02/02/17 2152  CBC  (enoxaparin (LOVENOX)    CrCl >/= 30 ml/min)  Once,   R    Comments:  Baseline for enoxaparin therapy IF NOT ALREADY DRAWN.  Notify MD if PLT < 100 K.    02/02/17 2152   02/02/17 2152  Creatinine, serum  (enoxaparin (LOVENOX)    CrCl >/= 30 ml/min)  Once,   R    Comments:  Baseline for enoxaparin therapy IF NOT ALREADY DRAWN.    02/02/17 2152      Vitals/Pain Today's Vitals   02/02/17 1953 02/02/17 2000 02/02/17 2139 02/02/17 2140  BP: (!) 180/80 (!) 175/79 (!) 161/74   Pulse: 72 63  76  Resp: 13 (!) 21    Temp:      TempSrc:      SpO2: 100% 100%  100%    Isolation Precautions No active isolations  Medications Medications  nitroGLYCERIN (NITROSTAT) SL tablet 0.4 mg (not administered)  aspirin chewable tablet 324 mg (not administered)  levothyroxine (SYNTHROID, LEVOTHROID) tablet 137 mcg (not administered)  losartan-hydrochlorothiazide (HYZAAR) 100-25 MG per tablet 1 tablet (not administered)  acetaminophen (TYLENOL) tablet 650 mg (not administered)  ondansetron (ZOFRAN) injection 4 mg (not administered)  insulin aspart (novoLOG) injection 0-9 Units (not administered)  enoxaparin (LOVENOX) injection 40 mg (not administered)  aspirin EC tablet 325 mg (not administered)  losartan (COZAAR) tablet 100 mg (100 mg Oral Given 02/02/17 2136)  hydrochlorothiazide (MICROZIDE) capsule 25 mg (25 mg Oral Given 02/02/17 2136)  iopamidol (ISOVUE-370) 76 % injection (100 mLs Intravenous Contrast Given 02/02/17 2102)     Mobility walks

## 2017-02-02 NOTE — ED Provider Notes (Signed)
[facility] DEPT Provider Note   CSN: [id number] Arrival date & time: 02/02/17  1724     History   Chief Complaint Chief Complaint  Patient presents with  . Chest Pain    HPI Tiffany Strickland is a 61 y.o. female.  HPI   Tiffany Strickland is a 61 y.o. female, with a history of DM, HTN, hyperlipidemia, obesity, and stroke, presenting to the ED with chest pain beginning around 3:30PM today while riding on the bus. Pain is intermittent lasting for a few minutes at a time, central chest, pressure, 5/10, radiates to both arms.  Accompanied by pressure behind the left eye that resolved within a minute.  No vision changes. Does endorse some difficulty breathing over the last month with exertion. States she thinks it's been because of her recent weight gain over the past several months.  Bilateral lower extremity edema over the past several weeks.  Also endorses urinary frequency beginning today, but denies other urinary complaints.   States two months ago, the pharmacist refused to fill her lisinopril because she had a cough and that could be a side effect of the lisinopril. She has not had any HTN medication in at least two months. She saw her PCP on December 17 and was switched from the lisinopril to losartan/HCTZ combo.  She has not yet started this medication.  Denies N/V/D, cough, abdominal pain, fever/chills, headache, dizziness, or any other complaints.    Past Medical History:  Diagnosis Date  . Colon polyp    adenomatous  . Diabetes mellitus without complication ([place])   . Hyperlipidemia   . Hypertension   . Stroke ([place])   . Thyroid disease     Patient Active Problem List   Diagnosis Date Noted  . Hypothyroidism 02/02/2017  . Chest pain 02/02/2017  . Obesity 01/27/2017  . Plantar fasciitis of left foot 11/08/2015  . Type 2 diabetes mellitus without complication, without long-term current use of insulin ([place]) 06/08/2015  . Colon cancer  screening 06/08/2015  . Bilateral leg edema 12/15/2014  . Blurry vision, bilateral 06/16/2014  . Breast cancer screening 06/16/2014  . Other specified hypothyroidism 06/16/2014  . Other seasonal allergic rhinitis 06/16/2014  . History of stroke 01/31/2014  . Essential hypertension 08/24/2013  . Nonspecific (abnormal) findings on radiological and other examination of gastrointestinal tract 08/12/2013  . Lateral epicondylitis of right elbow 07/21/2013  . Right shoulder pain 07/21/2013  . Sprain of wrist, right 07/21/2013  . Unspecified constipation 04/15/2013  . Rectal bleeding 04/15/2013  . HTN (hypertension) 01/15/2013  . Diabetes ([place]) 08/10/2012    Past Surgical History:  Procedure Laterality Date  . ABDOMINAL HYSTERECTOMY    . BLADDER SUSPENSION    . EUS N/A 08/12/2013   Procedure: LOWER ENDOSCOPIC ULTRASOUND (EUS);  Surgeon: Milus Banister, MD;  Location: [facility];  Service: Endoscopy;  Laterality: N/A;  . KNEE SURGERY    . TUBAL LIGATION      OB History    Gravida Para Term Preterm AB Living   6 5 5   1 5    SAB TAB Ectopic Multiple Live Births   1               Home Medications    Prior to Admission medications   Medication Sig Start Date End Date Taking? Authorizing Provider  aspirin (ASPIR-81) 81 MG EC tablet Take 1 tablet (81 mg total) by mouth daily. Swallow whole. 11/08/15   Arnoldo Morale, MD  Blood Glucose  Monitoring Suppl (TRUE METRIX METER) DEVI 1 each by Does not apply route daily before breakfast. Patient not taking: Reported on 01/27/2017 11/08/15   Arnoldo Morale, MD  cetirizine (ZYRTEC) 10 MG tablet Take 1 tablet (10 mg total) by mouth daily. Patient not taking: Reported on 01/27/2017 09/26/16   Arnoldo Morale, MD  clotrimazole (LOTRIMIN) 1 % cream Apply 1 application topically 2 (two) times daily. Patient not taking: Reported on 07/11/2016 02/21/16   Arnoldo Morale, MD  glucose blood (TRUE METRIX BLOOD GLUCOSE TEST) test strip Use 1 time daily before  breakfast Patient not taking: Reported on 01/27/2017 11/08/15   Arnoldo Morale, MD  levothyroxine (SYNTHROID, LEVOTHROID) 150 MCG tablet Take 1 tablet (150 mcg total) by mouth daily before breakfast. 01/28/17   Arnoldo Morale, MD  losartan-hydrochlorothiazide (HYZAAR) 100-25 MG tablet Take 1 tablet by mouth daily. 01/27/17   Arnoldo Morale, MD  metFORMIN (GLUCOPHAGE) 500 MG tablet Take 2 tablets (1,000 mg total) by mouth 2 (two) times daily with a meal. Patient not taking: Reported on 01/27/2017 09/26/16   Arnoldo Morale, MD  polyethylene glycol powder (GLYCOLAX/MIRALAX) powder TAKE 17 GRAMS WITH BY MOUTH DAILY. Patient not taking: Reported on 01/27/2017 09/26/16   Arnoldo Morale, MD  TRUEPLUS LANCETS 28G MISC 1 each by Does not apply route daily before breakfast. Patient not taking: Reported on 01/27/2017 11/08/15   Arnoldo Morale, MD    Family History Family History  Problem Relation Age of Onset  . Hypertension Mother   . Heart disease Maternal Grandmother   . Cancer Maternal Grandmother        ovarian  . Cancer Maternal Grandfather   . Hypertension Other   . Hyperlipidemia Other   . Cancer Other   . Sleep apnea Other   . Obesity Other     Social History Social History   Tobacco Use  . Smoking status: Never Smoker  . Smokeless tobacco: Never Used  Substance Use Topics  . Alcohol use: No  . Drug use: No     Allergies   Pollen extract; Tomato; and Wool alcohol [lanolin]   Review of Systems Review of Systems  Constitutional: Negative for chills, diaphoresis and fever.  Respiratory: Positive for shortness of breath. Negative for cough.   Cardiovascular: Positive for chest pain and leg swelling.  Gastrointestinal: Negative for abdominal pain, diarrhea, nausea and vomiting.  Genitourinary: Positive for frequency.  Musculoskeletal: Negative for back pain.  Neurological: Negative for dizziness, weakness, numbness and headaches.  All other systems reviewed and are  negative.    Physical Exam Updated Vital Signs BP (!) 187/79 (BP Location: Right Arm)   Pulse 68   Temp 98.3 F (36.8 C) (Oral)   Resp 20   SpO2 96%   Physical Exam  Constitutional: She appears well-developed and well-nourished. No distress.  HENT:  Head: Normocephalic and atraumatic.  Eyes: Conjunctivae and EOM are normal. Pupils are equal, round, and reactive to light.  Neck: Normal range of motion. Neck supple.  Cardiovascular: Normal rate, regular rhythm, normal heart sounds and intact distal pulses.  Pulmonary/Chest: Effort normal and breath sounds normal. No respiratory distress.  Abdominal: Soft. There is no tenderness. There is no guarding.  Musculoskeletal: She exhibits edema.  Bilateral lower extremity edema at the ankles without noted increased warmth, erythema, or tenderness.  Lymphadenopathy:    She has no cervical adenopathy.  Neurological: She is alert.  Skin: Skin is warm and dry. Capillary refill takes less than 2 seconds. She is not diaphoretic.  Psychiatric: She has a normal mood and affect. Her behavior is normal.  Nursing note and vitals reviewed.    ED Treatments / Results  Labs (all labs ordered are listed, but only abnormal results are displayed) Labs Reviewed  BASIC METABOLIC PANEL - Abnormal; Notable for the following components:      Result Value   Glucose, Bld 125 (*)    All other components within normal limits  URINALYSIS, ROUTINE W REFLEX MICROSCOPIC - Abnormal; Notable for the following components:   Color, Urine STRAW (*)    Ketones, ur 5 (*)    All other components within normal limits  D-DIMER, QUANTITATIVE (NOT AT [facility]) - Abnormal; Notable for the following components:   D-Dimer, Quant 0.97 (*)    All other components within normal limits  CBC  BRAIN NATRIURETIC PEPTIDE  I-STAT TROPONIN, ED    EKG  EKG Interpretation  Date/Time:  Sunday February 02 2017 17:33:52 EST Ventricular Rate:  86 PR Interval:    QRS  Duration: 77 QT Interval:  421 QTC Calculation: 504 R Axis:   54 Text Interpretation:  Sinus rhythm ST elevation, consider inferior injury Prolonged QT interval Baseline wander in lead(s) II III aVL aVF Wandering baseline.  When compared to prior, NO significant changes seen.  No STEMI Confirmed by Antony Blackbird [phone]) on 02/02/2017 6:58:01 PM       Radiology Dg Chest 2 View  Result Date: 02/02/2017 CLINICAL DATA:  Chest pain EXAM: CHEST  2 VIEW COMPARISON:  12/05/2014 FINDINGS: Mild cardiomegaly. No confluent opacities, effusions or overt edema. No acute bony abnormality. IMPRESSION: Cardiomegaly.  No active disease. Electronically Signed   By: Rolm Baptise M.D.   On: 02/02/2017 18:01   Ct Angio Chest Pe W And/or Wo Contrast  Result Date: 02/02/2017 CLINICAL DATA:  Intermittent central chest pressure and shortness of breath with exertion since this morning. EXAM: CT ANGIOGRAPHY CHEST WITH CONTRAST TECHNIQUE: Multidetector CT imaging of the chest was performed using the standard protocol during bolus administration of intravenous contrast. Multiplanar CT image reconstructions and MIPs were obtained to evaluate the vascular anatomy. CONTRAST:  <See Chart> ISOVUE-370 IOPAMIDOL (ISOVUE-370) INJECTION 76% COMPARISON:  Chest x-ray today. FINDINGS: Cardiovascular: Heart is normal in size thoracic aorta is within normal. There is prominence of the main pulmonary artery segment. No evidence pulmonary embolism. Mediastinum/Nodes: No mediastinal or hilar adenopathy. Remaining mediastinal structures are within normal. Lungs/Pleura: Lungs are adequately inflated without focal airspace consolidation or effusion. Airways are within normal. Upper Abdomen: Within normal. Musculoskeletal: Minimal degenerate change of the spine. Review of the MIP images confirms the above findings. IMPRESSION: No evidence pulmonary embolism.  No acute cardiopulmonary disease. Prominence of the main pulmonary artery which may be  seen with pulmonary arterial hypertension. Electronically Signed   By: Marin Olp M.D.   On: 02/02/2017 21:26    Procedures Procedures (including critical care time)  Medications Ordered in ED Medications  nitroGLYCERIN (NITROSTAT) SL tablet 0.4 mg (not administered)  aspirin chewable tablet 324 mg (not administered)  losartan (COZAAR) tablet 100 mg (100 mg Oral Given 02/02/17 2136)  hydrochlorothiazide (MICROZIDE) capsule 25 mg (25 mg Oral Given 02/02/17 2136)  iopamidol (ISOVUE-370) 76 % injection (100 mLs Intravenous Contrast Given 02/02/17 2102)     Initial Impression / Assessment and Plan / ED Course  I have reviewed the triage vital signs and the nursing notes.  Pertinent labs & imaging results that were available during my care of the patient were reviewed by me and  considered in my medical decision making (see chart for details).  Clinical Course as of Feb 02 2150  Nancy Fetter Feb 02, 2017  2030 Discussed positive dimer.  Patient agrees to CT scan.  Patient currently pain-free.  [SJ]  2130 Spoke with patient about CT results.  Patient continues to be pain-free.  Discussed plan for observation admission.  Patient agrees to the plan.  [SJ]  2149 Spoke with Dr. Hal Hope, hospitalist, for patient admission.  [SJ]    Clinical Course User Index [SJ] Marsalis Beaulieu C, PA-C   Patient presents with chest pain and intermittent shortness of breath with exertion. HEART score is 4, indicating moderate risk for a cardiac event. Wells criteria score is 1.5, indicating low risk for PE.  Initial troponin negative.  CT negative for evidence of PE.  Once patient's pain resolved, it did not recur during ED course.  Home antihypertensive medications given.  Admission for chest pain rule out.  Findings and plan of care discussed with Antony Blackbird, MD.    Vitals:   02/02/17 1953 02/02/17 2000 02/02/17 2139 02/02/17 2140  BP: (!) 180/80 (!) 175/79 (!) 161/74   Pulse: 72 63  76  Resp: 13 (!) 21     Temp:      TempSrc:      SpO2: 100% 100%  100%     Final Clinical Impressions(s) / ED Diagnoses   Final diagnoses:  Chest pain, unspecified type    ED Discharge Orders    None       Layla Maw 02/02/17 2156    Tegeler, Gwenyth Allegra, MD 02/03/17 [phone]

## 2017-02-02 NOTE — H&P (Signed)
History and Physical    Rodnisha Blomgren SFK:[id number] DOB: Dec 14, 1955 DOA: 02/02/2017  PCP: Arnoldo Morale, MD  Patient coming from: Home.  Chief Complaint: Chest pain.  HPI: Jaz Laningham is a 61 y.o. female with history of diabetes mellitus type 2 on diet, hypertension, hypo-thyroidism and sleep apnea presents to the ER with complaints of chest pain.  Patient has been having chest pain intermittently over the last 2 weeks.  Happens even at rest.  Last for a few minutes each time retrosternal nonradiating with some shortness of breath.  Denies any nausea vomiting abdominal pain or diarrhea or cough.  ED Course: In the ER patient had EKG which was showing nonspecific changes compatible with old EKG troponins were negative and the CT angiogram of the chest was done which was negative for pulmonary embolism but showed features concerning for pulmonary hypertension.  Patient is presently chest pain-free and admitted for further management.  Patient has had a stress test in 2012 which was unremarkable.  Review of Systems: As per HPI, rest all negative.   Past Medical History:  Diagnosis Date  . Colon polyp    adenomatous  . Diabetes mellitus without complication ([place])   . Hyperlipidemia   . Hypertension   . Stroke ([place])   . Thyroid disease     Past Surgical History:  Procedure Laterality Date  . ABDOMINAL HYSTERECTOMY    . BLADDER SUSPENSION    . EUS N/A 08/12/2013   Procedure: LOWER ENDOSCOPIC ULTRASOUND (EUS);  Surgeon: Milus Banister, MD;  Location: [facility];  Service: Endoscopy;  Laterality: N/A;  . KNEE SURGERY    . TUBAL LIGATION       reports that  has never smoked. she has never used smokeless tobacco. She reports that she does not drink alcohol or use drugs.  Allergies  Allergen Reactions  . Pollen Extract Other (See Comments)    Reaction unknown  . Tomato Other (See Comments)    Reaction unknown  . Wool Alcohol [Lanolin] Other (See Comments)    Reaction  unknown    Family History  Problem Relation Age of Onset  . Hypertension Mother   . Heart disease Maternal Grandmother   . Cancer Maternal Grandmother        ovarian  . Cancer Maternal Grandfather   . Hypertension Other   . Hyperlipidemia Other   . Cancer Other   . Sleep apnea Other   . Obesity Other     Prior to Admission medications   Medication Sig Start Date End Date Taking? Authorizing Provider  aspirin (ASPIR-81) 81 MG EC tablet Take 1 tablet (81 mg total) by mouth daily. Swallow whole. 11/08/15   Arnoldo Morale, MD  Blood Glucose Monitoring Suppl (TRUE METRIX METER) DEVI 1 each by Does not apply route daily before breakfast. Patient not taking: Reported on 01/27/2017 11/08/15   Arnoldo Morale, MD  cetirizine (ZYRTEC) 10 MG tablet Take 1 tablet (10 mg total) by mouth daily. Patient not taking: Reported on 01/27/2017 09/26/16   Arnoldo Morale, MD  clotrimazole (LOTRIMIN) 1 % cream Apply 1 application topically 2 (two) times daily. Patient not taking: Reported on 07/11/2016 02/21/16   Arnoldo Morale, MD  glucose blood (TRUE METRIX BLOOD GLUCOSE TEST) test strip Use 1 time daily before breakfast Patient not taking: Reported on 01/27/2017 11/08/15   Arnoldo Morale, MD  levothyroxine (SYNTHROID, LEVOTHROID) 150 MCG tablet Take 1 tablet (150 mcg total) by mouth daily before breakfast. 01/28/17   Arnoldo Morale, MD  losartan-hydrochlorothiazide (HYZAAR) 100-25 MG tablet Take 1 tablet by mouth daily. 01/27/17   Arnoldo Morale, MD  metFORMIN (GLUCOPHAGE) 500 MG tablet Take 2 tablets (1,000 mg total) by mouth 2 (two) times daily with a meal. Patient not taking: Reported on 01/27/2017 09/26/16   Arnoldo Morale, MD  polyethylene glycol powder (GLYCOLAX/MIRALAX) powder TAKE 17 GRAMS WITH BY MOUTH DAILY. Patient not taking: Reported on 01/27/2017 09/26/16   Arnoldo Morale, MD  TRUEPLUS LANCETS 28G MISC 1 each by Does not apply route daily before breakfast. Patient not taking: Reported on 01/27/2017 11/08/15    Arnoldo Morale, MD    Physical Exam: Vitals:   02/02/17 1953 02/02/17 2000 02/02/17 2139 02/02/17 2140  BP: (!) 180/80 (!) 175/79 (!) 161/74   Pulse: 72 63  76  Resp: 13 (!) 21    Temp:      TempSrc:      SpO2: 100% 100%  100%      Constitutional: Moderately built and nourished. Vitals:   02/02/17 1953 02/02/17 2000 02/02/17 2139 02/02/17 2140  BP: (!) 180/80 (!) 175/79 (!) 161/74   Pulse: 72 63  76  Resp: 13 (!) 21    Temp:      TempSrc:      SpO2: 100% 100%  100%   Eyes: Anicteric no pallor. ENMT: No discharge from the ears eyes nose or mouth. Neck: No JVD appreciated no mass felt. Respiratory: No rhonchi or crepitations. Cardiovascular: S1-S2 heard no murmurs appreciated. Abdomen: Soft nontender bowel sounds present. Musculoskeletal: No edema.  No joint effusion. Skin: No rash.  Skin appears warm. Neurologic: Alert awake oriented to time place and person.  Moves all extremities. Psychiatric: Appears normal.  Normal affect.   Labs on Admission: I have personally reviewed following labs and imaging studies  CBC: Recent Labs  Lab 02/02/17 1746  WBC 7.7  HGB 13.2  HCT 42.4  MCV 94.0  PLT 989   Basic Metabolic Panel: Recent Labs  Lab 01/27/17 0938 02/02/17 1746  NA 140 138  K 4.0 3.7  CL 100 102  CO2 29 29  GLUCOSE 139* 125*  BUN 8 12  CREATININE 0.76 0.82  CALCIUM 8.9 9.2   GFR: Estimated Creatinine Clearance: 94.2 mL/min (by C-G formula based on SCr of 0.82 mg/dL). Liver Function Tests: Recent Labs  Lab 01/27/17 0938  AST 15  ALT 18  ALKPHOS 89  BILITOT 0.3  PROT 6.7  ALBUMIN 3.8   No results for input(s): LIPASE, AMYLASE in the last 168 hours. No results for input(s): AMMONIA in the last 168 hours. Coagulation Profile: No results for input(s): INR, PROTIME in the last 168 hours. Cardiac Enzymes: No results for input(s): CKTOTAL, CKMB, CKMBINDEX, TROPONINI in the last 168 hours. BNP (last 3 results) No results for input(s): PROBNP  in the last 8760 hours. HbA1C: No results for input(s): HGBA1C in the last 72 hours. CBG: No results for input(s): GLUCAP in the last 168 hours. Lipid Profile: No results for input(s): CHOL, HDL, LDLCALC, TRIG, CHOLHDL, LDLDIRECT in the last 72 hours. Thyroid Function Tests: No results for input(s): TSH, T4TOTAL, FREET4, T3FREE, THYROIDAB in the last 72 hours. Anemia Panel: No results for input(s): VITAMINB12, FOLATE, FERRITIN, TIBC, IRON, RETICCTPCT in the last 72 hours. Urine analysis:    Component Value Date/Time   COLORURINE STRAW (A) 02/02/2017 1938   APPEARANCEUR CLEAR 02/02/2017 1938   LABSPEC 1.009 02/02/2017 1938   PHURINE 7.0 02/02/2017 1938   GLUCOSEU NEGATIVE 02/02/2017 1938   HGBUR  NEGATIVE 02/02/2017 1938   BILIRUBINUR NEGATIVE 02/02/2017 1938   KETONESUR 5 (A) 02/02/2017 1938   PROTEINUR NEGATIVE 02/02/2017 1938   UROBILINOGEN 1 04/15/2013 0958   NITRITE NEGATIVE 02/02/2017 1938   LEUKOCYTESUR NEGATIVE 02/02/2017 1938   Sepsis Labs: @LABRCNTIP (procalcitonin:4,lacticidven:4) )No results found for this or any previous visit (from the past 240 hour(s)).   Radiological Exams on Admission: Dg Chest 2 View  Result Date: 02/02/2017 CLINICAL DATA:  Chest pain EXAM: CHEST  2 VIEW COMPARISON:  12/05/2014 FINDINGS: Mild cardiomegaly. No confluent opacities, effusions or overt edema. No acute bony abnormality. IMPRESSION: Cardiomegaly.  No active disease. Electronically Signed   By: Rolm Baptise M.D.   On: 02/02/2017 18:01   Ct Angio Chest Pe W And/or Wo Contrast  Result Date: 02/02/2017 CLINICAL DATA:  Intermittent central chest pressure and shortness of breath with exertion since this morning. EXAM: CT ANGIOGRAPHY CHEST WITH CONTRAST TECHNIQUE: Multidetector CT imaging of the chest was performed using the standard protocol during bolus administration of intravenous contrast. Multiplanar CT image reconstructions and MIPs were obtained to evaluate the vascular anatomy.  CONTRAST:  <See Chart> ISOVUE-370 IOPAMIDOL (ISOVUE-370) INJECTION 76% COMPARISON:  Chest x-ray today. FINDINGS: Cardiovascular: Heart is normal in size thoracic aorta is within normal. There is prominence of the main pulmonary artery segment. No evidence pulmonary embolism. Mediastinum/Nodes: No mediastinal or hilar adenopathy. Remaining mediastinal structures are within normal. Lungs/Pleura: Lungs are adequately inflated without focal airspace consolidation or effusion. Airways are within normal. Upper Abdomen: Within normal. Musculoskeletal: Minimal degenerate change of the spine. Review of the MIP images confirms the above findings. IMPRESSION: No evidence pulmonary embolism.  No acute cardiopulmonary disease. Prominence of the main pulmonary artery which may be seen with pulmonary arterial hypertension. Electronically Signed   By: Marin Olp M.D.   On: 02/02/2017 21:26    EKG: Independently reviewed.  Normal sinus rhythm with nonspecific changes in inferior leads comparable to the old EKG.  Assessment/Plan Principal Problem:   Chest pain Active Problems:   HTN (hypertension)   Type 2 diabetes mellitus without complication, without long-term current use of insulin (HCC)   Hypothyroidism    1. Chest pain -given the risk factors including diabetes mellitus type 2, hypertension we will cycle cardiac markers to rule out ACS and keep patient n.p.o. in a.m. in anticipation of cardiac procedure.  Cardiology consult has been requested.  Check 2D echo, place patient on aspirin.  Nitroglycerin. 2. Hypertension uncontrolled -probably contributing to patient's symptoms.  Patient is on Hyzaar.  I will place patient on as needed IV hydralazine.  Closely follow blood pressure trends. 3. Diabetes mellitus type 2 on diet -as per the PCPs notes patient has refused to take metformin and statins previously.  For now I have kept patient on sliding scale coverage. 4. Hypothyroidism on Synthroid. 5. History of  sleep apnea has not used CPAP due to intolerance.   DVT prophylaxis: Lovenox. Code Status: Full code. Family Communication: Discussed with patient. Disposition Plan: Home. Consults called: Cardiology. Admission status: Observation.   Rise Patience MD [facility] Pager [phone].  If 7PM-7AM, please contact night-coverage [email or web address] Password [facility]  02/02/2017, 9:53 PM

## 2017-02-02 NOTE — ED Notes (Signed)
Attempted report x2. Nurse just needs a few minutes before I call back.

## 2017-02-03 ENCOUNTER — Other Ambulatory Visit: Payer: Self-pay

## 2017-02-03 ENCOUNTER — Other Ambulatory Visit: Payer: Self-pay | Admitting: Cardiology

## 2017-02-03 ENCOUNTER — Observation Stay (HOSPITAL_BASED_OUTPATIENT_CLINIC_OR_DEPARTMENT_OTHER): Payer: Self-pay

## 2017-02-03 ENCOUNTER — Encounter (HOSPITAL_COMMUNITY): Payer: Self-pay | Admitting: Cardiology

## 2017-02-03 DIAGNOSIS — G4733 Obstructive sleep apnea (adult) (pediatric): Secondary | ICD-10-CM

## 2017-02-03 DIAGNOSIS — I10 Essential (primary) hypertension: Secondary | ICD-10-CM

## 2017-02-03 DIAGNOSIS — R079 Chest pain, unspecified: Secondary | ICD-10-CM

## 2017-02-03 DIAGNOSIS — R072 Precordial pain: Secondary | ICD-10-CM

## 2017-02-03 LAB — ECHOCARDIOGRAM COMPLETE
Ao-asc: 35.4 cm
E/e' ratio: 7.67
FS: 42 % (ref 28–44)
HEIGHTINCHES: 65 in
IVS/LV PW RATIO, ED: 0.83
LA ID, A-P, ES: 38.5 mm
LA diam end sys: 38.5 mm
LADIAMINDEX: 1.33 cm/m2
LDCA: 3.2 cm2
LV E/e'average: 7.67
LV TDI E'MEDIAL: 5.02
LV e' LATERAL: 10.8 cm/s
LVEEMED: 7.67
LVOTD: 20.2 mm
MV Peak grad: 3 mmHg
MV pk A vel: 89.4 m/s
MV pk E vel: 82.8 m/s
PW: 12.7 mm — AB (ref 0.6–1.1)
TDI e' lateral: 10.8
WEIGHTICAEL: 5916.8 [oz_av]

## 2017-02-03 LAB — GLUCOSE, CAPILLARY
GLUCOSE-CAPILLARY: 109 mg/dL — AB (ref 65–99)
Glucose-Capillary: 127 mg/dL — ABNORMAL HIGH (ref 65–99)

## 2017-02-03 LAB — TROPONIN I
Troponin I: <0.03 ng/mL (ref <0.03)
Troponin I: <0.03 ng/mL (ref <0.03)

## 2017-02-03 MED ORDER — NITROGLYCERIN 0.4 MG SL SUBL: 0.4000 mg | SUBLINGUAL_TABLET | SUBLINGUAL | 0 refills | Status: DC | PRN

## 2017-02-03 MED ORDER — ISOSORBIDE MONONITRATE ER 60 MG PO TB24: 60.0000 mg | ORAL_TABLET | Freq: Every day | ORAL | 0 refills | Status: DC

## 2017-02-03 MED ORDER — HYDRALAZINE HCL 20 MG/ML IJ SOLN
10.0000 mg | INTRAMUSCULAR | Status: DC | PRN
  Filled 2017-02-03: qty 1

## 2017-02-03 MED ORDER — METFORMIN HCL 500 MG PO TABS: 1000.0000 mg | ORAL_TABLET | Freq: Two times a day (BID) | ORAL | 0 refills | Status: DC

## 2017-02-03 NOTE — Plan of Care (Signed)
Pt's questions were answered to her satisfaction.

## 2017-02-03 NOTE — Consult Note (Signed)
Cardiology Consultation:   [name]; [id number]; [birth year]   Admit date: 02/02/2017 Date of Consult: 02/03/2017  Primary Care Provider: Arnoldo Morale, MD Primary Cardiologist: New- Dr. Percival Spanish (seen by Dr. Harrington Challenger in 2008)   Patient Profile:   Tiffany Strickland is a 61 y.o. female with a hx of DM type 2 diet controlled, HLD, HTN, stroke, hypothyroidism and sleep apnea who is being seen today for the evaluation of chest pain at the request of Dr. Carolin Sicks.  History of Present Illness:   Tiffany Strickland has been experiencing exertional chest pressure and DOE with walking outside for about the last month. This resolved with rest. Yesterday she was on the bus and her chest pressure was worse and radiated down both arms as a tingling/burning sensation L>R. She also had pressure behind her left eye and a "funny feeling" came over her head. She denies any difficulty speaking, weakness of her limbs or confusion. She says that she spoke to her son on the phone as this was occurring and her was asking her questions to make sure she was oriented. She denies orthopnea or PND but does have occasional lower extremity edema that improves with elevating her legs.   Tiffany Strickland has no known history of MI or CHF. She did have a stroke at least 7-8 years ago with no residual except for slower speech per her daughter. She was diagnosed with sleep apnea many years ago but could not tolerate the CPAP due to feeling claustrophobic. She thinks that she would like to try one of the newer masks now and would be willing to undergo another sleep study.   She has reportedly not been taking her lisinopril for blood pressure in about 2 months as she had a cough and the pharmacist told her that the lisinopril could be causing it. Her cough resolved with discontinuation.   Her mother is alive and well at age 45, has hypertension. She does not know her father's health history and she is an only child. She has never  smoked but she is exposed to second hand smoke when she visits her freind. She does not drink alcohol.   Past Medical History:  Diagnosis Date  . Colon polyp    adenomatous  . Diabetes mellitus without complication ([place])   . Hyperlipidemia   . Hypertension   . Stroke ([place])   . Thyroid disease     Past Surgical History:  Procedure Laterality Date  . ABDOMINAL HYSTERECTOMY    . BLADDER SUSPENSION    . EUS N/A 08/12/2013   Procedure: LOWER ENDOSCOPIC ULTRASOUND (EUS);  Surgeon: Milus Banister, MD;  Location: [facility];  Service: Endoscopy;  Laterality: N/A;  . KNEE SURGERY    . TUBAL LIGATION       Home Medications:  Prior to Admission medications   Medication Sig Start Date End Date Taking? Authorizing Provider  aspirin (ASPIR-81) 81 MG EC tablet Take 1 tablet (81 mg total) by mouth daily. Swallow whole. 11/08/15  Yes Arnoldo Morale, MD  levothyroxine (SYNTHROID, LEVOTHROID) 150 MCG tablet Take 1 tablet (150 mcg total) by mouth daily before breakfast. 01/28/17  Yes Amao, Enobong, MD  Blood Glucose Monitoring Suppl (TRUE METRIX METER) DEVI 1 each by Does not apply route daily before breakfast. Patient not taking: Reported on 01/27/2017 11/08/15   Arnoldo Morale, MD  cetirizine (ZYRTEC) 10 MG tablet Take 1 tablet (10 mg total) by mouth daily. Patient not taking: Reported on 01/27/2017 09/26/16   Arnoldo Morale,  MD  clotrimazole (LOTRIMIN) 1 % cream Apply 1 application topically 2 (two) times daily. Patient not taking: Reported on 07/11/2016 02/21/16   Arnoldo Morale, MD  glucose blood (TRUE METRIX BLOOD GLUCOSE TEST) test strip Use 1 time daily before breakfast Patient not taking: Reported on 01/27/2017 11/08/15   Arnoldo Morale, MD  losartan-hydrochlorothiazide (HYZAAR) 100-25 MG tablet Take 1 tablet by mouth daily. 01/27/17   Arnoldo Morale, MD  metFORMIN (GLUCOPHAGE) 500 MG tablet Take 2 tablets (1,000 mg total) by mouth 2 (two) times daily with a meal. Patient not taking: Reported on  01/27/2017 09/26/16   Arnoldo Morale, MD  polyethylene glycol powder (GLYCOLAX/MIRALAX) powder TAKE 17 GRAMS WITH BY MOUTH DAILY. Patient not taking: Reported on 01/27/2017 09/26/16   Arnoldo Morale, MD  TRUEPLUS LANCETS 28G MISC 1 each by Does not apply route daily before breakfast. Patient not taking: Reported on 01/27/2017 11/08/15   Arnoldo Morale, MD    Inpatient Medications: Scheduled Meds: . aspirin EC  325 mg Oral Daily  . enoxaparin (LOVENOX) injection  40 mg Subcutaneous QHS  . hydrochlorothiazide  25 mg Oral Daily  . insulin aspart  0-9 Units Subcutaneous TID WC  . levothyroxine  137 mcg Oral QAC breakfast  . losartan  100 mg Oral Daily   Continuous Infusions:  PRN Meds: acetaminophen, hydrALAZINE, nitroGLYCERIN, ondansetron (ZOFRAN) IV  Allergies:    Allergies  Allergen Reactions  . Pollen Extract Other (See Comments)    Reaction unknown  . Tomato Itching    Reaction unknown  . Wool Alcohol [Lanolin] Itching    Reaction unknown    Social History:   Social History   Socioeconomic History  . Marital status: Single    Spouse name: Not on file  . Number of children: Not on file  . Years of education: Not on file  . Highest education level: Not on file  Social Needs  . Financial resource strain: Not on file  . Food insecurity - worry: Not on file  . Food insecurity - inability: Not on file  . Transportation needs - medical: Not on file  . Transportation needs - non-medical: Not on file  Occupational History  . Not on file  Tobacco Use  . Smoking status: Never Smoker  . Smokeless tobacco: Never Used  Substance and Sexual Activity  . Alcohol use: No  . Drug use: No  . Sexual activity: Yes    Birth control/protection: Surgical  Other Topics Concern  . Not on file  Social History Narrative  . Not on file    Family History:    Family History  Problem Relation Age of Onset  . Hypertension Mother   . Heart disease Maternal Grandmother   . Cancer Maternal  Grandmother        ovarian  . Cancer Maternal Grandfather   . Hypertension Other   . Hyperlipidemia Other   . Cancer Other   . Sleep apnea Other   . Obesity Other      ROS:  Please see the history of present illness.  ROS  All other ROS reviewed and negative.     Physical Exam/Data:   Vitals:   02/02/17 2139 02/02/17 2140 02/02/17 2323 02/03/17 0527  BP: (!) 161/74  (!) 178/77 133/85  Pulse:  76 72 66  Resp:   18 18  Temp:   (!) 97.4 F (36.3 C) (!) 97.5 F (36.4 C)  TempSrc:   Oral Oral  SpO2:  100% 97% 98%  Weight:   Marland Kitchen)  369 lb 12.8 oz (167.7 kg)   Height:   5\' 5"  (1.651 m)    No intake or output data in the 24 hours ending 02/03/17 0757 Filed Weights   02/02/17 2323  Weight: (!) 369 lb 12.8 oz (167.7 kg)   Body mass index is 61.54 kg/m.  General:  Well nourished, well developed, in no acute distress HEENT: normal except poor dentition Neck: no JVD Vascular: No carotid bruits; FA pulses 2+ bilaterally without bruits  Cardiac:  normal S1, S2; RRR; no murmur  Lungs:  clear to auscultation bilaterally, no wheezing, rhonchi or rales  Abd: soft, nontender, no hepatomegaly  Ext: no edema Musculoskeletal:  No deformities, BUE and BLE strength normal and equal Skin: warm and dry  Neuro:  CNs 2-12 intact, no focal abnormalities noted Psych:  Normal affect   EKG:  The EKG was personally reviewed and demonstrates:  Sinus arrhythmia 78 bpm, minimal ST elevation in inferior leads-similar to previous in 2016 Telemetry:  Telemetry was personally reviewed and demonstrates:  Sinus rhythm in the 60's-80's  Relevant CV Studies:  Echo pending  Echo 12/23/2014 Study Conclusions - Left ventricle: The cavity size was normal. Systolic function was   normal. The estimated ejection fraction was in the range of 60%   to 65%. Wall motion was normal; there were no regional wall   motion abnormalities. - Pulmonic valve: There was mild regurgitation.  Nuclear stress test   01/22/2011 IMPRESSION: 1.  No fixed or reversible defects to suggest inducible ischemia. 2.  Normal left ventricular wall motion and thickening. 3.  Normal ejection fraction  Laboratory Data:  Chemistry Recent Labs  Lab 01/27/17 0938 02/02/17 1746 02/02/17 2242  NA 140 138  --   K 4.0 3.7  --   CL 100 102  --   CO2 29 29  --   GLUCOSE 139* 125*  --   BUN 8 12  --   CREATININE 0.76 0.82 0.65  CALCIUM 8.9 9.2  --   GFRNONAA 84 >60 >60  GFRAA 97 >60 >60  ANIONGAP  --  7  --     Recent Labs  Lab 01/27/17 0938  PROT 6.7  ALBUMIN 3.8  AST 15  ALT 18  ALKPHOS 89  BILITOT 0.3   Hematology Recent Labs  Lab 02/02/17 1746 02/02/17 2242  WBC 7.7 6.9  RBC 4.51 4.22  HGB 13.2 12.7  HCT 42.4 39.3  MCV 94.0 93.1  MCH 29.3 30.1  MCHC 31.1 32.3  RDW 14.1 14.1  PLT 312 306   Cardiac Enzymes Recent Labs  Lab 02/02/17 2242 02/03/17 0506  TROPONINI <0.03 <0.03    Recent Labs  Lab 02/02/17 1758  TROPIPOC 0.00    BNP Recent Labs  Lab 02/02/17 1746  BNP 20.7    DDimer  Recent Labs  Lab 02/02/17 1948  DDIMER 0.97*    Radiology/Studies:  Dg Chest 2 View  Result Date: 02/02/2017 CLINICAL DATA:  Chest pain EXAM: CHEST  2 VIEW COMPARISON:  12/05/2014 FINDINGS: Mild cardiomegaly. No confluent opacities, effusions or overt edema. No acute bony abnormality. IMPRESSION: Cardiomegaly.  No active disease. Electronically Signed   By: Rolm Baptise M.D.   On: 02/02/2017 18:01   Ct Angio Chest Pe W And/or Wo Contrast  Result Date: 02/02/2017 CLINICAL DATA:  Intermittent central chest pressure and shortness of breath with exertion since this morning. EXAM: CT ANGIOGRAPHY CHEST WITH CONTRAST TECHNIQUE: Multidetector CT imaging of the chest was performed using the standard  protocol during bolus administration of intravenous contrast. Multiplanar CT image reconstructions and MIPs were obtained to evaluate the vascular anatomy. CONTRAST:  <See Chart> ISOVUE-370 IOPAMIDOL  (ISOVUE-370) INJECTION 76% COMPARISON:  Chest x-ray today. FINDINGS: Cardiovascular: Heart is normal in size thoracic aorta is within normal. There is prominence of the main pulmonary artery segment. No evidence pulmonary embolism. Mediastinum/Nodes: No mediastinal or hilar adenopathy. Remaining mediastinal structures are within normal. Lungs/Pleura: Lungs are adequately inflated without focal airspace consolidation or effusion. Airways are within normal. Upper Abdomen: Within normal. Musculoskeletal: Minimal degenerate change of the spine. Review of the MIP images confirms the above findings. IMPRESSION: No evidence pulmonary embolism.  No acute cardiopulmonary disease. Prominence of the main pulmonary artery which may be seen with pulmonary arterial hypertension. Electronically Signed   By: Marin Olp M.D.   On: 02/02/2017 21:26    Assessment and Plan:   1. Chest pain -Pt has normal echo in 12/2014 and normal stress test in 01/2011. -Pt with exertional chest pressure and DOE for about a month, became worse yesterday while out on the bus and associated with bilateral arm tingling/burning. Also had pressure behind left eye and head feeling funny. Did have some mild chest pressure and DOE with walking in the hall this am.  -EKG is without ischemic changes -Troponins negative X 3.  BNP normal at 20.7.  -Electrolytes and kidney function normal by labs. CBC unremarkable.  -D-dimer elevated at 0.97. CTA chest negative for PE, No acute cardiopulmonary disease.  showed prominence of the main pulmonary artery segment -CVD risk factors include DM, HTN, obesity, previous stroke, sleep apnea -Pt having typical exertional chest pain and dyspnea. Differentials include angina -- chest pressure and DOE with exertion, resolve at rest. Also could be related to uncontrolled blood pressure as pt has not been taking her BP med for about 2 months.  -Will likely do nuclear stress test today. Pt is NPO. Will discuss with  Dr. Percival Spanish.   2. Hypertension -Pt had been on lisinopril at home although she stopped taking this about 2 months ago as she had a cough and the pharmacist told her the lisinopril was likely causing it. She has been put on an ARB now.   -Blood pressure initially elevated, improved this am  3. Hyperlipemia -LDL 93 on 01/27/17  4. Diabetes type 2 -Pt was prescribed metformin but she wanted to try diet and exercise first.  Mangement per IM with SSI -No A1c in Epic  5. Hypothyroidism -Recent TSH mildly elevated at 5.360. Synthroid per IM.   6. OSA -was diagnosed many years ago but could not tolerate the CPAP due to feeling claustrophobic. She thinks that she would like to try one of the newer masks now and would be willing to undergo another sleep study.     For questions or updates, please contact [place] Please consult [email or web address] for contact info under Cardiology/STEMI.   Signed, Daune Perch, NP  02/03/2017 7:57 AM    History and all data above reviewed.  Patient examined.  I agree with the findings as above.  The patient had chest pain.  This was upper mid chest with radiation to both arms and tingling in the fingers and pain behind the right eye.  She has not had this before.  No objective evidence of ischemia.  No EKG or enzyme changes.  Pain went away spontaneously after a few hours.   The patient exam reveals COR:RRR  ,  Lungs: Clear  ,  Abd:  Positive bowel sounds, no rebound no guarding, Ext No edema  .  All available labs, radiology testing, previous records reviewed. Agree with documented assessment and plan. CHEST PAIN:  Atypical rather than typical features.  No EKG or enzyme changes.   Send home on Imdur 60 mg daily and give a prescription for SLNTG.   HEART Score is 3.  OK for out patient exercise Lexiscan Myoview pending the results of the echo that is being done now.  PROLONGED QT:  No symptoms.  Avoid QT prolonging drugs.    Jeneen Rinks Aevah Stansbery  11:29 AM   02/03/2017

## 2017-02-03 NOTE — Discharge Summary (Signed)
Physician Discharge Summary  Tiffany Strickland FAO:[id number] DOB: August 02, 1955 DOA: 02/02/2017  PCP: Arnoldo Morale, MD  Admit date: 02/02/2017 Discharge date: 02/03/2017  Admitted From:home Disposition:home  Recommendations for Outpatient Follow-up:  1. Follow up with PCP in 1-2 weeks 2. Please obtain BMP/CBC in one week   Home Health:no Equipment/Devices:no Discharge Condition:stable CODE STATUS:full code Diet recommendation:heart healthy  Brief/Interim Summary: 61 year old morbidly obese female with history of hypertension, type 2 diabetes diet-controlled, hyperlipidemia, hypothyroidism, sleep apnea presented with left-sided chest pain on and off for about a month.  EKG and troponin unremarkable for ischemia.  Chest pain improved.  Evaluated by cardiologist recommended to start on Imdur and nitroglycerin as needed for the chest pain.  This is likely atypical chest pain as per cardiologist.  CT chest negative for PE.  Continue aspirin, Synthroid and home medication.  I reviewed this with the patient at bedside and she verbalized understanding.  Also discussed with the nursing staff.  Patient will be discharged home when echo  result is reviewed.  I recommended patient to follow-up with her PCP and cardiologist to discuss about outpatient stress test.  Discharge Diagnoses:  Principal Problem:   Chest pain Active Problems:   HTN (hypertension)   Type 2 diabetes mellitus without complication, without long-term current use of insulin (HCC)   Hypothyroidism   OSA (obstructive sleep apnea)    Discharge Instructions  Discharge Instructions    Call MD for:  difficulty breathing, headache or visual disturbances   Complete by:  As directed    Call MD for:  extreme fatigue   Complete by:  As directed    Call MD for:  hives   Complete by:  As directed    Call MD for:  persistant dizziness or light-headedness   Complete by:  As directed    Call MD for:  persistant nausea and vomiting    Complete by:  As directed    Call MD for:  severe uncontrolled pain   Complete by:  As directed    Call MD for:  temperature >100.4   Complete by:  As directed    Diet - low sodium heart healthy   Complete by:  As directed    Diet Carb Modified   Complete by:  As directed    Discharge instructions   Complete by:  As directed    Please monitor blood pressure.   Increase activity slowly   Complete by:  As directed      Allergies as of 02/03/2017      Reactions   Pollen Extract Other (See Comments)   Reaction unknown   Tomato Itching   Reaction unknown   Wool Alcohol [lanolin] Itching   Reaction unknown      Medication List    STOP taking these medications   cetirizine 10 MG tablet Commonly known as:  ZYRTEC   clotrimazole 1 % cream Commonly known as:  LOTRIMIN     TAKE these medications   aspirin 81 MG EC tablet Commonly known as:  ASPIR-81 Take 1 tablet (81 mg total) by mouth daily. Swallow whole.   glucose blood test strip Commonly known as:  TRUE METRIX BLOOD GLUCOSE TEST Use 1 time daily before breakfast   isosorbide mononitrate 60 MG 24 hr tablet Commonly known as:  IMDUR Take 1 tablet (60 mg total) by mouth daily.   levothyroxine 150 MCG tablet Commonly known as:  SYNTHROID, LEVOTHROID Take 1 tablet (150 mcg total) by mouth daily before breakfast.  losartan-hydrochlorothiazide 100-25 MG tablet Commonly known as:  HYZAAR Take 1 tablet by mouth daily.   metFORMIN 500 MG tablet Commonly known as:  GLUCOPHAGE Take 2 tablets (1,000 mg total) by mouth 2 (two) times daily with a meal.   nitroGLYCERIN 0.4 MG SL tablet Commonly known as:  NITROSTAT Place 1 tablet (0.4 mg total) under the tongue every 5 (five) minutes as needed for chest pain.   polyethylene glycol powder powder Commonly known as:  GLYCOLAX/MIRALAX TAKE 17 GRAMS WITH BY MOUTH DAILY.   TRUE METRIX METER Devi 1 each by Does not apply route daily before breakfast.   TRUEPLUS LANCETS  28G Misc 1 each by Does not apply route daily before breakfast.      Follow-up Information    Arnoldo Morale, MD. Schedule an appointment as soon as possible for a visit in 1 week(s).   Specialty:  Family Medicine Contact information: [place] [address] [phone]        Minus Breeding, MD. Schedule an appointment as soon as possible for a visit in 2 week(s).   Specialty:  Cardiology Why:  Please call to make follow-up appointment for possible stress test. Contact information: [address] [place] [address] [phone]          Allergies  Allergen Reactions  . Pollen Extract Other (See Comments)    Reaction unknown  . Tomato Itching    Reaction unknown  . Wool Alcohol [Lanolin] Itching    Reaction unknown    Consultations: Cardiology  Procedures/Studies: Echocardiogram  Subjective: Seen and examined at bedside.  Headache, dizziness, nausea vomiting chest pain shortness of breath.  Discharge Exam: Vitals:   02/03/17 0527 02/03/17 1321  BP: 133/85 (!) 150/61  Pulse: 66 64  Resp: 18 18  Temp: (!) 97.5 F (36.4 C) 98 F (36.7 C)  SpO2: 98% 100%   Vitals:   02/02/17 2140 02/02/17 2323 02/03/17 0527 02/03/17 1321  BP:  (!) 178/77 133/85 (!) 150/61  Pulse: 76 72 66 64  Resp:  18 18 18   Temp:  (!) 97.4 F (36.3 C) (!) 97.5 F (36.4 C) 98 F (36.7 C)  TempSrc:  Oral Oral Oral  SpO2: 100% 97% 98% 100%  Weight:  (!) 167.7 kg (369 lb 12.8 oz)    Height:  5\' 5"  (1.651 m)      General: Pt is alert, awake, not in acute distress Cardiovascular: RRR, S1/S2 +, no rubs, no gallops Respiratory: CTA bilaterally, no wheezing, no rhonchi Abdominal: Soft, NT, ND, bowel sounds + Extremities: no edema, no cyanosis    The results of significant diagnostics from this hospitalization (including imaging, microbiology, ancillary and laboratory) are listed below for reference.     Microbiology: No results found for this  or any previous visit (from the past 240 hour(s)).   Labs: BNP (last 3 results) Recent Labs    02/02/17 1746  BNP 09.3   Basic Metabolic Panel: Recent Labs  Lab 02/02/17 1746 02/02/17 2242  NA 138  --   K 3.7  --   CL 102  --   CO2 29  --   GLUCOSE 125*  --   BUN 12  --   CREATININE 0.82 0.65  CALCIUM 9.2  --    Liver Function Tests: No results for input(s): AST, ALT, ALKPHOS, BILITOT, PROT, ALBUMIN in the last 168 hours. No results for input(s): LIPASE, AMYLASE in the last 168 hours. No results for input(s): AMMONIA in  the last 168 hours. CBC: Recent Labs  Lab 02/02/17 1746 02/02/17 2242  WBC 7.7 6.9  HGB 13.2 12.7  HCT 42.4 39.3  MCV 94.0 93.1  PLT 312 306   Cardiac Enzymes: Recent Labs  Lab 02/02/17 2242 02/03/17 0506 02/03/17 1011  TROPONINI <0.03 <0.03 <0.03   BNP: Invalid input(s): POCBNP CBG: Recent Labs  Lab 02/02/17 2247 02/03/17 0733 02/03/17 1150  GLUCAP 168* 127* 109*   D-Dimer Recent Labs    02/02/17 1948  DDIMER 0.97*   Hgb A1c No results for input(s): HGBA1C in the last 72 hours. Lipid Profile No results for input(s): CHOL, HDL, LDLCALC, TRIG, CHOLHDL, LDLDIRECT in the last 72 hours. Thyroid function studies No results for input(s): TSH, T4TOTAL, T3FREE, THYROIDAB in the last 72 hours.  Invalid input(s): FREET3 Anemia work up No results for input(s): VITAMINB12, FOLATE, FERRITIN, TIBC, IRON, RETICCTPCT in the last 72 hours. Urinalysis    Component Value Date/Time   COLORURINE STRAW (A) 02/02/2017 1938   APPEARANCEUR CLEAR 02/02/2017 1938   LABSPEC 1.009 02/02/2017 1938   PHURINE 7.0 02/02/2017 1938   GLUCOSEU NEGATIVE 02/02/2017 1938   HGBUR NEGATIVE 02/02/2017 [place] NEGATIVE 02/02/2017 1938   KETONESUR 5 (A) 02/02/2017 1938   PROTEINUR NEGATIVE 02/02/2017 1938   UROBILINOGEN 1 04/15/2013 0958   NITRITE NEGATIVE 02/02/2017 1938   LEUKOCYTESUR NEGATIVE 02/02/2017 1938   Sepsis Labs Invalid input(s):  PROCALCITONIN,  WBC,  LACTICIDVEN Microbiology No results found for this or any previous visit (from the past 240 hour(s)).   Time coordinating discharge:  30 minutes  SIGNED:   Rosita Fire, MD  [facility] 02/03/2017, 1:45 PM  If 7PM-7AM, please contact night-coverage [email or web address] Password [email or web address]

## 2017-02-03 NOTE — Progress Notes (Signed)
Patient concerned about not being able to get RX filled. MD paged and felt it would be ok to still D/C and miss one day and get them filled on Wednesday. RN called [facility] on [address], they are open 24hours/day even tomorrow. Gave patient the resource and explained to her the cost of each medication. Patient verbalized understanding.

## 2017-02-03 NOTE — Progress Notes (Signed)
  Echocardiogram 2D Echocardiogram has been performed.  Dilraj Killgore G Markeem Noreen 02/03/2017, 2:06 PM

## 2017-02-03 NOTE — Progress Notes (Signed)
Taking over care of patient agree with previous RN assessment. Denies any needs at this time, will continue to monitor.  

## 2017-02-03 NOTE — Progress Notes (Signed)
PROGRESS NOTE    Tiffany Strickland  TZG:[id number] DOB: 1955-04-24 DOA: 02/02/2017 PCP: Arnoldo Morale, MD   Brief Narrative: 61 year old morbidly obese female with history of hypertension, type 2 diabetes diet-controlled, hyperlipidemia, hypothyroidism, sleep apnea presented with left-sided chest pain on and off for about a month.  Patient also with exertional dyspnea.  The pain radiation to the left arm.  Assessment & Plan:   Principal Problem:   Chest pain Active Problems:   HTN (hypertension)   Type 2 diabetes mellitus without complication, without long-term current use of insulin (HCC)   Hypothyroidism   OSA (obstructive sleep apnea)  -EKG and troponin unremarkable.  Patient reported chest pain with exertion as well.  Cardiology consulted.  Currently n.p.o. for possible cardiac a stress test.  Echocardiogram ordered.  No chest pain this morning. -CT scan of chest ruled out PE.  Patient does have pulmonary arterial hypertension. -Continue current medication including aspirin, Synthroid, losartan. -LDL 93, A1c 7.9.  DVT prophylaxis: Lovenox subcutaneous Code Status: Full code Family Communication: Patient's daughter at bedside Disposition Plan: Likely discharge home in 1-2 days    Consultants:   Cardiology  Procedures: Echocardiogram and possibly stress test depending on cardiologist evaluation Antimicrobials: None  Subjective: Seen and examined at bedside.  Reported exertional chest pain and shortness of breath.  No pain while resting.  Denies headache, dizziness, nausea vomiting.  Daughter at bedside.  Objective: Vitals:   02/02/17 2139 02/02/17 2140 02/02/17 2323 02/03/17 0527  BP: (!) 161/74  (!) 178/77 133/85  Pulse:  76 72 66  Resp:   18 18  Temp:   (!) 97.4 F (36.3 C) (!) 97.5 F (36.4 C)  TempSrc:   Oral Oral  SpO2:  100% 97% 98%  Weight:   (!) 167.7 kg (369 lb 12.8 oz)   Height:   5\' 5"  (1.651 m)    No intake or output data in the 24 hours ending  02/03/17 1132 Filed Weights   02/02/17 2323  Weight: (!) 167.7 kg (369 lb 12.8 oz)    Examination:  General exam: Appears calm and comfortable  Respiratory system: Clear to auscultation. Respiratory effort normal. No wheezing or crackle Cardiovascular system: S1 & S2 heard, RRR.  No pedal edema. Gastrointestinal system: Abdomen is nondistended, soft and nontender. Normal bowel sounds heard. Central nervous system: Alert and oriented. No focal neurological deficits. Extremities: Symmetric 5 x 5 power. Skin: No rashes, lesions or ulcers Psychiatry: Judgement and insight appear normal. Mood & affect appropriate.     Data Reviewed: I have personally reviewed following labs and imaging studies  CBC: Recent Labs  Lab 02/02/17 1746 02/02/17 2242  WBC 7.7 6.9  HGB 13.2 12.7  HCT 42.4 39.3  MCV 94.0 93.1  PLT 312 759   Basic Metabolic Panel: Recent Labs  Lab 02/02/17 1746 02/02/17 2242  NA 138  --   K 3.7  --   CL 102  --   CO2 29  --   GLUCOSE 125*  --   BUN 12  --   CREATININE 0.82 0.65  CALCIUM 9.2  --    GFR: Estimated Creatinine Clearance: 115.1 mL/min (by C-G formula based on SCr of 0.65 mg/dL). Liver Function Tests: No results for input(s): AST, ALT, ALKPHOS, BILITOT, PROT, ALBUMIN in the last 168 hours. No results for input(s): LIPASE, AMYLASE in the last 168 hours. No results for input(s): AMMONIA in the last 168 hours. Coagulation Profile: No results for input(s): INR, PROTIME in the last 168  hours. Cardiac Enzymes: Recent Labs  Lab 02/02/17 2242 02/03/17 0506 02/03/17 1011  TROPONINI <0.03 <0.03 <0.03   BNP (last 3 results) No results for input(s): PROBNP in the last 8760 hours. HbA1C: No results for input(s): HGBA1C in the last 72 hours. CBG: Recent Labs  Lab 02/02/17 2247 02/03/17 0733  GLUCAP 168* 127*   Lipid Profile: No results for input(s): CHOL, HDL, LDLCALC, TRIG, CHOLHDL, LDLDIRECT in the last 72 hours. Thyroid Function Tests: No  results for input(s): TSH, T4TOTAL, FREET4, T3FREE, THYROIDAB in the last 72 hours. Anemia Panel: No results for input(s): VITAMINB12, FOLATE, FERRITIN, TIBC, IRON, RETICCTPCT in the last 72 hours. Sepsis Labs: No results for input(s): PROCALCITON, LATICACIDVEN in the last 168 hours.  No results found for this or any previous visit (from the past 240 hour(s)).       Radiology Studies: Dg Chest 2 View  Result Date: 02/02/2017 CLINICAL DATA:  Chest pain EXAM: CHEST  2 VIEW COMPARISON:  12/05/2014 FINDINGS: Mild cardiomegaly. No confluent opacities, effusions or overt edema. No acute bony abnormality. IMPRESSION: Cardiomegaly.  No active disease. Electronically Signed   By: Rolm Baptise M.D.   On: 02/02/2017 18:01   Ct Angio Chest Pe W And/or Wo Contrast  Result Date: 02/02/2017 CLINICAL DATA:  Intermittent central chest pressure and shortness of breath with exertion since this morning. EXAM: CT ANGIOGRAPHY CHEST WITH CONTRAST TECHNIQUE: Multidetector CT imaging of the chest was performed using the standard protocol during bolus administration of intravenous contrast. Multiplanar CT image reconstructions and MIPs were obtained to evaluate the vascular anatomy. CONTRAST:  <See Chart> ISOVUE-370 IOPAMIDOL (ISOVUE-370) INJECTION 76% COMPARISON:  Chest x-ray today. FINDINGS: Cardiovascular: Heart is normal in size thoracic aorta is within normal. There is prominence of the main pulmonary artery segment. No evidence pulmonary embolism. Mediastinum/Nodes: No mediastinal or hilar adenopathy. Remaining mediastinal structures are within normal. Lungs/Pleura: Lungs are adequately inflated without focal airspace consolidation or effusion. Airways are within normal. Upper Abdomen: Within normal. Musculoskeletal: Minimal degenerate change of the spine. Review of the MIP images confirms the above findings. IMPRESSION: No evidence pulmonary embolism.  No acute cardiopulmonary disease. Prominence of the main  pulmonary artery which may be seen with pulmonary arterial hypertension. Electronically Signed   By: Marin Olp M.D.   On: 02/02/2017 21:26        Scheduled Meds: . aspirin EC  325 mg Oral Daily  . enoxaparin (LOVENOX) injection  40 mg Subcutaneous QHS  . hydrochlorothiazide  25 mg Oral Daily  . insulin aspart  0-9 Units Subcutaneous TID WC  . levothyroxine  137 mcg Oral QAC breakfast  . losartan  100 mg Oral Daily   Continuous Infusions:   LOS: 0 days    Careena Degraffenreid Tanna Furry, MD [facility] Pager [phone]  If 7PM-7AM, please contact night-coverage [email or web address] Password [facility] 02/03/2017, 11:32 AM

## 2017-02-05 ENCOUNTER — Other Ambulatory Visit: Payer: Self-pay | Admitting: Family Medicine

## 2017-02-05 DIAGNOSIS — Z1211 Encounter for screening for malignant neoplasm of colon: Secondary | ICD-10-CM

## 2017-02-05 MED FILL — NITROSTAT 0.4 MG TABLET SL: 0.4 | 30 days supply | Qty: 20 | Fill #0

## 2017-02-05 MED FILL — ISOSORBIDE MN ER 60 MG TAB: 60 | 30 days supply | Qty: 30 | Fill #0

## 2017-02-05 MED FILL — POLYETHYLENE GLYCOL 3350 PO: 28 days supply | Qty: 476 | Fill #0

## 2017-02-05 MED FILL — ?METFORMIN HCL 500MG TABLET: 500 | 15 days supply | Qty: 60 | Fill #0

## 2017-02-10 ENCOUNTER — Telehealth (HOSPITAL_COMMUNITY): Payer: Self-pay | Admitting: *Deleted

## 2017-02-10 NOTE — Telephone Encounter (Signed)
Patient given detailed instructions per Myocardial Perfusion Study Information Sheet for the test on 02/12/17 at 1245. Patient notified to arrive 15 minutes early and that it is imperative to arrive on time for appointment to keep from having the test rescheduled.  If you need to cancel or reschedule your appointment, please call the office within 24 hours of your appointment. . Patient verbalized understanding.Tiffany Strickland, Ranae Palms

## 2017-02-12 ENCOUNTER — Ambulatory Visit (HOSPITAL_COMMUNITY): Payer: No Typology Code available for payment source | Attending: Cardiovascular Disease

## 2017-02-12 ENCOUNTER — Other Ambulatory Visit: Payer: Self-pay | Admitting: Family Medicine

## 2017-02-12 DIAGNOSIS — Z794 Long term (current) use of insulin: Principal | ICD-10-CM

## 2017-02-12 DIAGNOSIS — E1169 Type 2 diabetes mellitus with other specified complication: Secondary | ICD-10-CM

## 2017-02-12 DIAGNOSIS — R079 Chest pain, unspecified: Secondary | ICD-10-CM

## 2017-02-12 MED ORDER — TECHNETIUM TC 99M TETROFOSMIN IV KIT
32.5000 | PACK | Freq: Once | INTRAVENOUS | Status: AC | PRN
  Administered 2017-02-12: 32.5 via INTRAVENOUS
  Filled 2017-02-12: qty 33

## 2017-02-12 MED ORDER — REGADENOSON 0.4 MG/5ML IV SOLN
0.4000 mg | Freq: Once | INTRAVENOUS | Status: AC
  Administered 2017-02-12: 0.4 mg via INTRAVENOUS

## 2017-02-13 ENCOUNTER — Ambulatory Visit (HOSPITAL_COMMUNITY): Payer: Self-pay

## 2017-02-13 ENCOUNTER — Ambulatory Visit (HOSPITAL_COMMUNITY): Payer: No Typology Code available for payment source | Attending: Cardiology

## 2017-02-13 ENCOUNTER — Inpatient Hospital Stay: Payer: Self-pay

## 2017-02-13 LAB — MYOCARDIAL PERFUSION IMAGING
CHL CUP NUCLEAR SSS: 2
CSEPPHR: 109 {beats}/min
LHR: 0.3
LV dias vol: 114 mL (ref 46–106)
LV sys vol: 40 mL
Rest HR: 77 {beats}/min
SDS: 2
SRS: 0
TID: 1.04

## 2017-02-13 MED ORDER — TECHNETIUM TC 99M TETROFOSMIN IV KIT
31.3000 | PACK | Freq: Once | INTRAVENOUS | Status: AC | PRN
  Administered 2017-02-13: 31.3 via INTRAVENOUS
  Filled 2017-02-13: qty 32

## 2017-02-17 ENCOUNTER — Encounter: Payer: Self-pay | Admitting: Family Medicine

## 2017-02-17 ENCOUNTER — Ambulatory Visit: Payer: No Typology Code available for payment source | Attending: Family Medicine | Admitting: Family Medicine

## 2017-02-17 VITALS — BP 162/84 | HR 72 | Temp 97.5°F | Ht 65.0 in | Wt 269.4 lb

## 2017-02-17 DIAGNOSIS — I10 Essential (primary) hypertension: Secondary | ICD-10-CM | POA: Insufficient documentation

## 2017-02-17 DIAGNOSIS — Z8673 Personal history of transient ischemic attack (TIA), and cerebral infarction without residual deficits: Secondary | ICD-10-CM | POA: Insufficient documentation

## 2017-02-17 DIAGNOSIS — E785 Hyperlipidemia, unspecified: Secondary | ICD-10-CM | POA: Insufficient documentation

## 2017-02-17 DIAGNOSIS — Z79890 Hormone replacement therapy (postmenopausal): Secondary | ICD-10-CM | POA: Insufficient documentation

## 2017-02-17 DIAGNOSIS — Z79899 Other long term (current) drug therapy: Secondary | ICD-10-CM | POA: Insufficient documentation

## 2017-02-17 DIAGNOSIS — E119 Type 2 diabetes mellitus without complications: Secondary | ICD-10-CM | POA: Insufficient documentation

## 2017-02-17 DIAGNOSIS — Z7984 Long term (current) use of oral hypoglycemic drugs: Secondary | ICD-10-CM | POA: Insufficient documentation

## 2017-02-17 DIAGNOSIS — Z7982 Long term (current) use of aspirin: Secondary | ICD-10-CM | POA: Insufficient documentation

## 2017-02-17 DIAGNOSIS — Z9114 Patient's other noncompliance with medication regimen: Secondary | ICD-10-CM | POA: Insufficient documentation

## 2017-02-17 DIAGNOSIS — R0789 Other chest pain: Secondary | ICD-10-CM | POA: Insufficient documentation

## 2017-02-17 DIAGNOSIS — E038 Other specified hypothyroidism: Secondary | ICD-10-CM

## 2017-02-17 LAB — GLUCOSE, POCT (MANUAL RESULT ENTRY): POC Glucose: 96 mg/dl (ref 70–99)

## 2017-02-17 NOTE — Patient Instructions (Signed)

## 2017-02-17 NOTE — Progress Notes (Signed)
Subjective:  [name], female    DOB: [birth year]  Age: 62 y.o. MRN: [id number]  CC: Hospitalization Follow-up and Chest Pain   HPI Tiffany Strickland is a 62 year old female with a history of hypertension, type 2 diabetes ( A1c of 7.9 which is up from 6.9), hypothyroidism here for a follow-up visit from hospitalization where she was managed for chest pain from 02/02/17 through 02/03/17.  She had presented with chest pains, troponins negative, EKG unrevealing.  Echocardiogram revealed EF of 60-65%, no regional wall motion abnormalities.  CT angiogram of the chest was negative for PE. She was seen by cardiology and her chest pain was thought to be noncardiac; isosorbide mononitrate and nitroglycerin were added to her regimen and she was discharged after her symptoms improved.  After discharge she had a nuclear stress test which was negative for ischemia.  Lexiscan stress test 02/12/17:  Nuclear stress EF: 65%.  No T wave inversion was noted during stress.  There was no ST segment deviation noted during stress.  This is a low risk study.  She presents today denying chest pain and states she has been under a lot of stress due to caring for her elderly mom in addition to some family losses which she thinks attributed to her chest pain.  She currently does not take isosorbide nitrate (due to side effects which she read about) which she was prescribed at discharge and her blood pressure is elevated today.  Past Medical History:  Diagnosis Date  . Colon polyp    adenomatous  . Diabetes mellitus without complication ([place])   . Hyperlipidemia   . Hypertension   . Stroke ([place])   . Thyroid disease     Past Surgical History:  Procedure Laterality Date  . ABDOMINAL HYSTERECTOMY    . BLADDER SUSPENSION    . EUS N/A 08/12/2013   Procedure: LOWER ENDOSCOPIC ULTRASOUND (EUS);  Surgeon: Milus Banister, MD;  Location: [facility];  Service: Endoscopy;  Laterality: N/A;  . KNEE  SURGERY    . TUBAL LIGATION      Allergies  Allergen Reactions  . Pollen Extract Other (See Comments)    Reaction unknown  . Tomato Itching    Reaction unknown  . Wool Alcohol [Lanolin] Itching    Reaction unknown     Outpatient Medications Prior to Visit  Medication Sig Dispense Refill  . aspirin (ASPIR-81) 81 MG EC tablet Take 1 tablet (81 mg total) by mouth daily. Swallow whole. 30 tablet 12  . Blood Glucose Monitoring Suppl (TRUE METRIX METER) DEVI 1 each by Does not apply route daily before breakfast. 1 Device 0  . glucose blood (TRUE METRIX BLOOD GLUCOSE TEST) test strip Use 1 time daily before breakfast 30 each 12  . levothyroxine (SYNTHROID, LEVOTHROID) 150 MCG tablet Take 1 tablet (150 mcg total) by mouth daily before breakfast. 30 tablet 3  . losartan-hydrochlorothiazide (HYZAAR) 100-25 MG tablet Take 1 tablet by mouth daily. 30 tablet 6  . metFORMIN (GLUCOPHAGE) 500 MG tablet Take 2 tablets (1,000 mg total) by mouth 2 (two) times daily with a meal. 60 tablet 0  . nitroGLYCERIN (NITROSTAT) 0.4 MG SL tablet Place 1 tablet (0.4 mg total) under the tongue every 5 (five) minutes as needed for chest pain. 20 tablet 0  . polyethylene glycol powder (GLYCOLAX/MIRALAX) powder TAKE 17 GRAMS WITH BY MOUTH DAILY. 500 g 3  . polyethylene glycol powder (GLYCOLAX/MIRALAX) powder TAKE 17 GRAMS WITH BY MOUTH DAILY. 510 g 0  .  TRUEPLUS LANCETS 28G MISC USE AS DIRECTED DAILY BEFORE BREAKFAST. 100 each 12  . isosorbide mononitrate (IMDUR) 60 MG 24 hr tablet Take 1 tablet (60 mg total) by mouth daily. (Patient not taking: Reported on 02/17/2017) 30 tablet 0   No facility-administered medications prior to visit.     ROS Review of Systems  Constitutional: Negative for activity change, appetite change and fatigue.  HENT: Negative for congestion, sinus pressure and sore throat.   Eyes: Negative for visual disturbance.  Respiratory: Negative for cough, chest tightness, shortness of breath and  wheezing.   Cardiovascular: Negative for chest pain and palpitations.  Gastrointestinal: Negative for abdominal distention, abdominal pain and constipation.  Endocrine: Negative for polydipsia.  Genitourinary: Negative for dysuria and frequency.  Musculoskeletal: Negative for arthralgias and back pain.  Skin: Negative for rash.  Neurological: Negative for tremors, light-headedness and numbness.  Hematological: Does not bruise/bleed easily.  Psychiatric/Behavioral: Negative for agitation and behavioral problems.    Objective:  BP (!) 162/84   Pulse 72   Temp (!) 97.5 F (36.4 C) (Oral)   Ht 5\' 5"  (1.651 m)   Wt 269 lb 6.4 oz (122.2 kg)   SpO2 98%   BMI 44.83 kg/m   BP/Weight 02/17/2017 02/03/2017 54/10/8117  Systolic BP 147 829 -  Diastolic BP 84 61 -  Wt. (Lbs) 269.4 - 369.8  BMI 44.83 - 61.54      Physical Exam  Constitutional: She is oriented to person, place, and time. She appears well-developed and well-nourished.  Cardiovascular: Normal rate, normal heart sounds and intact distal pulses.  No murmur heard. Pulmonary/Chest: Effort normal and breath sounds normal. She has no wheezes. She has no rales. She exhibits no tenderness.  Abdominal: Soft. Bowel sounds are normal. She exhibits no distension and no mass. There is no tenderness.  Musculoskeletal: Normal range of motion.  Neurological: She is alert and oriented to person, place, and time.  Skin: Skin is warm and dry.  Psychiatric: She has a normal mood and affect.     Lab Results  Component Value Date   HGBA1C 7.9 01/27/2017    Assessment & Plan:   1. Type 2 diabetes mellitus without complication, without long-term current use of insulin (HCC) Uncontrolled with A1c of 7.9 Not compliant with metformin despite discussing the risk and implications of her actions Diabetic diet, lifestyle modification - POCT glucose (manual entry)  2. Other specified hypothyroidism Uncontrolled Continue levothyroxine Will  adjust regimen if still uncontrolled at next visit.  3. Essential hypertension Uncontrolled due to noncompliance-not taking isosorbide Emphasized need to be compliant Low-sodium diet, DASH diet, weight loss  4. Atypical chest pain Symptomatic at this time Stress test was negative for any ischemia -  low risk study   No orders of the defined types were placed in this encounter.   Follow-up: Return for follow up of chronic medical conditions, keep previously scheduled appointment.   Arnoldo Morale MD

## 2017-03-04 ENCOUNTER — Encounter: Payer: Self-pay | Admitting: Cardiology

## 2017-03-04 ENCOUNTER — Ambulatory Visit (INDEPENDENT_AMBULATORY_CARE_PROVIDER_SITE_OTHER): Payer: No Typology Code available for payment source | Admitting: Cardiology

## 2017-03-04 VITALS — BP 132/78 | HR 80 | Ht 65.0 in | Wt 267.0 lb

## 2017-03-04 DIAGNOSIS — G4730 Sleep apnea, unspecified: Secondary | ICD-10-CM

## 2017-03-04 DIAGNOSIS — I10 Essential (primary) hypertension: Secondary | ICD-10-CM

## 2017-03-04 DIAGNOSIS — G4733 Obstructive sleep apnea (adult) (pediatric): Secondary | ICD-10-CM

## 2017-03-04 DIAGNOSIS — E119 Type 2 diabetes mellitus without complications: Secondary | ICD-10-CM

## 2017-03-04 DIAGNOSIS — R0789 Other chest pain: Secondary | ICD-10-CM

## 2017-03-04 NOTE — Assessment & Plan Note (Signed)
Controlled.  

## 2017-03-04 NOTE — Patient Instructions (Signed)
Medication Instructions:  Your physician recommends that you continue on your current medications as directed. Please refer to the Current Medication list given to you today.  Labwork: none  Testing/Procedures: Your physician has recommended that you have a sleep study. This test records several body functions during sleep, including: brain activity, eye movement, oxygen and carbon dioxide blood levels, heart rate and rhythm, breathing rate and rhythm, the flow of air through your mouth and nose, snoring, body muscle movements, and chest and belly movement. THE OFFICE WILL CALL YOU WITH DATE TIME AND LOCATION   Follow-Up: Your physician recommends that you schedule a follow-up appointment in: [place]   If you need a refill on your cardiac medications before your next appointment, please call your pharmacy.

## 2017-03-04 NOTE — Assessment & Plan Note (Signed)
She reportedly had a remote sleep study and was diagnosed with sleep apnea but didn't like the mask. I suggested we revisit this especially with her elevated PA pressure on echo and her co morbidities.

## 2017-03-04 NOTE — Assessment & Plan Note (Signed)
BMI 44 

## 2017-03-04 NOTE — Assessment & Plan Note (Signed)
02/03/17- atypical, echo showed normal LVF, Myoview low risk

## 2017-03-04 NOTE — Assessment & Plan Note (Signed)
Followed by PCP

## 2017-03-04 NOTE — Progress Notes (Signed)
3/57/0177 Tiffany Strickland   [birth year]  [id number]  Primary Physician Arnoldo Morale, MD Primary Cardiologist: Dr Percival Spanish  HPI:  62 y/o obese AA female with a history of a CVA in 2008, sleep apnea-(C-pap intolerant I the past), NIDDM, and HTN. She was admitted 02/03/17 with chest pain and we saw her in consult. She admitted she had been out of some of her medications and said she had trouble getting them refilled. She also states she was under a great deal of stress at that time. She ruled out for an MI and her echo showed normal LVF with PA pressures of 48 mmHg. An OP Myoview done 02/13/17 was low risk.  She is in the office today for follow up. She has been doing better. She is now eating better and is planning on joining a gym.    Current Outpatient Medications  Medication Sig Dispense Refill  . aspirin (ASPIR-81) 81 MG EC tablet Take 1 tablet (81 mg total) by mouth daily. Swallow whole. 30 tablet 12  . Blood Glucose Monitoring Suppl (TRUE METRIX METER) DEVI 1 each by Does not apply route daily before breakfast. 1 Device 0  . glucose blood (TRUE METRIX BLOOD GLUCOSE TEST) test strip Use 1 time daily before breakfast 30 each 12  . levothyroxine (SYNTHROID, LEVOTHROID) 150 MCG tablet Take 1 tablet (150 mcg total) by mouth daily before breakfast. 30 tablet 3  . losartan-hydrochlorothiazide (HYZAAR) 100-25 MG tablet Take 1 tablet by mouth daily. 30 tablet 6  . nitroGLYCERIN (NITROSTAT) 0.4 MG SL tablet Place 1 tablet (0.4 mg total) under the tongue every 5 (five) minutes as needed for chest pain. 20 tablet 0  . polyethylene glycol powder (GLYCOLAX/MIRALAX) powder TAKE 17 GRAMS WITH BY MOUTH DAILY. 500 g 3  . TRUEPLUS LANCETS 28G MISC USE AS DIRECTED DAILY BEFORE BREAKFAST. 100 each 12   No current facility-administered medications for this visit.     Allergies  Allergen Reactions  . Pollen Extract Other (See Comments)    Reaction unknown  . Tomato Itching    Reaction unknown  . Wool  Alcohol [Lanolin] Itching    Reaction unknown    Past Medical History:  Diagnosis Date  . Colon polyp    adenomatous  . Diabetes mellitus without complication ([place])   . Hyperlipidemia   . Hypertension   . Stroke ([place])   . Thyroid disease     Social History   Socioeconomic History  . Marital status: Single    Spouse name: Not on file  . Number of children: Not on file  . Years of education: Not on file  . Highest education level: Not on file  Social Needs  . Financial resource strain: Not on file  . Food insecurity - worry: Not on file  . Food insecurity - inability: Not on file  . Transportation needs - medical: Not on file  . Transportation needs - non-medical: Not on file  Occupational History  . Not on file  Tobacco Use  . Smoking status: Never Smoker  . Smokeless tobacco: Never Used  Substance and Sexual Activity  . Alcohol use: No  . Drug use: No  . Sexual activity: Yes    Birth control/protection: Surgical  Other Topics Concern  . Not on file  Social History Narrative  . Not on file     Family History  Problem Relation Age of Onset  . Hypertension Mother   . Heart disease Maternal Grandmother   . Cancer Maternal  Grandmother        ovarian  . Cancer Maternal Grandfather   . Hypertension Other   . Hyperlipidemia Other   . Cancer Other   . Sleep apnea Other   . Obesity Other      Review of Systems: General: negative for chills, fever, night sweats or weight changes.  Cardiovascular: negative for chest pain, dyspnea on exertion, edema, orthopnea, palpitations, paroxysmal nocturnal dyspnea or shortness of breath Dermatological: negative for rash Respiratory: negative for cough or wheezing Urologic: negative for hematuria Abdominal: negative for nausea, vomiting, diarrhea, bright red blood per rectum, melena, or hematemesis Neurologic: negative for visual changes, syncope, or dizziness All other systems reviewed and are otherwise negative except as  noted above.    Blood pressure 132/78, pulse 80, height 5\' 5"  (1.651 m), weight 267 lb (121.1 kg).  General appearance: alert, cooperative, no distress and morbidly obese Neck: no carotid bruit and no JVD Lungs: clear to auscultation bilaterally Heart: regular rate and rhythm Skin: Skin color, texture, turgor normal. No rashes or lesions Neurologic: Grossly normal   ASSESSMENT AND PLAN:   Chest pain 02/03/17- atypical, echo showed normal LVF, Myoview low risk  HTN (hypertension) Controlled  Non-insulin treated type 2 diabetes mellitus (HCC) Followed by PCP  Morbid obesity ([place]) BMI 44  OSA (obstructive sleep apnea) She reportedly had a remote sleep study and was diagnosed with sleep apnea but didn't like the mask. I suggested we revisit this especially with her elevated PA pressure on echo and her co morbidities.    PLAN  We discussed diet and exercise. I suggested a sleep study and she is agreeable. F/U with me in 6 months.   Kerin Ransom PA-C 03/04/2017 4:00 PM

## 2017-03-11 MED FILL — LEVOTHYROXINE 137 MCG TAB: 137 | 30 days supply | Qty: 30 | Fill #1 | Status: TO

## 2017-03-11 MED FILL — LOSARTAN-HCTZ 100-25 MG TAB: 100-25 | 30 days supply | Qty: 30 | Fill #1

## 2017-03-13 ENCOUNTER — Other Ambulatory Visit: Payer: Self-pay

## 2017-03-13 DIAGNOSIS — K5909 Other constipation: Secondary | ICD-10-CM

## 2017-03-13 MED ORDER — POLYETHYLENE GLYCOL 3350 17 GM/SCOOP PO POWD: ORAL | 3 refills | Status: DC

## 2017-03-13 MED FILL — POLYETHYLENE GLYCOL 3350 PO: 30 days supply | Qty: 510 | Fill #0

## 2017-03-14 ENCOUNTER — Telehealth: Payer: Self-pay | Admitting: Cardiology

## 2017-03-14 ENCOUNTER — Other Ambulatory Visit: Payer: Self-pay

## 2017-03-14 NOTE — Telephone Encounter (Signed)
Spoke with pt she states that on Wednesday 03-12-17 she was taking the bus and rushing from one bus to another and when she got to the next bus she sat down and felt a pain/burining feeling in her midchest area "over to the right side and down right arm-sometimes it feels like it burns" she states that this stopped when she was rested and was different than when she went to the ER/hospital. she states that she does not really know if this was "chest pain". she states that she does not have a BP cuff at home. She states that this feeling of burning/pain happened again Thursday night when she was sitting and lasted for over 5-10 minutes. This happens at least 2-5 times daily with no exertion, but it "mostly goes away" within a few seconds. But she states that this is now worrying her since it "keeps happening it is mostly just burning and her right hand fingers are tingly when this happens". She states that her mother is visiting right now and has brought her BP cuff with her, her BP @813am  on the right is 158/99 HR 72 and on the left 168/93 HR 71. She states that she does not know if this is normal for her. She tells me that she has a bottle of isosorbide, but she does not know if she is supposed to be taking this or not. She will bring it with her to her appt Monday to discuss.  I have scheduled an appt with Hao-PA on Monday, she states that if the chest pain happens again she will call back or if we are closed she will go to the er.

## 2017-03-17 ENCOUNTER — Ambulatory Visit (INDEPENDENT_AMBULATORY_CARE_PROVIDER_SITE_OTHER): Payer: No Typology Code available for payment source | Admitting: Physician Assistant

## 2017-03-17 ENCOUNTER — Encounter: Payer: Self-pay | Admitting: Physician Assistant

## 2017-03-17 VITALS — BP 166/90 | HR 67 | Ht 65.0 in | Wt 268.0 lb

## 2017-03-17 DIAGNOSIS — E119 Type 2 diabetes mellitus without complications: Secondary | ICD-10-CM

## 2017-03-17 DIAGNOSIS — I10 Essential (primary) hypertension: Secondary | ICD-10-CM

## 2017-03-17 DIAGNOSIS — R079 Chest pain, unspecified: Secondary | ICD-10-CM

## 2017-03-17 DIAGNOSIS — G4733 Obstructive sleep apnea (adult) (pediatric): Secondary | ICD-10-CM

## 2017-03-17 DIAGNOSIS — Z8673 Personal history of transient ischemic attack (TIA), and cerebral infarction without residual deficits: Secondary | ICD-10-CM

## 2017-03-17 MED ORDER — ISOSORBIDE MONONITRATE ER 30 MG PO TB24: 30.0000 mg | ORAL_TABLET | Freq: Every day | ORAL | 3 refills | Status: DC

## 2017-03-17 MED FILL — ?ISOSORBIDE MN 30 MG TAB SA: 30 | 30 days supply | Qty: 30 | Fill #0

## 2017-03-17 NOTE — Progress Notes (Addendum)
Cardiology Office Note    Date:  04/15/1937   ID:  Opal Dinning, DOB [birth year], MRN [id number]  PCP:  Charlott Rakes, MD  Cardiologist:  Dr. Percival Spanish   Chief Complaint  Patient presents with  . Follow-up    chest pain    History of Present Illness:  Tiffany Strickland is a 62 y.o. female with PMH of CVA 2008, OSA intolerant to CPAP, NIDDM, and HTN.  Patient was admitted in December 2018 with chest pain, she was out of medication in the time.  She was under a great deal of stress.  Echocardiogram showed normal LV function, PA peak pressure 48 mmHg.  Myoview performed on 02/13/2017 was low risk.  She was last seen by Kerin Ransom PA-C on 03/04/2017, her blood pressure is better controlled at that time.  Based on phone conversation obtained on 03/14/2017, patient was having chest pain.  According to patient, she was rushing to catch a bus when she started having right-sided chest pain down the right arm.  This was a different chest pain than what she experienced when she previously went to the hospital.  Otherwise, she has not noticed any exertional symptoms.  She did have a bottle of isosorbide at home, she is not sure how long she had it.  She also has not been taking amlodipine and HCTZ at home.  Given the negative Myoview recently, I wish to proceed with a trial of medical therapy.  I will add Imdur 30 mg daily to her medical regimen.  I will bring her back in a few weeks for reassessment, if chest pain worsens, we will discuss cardiac catheterization.   Past Medical History:  Diagnosis Date  . Colon polyp    adenomatous  . Diabetes mellitus without complication ([place])   . Hyperlipidemia   . Hypertension   . Stroke ([place])   . Thyroid disease     Past Surgical History:  Procedure Laterality Date  . ABDOMINAL HYSTERECTOMY    . BLADDER SUSPENSION    . EUS N/A 08/12/2013   Procedure: LOWER ENDOSCOPIC ULTRASOUND (EUS);  Surgeon: Milus Banister, MD;  Location: [facility];  Service:  Endoscopy;  Laterality: N/A;  . KNEE SURGERY    . TUBAL LIGATION      Current Medications: Outpatient Medications Prior to Visit  Medication Sig Dispense Refill  . aspirin (ASPIR-81) 81 MG EC tablet Take 1 tablet (81 mg total) by mouth daily. Swallow whole. 30 tablet 12  . Blood Glucose Monitoring Suppl (TRUE METRIX METER) DEVI 1 each by Does not apply route daily before breakfast. 1 Device 0  . glucose blood (TRUE METRIX BLOOD GLUCOSE TEST) test strip Use 1 time daily before breakfast 30 each 12  . levothyroxine (SYNTHROID, LEVOTHROID) 150 MCG tablet Take 1 tablet (150 mcg total) by mouth daily before breakfast. 30 tablet 3  . losartan-hydrochlorothiazide (HYZAAR) 100-25 MG tablet Take 1 tablet by mouth daily. 30 tablet 6  . nitroGLYCERIN (NITROSTAT) 0.4 MG SL tablet Place 1 tablet (0.4 mg total) under the tongue every 5 (five) minutes as needed for chest pain. 20 tablet 0  . polyethylene glycol powder (GLYCOLAX/MIRALAX) powder TAKE 17 GRAMS WITH BY MOUTH DAILY. 510 g 3  . TRUEPLUS LANCETS 28G MISC USE AS DIRECTED DAILY BEFORE BREAKFAST. 100 each 12  . amLODipine (NORVASC) 10 MG tablet Take 10 mg by mouth daily.    . hydrochlorothiazide (HYDRODIURIL) 25 MG tablet Take 25 mg by mouth daily.     No facility-administered  medications prior to visit.      Allergies:   Pollen extract; Tomato; and Wool alcohol [lanolin]   Social History   Socioeconomic History  . Marital status: Single    Spouse name: None  . Number of children: None  . Years of education: None  . Highest education level: None  Social Needs  . Financial resource strain: None  . Food insecurity - worry: None  . Food insecurity - inability: None  . Transportation needs - medical: None  . Transportation needs - non-medical: None  Occupational History  . None  Tobacco Use  . Smoking status: Never Smoker  . Smokeless tobacco: Never Used  Substance and Sexual Activity  . Alcohol use: No  . Drug use: No  . Sexual  activity: Yes    Birth control/protection: Surgical  Other Topics Concern  . None  Social History Narrative  . None     Family History:  The patient's family history includes Cancer in her maternal grandfather, maternal grandmother, and other; Heart disease in her maternal grandmother; Hyperlipidemia in her other; Hypertension in her mother and other; Obesity in her other; Sleep apnea in her other.   ROS:   Please see the history of present illness.    ROS All other systems reviewed and are negative.   PHYSICAL EXAM:   VS:  BP (!) 166/90   Pulse 67   Ht 5\' 5"  (1.651 m)   Wt 268 lb (121.6 kg)   BMI 44.60 kg/m    GEN: Well nourished, well developed, in no acute distress  HEENT: normal  Neck: no JVD, carotid bruits, or masses Cardiac: RRR; no murmurs, rubs, or gallops,no edema  Respiratory:  clear to auscultation bilaterally, normal work of breathing GI: soft, nontender, nondistended, + BS MS: no deformity or atrophy  Skin: warm and dry, no rash Neuro:  Alert and Oriented x 3, Strength and sensation are intact Psych: euthymic mood, full affect  Wt Readings from Last 3 Encounters:  03/17/17 268 lb (121.6 kg)  03/04/17 267 lb (121.1 kg)  02/17/17 269 lb 6.4 oz (122.2 kg)      Studies/Labs Reviewed:   EKG:  EKG is ordered today.  The ekg ordered today demonstrates normal sinus rhythm without significant ST-T wave changes  Recent Labs: 01/27/2017: ALT 18; TSH 5.360 02/02/2017: B Natriuretic Peptide 20.7; BUN 12; Creatinine, Ser 0.65; Hemoglobin 12.7; Platelets 306; Potassium 3.7; Sodium 138   Lipid Panel    Component Value Date/Time   CHOL 159 01/27/2017 0938   TRIG 80 01/27/2017 0938   HDL 50 01/27/2017 0938   CHOLHDL 3.2 01/27/2017 0938   CHOLHDL 3.1 11/08/2015 1210   VLDL 13 11/08/2015 1210   LDLCALC 93 01/27/2017 0938    Additional studies/ records that were reviewed today include:   Echo 02/03/2017 LV EF: 60% -   65% Study Conclusions  - Left  ventricle: The cavity size was normal. Systolic function was   normal. The estimated ejection fraction was in the range of 60%   to 65%. Wall motion was normal; there were no regional wall   motion abnormalities. There was an increased relative   contribution of atrial contraction to ventricular filling.   Doppler parameters are consistent with abnormal left ventricular   relaxation (grade 1 diastolic dysfunction). - Pulmonic valve: There was mild regurgitation. - Pulmonary arteries: PA peak pressure: 48 mm Hg (S).  Impressions:  - The right ventricular systolic pressure was increased consistent   with moderate  pulmonary hypertension.     Myoview 02/14/2016 Study Highlights     Nuclear stress EF: 65%.  No T wave inversion was noted during stress.  There was no ST segment deviation noted during stress.  This is a low risk study.   No reversible ischemia. LVEF 65% with normal wall motion. This is a low risk study.      ASSESSMENT:    1. Chest pain, unspecified type   2. Essential hypertension   3. Controlled type 2 diabetes mellitus without complication, without long-term current use of insulin ([place])   4. OSA (obstructive sleep apnea)   5. H/O: CVA (cerebrovascular accident)      PLAN:  In order of problems listed above:  1. Atypical chest pain: Does not seems to worsen with exertion.  Start on Imdur 30 mg daily.  Recent negative Myoview in January 2019.  I will bring the patient back in a few weeks for reassessment, if chest pain persists, may need to consider whether or not to proceed with cardiac catheterization.  2. Hypertension: Blood pressure elevated today, added Imdur 30 mg daily for antianginal purposes.  Some of this may be related to uncontrolled obstructive sleep apnea.  3. DM 2 managed by primary care providers.  4. History of CVA 2008: No recurrence.    Medication Adjustments/Labs and Tests Ordered: Current medicines are reviewed at length with  the patient today.  Concerns regarding medicines are outlined above.  Medication changes, Labs and Tests ordered today are listed in the Patient Instructions below. Patient Instructions  Medication Instructions:  DISCONTINUE HCTZ(Hydrochlolothiazide) DISCONTINUE Amlodipine  START Imdur 30 mg take 1 tablet once a day  Labwork: None   Testing/Procedures: None   Follow-Up: Your physician recommends that you schedule a follow-up appointment in: 2-3 weeks with Dr Shawnie Dapper or [facility] preferably Dr Percival Spanish  Any Other Special Instructions Will Be Listed Below (If Applicable). If you need a refill on your cardiac medications before your next appointment, please call your pharmacy.     Hilbert Corrigan, [place]  03/19/2017 7:18 AM    [address], [place], [place]  [id number] Phone: [phone]; Fax: [phone]

## 2017-03-17 NOTE — Patient Instructions (Addendum)
Medication Instructions:  DISCONTINUE HCTZ(Hydrochlolothiazide) DISCONTINUE Amlodipine  START Imdur 30 mg take 1 tablet once a day  Labwork: None   Testing/Procedures: None   Follow-Up: Your physician recommends that you schedule a follow-up appointment in: 2-3 weeks with Dr Shawnie Dapper or [facility] preferably Dr Percival Spanish  Any Other Special Instructions Will Be Listed Below (If Applicable). If you need a refill on your cardiac medications before your next appointment, please call your pharmacy.

## 2017-03-19 ENCOUNTER — Encounter: Payer: Self-pay | Admitting: Physician Assistant

## 2017-03-19 LAB — FECAL OCCULT BLOOD, IMMUNOCHEMICAL: Fecal Occult Bld: NEGATIVE

## 2017-03-21 ENCOUNTER — Encounter (HOSPITAL_BASED_OUTPATIENT_CLINIC_OR_DEPARTMENT_OTHER): Payer: No Typology Code available for payment source

## 2017-03-24 ENCOUNTER — Encounter (HOSPITAL_COMMUNITY): Payer: Self-pay | Admitting: *Deleted

## 2017-03-24 NOTE — Progress Notes (Signed)
Negative Fit Test results mailed to patient.  

## 2017-03-27 ENCOUNTER — Ambulatory Visit: Payer: No Typology Code available for payment source

## 2017-03-27 ENCOUNTER — Telehealth: Payer: Self-pay | Admitting: Family Medicine

## 2017-03-27 NOTE — Telephone Encounter (Signed)
Pt. Came to facility to drop off forms to be filled out by PCP. Once the forms have been filled out pt. Would like the form to be mailed to her. Please f/u

## 2017-03-30 ENCOUNTER — Ambulatory Visit (HOSPITAL_BASED_OUTPATIENT_CLINIC_OR_DEPARTMENT_OTHER): Payer: No Typology Code available for payment source | Attending: Cardiology | Admitting: Cardiovascular Disease

## 2017-03-30 DIAGNOSIS — G4736 Sleep related hypoventilation in conditions classified elsewhere: Secondary | ICD-10-CM | POA: Insufficient documentation

## 2017-03-30 DIAGNOSIS — Z6841 Body Mass Index (BMI) 40.0 and over, adult: Secondary | ICD-10-CM | POA: Insufficient documentation

## 2017-03-30 DIAGNOSIS — G4733 Obstructive sleep apnea (adult) (pediatric): Secondary | ICD-10-CM | POA: Insufficient documentation

## 2017-03-30 DIAGNOSIS — Z7982 Long term (current) use of aspirin: Secondary | ICD-10-CM | POA: Insufficient documentation

## 2017-03-30 DIAGNOSIS — Z7951 Long term (current) use of inhaled steroids: Secondary | ICD-10-CM | POA: Insufficient documentation

## 2017-03-30 DIAGNOSIS — Z79890 Hormone replacement therapy (postmenopausal): Secondary | ICD-10-CM | POA: Insufficient documentation

## 2017-03-30 DIAGNOSIS — Z79899 Other long term (current) drug therapy: Secondary | ICD-10-CM | POA: Insufficient documentation

## 2017-03-30 DIAGNOSIS — G4730 Sleep apnea, unspecified: Secondary | ICD-10-CM

## 2017-04-02 ENCOUNTER — Ambulatory Visit: Payer: Self-pay | Attending: Family Medicine | Admitting: Physician Assistant

## 2017-04-02 VITALS — BP 136/83 | HR 75 | Temp 97.7°F | Resp 16 | Ht 65.0 in | Wt 262.0 lb

## 2017-04-02 DIAGNOSIS — Z91018 Allergy to other foods: Secondary | ICD-10-CM | POA: Insufficient documentation

## 2017-04-02 DIAGNOSIS — E079 Disorder of thyroid, unspecified: Secondary | ICD-10-CM | POA: Insufficient documentation

## 2017-04-02 DIAGNOSIS — E1165 Type 2 diabetes mellitus with hyperglycemia: Secondary | ICD-10-CM | POA: Insufficient documentation

## 2017-04-02 DIAGNOSIS — I10 Essential (primary) hypertension: Secondary | ICD-10-CM | POA: Insufficient documentation

## 2017-04-02 DIAGNOSIS — Z79890 Hormone replacement therapy (postmenopausal): Secondary | ICD-10-CM | POA: Insufficient documentation

## 2017-04-02 DIAGNOSIS — Z86010 Personal history of colonic polyps: Secondary | ICD-10-CM | POA: Insufficient documentation

## 2017-04-02 DIAGNOSIS — Z8673 Personal history of transient ischemic attack (TIA), and cerebral infarction without residual deficits: Secondary | ICD-10-CM | POA: Insufficient documentation

## 2017-04-02 DIAGNOSIS — H6992 Unspecified Eustachian tube disorder, left ear: Secondary | ICD-10-CM

## 2017-04-02 DIAGNOSIS — Z7982 Long term (current) use of aspirin: Secondary | ICD-10-CM | POA: Insufficient documentation

## 2017-04-02 DIAGNOSIS — E785 Hyperlipidemia, unspecified: Secondary | ICD-10-CM | POA: Insufficient documentation

## 2017-04-02 DIAGNOSIS — H6982 Other specified disorders of Eustachian tube, left ear: Secondary | ICD-10-CM | POA: Insufficient documentation

## 2017-04-02 DIAGNOSIS — Z79899 Other long term (current) drug therapy: Secondary | ICD-10-CM | POA: Insufficient documentation

## 2017-04-02 DIAGNOSIS — Z794 Long term (current) use of insulin: Secondary | ICD-10-CM | POA: Insufficient documentation

## 2017-04-02 LAB — GLUCOSE, POCT (MANUAL RESULT ENTRY): POC GLUCOSE: 141 mg/dL — AB (ref 70–99)

## 2017-04-02 MED ORDER — PHENYLEPHRINE HCL 10 MG PO TABS: 10.0000 mg | ORAL_TABLET | ORAL | 1 refills | Status: DC | PRN

## 2017-04-02 MED ORDER — FLUTICASONE PROPIONATE 50 MCG/ACT NA SUSP: 2.0000 | Freq: Every day | NASAL | 6 refills | Status: DC

## 2017-04-02 MED FILL — FLUTICASONE PROP 50 MCG SPR: 50 | 30 days supply | Qty: 16 | Fill #0

## 2017-04-02 NOTE — Progress Notes (Signed)
Tiffany Strickland, is a 62 y.o. female  ZOX:[id number]  [id number]:[id number]  DOB - 08-Feb-1956  Subjective:  Chief Complaint and HPI: Tiffany Strickland is a 62 y.o. female here today to for L ear pain and pressure with sinus congestion.  No f/c.  No cough/runny nose.  Blood sugars under pretty good control. Recently working on vegetarian diet.     ROS:   Constitutional:  No f/c, No night sweats, No unexplained weight loss. EENT:  No vision changes, No blurry vision, No hearing changes. No other mouth, throat, or ear problems.  Respiratory: No cough, No SOB Cardiac: No CP, no palpitations GI:  No abd pain, No N/V/D. GU: No Urinary s/sx Musculoskeletal: No joint pain Neuro: No headache, no dizziness, no motor weakness.  Skin: No rash Endocrine:  No polydipsia. No polyuria.  Psych: Denies SI/HI  No problems updated.  ALLERGIES: Allergies  Allergen Reactions  . Pollen Extract Other (See Comments)    Reaction unknown  . Tomato Itching    Reaction unknown  . Wool Alcohol [Lanolin] Itching    Reaction unknown    PAST MEDICAL HISTORY: Past Medical History:  Diagnosis Date  . Colon polyp    adenomatous  . Diabetes mellitus without complication ([place])   . Hyperlipidemia   . Hypertension   . Stroke ([place])   . Thyroid disease     MEDICATIONS AT HOME: Prior to Admission medications   Medication Sig Start Date End Date Taking? Authorizing Provider  aspirin (ASPIR-81) 81 MG EC tablet Take 1 tablet (81 mg total) by mouth daily. Swallow whole. 11/08/15   Charlott Rakes, MD  Blood Glucose Monitoring Suppl (TRUE METRIX METER) DEVI 1 each by Does not apply route daily before breakfast. 11/08/15   Charlott Rakes, MD  fluticasone (FLONASE) 50 MCG/ACT nasal spray Place 2 sprays into both nostrils daily. 04/02/17   Argentina Donovan, PA-C  glucose blood (TRUE METRIX BLOOD GLUCOSE TEST) test strip Use 1 time daily before breakfast 11/08/15   Charlott Rakes, MD  isosorbide mononitrate (IMDUR) 30  MG 24 hr tablet Take 1 tablet (30 mg total) by mouth daily. 03/17/17 06/15/17  Almyra Deforest, PA  levothyroxine (SYNTHROID, LEVOTHROID) 150 MCG tablet Take 1 tablet (150 mcg total) by mouth daily before breakfast. 01/28/17   Charlott Rakes, MD  losartan-hydrochlorothiazide (HYZAAR) 100-25 MG tablet Take 1 tablet by mouth daily. 01/27/17   Charlott Rakes, MD  nitroGLYCERIN (NITROSTAT) 0.4 MG SL tablet Place 1 tablet (0.4 mg total) under the tongue every 5 (five) minutes as needed for chest pain. 02/03/17   Rosita Fire, MD  phenylephrine (SUDAFED PE) 10 MG TABS tablet Take 1 tablet (10 mg total) by mouth every 4 (four) hours as needed. 04/02/17   Argentina Donovan, PA-C  polyethylene glycol powder (GLYCOLAX/MIRALAX) powder TAKE 17 GRAMS WITH BY MOUTH DAILY. 03/13/17   Charlott Rakes, MD  TRUEPLUS LANCETS 28G MISC USE AS DIRECTED DAILY BEFORE BREAKFAST. 02/12/17   Charlott Rakes, MD     Objective:  EXAM:   Vitals:   04/02/17 1110  BP: 136/83  Pulse: 75  Resp: 16  Temp: 97.7 F (36.5 C)  TempSrc: Oral  SpO2: 96%  Weight: 262 lb (118.8 kg)  Height: 5\' 5"  (1.651 m)    General appearance : A&OX3. NAD. Non-toxic-appearing HEENT: Atraumatic and Normocephalic.  PERRLA. EOM intact.  TM full B, no infection/erythema. Mouth-MMM, post pharynx WNL w/o erythema, No PND. Neck: supple, no JVD. No cervical lymphadenopathy. No thyromegaly Chest/Lungs:  Breathing-non-labored,  Good air entry bilaterally, breath sounds normal without rales, rhonchi, or wheezing  CVS: S1 S2 regular, no murmurs, gallops, rubs  Extremities: Bilateral Lower Ext shows no edema, both legs are warm to touch with = pulse throughout Neurology:  CN II-XII grossly intact, Non focal.   Psych:  TP linear. J/I WNL. Normal speech. Appropriate eye contact and affect.  Skin:  No Rash  Data Review Lab Results  Component Value Date   HGBA1C 7.9 01/27/2017   HGBA1C 7.9 09/26/2016   HGBA1C 6.9 02/21/2016     Assessment & Plan    1. Eustachian tube disorder, left - fluticasone (FLONASE) 50 MCG/ACT nasal spray; Place 2 sprays into both nostrils daily.  Dispense: 16 g; Refill: 6 - phenylephrine (SUDAFED PE) 10 MG TABS tablet; Take 1 tablet (10 mg total) by mouth every 4 (four) hours as needed.  Dispense: 42 tablet; Refill: 1  2. Type 2 diabetes mellitus with hyperglycemia, with long-term current use of insulin ([place]) Continue to work on healthier diet/low carbs.  Continue current regimen.   - Glucose (CBG)   Patient have been counseled extensively about nutrition and exercise  Return in about 2 months (around 05/31/2017) for Dr Margarita Rana; f/up DM .  The patient was given clear instructions to go to ER or return to medical center if symptoms don't improve, worsen or new problems develop. The patient verbalized understanding. The patient was told to call to get lab results if they haven't heard anything in the next week.     Freeman Caldron, PA-C [facility] and [place], [place]   04/02/2017, 11:26 AMPatient ID: Tiffany Strickland, female   DOB: Jun 22, 1955, 62 y.o.   MRN: [id number]

## 2017-04-02 NOTE — Patient Instructions (Signed)
Eustachian Tube Dysfunction The eustachian tube connects the middle ear to the back of the nose. It regulates air pressure in the middle ear by allowing air to move between the ear and nose. It also helps to drain fluid from the middle ear space. When the eustachian tube does not function properly, air pressure, fluid, or both can build up in the middle ear. Eustachian tube dysfunction can affect one or both ears. What are the causes? This condition happens when the eustachian tube becomes blocked or cannot open normally. This may result from:  Ear infections.  Colds and other upper respiratory infections.  Allergies.  Irritation, such as from cigarette smoke or acid from the stomach coming up into the esophagus (gastroesophageal reflux).  Sudden changes in air pressure, such as from descending in an airplane.  Abnormal growths in the nose or throat, such as nasal polyps, tumors, or enlarged tissue at the back of the throat (adenoids).  What increases the risk? This condition may be more likely to develop in people who smoke and people who are overweight. Eustachian tube dysfunction may also be more likely to develop in children, especially children who have:  Certain birth defects of the mouth, such as cleft palate.  Large tonsils and adenoids.  What are the signs or symptoms? Symptoms of this condition may include:  A feeling of fullness in the ear.  Ear pain.  Clicking or popping noises in the ear.  Ringing in the ear.  Hearing loss.  Loss of balance.  Symptoms may get worse when the air pressure around you changes, such as when you travel to an area of high elevation or fly on an airplane. How is this diagnosed? This condition may be diagnosed based on:  Your symptoms.  A physical exam of your ear, nose, and throat.  Tests, such as those that measure: ? The movement of your eardrum (tympanogram). ? Your hearing (audiometry).  How is this treated? Treatment  depends on the cause and severity of your condition. If your symptoms are mild, you may be able to relieve your symptoms by moving air into ("popping") your ears. If you have symptoms of fluid in your ears, treatment may include:  Decongestants.  Antihistamines.  Nasal sprays or ear drops that contain medicines that reduce swelling (steroids).  In some cases, you may need to have a procedure to drain the fluid in your eardrum (myringotomy). In this procedure, a small tube is placed in the eardrum to:  Drain the fluid.  Restore the air in the middle ear space.  Follow these instructions at home:  Take over-the-counter and prescription medicines only as told by your health care provider.  Use techniques to help pop your ears as recommended by your health care provider. These may include: ? Chewing gum. ? Yawning. ? Frequent, forceful swallowing. ? Closing your mouth, holding your nose closed, and gently blowing as if you are trying to blow air out of your nose.  Do not do any of the following until your health care provider approves: ? Travel to high altitudes. ? Fly in airplanes. ? Work in a pressurized cabin or room. ? Scuba dive.  Keep your ears dry. Dry your ears completely after showering or bathing.  Do not smoke.  Keep all follow-up visits as told by your health care provider. This is important. Contact a health care provider if:  Your symptoms do not go away after treatment.  Your symptoms come back after treatment.  You are   unable to pop your ears.  You have: ? A fever. ? Pain in your ear. ? Pain in your head or neck. ? Fluid draining from your ear.  Your hearing suddenly changes.  You become very dizzy.  You lose your balance. This information is not intended to replace advice given to you by your health care provider. Make sure you discuss any questions you have with your health care provider. Document Released: 02/24/2015 Document Revised: 07/06/2015  Document Reviewed: 02/16/2014 Elsevier Interactive Patient Education  2018 Elsevier Inc.  

## 2017-04-06 NOTE — Procedures (Signed)
Patient Name: Tiffany Strickland, Tiffany Strickland Date: [birth year] Gender: Female D.O.B: 18-Aug-1955 Age (years): 61 Referring Provider: Kerin Ransom Height (inches): 65 Interpreting Physician: Shelva Majestic MD, ABSM Weight (lbs): 269 RPSGT: Baxter Flattery BMI: 65 MRN: [id number] Neck Size: 16.00  CLINICAL INFORMATION Sleep Study Type: NPSG  Indication for sleep study: Obesity, OSA, Snoring, Witnessed Apneas;   Epworth Sleepiness Score: 12  SLEEP STUDY TECHNIQUE As per the AASM Manual for the Scoring of Sleep and Associated Events v2.3 (April 2016) with a hypopnea requiring 4% desaturations.  The channels recorded and monitored were frontal, central and occipital EEG, electrooculogram (EOG), submentalis EMG (chin), nasal and oral airflow, thoracic and abdominal wall motion, anterior tibialis EMG, snore microphone, electrocardiogram, and pulse oximetry.  MEDICATIONS     aspirin (ASPIR-81) 81 MG EC tablet             Blood Glucose Monitoring Suppl (TRUE METRIX METER) DEVI         fluticasone (FLONASE) 50 MCG/ACT nasal spray         glucose blood (TRUE METRIX BLOOD GLUCOSE TEST) test strip         isosorbide mononitrate (IMDUR) 30 MG 24 hr tablet         levothyroxine (SYNTHROID, LEVOTHROID) 150 MCG tablet         losartan-hydrochlorothiazide (HYZAAR) 100-25 MG tablet         nitroGLYCERIN (NITROSTAT) 0.4 MG SL tablet         phenylephrine (SUDAFED PE) 10 MG TABS tablet         polyethylene glycol powder (GLYCOLAX/MIRALAX) powder         TRUEPLUS LANCETS 28G MISC     ??????? Medications self-administered by patient taken the night of the study : N/A  SLEEP ARCHITECTURE The study was initiated at 10:31:26 PM and ended at 4:33:06 AM.  Sleep onset time was 34.1 minutes and the sleep efficiency was 79.7%. The total sleep time was 288.1 minutes.  Stage REM latency was 93.0 minutes.  The patient spent 2.26% of the night in stage N1 sleep, 84.90% in stage N2 sleep, 0.00% in stage N3  and 12.84% in REM.  Alpha intrusion was absent.  Supine sleep was 40.26%.  RESPIRATORY PARAMETERS The overall apnea/hypopnea index (AHI) was 80.4 per hour. There were 256 total apneas, including 256 obstructive, 0 central and 0 mixed apneas. There were 130 hypopneas and 0 RERAs.  The AHI during Stage REM sleep was 76.2 per hour.  AHI while supine was 73.4 per hour.  The mean oxygen saturation was 84.07%. The minimum SpO2 during sleep was 67.00%.  Loud snoring was noted during this study.  CARDIAC DATA The 2 lead EKG demonstrated sinus rhythm. The mean heart rate was 67.69 beats per minute. Other EKG findings include: None.  LEG MOVEMENT DATA The total PLMS were 80 with a resulting PLMS index of 16.66. Associated arousal with leg movement index was 1.9 .  IMPRESSIONS - Severe obstructive sleep apnea occurred during this study (AHI 80.4/h). - No significant central sleep apnea occurred during this study (CAI = 0.0/h). - Severe oxygen desaturation to a nadir of 67%. - The patient snored with loud snoring volume. - No cardiac abnormalities were noted during this study. - Mild periodic limb movements of sleep occurred during the study. No significant associated arousals.  DIAGNOSIS - Obstructive Sleep Apnea (327.23 [G47.33 ICD-10]) - Nocturnal Hypoxemia (327.26 [G47.36 ICD-10])  RECOMMENDATIONS - Recommend therapeutic CPAP titration to determine optimal pressure required to  alleviate sleep disordered breathing. Apparently, the patient did not tolerate a trial of masks during this initial evaluation and a split-night study was not done. Consider sensitization with CPAP masks prior to CPAP titration study. - Effort should be made to optimize nasal and oral pharyngeal patency. - If patient is symptomatic with restless legs on CPAP therapy, consider pharmacotherapy with a PLMS index of 16.66. - Avoid alcohol, sedatives and other CNS depressants that may worsen sleep apnea and disrupt  normal sleep architecture. - Sleep hygiene should be reviewed to assess factors that may improve sleep quality. - Weight management and regular exercise should be initiated or continued if appropriate.  [Electronically signed] 04/06/2017 11:24 AM  Shelva Majestic MD, [facility], [place], American Board of Sleep Medicine   NPI: [id number] [place] PH: [phone]   FX: [phone] [place]

## 2017-04-07 ENCOUNTER — Other Ambulatory Visit: Payer: Self-pay | Admitting: Family Medicine

## 2017-04-09 ENCOUNTER — Telehealth: Payer: Self-pay | Admitting: *Deleted

## 2017-04-09 ENCOUNTER — Other Ambulatory Visit: Payer: Self-pay | Admitting: Cardiovascular Disease

## 2017-04-09 DIAGNOSIS — R0902 Hypoxemia: Secondary | ICD-10-CM

## 2017-04-09 DIAGNOSIS — I10 Essential (primary) hypertension: Secondary | ICD-10-CM

## 2017-04-09 DIAGNOSIS — G4733 Obstructive sleep apnea (adult) (pediatric): Secondary | ICD-10-CM

## 2017-04-09 NOTE — Telephone Encounter (Signed)
Patient informed of sleep study results and recommendations. She voiced verbal understanding of information given and has agreed with Dr . Evette Georges recommendations. CPAP titration with mask desensitizing on 04/13/17.

## 2017-04-09 NOTE — Telephone Encounter (Signed)
-----   Message from Troy Sine, MD sent at 04/06/2017 11:30 AM EST ----- Mariann Laster, please notify the patient that she has very severe sleep apnea and needs CPAP titration.  Apparently she did not tolerate trying the mass on during her initial evaluation.  She may require mask  desensitization prior to a CPAP titration trial

## 2017-04-10 NOTE — Progress Notes (Signed)
Cardiology Office Note   Date:  03/18/4268   ID:  Tiffany Strickland, DOB [birth year], MRN [id number]  PCP:  Charlott Rakes, MD  Cardiologist:   Minus Breeding, MD   Chief Complaint  Patient presents with  . Chest Pain      History of Present Illness: Tiffany Strickland is a 62 y.o. female who presents for follow up of chest pain.  I saw her in the hospital in Dec.  Echocardiogram showed normal LV function, PA peak pressure 48 mmHg.  Myoview performed on 02/13/2017 was low risk.  She was last seen by Kerin Ransom PA-C on 03/04/2017, her blood pressure was better controlled at that time.  She had difficult to control BP and was sent for a sleep study.   She had severe sleep apnea.  Since I last saw her she has done well.   She has completely changed her diet and she is starting to be more active.  The patient denies any new symptoms such as chest discomfort, neck or arm discomfort. There has been no new shortness of breath, PND or orthopnea. There have been no reported palpitations, presyncope or syncope.    Past Medical History:  Diagnosis Date  . Colon polyp    adenomatous  . Diabetes mellitus without complication ([place])   . Hyperlipidemia   . Hypertension   . Stroke ([place])   . Thyroid disease     Past Surgical History:  Procedure Laterality Date  . ABDOMINAL HYSTERECTOMY    . BLADDER SUSPENSION    . EUS N/A 08/12/2013   Procedure: LOWER ENDOSCOPIC ULTRASOUND (EUS);  Surgeon: Milus Banister, MD;  Location: [facility];  Service: Endoscopy;  Laterality: N/A;  . KNEE SURGERY    . TUBAL LIGATION       Current Outpatient Medications  Medication Sig Dispense Refill  . aspirin (ASPIR-81) 81 MG EC tablet Take 1 tablet (81 mg total) by mouth daily. Swallow whole. 30 tablet 12  . Blood Glucose Monitoring Suppl (TRUE METRIX METER) DEVI 1 each by Does not apply route daily before breakfast. 1 Device 0  . fluticasone (FLONASE) 50 MCG/ACT nasal spray Place 2 sprays into both nostrils daily.  16 g 6  . glucose blood (TRUE METRIX BLOOD GLUCOSE TEST) test strip Use 1 time daily before breakfast 30 each 12  . isosorbide mononitrate (IMDUR) 30 MG 24 hr tablet Take 1 tablet (30 mg total) by mouth daily. 30 tablet 3  . levothyroxine (SYNTHROID, LEVOTHROID) 137 MCG tablet TAKE 1 TABLET BY MOUTH DAILY BEFORE BREAKFAST. 30 tablet 1  . losartan-hydrochlorothiazide (HYZAAR) 100-25 MG tablet Take 1 tablet by mouth daily. 30 tablet 6  . nitroGLYCERIN (NITROSTAT) 0.4 MG SL tablet Place 1 tablet (0.4 mg total) under the tongue every 5 (five) minutes as needed for chest pain. 20 tablet 0  . polyethylene glycol powder (GLYCOLAX/MIRALAX) powder TAKE 17 GRAMS WITH BY MOUTH DAILY. 510 g 3  . TRUEPLUS LANCETS 28G MISC USE AS DIRECTED DAILY BEFORE BREAKFAST. 100 each 12   No current facility-administered medications for this visit.     Allergies:   Pollen extract; Tomato; and Wool alcohol [lanolin]    ROS:  Please see the history of present illness.   Otherwise, review of systems are positive for cold symptoms.   All other systems are reviewed and negative.    PHYSICAL EXAM: VS:  BP (!) 150/77   Pulse 61   Ht 5\' 5"  (1.651 m)   Wt 265 lb 12.8  oz (120.6 kg)   BMI 44.23 kg/m  , BMI Body mass index is 44.23 kg/m. GENERAL:  Well appearing NECK:  No jugular venous distention, waveform within normal limits, carotid upstroke brisk and symmetric, no bruits, no thyromegaly LUNGS:  Clear to auscultation bilaterally BACK:  No CVA tenderness CHEST:  Unremarkable HEART:  PMI not displaced or sustained,S1 and S2 within normal limits, no S3, no S4, no clicks, no rubs, no murmurs ABD:  Flat, positive bowel sounds normal in frequency in pitch, no bruits, no rebound, no guarding, no midline pulsatile mass, no hepatomegaly, no splenomegaly EXT:  2 plus pulses throughout, no edema, no cyanosis no clubbing   EKG:  EKG is not ordered today.    Recent Labs: 01/27/2017: ALT 18; TSH 5.360 02/02/2017: B  Natriuretic Peptide 20.7; BUN 12; Creatinine, Ser 0.65; Hemoglobin 12.7; Platelets 306; Potassium 3.7; Sodium 138    Lipid Panel    Component Value Date/Time   CHOL 159 01/27/2017 0938   TRIG 80 01/27/2017 0938   HDL 50 01/27/2017 0938   CHOLHDL 3.2 01/27/2017 0938   CHOLHDL 3.1 11/08/2015 1210   VLDL 13 11/08/2015 1210   LDLCALC 93 01/27/2017 0938      Wt Readings from Last 3 Encounters:  04/11/17 265 lb 12.8 oz (120.6 kg)  04/02/17 262 lb (118.8 kg)  03/17/17 268 lb (121.6 kg)      Other studies Reviewed: Additional studies/ records that were reviewed today include:    Sleep result. Review of the above records demonstrates:  Please see elsewhere in the note.     ASSESSMENT AND PLAN:    CHEST PAIN:  She is not having this any longer.  No change in therapy is indicated.  No further testing.   SLEEP APNEA:  She had claustrophobia and is going to have a desensitization study although we will try to figure out the timing of this because she has a cold currently.  HTN: Blood pressure slightly elevated but her mother who is an LPN has been taking her pressure and her readings have been fine at home.  No change in therapy.   Current medicines are reviewed at length with the patient today.  The patient does not have concerns regarding medicines.  The following changes have been made:  no change  Labs/ tests ordered today include: None No orders of the defined types were placed in this encounter.    Disposition:   FU with me 12 months.     Signed, Minus Breeding, MD  04/11/2017 10:57 AM    [facility]

## 2017-04-11 ENCOUNTER — Ambulatory Visit: Payer: No Typology Code available for payment source | Admitting: Cardiology

## 2017-04-11 ENCOUNTER — Encounter: Payer: Self-pay | Admitting: Cardiology

## 2017-04-11 VITALS — BP 150/77 | HR 61 | Ht 65.0 in | Wt 265.8 lb

## 2017-04-11 DIAGNOSIS — R079 Chest pain, unspecified: Secondary | ICD-10-CM

## 2017-04-11 DIAGNOSIS — I10 Essential (primary) hypertension: Secondary | ICD-10-CM

## 2017-04-11 DIAGNOSIS — G4733 Obstructive sleep apnea (adult) (pediatric): Secondary | ICD-10-CM

## 2017-04-11 NOTE — Patient Instructions (Signed)
Medication Instructions:  Continue current medications  If you need a refill on your cardiac medications before your next appointment, please call your pharmacy.  Labwork: None Ordered   Testing/Procedures: None Ordered  Follow-Up: Your physician wants you to follow-up in: 1 Year. You should receive a reminder letter in the mail two months in advance. If you do not receive a letter, please call our office [phone].    Thank you for choosing [facility]!!      

## 2017-04-13 ENCOUNTER — Ambulatory Visit (HOSPITAL_BASED_OUTPATIENT_CLINIC_OR_DEPARTMENT_OTHER): Payer: No Typology Code available for payment source

## 2017-04-17 MED FILL — LOSARTAN-HCTZ 100-25 MG TAB: 100-25 | 30 days supply | Qty: 30 | Fill #2

## 2017-04-17 MED FILL — ISOSORBIDE MN ER 30 MG TAB: 30 | 30 days supply | Qty: 30 | Fill #1

## 2017-04-17 MED FILL — ?LEVOTHYROXINE 137 MCG TAB: 137 | 30 days supply | Qty: 30 | Fill #0

## 2017-04-22 ENCOUNTER — Encounter (HOSPITAL_BASED_OUTPATIENT_CLINIC_OR_DEPARTMENT_OTHER): Payer: No Typology Code available for payment source

## 2017-05-05 ENCOUNTER — Encounter (HOSPITAL_BASED_OUTPATIENT_CLINIC_OR_DEPARTMENT_OTHER): Payer: No Typology Code available for payment source

## 2017-05-18 ENCOUNTER — Ambulatory Visit (HOSPITAL_BASED_OUTPATIENT_CLINIC_OR_DEPARTMENT_OTHER): Payer: No Typology Code available for payment source | Attending: Cardiovascular Disease

## 2017-05-23 ENCOUNTER — Encounter (HOSPITAL_COMMUNITY): Payer: Self-pay | Admitting: Emergency Medicine

## 2017-05-23 ENCOUNTER — Ambulatory Visit (HOSPITAL_COMMUNITY)
Admission: EM | Admit: 2017-05-23 | Discharge: 2017-05-23 | Disposition: A | Discharge status: Home / Self Care | Payer: No Typology Code available for payment source | Attending: Family Medicine | Admitting: Family Medicine

## 2017-05-23 DIAGNOSIS — N644 Mastodynia: Secondary | ICD-10-CM

## 2017-05-23 NOTE — ED Triage Notes (Signed)
Pt c/o R breast pain

## 2017-05-23 NOTE — ED Provider Notes (Signed)
[place]    CSN: [id number] Arrival date & time: 05/23/17  1854     History   Chief Complaint Chief Complaint  Patient presents with  . Breast Pain    HPI Tiffany Strickland is a 62 y.o. female.   62 yo female here for right breast pain that feels like her muscles and is worse when she moves her arms. Has not tried anything for it. Had mammogram this year and normal.     Past Medical History:  Diagnosis Date  . Colon polyp    adenomatous  . Diabetes mellitus without complication ([place])   . Hyperlipidemia   . Hypertension   . Stroke ([place])   . Thyroid disease     Patient Active Problem List   Diagnosis Date Noted  . OSA (obstructive sleep apnea) 02/03/2017  . Hypothyroidism 02/02/2017  . Chest pain 02/02/2017  . Morbid obesity ([place]) 01/27/2017  . Plantar fasciitis of left foot 11/08/2015  . Non-insulin treated type 2 diabetes mellitus ([place]) 06/08/2015  . Colon cancer screening 06/08/2015  . Bilateral leg edema 12/15/2014  . Blurry vision, bilateral 06/16/2014  . Breast cancer screening 06/16/2014  . Other specified hypothyroidism 06/16/2014  . Other seasonal allergic rhinitis 06/16/2014  . History of stroke 01/31/2014  . Essential hypertension 08/24/2013  . Nonspecific (abnormal) findings on radiological and other examination of gastrointestinal tract 08/12/2013  . Lateral epicondylitis of right elbow 07/21/2013  . Right shoulder pain 07/21/2013  . Sprain of wrist, right 07/21/2013  . Unspecified constipation 04/15/2013  . Rectal bleeding 04/15/2013  . HTN (hypertension) 01/15/2013  . Diabetes ([place]) 08/10/2012    Past Surgical History:  Procedure Laterality Date  . ABDOMINAL HYSTERECTOMY    . BLADDER SUSPENSION    . EUS N/A 08/12/2013   Procedure: LOWER ENDOSCOPIC ULTRASOUND (EUS);  Surgeon: Milus Banister, MD;  Location: [facility];  Service: Endoscopy;  Laterality: N/A;  . KNEE SURGERY    . TUBAL LIGATION      OB History    Gravida    6   Para  5   Term  5   Preterm      AB  1   Living  5     SAB  1   TAB      Ectopic      Multiple      Live Births               Home Medications    Prior to Admission medications   Medication Sig Start Date End Date Taking? Authorizing Provider  aspirin (ASPIR-81) 81 MG EC tablet Take 1 tablet (81 mg total) by mouth daily. Swallow whole. 11/08/15   Charlott Rakes, MD  Blood Glucose Monitoring Suppl (TRUE METRIX METER) DEVI 1 each by Does not apply route daily before breakfast. 11/08/15   Charlott Rakes, MD  fluticasone (FLONASE) 50 MCG/ACT nasal spray Place 2 sprays into both nostrils daily. 04/02/17   Argentina Donovan, PA-C  glucose blood (TRUE METRIX BLOOD GLUCOSE TEST) test strip Use 1 time daily before breakfast 11/08/15   Charlott Rakes, MD  isosorbide mononitrate (IMDUR) 30 MG 24 hr tablet Take 1 tablet (30 mg total) by mouth daily. 03/17/17 06/15/17  Almyra Deforest, PA  levothyroxine (SYNTHROID, LEVOTHROID) 137 MCG tablet TAKE 1 TABLET BY MOUTH DAILY BEFORE BREAKFAST. 04/07/17   Charlott Rakes, MD  losartan-hydrochlorothiazide (HYZAAR) 100-25 MG tablet Take 1 tablet by mouth daily. 01/27/17   Charlott Rakes, MD  nitroGLYCERIN (  NITROSTAT) 0.4 MG SL tablet Place 1 tablet (0.4 mg total) under the tongue every 5 (five) minutes as needed for chest pain. 02/03/17   Rosita Fire, MD  polyethylene glycol powder (GLYCOLAX/MIRALAX) powder TAKE 17 GRAMS WITH BY MOUTH DAILY. 03/13/17   Charlott Rakes, MD  TRUEPLUS LANCETS 28G MISC USE AS DIRECTED DAILY BEFORE BREAKFAST. 02/12/17   Charlott Rakes, MD    Family History Family History  Problem Relation Age of Onset  . Hypertension Mother   . Heart disease Maternal Grandmother   . Cancer Maternal Grandmother        ovarian  . Cancer Maternal Grandfather   . Hypertension Other   . Hyperlipidemia Other   . Cancer Other   . Sleep apnea Other   . Obesity Other     Social History Social History   Tobacco Use  .  Smoking status: Never Smoker  . Smokeless tobacco: Never Used  Substance Use Topics  . Alcohol use: No  . Drug use: No     Allergies   Pollen extract; Tomato; and Wool alcohol [lanolin]   Review of Systems Review of Systems  Constitutional: Negative for activity change and appetite change.  Respiratory: Negative for apnea and chest tightness.   Cardiovascular: Negative for chest pain and palpitations.  Gastrointestinal: Negative for abdominal distention and abdominal pain.  Musculoskeletal:       Breast pain     Physical Exam Triage Vital Signs ED Triage Vitals [05/23/17 1946]  Enc Vitals Group     BP (!) 183/81     Pulse Rate 81     Resp 20     Temp 98.6 F (37 C)     Temp Source Oral     SpO2 96 %     Weight      Height      Head Circumference      Peak Flow      Pain Score      Pain Loc      Pain Edu?      Excl. in [place]?    No data found.  Updated Vital Signs BP (!) 183/81 (BP Location: Left Arm) Comment: Pt sts that she took BP medication this morning  Pulse 81   Temp 98.6 F (37 C) (Oral)   Resp 20   SpO2 96%   Visual Acuity Right Eye Distance:   Left Eye Distance:   Bilateral Distance:    Right Eye Near:   Left Eye Near:    Bilateral Near:     Physical Exam  Constitutional: She is oriented to person, place, and time. She appears well-developed and well-nourished.  HENT:  Head: Normocephalic and atraumatic.  Eyes: Pupils are equal, round, and reactive to light. EOM are normal.  Neck: Normal range of motion. Neck supple.  Pulmonary/Chest: Effort normal. No respiratory distress.  Musculoskeletal: Normal range of motion. She exhibits no edema.  Right breast: no lumps or tenderness to palpation, no discharge from nipple. There is tenderness above the breast and in sternal area  Neurological: She is alert and oriented to person, place, and time.  Skin: Skin is warm and dry.  Psychiatric: She has a normal mood and affect. Her behavior is normal.       UC Treatments / Results  Labs (all labs ordered are listed, but only abnormal results are displayed) Labs Reviewed - No data to display  EKG None Radiology No results found.  Procedures Procedures (including critical care time)  Medications Ordered in UC Medications - No data to display   Initial Impression / Assessment and Plan / UC Course  I have reviewed the triage vital signs and the nursing notes.  Pertinent labs & imaging results that were available during my care of the patient were reviewed by me and considered in my medical decision making (see chart for details).     1. Right breast pain- most likely musculoskeletal. Advised ibuprofen. Will prescribe muscle relaxant.  Final Clinical Impressions(s) / UC Diagnoses   Final diagnoses:  None    ED Discharge Orders    None       Controlled Substance Prescriptions [place] Controlled Substance Registry consulted? Not Applicable   Dannielle Huh, DO 05/23/17 2004

## 2017-05-26 ENCOUNTER — Other Ambulatory Visit: Payer: Self-pay | Admitting: Family Medicine

## 2017-05-26 DIAGNOSIS — Z794 Long term (current) use of insulin: Principal | ICD-10-CM

## 2017-05-26 DIAGNOSIS — E1169 Type 2 diabetes mellitus with other specified complication: Secondary | ICD-10-CM

## 2017-05-26 MED FILL — ISOSORBIDE MN ER 30 MG TAB: 30 | 30 days supply | Qty: 30 | Fill #2

## 2017-05-26 MED FILL — TRUEplus LANCETS 28G MISC: 25 days supply | Qty: 100 | Fill #0

## 2017-05-26 MED FILL — LOSARTAN-HCTZ 100-25 MG TAB: 100-25 | 30 days supply | Qty: 30 | Fill #3

## 2017-05-26 MED FILL — POLYETHYLENE GLYCOL 3350 PO: 30 days supply | Qty: 510 | Fill #1

## 2017-05-26 MED FILL — ?LEVOTHYROXINE 137 MCG TAB: 137 | 30 days supply | Qty: 30 | Fill #1

## 2017-05-26 MED FILL — TRUE METRIX TEST STRIP: 30 days supply | Qty: 100 | Fill #0

## 2017-06-02 ENCOUNTER — Ambulatory Visit: Payer: No Typology Code available for payment source | Attending: Family Medicine | Admitting: Family Medicine

## 2017-06-02 ENCOUNTER — Encounter: Payer: Self-pay | Admitting: Family Medicine

## 2017-06-02 VITALS — BP 172/84 | HR 61 | Temp 97.4°F | Ht 65.0 in | Wt 265.4 lb

## 2017-06-02 DIAGNOSIS — I10 Essential (primary) hypertension: Secondary | ICD-10-CM

## 2017-06-02 DIAGNOSIS — Z7982 Long term (current) use of aspirin: Secondary | ICD-10-CM | POA: Insufficient documentation

## 2017-06-02 DIAGNOSIS — Z9851 Tubal ligation status: Secondary | ICD-10-CM | POA: Insufficient documentation

## 2017-06-02 DIAGNOSIS — E119 Type 2 diabetes mellitus without complications: Secondary | ICD-10-CM

## 2017-06-02 DIAGNOSIS — Z90710 Acquired absence of both cervix and uterus: Secondary | ICD-10-CM | POA: Insufficient documentation

## 2017-06-02 DIAGNOSIS — K5909 Other constipation: Secondary | ICD-10-CM

## 2017-06-02 DIAGNOSIS — Z79899 Other long term (current) drug therapy: Secondary | ICD-10-CM | POA: Insufficient documentation

## 2017-06-02 DIAGNOSIS — K5900 Constipation, unspecified: Secondary | ICD-10-CM | POA: Insufficient documentation

## 2017-06-02 DIAGNOSIS — J302 Other seasonal allergic rhinitis: Secondary | ICD-10-CM | POA: Insufficient documentation

## 2017-06-02 DIAGNOSIS — E038 Other specified hypothyroidism: Secondary | ICD-10-CM

## 2017-06-02 DIAGNOSIS — E785 Hyperlipidemia, unspecified: Secondary | ICD-10-CM | POA: Insufficient documentation

## 2017-06-02 DIAGNOSIS — Z8673 Personal history of transient ischemic attack (TIA), and cerebral infarction without residual deficits: Secondary | ICD-10-CM | POA: Insufficient documentation

## 2017-06-02 LAB — GLUCOSE, POCT (MANUAL RESULT ENTRY): POC GLUCOSE: 86 mg/dL (ref 70–99)

## 2017-06-02 LAB — POCT GLYCOSYLATED HEMOGLOBIN (HGB A1C): Hemoglobin A1C: 7.1

## 2017-06-02 MED ORDER — ISOSORBIDE MONONITRATE ER 30 MG PO TB24: 30.0000 mg | ORAL_TABLET | Freq: Every day | ORAL | 3 refills | Status: DC

## 2017-06-02 MED ORDER — POLYETHYLENE GLYCOL 3350 17 GM/SCOOP PO POWD: ORAL | 3 refills | Status: DC

## 2017-06-02 MED ORDER — TRUE METRIX METER DEVI: 1.0000 | Freq: Every day | 0 refills | Status: DC

## 2017-06-02 MED ORDER — LOSARTAN POTASSIUM-HCTZ 100-25 MG PO TABS: 1.0000 | ORAL_TABLET | Freq: Every day | ORAL | 6 refills | Status: DC

## 2017-06-02 MED ORDER — LEVOTHYROXINE SODIUM 137 MCG PO TABS: ORAL_TABLET | ORAL | 3 refills | Status: DC

## 2017-06-02 MED FILL — !TRUE METRIX BLOOD GLUCOSE: 1 days supply | Qty: 1 | Fill #0

## 2017-06-02 NOTE — Progress Notes (Signed)
Subjective:  [name], female    DOB: 06-01-1955  Age: 62 y.o. MRN: [id number]  CC: Diabetes   HPI Tiffany Strickland is a 62 year old female with a history of hypertension, type 2 diabetes ( A1c of 7.1), hypothyroidism here for a follow-up visit. Her A1c is 7.1 which is improved from 7.9 previously and she is currently on diet control as she has refused medications in the past and is not interested in getting on medications for control of diabetes mellitus.  She denies visual concerns, numbness in extremities. She has lost 4 pounds in the last 3 months and has become a vegetarian since that is hoping to improve on her lifestyle modifications.  Blood pressure is elevated and she endorses running out of her antihypertensive.  She denies chest pains or shortness of breath. Saw cardiology on 04/11/2017 for atypical chest pain and no regimen changes made.  She was also seen at the ED 10 days ago for right breast pain which was thought to be musculoskeletal as her  screening mammogram was normal. Tolerating her thyroid medication with no complaints of adverse effects. She suffers from seasonal allergies but informs me she uses a natural remedy with improvement in symptoms.  She has noted that she rotated and angry when she has to repeat herself over and over again was given instructions but denies anxiety or depression and declines therapy as she states she works with therapist.  Past Medical History:  Diagnosis Date  . Colon polyp    adenomatous  . Diabetes mellitus without complication ([place])   . Hyperlipidemia   . Hypertension   . Stroke ([place])   . Thyroid disease     Past Surgical History:  Procedure Laterality Date  . ABDOMINAL HYSTERECTOMY    . BLADDER SUSPENSION    . EUS N/A 08/12/2013   Procedure: LOWER ENDOSCOPIC ULTRASOUND (EUS);  Surgeon: Milus Banister, MD;  Location: [facility];  Service: Endoscopy;  Laterality: N/A;  . KNEE SURGERY    . TUBAL LIGATION        Outpatient Medications Prior to Visit  Medication Sig Dispense Refill  . aspirin (ASPIR-81) 81 MG EC tablet Take 1 tablet (81 mg total) by mouth daily. Swallow whole. 30 tablet 12  . glucose blood test strip Use 1 time daily before breakfast 100 each 12  . TRUEPLUS LANCETS 28G MISC USE AS DIRECTED DAILY BEFORE BREAKFAST. 100 each 12  . Blood Glucose Monitoring Suppl (TRUE METRIX METER) DEVI 1 each by Does not apply route daily before breakfast. 1 Device 0  . isosorbide mononitrate (IMDUR) 30 MG 24 hr tablet Take 1 tablet (30 mg total) by mouth daily. 30 tablet 3  . levothyroxine (SYNTHROID, LEVOTHROID) 137 MCG tablet TAKE 1 TABLET BY MOUTH DAILY BEFORE BREAKFAST. 30 tablet 1  . losartan-hydrochlorothiazide (HYZAAR) 100-25 MG tablet Take 1 tablet by mouth daily. 30 tablet 6  . fluticasone (FLONASE) 50 MCG/ACT nasal spray Place 2 sprays into both nostrils daily. (Patient not taking: Reported on 06/02/2017) 16 g 6  . nitroGLYCERIN (NITROSTAT) 0.4 MG SL tablet Place 1 tablet (0.4 mg total) under the tongue every 5 (five) minutes as needed for chest pain. (Patient not taking: Reported on 06/02/2017) 20 tablet 0  . polyethylene glycol powder (GLYCOLAX/MIRALAX) powder TAKE 17 GRAMS WITH BY MOUTH DAILY. (Patient not taking: Reported on 06/02/2017) 510 g 3   No facility-administered medications prior to visit.     ROS Review of Systems  Constitutional: Negative for activity  change, appetite change and fatigue.  HENT: Negative for congestion, sinus pressure and sore throat.   Eyes: Negative for visual disturbance.  Respiratory: Negative for cough, chest tightness, shortness of breath and wheezing.   Cardiovascular: Negative for chest pain and palpitations.  Gastrointestinal: Negative for abdominal distention, abdominal pain and constipation.  Endocrine: Negative for polydipsia.  Genitourinary: Negative for dysuria and frequency.  Musculoskeletal: Negative for arthralgias and back pain.  Skin:  Negative for rash.  Neurological: Negative for tremors, light-headedness and numbness.  Hematological: Does not bruise/bleed easily.  Psychiatric/Behavioral: Negative for agitation and behavioral problems.    Objective:  BP (!) 172/84   Pulse 61   Temp (!) 97.4 F (36.3 C) (Oral)   Ht 5\' 5"  (1.651 m)   Wt 265 lb 6.4 oz (120.4 kg)   SpO2 100%   BMI 44.16 kg/m   BP/Weight 06/02/2017 05/04/5571 03/15/252  Systolic BP 270 623 762  Diastolic BP 84 81 77  Wt. (Lbs) 265.4 - 265.8  BMI 44.16 - 44.23      Physical Exam  Constitutional: She is oriented to person, place, and time. She appears well-developed and well-nourished.  Cardiovascular: Normal rate, normal heart sounds and intact distal pulses.  No murmur heard. Pulmonary/Chest: Effort normal and breath sounds normal. She has no wheezes. She has no rales. She exhibits no tenderness.  Abdominal: Soft. Bowel sounds are normal. She exhibits no distension and no mass. There is no tenderness.  Musculoskeletal: Normal range of motion.  Neurological: She is alert and oriented to person, place, and time.  Skin: Skin is warm and dry.  Psychiatric: She has a normal mood and affect.    Lab Results  Component Value Date   HGBA1C 7.1 06/02/2017     Assessment & Plan:   1. Type 2 diabetes mellitus without complication, without long-term current use of insulin (HCC) Diet controlled with A1c of 7.1 - POCT glucose (manual entry) - POCT glycosylated hemoglobin (Hb A1C) - polyethylene glycol powder (GLYCOLAX/MIRALAX) powder; TAKE 17 GRAMS WITH BY MOUTH DAILY.  Dispense: 510 g; Refill: 3 - Blood Glucose Monitoring Suppl (TRUE METRIX METER) DEVI; 1 each by Does not apply route daily before breakfast.  Dispense: 1 Device; Refill: 0  2. Essential hypertension Uncontrolled due to running out of medications which I have refilled - losartan-hydrochlorothiazide (HYZAAR) 100-25 MG tablet; Take 1 tablet by mouth daily.  Dispense: 30 tablet;  Refill: 6  3. Other constipation - polyethylene glycol powder (GLYCOLAX/MIRALAX) powder; TAKE 17 GRAMS WITH BY MOUTH DAILY.  Dispense: 510 g; Refill: 3  4. Other seasonal allergic rhinitis Patient uses natural remedies  5. Other specified hypothyroidism - levothyroxine (SYNTHROID, LEVOTHROID) 137 MCG tablet; TAKE 1 TABLET BY MOUTH DAILY BEFORE BREAKFAST.  Dispense: 30 tablet; Refill: 3   Meds ordered this encounter  Medications  . losartan-hydrochlorothiazide (HYZAAR) 100-25 MG tablet    Sig: Take 1 tablet by mouth daily.    Dispense:  30 tablet    Refill:  6  . isosorbide mononitrate (IMDUR) 30 MG 24 hr tablet    Sig: Take 1 tablet (30 mg total) by mouth daily.    Dispense:  30 tablet    Refill:  3  . polyethylene glycol powder (GLYCOLAX/MIRALAX) powder    Sig: TAKE 17 GRAMS WITH BY MOUTH DAILY.    Dispense:  510 g    Refill:  3  . Blood Glucose Monitoring Suppl (TRUE METRIX METER) DEVI    Sig: 1 each by Does not apply  route daily before breakfast.    Dispense:  1 Device    Refill:  0  . levothyroxine (SYNTHROID, LEVOTHROID) 137 MCG tablet    Sig: TAKE 1 TABLET BY MOUTH DAILY BEFORE BREAKFAST.    Dispense:  30 tablet    Refill:  3    Follow-up: Return in about 3 months (around 09/01/2017) for Follow-up of chronic medical conditions.   Charlott Rakes MD

## 2017-07-08 MED FILL — POLYETHYLENE GLYCOL 3350 PO: 15 days supply | Qty: 238 | Fill #2

## 2017-07-08 MED FILL — ?LEVOTHYROXINE 137 MCG TAB: 137 | 30 days supply | Qty: 30 | Fill #0

## 2017-07-08 MED FILL — ISOSORBIDE MN ER 30 MG TAB: 30 | 30 days supply | Qty: 30 | Fill #3

## 2017-07-08 MED FILL — LOSARTAN-HCTZ 100-25 MG TAB: 100-25 | 30 days supply | Qty: 30 | Fill #4

## 2017-08-07 MED FILL — ISOSORBIDE MN ER 30 MG TAB: 30 | 30 days supply | Qty: 30 | Fill #0

## 2017-08-07 MED FILL — LOSARTAN-HCTZ 100-25 MG TAB: 100-25 | 30 days supply | Qty: 30 | Fill #5

## 2017-08-07 MED FILL — ?LEVOTHYROXINE 137 MCG TAB: 137 | 30 days supply | Qty: 30 | Fill #1

## 2017-08-08 ENCOUNTER — Ambulatory Visit: Payer: Self-pay | Attending: Family Medicine

## 2017-09-01 ENCOUNTER — Ambulatory Visit: Payer: No Typology Code available for payment source | Admitting: Family Medicine

## 2017-09-23 MED FILL — ?LEVOTHYROXINE 137 MCG TAB: 137 | 30 days supply | Qty: 30 | Fill #2

## 2017-09-23 MED FILL — LOSARTAN-HCTZ 100-25 MG TAB: 100-25 | 30 days supply | Qty: 30 | Fill #6

## 2017-09-23 MED FILL — ISOSORBIDE MN ER 30 MG TAB: 30 | 30 days supply | Qty: 30 | Fill #1

## 2017-09-24 ENCOUNTER — Ambulatory Visit: Payer: No Typology Code available for payment source

## 2017-10-07 ENCOUNTER — Ambulatory Visit: Payer: Self-pay | Attending: Family Medicine

## 2017-10-08 ENCOUNTER — Ambulatory Visit: Payer: Self-pay | Attending: Family Medicine | Admitting: Physician Assistant

## 2017-10-08 VITALS — BP 129/78 | HR 80 | Temp 97.4°F | Resp 18 | Ht 65.0 in | Wt 267.0 lb

## 2017-10-08 DIAGNOSIS — I10 Essential (primary) hypertension: Secondary | ICD-10-CM | POA: Insufficient documentation

## 2017-10-08 DIAGNOSIS — M25511 Pain in right shoulder: Secondary | ICD-10-CM | POA: Insufficient documentation

## 2017-10-08 DIAGNOSIS — Z7984 Long term (current) use of oral hypoglycemic drugs: Secondary | ICD-10-CM | POA: Insufficient documentation

## 2017-10-08 DIAGNOSIS — Z7982 Long term (current) use of aspirin: Secondary | ICD-10-CM | POA: Insufficient documentation

## 2017-10-08 DIAGNOSIS — Z8673 Personal history of transient ischemic attack (TIA), and cerebral infarction without residual deficits: Secondary | ICD-10-CM | POA: Insufficient documentation

## 2017-10-08 DIAGNOSIS — E079 Disorder of thyroid, unspecified: Secondary | ICD-10-CM | POA: Insufficient documentation

## 2017-10-08 DIAGNOSIS — M25512 Pain in left shoulder: Secondary | ICD-10-CM | POA: Insufficient documentation

## 2017-10-08 DIAGNOSIS — E119 Type 2 diabetes mellitus without complications: Secondary | ICD-10-CM | POA: Insufficient documentation

## 2017-10-08 DIAGNOSIS — Z79899 Other long term (current) drug therapy: Secondary | ICD-10-CM | POA: Insufficient documentation

## 2017-10-08 DIAGNOSIS — E785 Hyperlipidemia, unspecified: Secondary | ICD-10-CM | POA: Insufficient documentation

## 2017-10-08 LAB — POCT GLYCOSYLATED HEMOGLOBIN (HGB A1C): HBA1C, POC (CONTROLLED DIABETIC RANGE): 7.8 % — AB (ref 0.0–7.0)

## 2017-10-08 LAB — GLUCOSE, POCT (MANUAL RESULT ENTRY): POC Glucose: 179 mg/dl — AB (ref 70–99)

## 2017-10-08 MED ORDER — MELOXICAM 7.5 MG PO TABS: 7.5000 mg | ORAL_TABLET | Freq: Every day | ORAL | 0 refills | Status: DC

## 2017-10-08 MED ORDER — METHOCARBAMOL 500 MG PO TABS: 500.0000 mg | ORAL_TABLET | Freq: Three times a day (TID) | ORAL | 0 refills | Status: DC

## 2017-10-08 MED FILL — MELOXICAM 7.5 MG TABLET: 7.5 | 30 days supply | Qty: 30 | Fill #0

## 2017-10-08 MED FILL — TRUEplus LANCETS 28G MISC: 25 days supply | Qty: 100 | Fill #1

## 2017-10-08 MED FILL — METHOCARBAMOL 500 MG TABS: 500 | 30 days supply | Qty: 90 | Fill #0

## 2017-10-08 NOTE — Progress Notes (Signed)
[name], female   DOB: May 14, 1955, 62 y.o.   MRN: [id number]    Tiffany Strickland, is a 62 y.o. female  BOF:[id number]  DPO:[id number]  DOB - 03-Mar-1955  Subjective:  Chief Complaint and HPI: Tiffany Strickland is a 62 y.o. female here today for B shoulder pain and decreased ROM for about 3 months.  Sometimes has neck stiffness.  No paresthesias.  NKI.  Has trouble lifting her arms above her head or reaching to get things and this is progressively worse.  Ibuprofen has helped some.  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    Doesn't check blood sugars.  Says her A1C will be high bc she has been caring for her mom and eating a poor diet.  Does not want to take metformin or diabetes meds and is adamant about that.    ROS:   Constitutional:  No f/c, No night sweats, No unexplained weight loss. EENT:  No vision changes, No blurry vision, No hearing changes. No mouth, throat, or ear problems.  Respiratory: No cough, No SOB Cardiac: No CP, no palpitations GI:  No abd pain, No N/V/D. GU: No Urinary s/sx Musculoskeletal: B shoulder pain and decreased ROM Neuro: No headache, no dizziness, no motor weakness.  Skin: No rash Endocrine:  No polydipsia. No polyuria.  Psych: Denies SI/HI  No problems updated.  ALLERGIES: Allergies  Allergen Reactions  . Pollen Extract Other (See Comments)    Reaction unknown  . Tomato Itching    Reaction unknown  . Wool Alcohol [Lanolin] Itching    Reaction unknown    PAST MEDICAL HISTORY: Past Medical History:  Diagnosis Date  . Colon polyp    adenomatous  . Diabetes mellitus without complication ([place])   . Hyperlipidemia   . Hypertension   . Stroke ([place])   . Thyroid disease     MEDICATIONS AT HOME: Prior to Admission medications   Medication Sig Start Date End Date Taking? Authorizing Provider  aspirin (ASPIR-81) 81 MG EC tablet Take 1 tablet (81 mg total) by mouth daily. Swallow whole. 11/08/15   Charlott Rakes, MD  Blood Glucose  Monitoring Suppl (TRUE METRIX METER) DEVI 1 each by Does not apply route daily before breakfast. 06/02/17   Charlott Rakes, MD  fluticasone (FLONASE) 50 MCG/ACT nasal spray Place 2 sprays into both nostrils daily. Patient not taking: Reported on 06/02/2017 04/02/17   Argentina Donovan, PA-C  glucose blood test strip Use 1 time daily before breakfast 05/26/17   Charlott Rakes, MD  isosorbide mononitrate (IMDUR) 30 MG 24 hr tablet Take 1 tablet (30 mg total) by mouth daily. 06/02/17 08/31/17  Charlott Rakes, MD  levothyroxine (SYNTHROID, LEVOTHROID) 137 MCG tablet TAKE 1 TABLET BY MOUTH DAILY BEFORE BREAKFAST. 06/02/17   Charlott Rakes, MD  losartan-hydrochlorothiazide (HYZAAR) 100-25 MG tablet Take 1 tablet by mouth daily. 06/02/17   Charlott Rakes, MD  meloxicam (MOBIC) 7.5 MG tablet Take 1 tablet (7.5 mg total) by mouth daily. X 10 days then prn pain 10/08/17   Freeman Caldron M, PA-C  methocarbamol (ROBAXIN) 500 MG tablet Take 1 tablet (500 mg total) by mouth 3 (three) times daily. X 10 days then prn muscle spasm 10/08/17   Freeman Caldron M, PA-C  nitroGLYCERIN (NITROSTAT) 0.4 MG SL tablet Place 1 tablet (0.4 mg total) under the tongue every 5 (five) minutes as needed for chest pain. Patient not taking: Reported on 06/02/2017 02/03/17   Rosita Fire, MD  polyethylene glycol powder [facility]) powder TAKE  [place] BY MOUTH DAILY. 06/02/17   Charlott Rakes, MD  TRUEPLUS LANCETS 28G MISC USE AS DIRECTED DAILY BEFORE BREAKFAST. 02/12/17   Charlott Rakes, MD     Objective:  EXAM:   Vitals:   10/08/17 1141  BP: 129/78  Pulse: 80  Resp: 18  Temp: (!) 97.4 F (36.3 C)  TempSrc: Oral  SpO2: 97%  Weight: 267 lb (121.1 kg)  Height: 5\' 5"  (1.651 m)    General appearance : A&OX3. NAD. Non-toxic-appearing HEENT: Atraumatic and Normocephalic.  PERRLA. EOM intact.   C-spine-there is no TTP of the spinus processes.  There is trapezius spasm B.  Neck with ~90% ROM.   Neck: supple,  no JVD. No cervical lymphadenopathy. No thyromegaly Chest/Lungs:  Breathing-non-labored, Good air entry bilaterally, breath sounds normal without rales, rhonchi, or wheezing  CVS: S1 S2 regular, no murmurs, gallops, rubs  B shoulders-No TTP, no erythema or swelling of either shoulder.  Normal posterior ROM/rotation.  Unable to abduct >50% Extremities: Bilateral Lower Ext shows no edema, both legs are warm to touch with = pulse throughout Neurology:  CN II-XII grossly intact, Non focal.   Psych:  TP linear. J/I WNL. Normal speech. Appropriate eye contact and affect.  Skin:  No Rash  Data Review Lab Results  Component Value Date   HGBA1C 7.8 (A) 10/08/2017   HGBA1C 7.1 06/02/2017   HGBA1C 7.9 01/27/2017     Assessment & Plan   1. Acute pain of both shoulders With decreased ROM-concern for rotator cuff problem - meloxicam (MOBIC) 7.5 MG tablet; Take 1 tablet (7.5 mg total) by mouth daily. X 10 days then prn pain  Dispense: 30 tablet; Refill: 0 - methocarbamol (ROBAXIN) 500 MG tablet; Take 1 tablet (500 mg total) by mouth 3 (three) times daily. X 10 days then prn muscle spasm  Dispense: 90 tablet; Refill: 0 - Ambulatory referral to Orthopedic Surgery  2. Type 2 diabetes mellitus without complication, without long-term current use of insulin (HCC) Uncontrolled.  She says she has metformin at home but doesn't want to take it.  I have advised her to follow strict diet/eliminate sugars.  Check blood sugar twice daily and follow up with PCP within 1  Month.   - HgB A1c - Glucose (CBG)     Patient have been counseled extensively about nutrition and exercise  Return in about 1 month (around 11/08/2017) for Dr Margarita Rana for DM and blood work .  The patient was given clear instructions to go to ER or return to medical center if symptoms don't improve, worsen or new problems develop. The patient verbalized understanding. The patient was told to call to get lab results if they haven't heard  anything in the next week.     Freeman Caldron, PA-C [facility] [place], [place]   10/08/2017, 11:52 AM

## 2017-10-22 MED FILL — ISOSORBIDE MN ER 30 MG TAB: 30 | 30 days supply | Qty: 30 | Fill #2

## 2017-10-22 MED FILL — LOSARTAN-HCTZ 100-25 MG TAB: 100-25 | 30 days supply | Qty: 30 | Fill #0

## 2017-10-22 MED FILL — ?LEVOTHYROXINE 137 MCG TAB: 137 | 30 days supply | Qty: 30 | Fill #3

## 2017-10-24 ENCOUNTER — Ambulatory Visit (INDEPENDENT_AMBULATORY_CARE_PROVIDER_SITE_OTHER): Payer: Self-pay | Admitting: Family Medicine

## 2017-10-24 DIAGNOSIS — M79604 Pain in right leg: Secondary | ICD-10-CM

## 2017-10-24 DIAGNOSIS — M25511 Pain in right shoulder: Secondary | ICD-10-CM

## 2017-10-24 DIAGNOSIS — M25512 Pain in left shoulder: Secondary | ICD-10-CM

## 2017-10-24 DIAGNOSIS — R7989 Other specified abnormal findings of blood chemistry: Secondary | ICD-10-CM

## 2017-10-24 DIAGNOSIS — M542 Cervicalgia: Secondary | ICD-10-CM

## 2017-10-24 NOTE — Progress Notes (Signed)
Office Visit Note   Patient: Tiffany Strickland           Date of Birth: 1955/05/07           MRN: [id number] Visit Date: 10/24/2017 Requested by: Argentina Donovan, PA-C [place], [place] [address] PCP: Charlott Rakes, MD  Subjective: Chief Complaint  Patient presents with  . Right Shoulder - Pain  . Left Shoulder - Pain    HPI: She is seen at the request of Freeman Caldron for evaluation of bilateral shoulder pain, neck pain, and right leg pain.  A couple months ago she woke up one day with pain in both shoulders.  She could hardly raise them overhead.  She cannot recall any injury the day before.  She has used ibuprofen with minimal improvement.  She does not like to take medications.  She went to her PCP who gave her a muscle relaxant but she read the side effects and decided not to take it.  Denies any numbness or tingling in her arms.  She does have a history of diabetes poorly controlled with A1c of 7.8.  She also has hypothyroidism which was suboptimally controlled when checked last winter.  For the past 2 weeks she has had pain in the back of her right leg, from the distal hamstring to the proximal calf.  She sits a lot and it bothers her when sitting.  She has never had a blood clot.               ROS: Otherwise noncontributory  Objective: Vital Signs: There were no vitals taken for this visit.  Physical Exam:  Neck: Good range of motion, symmetric with negative Spurling's test.  Tender near the C7 spinous process.  Upper extremity strength and reflexes are normal. Shoulders: Bilateral pain with passive abduction and internal rotation with symmetric range of motion, no definite adhesive capsulitis.  Rotator cuff strength is 5/5 with no pain.  Tender in both subacromial spaces. Right leg: Both legs have 1+ pitting edema at the mid tibia.  Right leg is very tender between the heads of the gastrocnemius and behind the knee.  No palpable cords, Homans sign is  equivocal.  Imaging: None today.  Assessment & Plan: 1.  Neck and bilateral shoulder pain, etiology uncertain.  Could be early adhesive capsulitis related to diabetes. -We will try physical therapy.  If symptoms persist we will order x-rays and possibly MRI scan.  2.  Right leg pain with swelling, cannot rule out DVT -D-dimer, if positive then lower extremity Doppler.  If negative, then physical therapy.   Follow-Up Instructions: Return in about 4 weeks (around 11/21/2017), or if symptoms worsen or fail to improve.       Procedures: None today.   PMFS History: Patient Active Problem List   Diagnosis Date Noted  . OSA (obstructive sleep apnea) 02/03/2017  . Hypothyroidism 02/02/2017  . Chest pain 02/02/2017  . Morbid obesity ([place]) 01/27/2017  . Plantar fasciitis of left foot 11/08/2015  . Non-insulin treated type 2 diabetes mellitus ([place]) 06/08/2015  . Colon cancer screening 06/08/2015  . Bilateral leg edema 12/15/2014  . Blurry vision, bilateral 06/16/2014  . Breast cancer screening 06/16/2014  . Other specified hypothyroidism 06/16/2014  . Other seasonal allergic rhinitis 06/16/2014  . History of stroke 01/31/2014  . Essential hypertension 08/24/2013  . Nonspecific (abnormal) findings on radiological and other examination of gastrointestinal tract 08/12/2013  . Lateral epicondylitis of right elbow 07/21/2013  .  Right shoulder pain 07/21/2013  . Sprain of wrist, right 07/21/2013  . Unspecified constipation 04/15/2013  . Rectal bleeding 04/15/2013  . HTN (hypertension) 01/15/2013  . Diabetes ([place]) 08/10/2012   Past Medical History:  Diagnosis Date  . Colon polyp    adenomatous  . Diabetes mellitus without complication ([place])   . Hyperlipidemia   . Hypertension   . Stroke ([place])   . Thyroid disease     Family History  Problem Relation Age of Onset  . Hypertension Mother   . Heart disease Maternal Grandmother   . Cancer Maternal Grandmother        ovarian  .  Cancer Maternal Grandfather   . Hypertension Other   . Hyperlipidemia Other   . Cancer Other   . Sleep apnea Other   . Obesity Other     Past Surgical History:  Procedure Laterality Date  . ABDOMINAL HYSTERECTOMY    . BLADDER SUSPENSION    . EUS N/A 08/12/2013   Procedure: LOWER ENDOSCOPIC ULTRASOUND (EUS);  Surgeon: Milus Banister, MD;  Location: [facility];  Service: Endoscopy;  Laterality: N/A;  . KNEE SURGERY    . TUBAL LIGATION     Social History   Occupational History  . Not on file  Tobacco Use  . Smoking status: Never Smoker  . Smokeless tobacco: Never Used  Substance and Sexual Activity  . Alcohol use: No  . Drug use: No  . Sexual activity: Yes    Birth control/protection: Surgical

## 2017-10-25 ENCOUNTER — Encounter (INDEPENDENT_AMBULATORY_CARE_PROVIDER_SITE_OTHER): Payer: Self-pay | Admitting: Family Medicine

## 2017-10-25 LAB — D-DIMER, QUANTITATIVE (NOT AT ARMC): D DIMER QUANT: 1.25 ug{FEU}/mL — AB (ref <0.50)

## 2017-10-25 NOTE — Addendum Note (Signed)
Addended by: Hortencia Pilar on: 10/25/2017 10:48 AM   Modules accepted: Orders

## 2017-11-06 ENCOUNTER — Ambulatory Visit: Payer: Self-pay | Attending: Family Medicine | Admitting: Family Medicine

## 2017-11-06 ENCOUNTER — Encounter: Payer: Self-pay | Admitting: Family Medicine

## 2017-11-06 VITALS — BP 145/84 | HR 80 | Temp 98.0°F | Ht 65.0 in | Wt 271.6 lb

## 2017-11-06 DIAGNOSIS — Z791 Long term (current) use of non-steroidal anti-inflammatories (NSAID): Secondary | ICD-10-CM | POA: Insufficient documentation

## 2017-11-06 DIAGNOSIS — M25532 Pain in left wrist: Secondary | ICD-10-CM | POA: Insufficient documentation

## 2017-11-06 DIAGNOSIS — Z9889 Other specified postprocedural states: Secondary | ICD-10-CM | POA: Insufficient documentation

## 2017-11-06 DIAGNOSIS — E785 Hyperlipidemia, unspecified: Secondary | ICD-10-CM | POA: Insufficient documentation

## 2017-11-06 DIAGNOSIS — Z794 Long term (current) use of insulin: Secondary | ICD-10-CM | POA: Insufficient documentation

## 2017-11-06 DIAGNOSIS — M7501 Adhesive capsulitis of right shoulder: Secondary | ICD-10-CM | POA: Insufficient documentation

## 2017-11-06 DIAGNOSIS — M7502 Adhesive capsulitis of left shoulder: Secondary | ICD-10-CM | POA: Insufficient documentation

## 2017-11-06 DIAGNOSIS — Z1159 Encounter for screening for other viral diseases: Secondary | ICD-10-CM | POA: Insufficient documentation

## 2017-11-06 DIAGNOSIS — Z79899 Other long term (current) drug therapy: Secondary | ICD-10-CM | POA: Insufficient documentation

## 2017-11-06 DIAGNOSIS — I10 Essential (primary) hypertension: Secondary | ICD-10-CM | POA: Insufficient documentation

## 2017-11-06 DIAGNOSIS — Z8673 Personal history of transient ischemic attack (TIA), and cerebral infarction without residual deficits: Secondary | ICD-10-CM | POA: Insufficient documentation

## 2017-11-06 DIAGNOSIS — Z23 Encounter for immunization: Secondary | ICD-10-CM

## 2017-11-06 DIAGNOSIS — E038 Other specified hypothyroidism: Secondary | ICD-10-CM | POA: Insufficient documentation

## 2017-11-06 DIAGNOSIS — Z7982 Long term (current) use of aspirin: Secondary | ICD-10-CM | POA: Insufficient documentation

## 2017-11-06 DIAGNOSIS — E1165 Type 2 diabetes mellitus with hyperglycemia: Secondary | ICD-10-CM | POA: Insufficient documentation

## 2017-11-06 LAB — GLUCOSE, POCT (MANUAL RESULT ENTRY): POC Glucose: 131 mg/dl — AB (ref 70–99)

## 2017-11-06 MED ORDER — LOSARTAN POTASSIUM-HCTZ 100-25 MG PO TABS: 1.0000 | ORAL_TABLET | Freq: Every day | ORAL | 6 refills | Status: DC

## 2017-11-06 MED ORDER — METFORMIN HCL 500 MG PO TABS: 500.0000 mg | ORAL_TABLET | Freq: Two times a day (BID) | ORAL | 3 refills | Status: DC

## 2017-11-06 MED ORDER — ISOSORBIDE MONONITRATE ER 30 MG PO TB24: 30.0000 mg | ORAL_TABLET | Freq: Every day | ORAL | 3 refills | Status: DC

## 2017-11-06 MED FILL — ?METFORMIN HCL 500MG TABS: 500 | 30 days supply | Qty: 60 | Fill #0

## 2017-11-06 NOTE — Progress Notes (Signed)
Subjective:  [name], female    DOB: 1955/06/09  Age: 62 y.o. MRN: [id number]  CC: Diabetes   HPI Tiffany Strickland is a 62 year old female with a history of hypertension, type 2 diabetes ( A1c of 7.8), hypothyroidism here for a follow-up visit. Her A1c is 7.8 which has trended up from 7.1 and she continues to decline initiation of medications but wants to work on lifestyle changes. Denies visual concerns but is yet to have an annual eye exam. She denies neuropathy. She has been compliant with her antihypertensives but not with a low sodium diet or exercise. She is doing well on Levothyroxine but last TSH was elevated. She was seen by Orthopedics for bilateral shoulder pains diagnosed as early adhesive capsulitis and has an upcoming appointment with PT. A few days ago she lifted her grand daughter with her car seat and ever since has had moderate left wrist pain which does not radiate and is not associated  with swelling.  Past Medical History:  Diagnosis Date  . Colon polyp    adenomatous  . Diabetes mellitus without complication ([place])   . Hyperlipidemia   . Hypertension   . Stroke ([place])   . Thyroid disease     Past Surgical History:  Procedure Laterality Date  . ABDOMINAL HYSTERECTOMY    . BLADDER SUSPENSION    . EUS N/A 08/12/2013   Procedure: LOWER ENDOSCOPIC ULTRASOUND (EUS);  Surgeon: Milus Banister, MD;  Location: [facility];  Service: Endoscopy;  Laterality: N/A;  . KNEE SURGERY    . TUBAL LIGATION      Allergies  Allergen Reactions  . Pollen Extract Other (See Comments)    Reaction unknown  . Tomato Itching    Reaction unknown     Outpatient Medications Prior to Visit  Medication Sig Dispense Refill  . aspirin (ASPIR-81) 81 MG EC tablet Take 1 tablet (81 mg total) by mouth daily. Swallow whole. 30 tablet 12  . Blood Glucose Monitoring Suppl (TRUE METRIX METER) DEVI 1 each by Does not apply route daily before breakfast. 1 Device 0  . glucose  blood test strip Use 1 time daily before breakfast 100 each 12  . levothyroxine (SYNTHROID, LEVOTHROID) 137 MCG tablet TAKE 1 TABLET BY MOUTH DAILY BEFORE BREAKFAST. 30 tablet 3  . nitroGLYCERIN (NITROSTAT) 0.4 MG SL tablet Place 1 tablet (0.4 mg total) under the tongue every 5 (five) minutes as needed for chest pain. 20 tablet 0  . polyethylene glycol powder (GLYCOLAX/MIRALAX) powder TAKE 17 GRAMS WITH BY MOUTH DAILY. 510 g 3  . TRUEPLUS LANCETS 28G MISC USE AS DIRECTED DAILY BEFORE BREAKFAST. 100 each 12  . losartan-hydrochlorothiazide (HYZAAR) 100-25 MG tablet Take 1 tablet by mouth daily. 30 tablet 6  . fluticasone (FLONASE) 50 MCG/ACT nasal spray Place 2 sprays into both nostrils daily. (Patient not taking: Reported on 06/02/2017) 16 g 6  . meloxicam (MOBIC) 7.5 MG tablet Take 1 tablet (7.5 mg total) by mouth daily. X 10 days then prn pain (Patient not taking: Reported on 10/24/2017) 30 tablet 0  . methocarbamol (ROBAXIN) 500 MG tablet Take 1 tablet (500 mg total) by mouth 3 (three) times daily. X 10 days then prn muscle spasm (Patient not taking: Reported on 10/24/2017) 90 tablet 0  . isosorbide mononitrate (IMDUR) 30 MG 24 hr tablet Take 1 tablet (30 mg total) by mouth daily. 30 tablet 3   No facility-administered medications prior to visit.     ROS Review of Systems  Constitutional: Negative for activity change, appetite change and fatigue.  HENT: Negative for congestion, sinus pressure and sore throat.   Eyes: Negative for visual disturbance.  Respiratory: Negative for cough, chest tightness, shortness of breath and wheezing.   Cardiovascular: Negative for chest pain and palpitations.  Gastrointestinal: Negative for abdominal distention, abdominal pain and constipation.  Endocrine: Negative for polydipsia.  Genitourinary: Negative for dysuria and frequency.  Musculoskeletal:       See hpi  Skin: Negative for rash.  Neurological: Negative for tremors, light-headedness and numbness.    Hematological: Does not bruise/bleed easily.  Psychiatric/Behavioral: Negative for agitation and behavioral problems.    Objective:  BP (!) 145/84   Pulse 80   Temp 98 F (36.7 C) (Oral)   Ht '5\' 5"'  (1.651 m)   Wt 271 lb 9.6 oz (123.2 kg)   SpO2 96%   BMI 45.20 kg/m   BP/Weight 11/06/2017 10/08/2017 5/32/9924  Systolic BP 268 341 962  Diastolic BP 84 78 84  Wt. (Lbs) 271.6 267 265.4  BMI 45.2 44.43 44.16      Physical Exam  Constitutional: She is oriented to person, place, and time. She appears well-developed and well-nourished.  Cardiovascular: Normal rate, normal heart sounds and intact distal pulses.  No murmur heard. Pulmonary/Chest: Effort normal and breath sounds normal. She has no wheezes. She has no rales. She exhibits no tenderness.  Abdominal: Soft. Bowel sounds are normal. She exhibits no distension and no mass. There is no tenderness.  Musculoskeletal: Normal range of motion. She exhibits edema (3+ b/l non pitting edema).  Normal appearance of left wrist, no tenderness to palpation and dorsum of volar aspect. Able to make a fist bilaterally. Right wrist is normal  Neurological: She is alert and oriented to person, place, and time.  Skin: Skin is warm and dry.  Psychiatric: She has a normal mood and affect.    CMP Latest Ref Rng & Units 02/02/2017 02/02/2017 01/27/2017  Glucose 65 - 99 mg/dL - 125(H) 139(H)  BUN 6 - 20 mg/dL - 12 8  Creatinine 0.44 - 1.00 mg/dL 0.65 0.82 0.76  Sodium 135 - 145 mmol/L - 138 140  Potassium 3.5 - 5.1 mmol/L - 3.7 4.0  Chloride 101 - 111 mmol/L - 102 100  CO2 22 - 32 mmol/L - 29 29  Calcium 8.9 - 10.3 mg/dL - 9.2 8.9  Total Protein 6.0 - 8.5 g/dL - - 6.7  Total Bilirubin 0.0 - 1.2 mg/dL - - 0.3  Alkaline Phos 39 - 117 IU/L - - 89  AST 0 - 40 IU/L - - 15  ALT 0 - 32 IU/L - - 18    Lipid Panel     Component Value Date/Time   CHOL 159 01/27/2017 0938   TRIG 80 01/27/2017 0938   HDL 50 01/27/2017 0938   CHOLHDL 3.2  01/27/2017 0938   CHOLHDL 3.1 11/08/2015 1210   VLDL 13 11/08/2015 1210   LDLCALC 93 01/27/2017 0938    Lab Results  Component Value Date   TSH 5.360 (H) 01/27/2017    Assessment & Plan:   1. Type 2 diabetes mellitus with hyperglycemia, with long-term current use of insulin (HCC) Uncontrolled with A1c which is 7.8  Initiated Metformin - she is adamant about not wanting to be on medications despite discussing complication of untreated Diabetes with her Counseled on Diabetic diet, my plate method, 229 minutes of moderate intensity exercise/week Keep blood sugar logs with fasting goals of 80-120 mg/dl, random of  less than 180 and in the event of sugars less than 60 mg/dl or greater than 400 mg/dl please notify the clinic ASAP. It is recommended that you undergo annual eye exams and annual foot exams. Pneumonia vaccine is recommended. - POCT glucose (manual entry) - metFORMIN (GLUCOPHAGE) 500 MG tablet; Take 1 tablet (500 mg total) by mouth 2 (two) times daily with a meal.  Dispense: 60 tablet; Refill: 3 - Microalbumin/Creatinine Ratio, Urine - Ambulatory referral to Ophthalmology  2. Essential hypertension Slightly elevated BP. No regimen change today Counseled on blood pressure goal of less than 130/80, low-sodium, DASH diet, medication compliance, 150 minutes of moderate intensity exercise per week. Discussed medication compliance, adverse effects. - CMP14+EGFR - Lipid panel - losartan-hydrochlorothiazide (HYZAAR) 100-25 MG tablet; Take 1 tablet by mouth daily.  Dispense: 30 tablet; Refill: 6 - isosorbide mononitrate (IMDUR) 30 MG 24 hr tablet; Take 1 tablet (30 mg total) by mouth daily.  Dispense: 30 tablet; Refill: 3  3. Left wrist pain Likely wrist sprain Apply ice NSAIDS prn  4. Adhesive capsulitis of both shoulders Has upcoming PT appointment  5. Other specified hypothyroidism Will adjust Levothyroxine dose accordingly after Thyroid function - T4, free - TSH  6.  Screening for viral disease - Hepatitis c antibody (reflex)   Meds ordered this encounter  Medications  . metFORMIN (GLUCOPHAGE) 500 MG tablet    Sig: Take 1 tablet (500 mg total) by mouth 2 (two) times daily with a meal.    Dispense:  60 tablet    Refill:  3  . losartan-hydrochlorothiazide (HYZAAR) 100-25 MG tablet    Sig: Take 1 tablet by mouth daily.    Dispense:  30 tablet    Refill:  6  . isosorbide mononitrate (IMDUR) 30 MG 24 hr tablet    Sig: Take 1 tablet (30 mg total) by mouth daily.    Dispense:  30 tablet    Refill:  3    Follow-up: Return in about 3 months (around 02/05/2018) for Follow-up of chronic medical conditions.   Charlott Rakes MD

## 2017-11-06 NOTE — Progress Notes (Signed)
Patient is having pain in left hand.

## 2017-11-07 ENCOUNTER — Encounter: Payer: Self-pay | Admitting: Family Medicine

## 2017-11-07 LAB — CMP14+EGFR
ALBUMIN: 4.2 g/dL (ref 3.6–4.8)
ALT: 16 IU/L (ref 0–32)
AST: 13 IU/L (ref 0–40)
Albumin/Globulin Ratio: 1.6 (ref 1.2–2.2)
Alkaline Phosphatase: 90 IU/L (ref 39–117)
BUN/Creatinine Ratio: 16 (ref 12–28)
BUN: 12 mg/dL (ref 8–27)
Bilirubin Total: 0.3 mg/dL (ref 0.0–1.2)
CHLORIDE: 99 mmol/L (ref 96–106)
CO2: 27 mmol/L (ref 20–29)
Calcium: 9.8 mg/dL (ref 8.7–10.3)
Creatinine, Ser: 0.76 mg/dL (ref 0.57–1.00)
GFR calc non Af Amer: 83 mL/min/{1.73_m2} (ref >59)
GFR, EST AFRICAN AMERICAN: 96 mL/min/{1.73_m2} (ref >59)
GLOBULIN, TOTAL: 2.7 g/dL (ref 1.5–4.5)
Glucose: 115 mg/dL — ABNORMAL HIGH (ref 65–99)
Potassium: 4.2 mmol/L (ref 3.5–5.2)
SODIUM: 144 mmol/L (ref 134–144)
TOTAL PROTEIN: 6.9 g/dL (ref 6.0–8.5)

## 2017-11-07 LAB — HEPATITIS C ANTIBODY (REFLEX): HCV Ab: <0.1 s/co ratio (ref 0.0–0.9)

## 2017-11-07 LAB — MICROALBUMIN / CREATININE URINE RATIO
CREATININE, UR: 36.9 mg/dL
MICROALB/CREAT RATIO: 19.2 mg/g{creat} (ref 0.0–30.0)
MICROALBUM., U, RANDOM: 7.1 ug/mL

## 2017-11-07 LAB — LIPID PANEL
Chol/HDL Ratio: 3 ratio (ref 0.0–4.4)
Cholesterol, Total: 163 mg/dL (ref 100–199)
HDL: 54 mg/dL (ref >39)
LDL CALC: 94 mg/dL (ref 0–99)
Triglycerides: 74 mg/dL (ref 0–149)
VLDL Cholesterol Cal: 15 mg/dL (ref 5–40)

## 2017-11-07 LAB — HCV COMMENT:

## 2017-11-07 LAB — T4, FREE: Free T4: 1.39 ng/dL (ref 0.82–1.77)

## 2017-11-07 LAB — TSH: TSH: 4.74 u[IU]/mL — ABNORMAL HIGH (ref 0.450–4.500)

## 2017-11-07 MED ORDER — LEVOTHYROXINE SODIUM 150 MCG PO TABS: ORAL_TABLET | ORAL | 3 refills | Status: DC

## 2017-11-07 MED FILL — ?LEVOTHYROXINE 150 MCG TAB: 150 | 30 days supply | Qty: 30 | Fill #0

## 2017-11-10 ENCOUNTER — Encounter: Payer: Self-pay | Admitting: Physical Therapy

## 2017-11-10 ENCOUNTER — Ambulatory Visit: Payer: Self-pay | Attending: Family Medicine | Admitting: Physical Therapy

## 2017-11-10 DIAGNOSIS — M25512 Pain in left shoulder: Secondary | ICD-10-CM | POA: Insufficient documentation

## 2017-11-10 DIAGNOSIS — M25511 Pain in right shoulder: Secondary | ICD-10-CM | POA: Insufficient documentation

## 2017-11-10 DIAGNOSIS — M542 Cervicalgia: Secondary | ICD-10-CM | POA: Insufficient documentation

## 2017-11-10 NOTE — Therapy (Signed)
[place], [place], [address] Phone: [phone]   Fax:  [phone]  Physical Therapy Evaluation  Patient Details  Name: Tiffany Strickland MRN: [id number] Date of Birth: 02/26/1955 Referring Provider (PT): Eunice Blase, MD   Encounter Date: 11/10/2017  PT End of Session - 11/10/17 1207    Visit Number  1    Number of Visits  12    Date for PT Re-Evaluation  12/22/17    PT Start Time  1100    PT Stop Time  1145    PT Time Calculation (min)  45 min    Activity Tolerance  Patient tolerated treatment well    Behavior During Therapy  [facility] for tasks assessed/performed       Past Medical History:  Diagnosis Date  . Colon polyp    adenomatous  . Diabetes mellitus without complication ([place])   . Hyperlipidemia   . Hypertension   . Stroke ([place])   . Thyroid disease     Past Surgical History:  Procedure Laterality Date  . ABDOMINAL HYSTERECTOMY    . BLADDER SUSPENSION    . EUS N/A 08/12/2013   Procedure: LOWER ENDOSCOPIC ULTRASOUND (EUS);  Surgeon: Milus Banister, MD;  Location: [facility];  Service: Endoscopy;  Laterality: N/A;  . KNEE SURGERY    . TUBAL LIGATION      There were no vitals filed for this visit.   Subjective Assessment - 11/10/17 1107    Subjective  Pt relays she woke up about 2 months ago with neck and shoulder pain. She feels that heat is better and cold is worse.     Pertinent History  PMH: DM,hypothyroid,CVA 2005, she denies any deficits form this, HTN    Diagnostic tests  no recent imaging    Patient Stated Goals  get rid of the pain    Currently in Pain?  Yes    Pain Score  9     Pain Location  Shoulder    Pain Orientation  Right;Left    Pain Descriptors / Indicators  Aching    Pain Type  Acute pain    Pain Radiating Towards  Lt hand    Pain Onset  More than a month ago    Pain Frequency  Constant    Aggravating Factors   reaching, UE dressing    Pain Relieving Factors   heat    Multiple Pain Sites  Yes    Pain Score  5    Pain Location  Neck    Pain Orientation  Other (Comment)   center   Pain Descriptors / Indicators  Aching;Tightness    Pain Type  Acute pain;Chronic pain    Pain Radiating Towards  upper back    Pain Frequency  Intermittent    Aggravating Factors   looking down for a long time    Pain Relieving Factors  movement, neck circles         [facility] PT Assessment - 11/10/17 0001      Assessment   Medical Diagnosis  neck and bilat shoulder pain    Referring Provider (PT)  Eunice Blase, MD    Onset Date/Surgical Date  --   2 months   Hand Dominance  Right    Next MD Visit  4 weeks    Prior Therapy  PT post CVA, and for knee      Balance Screen   Has the patient fallen in the past 6 months  No      Home Film/video editor residence      Prior Function   Level of Independence  Independent with basic ADLs    Vocation  Unemployed    Leisure  bowling, seeing grankids      Cognition   Overall Cognitive Status  Within Functional Limits for tasks assessed      Observation/Other Assessments   Focus on Therapeutic Outcomes (FOTO)   62% limited      Sensation   Light Touch  Appears Intact      ROM / Strength   AROM / PROM / Strength  AROM;PROM;Strength      AROM   Overall AROM Comments  shoulders flex 55, abd 60, ER to C6, IR to SI jt    AROM Assessment Site  Shoulder;Cervical    Cervical Flexion  50    Cervical Extension  40    Cervical - Right Side Bend  30    Cervical - Left Side Bend  40    Cervical - Right Rotation  45    Cervical - Left Rotation  42      PROM   Overall PROM Comments  WFL, no hard end feel      Strength   Overall Strength Comments  bilat shoulder strength flexion, abd, ER 4/5, IR 4+/5       Flexibility   Soft Tissue Assessment /Muscle Length  --   tight UT, cerv P.S     Special Tests   Other special tests  +spurlings, +impingment signs/tests                 Objective measurements completed on examination: See above findings.      [place] Adult PT Treatment/Exercise - 11/10/17 0001      Self-Care   Self-Care  Other Self-Care Comments   HEP review     Modalities   Modalities  Moist Heat      Moist Heat Therapy   Number Minutes Moist Heat  10 Minutes    Moist Heat Location  Shoulder;Cervical             PT Education - 11/10/17 1144    Education Details  HEP, POC, heat    Person(s) Educated  Patient    Methods  Explanation;Demonstration;Verbal cues;Handout    Comprehension  Verbalized understanding;Need further instruction          PT Long Term Goals - 11/10/17 1212      PT LONG TERM GOAL #1   Title  Pt will be I and compliant with HEP. 6 weeks 12/22/2017    Status  New      PT LONG TERM GOAL #2   Title  Pt will improve bilat shoulder and cervical AROM to [facility]. 6 weeks 12/22/2017    Status  New      PT LONG TERM GOAL #3   Title  Pt will improve bilat shoulder strength to at least 4+/5 to improve function. 6 weeks 12/22/2017    Status  New      PT LONG TERM GOAL #4   Title  Pt will improve FOTO to less than 40% limited. 6 weeks 12/22/2017    Status  New      PT LONG TERM GOAL #5   Title  Pt will decrease pain to overall less than 3/10 with usual activity. 6 weeks 12/22/2017    Status  New  Plan - 11/10/17 1208    Clinical Impression Statement  Pt presents with bilateral shoulder and neck pain and stiffness. She did not have signs of frozen shoulder but did have impingment signs. She has decreased abillity for reaching and functional use of her arms as well as difficulty scanning enviornment. She will benefit from skilled PT to address her deficits.     History and Personal Factors relevant to plan of care:  PMH: DM,hypothyroid,CVA,HTN    Clinical Presentation  Evolving    Clinical Presentation due to:  widespread pain, irritable symptoms    Clinical Decision Making   Moderate    Rehab Potential  Good    Clinical Impairments Affecting Rehab Potential  potential financial restrictions    PT Frequency  2x / week    PT Duration  6 weeks    PT Treatment/Interventions  Cryotherapy;Electrical Stimulation;Iontophoresis 4mg /ml Dexamethasone;Moist Heat;Traction;Ultrasound;Therapeutic exercise;Therapeutic activities;Neuromuscular re-education;Patient/family education;Manual techniques;Passive range of motion;Dry needling;Taping;Joint Manipulations;Spinal Manipulations    PT Next Visit Plan  review HEP    PT Home Exercise Plan  cervical ROM, UT, stretch, shoulder AAROM    Consulted and Agree with Plan of Care  Patient       Patient will benefit from skilled therapeutic intervention in order to improve the following deficits and impairments:  Decreased activity tolerance, Decreased endurance, Decreased range of motion, Decreased strength, Impaired perceived functional ability, Impaired flexibility, Increased fascial restricitons, Increased muscle spasms, Pain, Postural dysfunction, Obesity  Visit Diagnosis: Cervicalgia  Acute pain of right shoulder  Acute pain of left shoulder     Problem List Patient Active Problem List   Diagnosis Date Noted  . OSA (obstructive sleep apnea) 02/03/2017  . Hypothyroidism 02/02/2017  . Chest pain 02/02/2017  . Morbid obesity ([place]) 01/27/2017  . Plantar fasciitis of left foot 11/08/2015  . Non-insulin treated type 2 diabetes mellitus ([place]) 06/08/2015  . Colon cancer screening 06/08/2015  . Bilateral leg edema 12/15/2014  . Blurry vision, bilateral 06/16/2014  . Breast cancer screening 06/16/2014  . Other specified hypothyroidism 06/16/2014  . Other seasonal allergic rhinitis 06/16/2014  . History of stroke 01/31/2014  . Essential hypertension 08/24/2013  . Nonspecific (abnormal) findings on radiological and other examination of gastrointestinal tract 08/12/2013  . Lateral epicondylitis of right elbow 07/21/2013  .  Right shoulder pain 07/21/2013  . Sprain of wrist, right 07/21/2013  . Unspecified constipation 04/15/2013  . Rectal bleeding 04/15/2013  . HTN (hypertension) 01/15/2013  . Diabetes ([place]) 08/10/2012    Debbe Odea, PT, DPT 11/10/2017, 12:17 PM  [facility] [address] [place], [place], [address] Phone: [phone]   Fax:  [phone]  Name: Tiffany Strickland MRN: [id number] Date of Birth: 26-Mar-1955

## 2017-11-12 ENCOUNTER — Ambulatory Visit: Payer: Self-pay | Attending: Family Medicine | Admitting: Physical Therapy

## 2017-11-12 ENCOUNTER — Encounter: Payer: Self-pay | Admitting: Physical Therapy

## 2017-11-12 DIAGNOSIS — M25512 Pain in left shoulder: Secondary | ICD-10-CM | POA: Insufficient documentation

## 2017-11-12 DIAGNOSIS — M542 Cervicalgia: Secondary | ICD-10-CM | POA: Insufficient documentation

## 2017-11-12 DIAGNOSIS — M25511 Pain in right shoulder: Secondary | ICD-10-CM | POA: Insufficient documentation

## 2017-11-12 NOTE — Therapy (Signed)
[place], [place], [address] Phone: [phone]   Fax:  [phone]  Physical Therapy Treatment  Patient Details  Name: Tiffany Strickland MRN: [id number] Date of Birth: May 08, 1955 Referring Provider (PT): Eunice Blase, MD   Encounter Date: 11/12/2017  PT End of Session - 11/12/17 1544    Visit Number  2    Number of Visits  12    Date for PT Re-Evaluation  12/22/17    PT Start Time  6283    PT Stop Time  1626    PT Time Calculation (min)  42 min    Activity Tolerance  Patient tolerated treatment well    Behavior During Therapy  [facility] for tasks assessed/performed       Past Medical History:  Diagnosis Date  . Colon polyp    adenomatous  . Diabetes mellitus without complication ([place])   . Hyperlipidemia   . Hypertension   . Stroke ([place])   . Thyroid disease     Past Surgical History:  Procedure Laterality Date  . ABDOMINAL HYSTERECTOMY    . BLADDER SUSPENSION    . EUS N/A 08/12/2013   Procedure: LOWER ENDOSCOPIC ULTRASOUND (EUS);  Surgeon: Milus Banister, MD;  Location: [facility];  Service: Endoscopy;  Laterality: N/A;  . KNEE SURGERY    . TUBAL LIGATION      There were no vitals filed for this visit.  Subjective Assessment - 11/12/17 1546    Subjective  It really hurts right down the middle. I think the doorway stretch hurt it. It has actually been a good day for my arms.     Patient Stated Goals  get rid of the pain    Currently in Pain?  Yes    Pain Score  8     Pain Location  Neck    Pain Orientation  Mid    Pain Descriptors / Indicators  Tightness                       [facility] Adult PT Treatment/Exercise - 11/12/17 0001      Exercises   Exercises  Shoulder;Neck      Neck Exercises: Seated   Other Seated Exercise  shoulder rolls      Shoulder Exercises: Supine   Horizontal ABduction  20 reps    Theraband Level (Shoulder Horizontal ABduction)  Level 2 (Red)    External Rotation  20 reps    Theraband Level (Shoulder External Rotation)  Level 2 (Red)    Other Supine Exercises  chin tucks    Other Supine Exercises  extension isometric press into table, elbows at 90      Shoulder Exercises: Standing   Other Standing Exercises  cervical retraction      Shoulder Exercises: Pulleys   Flexion  3 minutes      Shoulder Exercises: Stretch   Other Shoulder Stretches  door pec stretch, low      Manual Therapy   Manual Therapy  Soft tissue mobilization;Manual Traction;Passive ROM    Manual therapy comments  discussed TPDN- consider for next visit    Soft tissue mobilization  STM & ischemic release Rt upper trap    Passive ROM  cervical    Manual Traction  cervical      Neck Exercises: Stretches   Upper Trapezius Stretch  Right;Left;2 reps;20 seconds    Levator Stretch  Right;Left;2 reps;20 seconds  PT Long Term Goals - 11/10/17 1212      PT LONG TERM GOAL #1   Title  Pt will be I and compliant with HEP. 6 weeks 12/22/2017    Status  New      PT LONG TERM GOAL #2   Title  Pt will improve bilat shoulder and cervical AROM to [facility]. 6 weeks 12/22/2017    Status  New      PT LONG TERM GOAL #3   Title  Pt will improve bilat shoulder strength to at least 4+/5 to improve function. 6 weeks 12/22/2017    Status  New      PT LONG TERM GOAL #4   Title  Pt will improve FOTO to less than 40% limited. 6 weeks 12/22/2017    Status  New      PT LONG TERM GOAL #5   Title  Pt will decrease pain to overall less than 3/10 with usual activity. 6 weeks 12/22/2017    Status  New            Plan - 11/12/17 1626    Clinical Impression Statement  Significant tightness noted in Rt upper trap resulting in GHJ elevation and impingmenet in flexion. Reported decreased pain after treatment today. Has not see the message from Dr [facility] office about D-Dimer test and I asked that she check her mychart and call the office today if she does  not see it.     PT Treatment/Interventions  Cryotherapy;Electrical Stimulation;Iontophoresis 4mg /ml Dexamethasone;Moist Heat;Traction;Ultrasound;Therapeutic exercise;Therapeutic activities;Neuromuscular re-education;Patient/family education;Manual techniques;Passive range of motion;Dry needling;Taping;Joint Manipulations;Spinal Manipulations    PT Next Visit Plan  gross periscap strength, consider TPDN    PT Home Exercise Plan  cervical ROM, UT, stretch, shoulder AAROM, scap retraction    Consulted and Agree with Plan of Care  Patient       Patient will benefit from skilled therapeutic intervention in order to improve the following deficits and impairments:  Decreased activity tolerance, Decreased endurance, Decreased range of motion, Decreased strength, Impaired perceived functional ability, Impaired flexibility, Increased fascial restricitons, Increased muscle spasms, Pain, Postural dysfunction, Obesity  Visit Diagnosis: Cervicalgia  Acute pain of right shoulder  Acute pain of left shoulder     Problem List Patient Active Problem List   Diagnosis Date Noted  . OSA (obstructive sleep apnea) 02/03/2017  . Hypothyroidism 02/02/2017  . Chest pain 02/02/2017  . Morbid obesity ([place]) 01/27/2017  . Plantar fasciitis of left foot 11/08/2015  . Non-insulin treated type 2 diabetes mellitus ([place]) 06/08/2015  . Colon cancer screening 06/08/2015  . Bilateral leg edema 12/15/2014  . Blurry vision, bilateral 06/16/2014  . Breast cancer screening 06/16/2014  . Other specified hypothyroidism 06/16/2014  . Other seasonal allergic rhinitis 06/16/2014  . History of stroke 01/31/2014  . Essential hypertension 08/24/2013  . Nonspecific (abnormal) findings on radiological and other examination of gastrointestinal tract 08/12/2013  . Lateral epicondylitis of right elbow 07/21/2013  . Right shoulder pain 07/21/2013  . Sprain of wrist, right 07/21/2013  . Unspecified constipation 04/15/2013  .  Rectal bleeding 04/15/2013  . HTN (hypertension) 01/15/2013  . Diabetes ([place]) 08/10/2012    Xylan Sheils C. Laray Rivkin PT, DPT 11/12/17 4:30 PM   [facility] [address] [place], [place], [address] Phone: [phone]   Fax:  [phone]  Name: Reshonda Koerber MRN: [id number] Date of Birth: [birth year]

## 2017-11-13 ENCOUNTER — Emergency Department (HOSPITAL_COMMUNITY)
Admission: EM | Admit: 2017-11-13 | Discharge: 2017-11-13 | Disposition: A | Discharge status: Home / Self Care | Payer: Self-pay | Attending: Emergency Medicine | Admitting: Emergency Medicine

## 2017-11-13 ENCOUNTER — Emergency Department (HOSPITAL_BASED_OUTPATIENT_CLINIC_OR_DEPARTMENT_OTHER)
Admission: EM | Admit: 2017-11-13 | Discharge: 2017-11-13 | Disposition: A | Discharge status: Home / Self Care | Payer: Self-pay | Source: Home / Self Care | Attending: Emergency Medicine | Admitting: Emergency Medicine

## 2017-11-13 ENCOUNTER — Encounter (HOSPITAL_COMMUNITY): Payer: Self-pay | Admitting: Emergency Medicine

## 2017-11-13 DIAGNOSIS — R7989 Other specified abnormal findings of blood chemistry: Secondary | ICD-10-CM

## 2017-11-13 DIAGNOSIS — I10 Essential (primary) hypertension: Secondary | ICD-10-CM | POA: Insufficient documentation

## 2017-11-13 DIAGNOSIS — M25511 Pain in right shoulder: Secondary | ICD-10-CM

## 2017-11-13 DIAGNOSIS — E119 Type 2 diabetes mellitus without complications: Secondary | ICD-10-CM | POA: Insufficient documentation

## 2017-11-13 DIAGNOSIS — M25512 Pain in left shoulder: Secondary | ICD-10-CM

## 2017-11-13 DIAGNOSIS — M79604 Pain in right leg: Secondary | ICD-10-CM | POA: Insufficient documentation

## 2017-11-13 DIAGNOSIS — E079 Disorder of thyroid, unspecified: Secondary | ICD-10-CM | POA: Insufficient documentation

## 2017-11-13 MED ORDER — MELOXICAM 7.5 MG PO TABS: 7.5000 mg | ORAL_TABLET | Freq: Every day | ORAL | 0 refills | Status: DC

## 2017-11-13 NOTE — Progress Notes (Signed)
Left lower extremity venous duplex completed - Preliminary results - Technically difficult due to body habitus - No obvious evidence of DVT or Baker's cyst. Unable to fully visualize the peroneal veins. [facility], [place] 11/13/2017, 10:21 AM

## 2017-11-13 NOTE — ED Triage Notes (Addendum)
Pt c/o RLE pain and swelling. Pt had positive D-dimer, PMD told pt to go to ED, has not been prescribed any meds.

## 2017-11-13 NOTE — ED Provider Notes (Signed)
Emergency Department Provider Note   I have reviewed the triage vital signs and the nursing notes.   HISTORY  Chief Complaint Leg Pain   HPI Tiffany Strickland is a 62 y.o. female with medical problems as documented below the presents the emergency department today with improving right leg pain.  Patient has been seen in orthopedics and physical therapy for shoulder pain and she mentioned her leg pain.  It sounds like orthopedics palpated her leg and it hurt really bad on her calf so d-dimer was ordered over 2 weeks ago and came back positive so a note was put in epic for her to come to the emergency room however she never did.  It was noticed yesterday that she had not come so they sent her.  She still has pain in the back of her calf with slight swelling but no significant swelling.  No shortness of breath or chest pain.  No syncope.  Pain seems to be improving but still persistent.  No rashes. No other associated or modifying symptoms.    Past Medical History:  Diagnosis Date  . Colon polyp    adenomatous  . Diabetes mellitus without complication ([place])   . Hyperlipidemia   . Hypertension   . Stroke ([place])   . Thyroid disease     Patient Active Problem List   Diagnosis Date Noted  . OSA (obstructive sleep apnea) 02/03/2017  . Hypothyroidism 02/02/2017  . Chest pain 02/02/2017  . Morbid obesity ([place]) 01/27/2017  . Plantar fasciitis of left foot 11/08/2015  . Non-insulin treated type 2 diabetes mellitus ([place]) 06/08/2015  . Colon cancer screening 06/08/2015  . Bilateral leg edema 12/15/2014  . Blurry vision, bilateral 06/16/2014  . Breast cancer screening 06/16/2014  . Other specified hypothyroidism 06/16/2014  . Other seasonal allergic rhinitis 06/16/2014  . History of stroke 01/31/2014  . Essential hypertension 08/24/2013  . Nonspecific (abnormal) findings on radiological and other examination of gastrointestinal tract 08/12/2013  . Lateral epicondylitis of right elbow  07/21/2013  . Right shoulder pain 07/21/2013  . Sprain of wrist, right 07/21/2013  . Unspecified constipation 04/15/2013  . Rectal bleeding 04/15/2013  . HTN (hypertension) 01/15/2013  . Diabetes ([place]) 08/10/2012    Past Surgical History:  Procedure Laterality Date  . ABDOMINAL HYSTERECTOMY    . BLADDER SUSPENSION    . EUS N/A 08/12/2013   Procedure: LOWER ENDOSCOPIC ULTRASOUND (EUS);  Surgeon: Milus Banister, MD;  Location: [facility];  Service: Endoscopy;  Laterality: N/A;  . KNEE SURGERY    . TUBAL LIGATION      Current Outpatient Rx  . Order #: [id number] Class: Normal  . Order #: [id number] Class: Normal  . Order #: [id number] Class: Historical Med  . Order #: [id number] Class: Normal  . Order #: [id number] Class: Historical Med  . Order #: [id number] Class: Normal  . Order #: [id number] Class: Print  . Order #: [id number] Class: Normal  . Order #: [id number] Class: Normal  . Order #: [id number] Class: Normal  . Order #: [id number] Class: Normal  . Order #: [id number] Class: Print  . Order #: [id number] Class: Normal  . Order #: [id number] Class: Normal    Allergies Pollen extract and Tomato  Family History  Problem Relation Age of Onset  . Hypertension Mother   . Heart disease Maternal Grandmother   . Cancer Maternal Grandmother        ovarian  . Cancer Maternal Grandfather   . Hypertension Other   . Hyperlipidemia Other   . Cancer Other   . Sleep apnea  Other   . Obesity Other     Social History Social History   Tobacco Use  . Smoking status: Never Smoker  . Smokeless tobacco: Never Used  Substance Use Topics  . Alcohol use: No  . Drug use: No    Review of Systems  All other systems negative except as documented in the HPI. All pertinent positives and negatives as reviewed in the HPI. ____________________________________________   PHYSICAL EXAM:  VITAL SIGNS: ED Triage Vitals [11/13/17 0828]  Enc Vitals Group     BP (!) 155/78     Pulse Rate 89     Resp 16      Temp 98.6 F (37 C)     Temp Source Oral     SpO2 99 %    Constitutional: Alert and oriented. Well appearing and in no acute distress. Eyes: Conjunctivae are normal. PERRL. EOMI. Head: Atraumatic. Nose: No congestion/rhinnorhea. Mouth/Throat: Mucous membranes are moist.  Oropharynx non-erythematous. Neck: No stridor.  No meningeal signs.   Cardiovascular: Normal rate, regular rhythm. Good peripheral circulation. Grossly normal heart sounds.   Respiratory: Normal respiratory effort.  No retractions. Lungs CTAB. Gastrointestinal: Soft and nontender. No distention.  Musculoskeletal: Some moderate right lower extremity tenderness but no significant edema. No gross deformities of extremities. Neurologic:  Normal speech and language. No gross focal neurologic deficits are appreciated.  Skin:  Skin is warm, dry and intact. No rash noted.   ____________________________________________    INITIAL IMPRESSION / ASSESSMENT AND PLAN / ED COURSE  [place] for DVT. No VS abnormalities, CP, SOB or syncope to suggest PE. She has had positive d dimer in past without clot as well.   dvt study negative. Pain improving. Unclear etiology. PCP follow up.    Pertinent labs & imaging results that were available during my care of the patient were reviewed by me and considered in my medical decision making (see chart for details).  ____________________________________________  FINAL CLINICAL IMPRESSION(S) / ED DIAGNOSES  Final diagnoses:  Right leg pain     MEDICATIONS GIVEN DURING THIS VISIT:  Medications - No data to display   NEW OUTPATIENT MEDICATIONS STARTED DURING THIS VISIT:  Discharge Medication List as of 11/13/2017 10:58 AM      Note:  This note was prepared with assistance of Dragon voice recognition software. Occasional wrong-word or sound-a-like substitutions may have occurred due to the inherent limitations of voice recognition software.   Merrily Pew, MD 11/14/17 [phone]

## 2017-11-17 ENCOUNTER — Encounter: Payer: Self-pay | Admitting: Physical Therapy

## 2017-11-17 ENCOUNTER — Ambulatory Visit: Payer: Self-pay | Admitting: Physical Therapy

## 2017-11-17 DIAGNOSIS — M542 Cervicalgia: Secondary | ICD-10-CM

## 2017-11-17 DIAGNOSIS — M25511 Pain in right shoulder: Secondary | ICD-10-CM

## 2017-11-17 DIAGNOSIS — M25512 Pain in left shoulder: Secondary | ICD-10-CM

## 2017-11-17 NOTE — Therapy (Signed)
[place], [place], [address] Phone: [phone]   Fax:  [phone]  Physical Therapy Treatment  Patient Details  Name: Tiffany Strickland MRN: [id number] Date of Birth: 02/26/1955 Referring Provider (PT): Eunice Blase, MD   Encounter Date: 11/17/2017  PT End of Session - 11/17/17 1727    Visit Number  3    Number of Visits  12    Date for PT Re-Evaluation  12/22/17    PT Start Time  1634    PT Stop Time  1735    PT Time Calculation (min)  61 min    Activity Tolerance  Patient tolerated treatment well    Behavior During Therapy  [facility] for tasks assessed/performed       Past Medical History:  Diagnosis Date  . Colon polyp    adenomatous  . Diabetes mellitus without complication ([place])   . Hyperlipidemia   . Hypertension   . Stroke ([place])   . Thyroid disease     Past Surgical History:  Procedure Laterality Date  . ABDOMINAL HYSTERECTOMY    . BLADDER SUSPENSION    . EUS N/A 08/12/2013   Procedure: LOWER ENDOSCOPIC ULTRASOUND (EUS);  Surgeon: Milus Banister, MD;  Location: [facility];  Service: Endoscopy;  Laterality: N/A;  . KNEE SURGERY    . TUBAL LIGATION      There were no vitals filed for this visit.  Subjective Assessment - 11/17/17 1641    Subjective  I have not had pain pain since the last visit until i had to close a window and hurt my neck    Currently in Pain?  Yes    Pain Score  9    9.5 to 10/10  not visually demonstrated   Pain Location  Neck    Pain Descriptors / Indicators  Tightness    Pain Radiating Towards  tingling with exercise then it stops  LT>  RT    Aggravating Factors   closing the window    Pain Relieving Factors  ibuprophen     Effect of Pain on Daily Activities  sleeping difficult    Multiple Pain Sites  --   knees hurt long standing with getting in and out of the car.                        Adult PT Treatment/Exercise - 11/17/17 0001       Shoulder Exercises: Supine   Horizontal ABduction  20 reps    Theraband Level (Shoulder Horizontal ABduction)  Level 2 (Red)    External Rotation  20 reps    Theraband Level (Shoulder External Rotation)  Level 2 (Red)    Flexion  10 reps   narrow grip,  red band   Other Supine Exercises  decompression, needed 2 pillows under left arm,  shoulder press, , head press 5 X 5 seconds each able to do without pain.       Shoulder Exercises: Sidelying   Other Sidelying Exercises  book opener 10 x each  ROM improved  cued initially      Shoulder Exercises: Pulleys   Flexion  3 minutes      Moist Heat Therapy   Number Minutes Moist Heat  12 Minutes    Moist Heat Location  Cervical      Manual Therapy   Manual Therapy  Soft tissue mobilization    Manual therapy comments  tissue softened.  Soft tissue mobilization  instrument assist and     Passive ROM  pec stretch single and both post manual, strumming ti lengthen tissues in neck posy soft tissue,  various groups,, ROM improving.              PT Education - 11/17/17 1727    Education Details  exercise form    Person(s) Educated  Patient    Methods  Explanation;Demonstration;Tactile cues;Verbal cues    Comprehension  Verbalized understanding;Returned demonstration          PT Long Term Goals - 11/17/17 1732      PT LONG TERM GOAL #1   Title  Pt will be I and compliant with HEP. 6 weeks 12/22/2017    Baseline  has HEP memorized so far    Period  Weeks    Status  On-going      PT LONG TERM GOAL #2   Title  Pt will improve bilat shoulder and cervical AROM to [facility]. 6 weeks 12/22/2017    Baseline  flexion WFL in supine    Period  Weeks    Status  On-going      PT LONG TERM GOAL #3   Title  Pt will improve bilat shoulder strength to at least 4+/5 to improve function. 6 weeks 12/22/2017    Period  Weeks    Status  Unable to assess      PT LONG TERM GOAL #4   Title  Pt will improve FOTO to less than 40% limited. 6  weeks 12/22/2017    Period  Weeks    Status  Unable to assess      PT LONG TERM GOAL #5   Title  Pt will decrease pain to overall less than 3/10 with usual activity. 6 weeks 12/22/2017    Baseline  no pain post exercise    Period  Weeks    Status  On-going            Plan - 11/17/17 1728    Clinical Impression Statement  Patient had been feeling well until she had to close a window ( sliding it side to side) and had a neck flare.  No pain noted at end of session prior to moist heat today.  She went to ER to rule out DVT since last visit ,  it was negative. Patient feels she has flexion WNL in supine in shoulders.      PT Next Visit Plan  gross periscap strength, consider TPDN    PT Home Exercise Plan  cervical ROM, UT, stretch, shoulder AAROM, scap retraction,  supine scapular stabilization series ( issued previous) red band    Consulted and Agree with Plan of Care  Patient       Patient will benefit from skilled therapeutic intervention in order to improve the following deficits and impairments:     Visit Diagnosis: Cervicalgia  Acute pain of right shoulder  Acute pain of left shoulder     Problem List Patient Active Problem List   Diagnosis Date Noted  . OSA (obstructive sleep apnea) 02/03/2017  . Hypothyroidism 02/02/2017  . Chest pain 02/02/2017  . Morbid obesity ([place]) 01/27/2017  . Plantar fasciitis of left foot 11/08/2015  . Non-insulin treated type 2 diabetes mellitus ([place]) 06/08/2015  . Colon cancer screening 06/08/2015  . Bilateral leg edema 12/15/2014  . Blurry vision, bilateral 06/16/2014  . Breast cancer screening 06/16/2014  . Other specified hypothyroidism 06/16/2014  . Other seasonal allergic rhinitis  06/16/2014  . History of stroke 01/31/2014  . Essential hypertension 08/24/2013  . Nonspecific (abnormal) findings on radiological and other examination of gastrointestinal tract 08/12/2013  . Lateral epicondylitis of right elbow 07/21/2013  . Right  shoulder pain 07/21/2013  . Sprain of wrist, right 07/21/2013  . Unspecified constipation 04/15/2013  . Rectal bleeding 04/15/2013  . HTN (hypertension) 01/15/2013  . Diabetes ([place]) 08/10/2012    Jonnelle Lawniczak PTA 11/17/2017, 5:34 PM  [facility] [address] [place], [place], [address] Phone: [phone]   Fax:  [phone]  Name: Tiffany Strickland MRN: [id number] Date of Birth: Feb 14, 1955

## 2017-11-20 ENCOUNTER — Ambulatory Visit: Payer: Self-pay

## 2017-11-24 ENCOUNTER — Ambulatory Visit: Payer: Self-pay

## 2017-11-24 DIAGNOSIS — M25512 Pain in left shoulder: Secondary | ICD-10-CM

## 2017-11-24 DIAGNOSIS — M25511 Pain in right shoulder: Secondary | ICD-10-CM

## 2017-11-24 DIAGNOSIS — M542 Cervicalgia: Secondary | ICD-10-CM

## 2017-11-24 NOTE — Therapy (Signed)
[place], [place], [address] Phone: [phone]   Fax:  [phone]  Physical Therapy Treatment  Patient Details  Name: Tiffany Strickland MRN: [id number] Date of Birth: 05/15/1955 Referring Provider (PT): Eunice Blase, MD   Encounter Date: 11/24/2017  PT End of Session - 11/24/17 0954    Visit Number  4    Number of Visits  12    Date for PT Re-Evaluation  12/22/17    PT Start Time  1010    PT Stop Time  1105    PT Time Calculation (min)  55 min    Activity Tolerance  Patient tolerated treatment well    Behavior During Therapy  [facility] for tasks assessed/performed       Past Medical History:  Diagnosis Date  . Colon polyp    adenomatous  . Diabetes mellitus without complication ([place])   . Hyperlipidemia   . Hypertension   . Stroke ([place])   . Thyroid disease     Past Surgical History:  Procedure Laterality Date  . ABDOMINAL HYSTERECTOMY    . BLADDER SUSPENSION    . EUS N/A 08/12/2013   Procedure: LOWER ENDOSCOPIC ULTRASOUND (EUS);  Surgeon: Milus Banister, MD;  Location: [facility];  Service: Endoscopy;  Laterality: N/A;  . KNEE SURGERY    . TUBAL LIGATION      There were no vitals filed for this visit.  Subjective Assessment - 11/24/17 1022    Subjective   Doigng neck streches    Pertinent History  PMH: DM,hypothyroid,CVA 2005, she denies any deficits form this, HTN    Pain Score  8     Pain Location  --   shoulder to elbow   Pain Orientation  Left;Lateral;Right   LT more   Pain Descriptors / Indicators  Pressure    Pain Type  Chronic pain   about 3 months   Pain Onset  More than a month ago    Pain Frequency  Constant    Aggravating Factors   raising arm    Pain Relieving Factors  heat, medication    Pain Score  0    Pain Location  Neck         [facility] PT Assessment - 11/24/17 0001      AROM   AROM Assessment Site  Shoulder    Right/Left Shoulder  Right;Left    Right Shoulder  Extension  57 Degrees    Right Shoulder Flexion  60 Degrees    Right Shoulder ABduction  55 Degrees    Left Shoulder Extension  60 Degrees    Left Shoulder Flexion  45 Degrees    Left Shoulder ABduction  40 Degrees      PROM   Overall PROM Comments  Lt and RT shoulder with overhead in scaption to 130 degrees , rotation WFl with end range tension  LT shoulder overhead in scaption 145 degrees                    [facility] Adult PT Treatment/Exercise - 11/24/17 0001      Shoulder Exercises: Supine   External Rotation  20 reps    Theraband Level (Shoulder External Rotation)  Level 2 (Red)    Other Supine Exercises  Pace apula retraction x 15    Other Supine Exercises  bench press with assist for LT shoulder  x 10.  shoulder ext isometric x 12, ER isometric s x 12  Moist Heat Therapy   Number Minutes Moist Heat  15 Minutes    Moist Heat Location  Shoulder   RT/Lt , neck associated heat with pads     Manual Therapy   Manual Therapy  Joint mobilization    Joint Mobilization  distraction and infer ior and posterior glide     Soft tissue mobilization  manual to both shoulder LT . RT    Passive ROM  rotation RT/LT IR/ER         Neck Exercises: Stretches   Upper Trapezius Stretch  Right;Left;1 rep;30 seconds    Levator Stretch  Right;Left;1 rep;30 seconds                  PT Long Term Goals - 11/17/17 1732      PT LONG TERM GOAL #1   Title  Pt will be I and compliant with HEP. 6 weeks 12/22/2017    Baseline  has HEP memorized so far    Period  Weeks    Status  On-going      PT LONG TERM GOAL #2   Title  Pt will improve bilat shoulder and cervical AROM to [facility]. 6 weeks 12/22/2017    Baseline  flexion WFL in supine    Period  Weeks    Status  On-going      PT LONG TERM GOAL #3   Title  Pt will improve bilat shoulder strength to at least 4+/5 to improve function. 6 weeks 12/22/2017    Period  Weeks    Status  Unable to assess      PT LONG TERM GOAL #4    Title  Pt will improve FOTO to less than 40% limited. 6 weeks 12/22/2017    Period  Weeks    Status  Unable to assess      PT LONG TERM GOAL #5   Title  Pt will decrease pain to overall less than 3/10 with usual activity. 6 weeks 12/22/2017    Baseline  no pain post exercise    Period  Weeks    Status  On-going            Plan - 11/24/17 0954    Clinical Impression Statement  She was able to demo her HEP for neck  Bilateral shoulder motion limited with pain and it appears she is not able to do the shoulder exerciess as well as before,   Will try to progress shoulder ROM and strength, pain control    PT Treatment/Interventions  Cryotherapy;Electrical Stimulation;Iontophoresis 4mg /ml Dexamethasone;Moist Heat;Traction;Ultrasound;Therapeutic exercise;Therapeutic activities;Neuromuscular re-education;Patient/family education;Manual techniques;Passive range of motion;Dry needling;Taping;Joint Manipulations;Spinal Manipulations    PT Next Visit Plan  gross periscap strength, consider TPDN, AAROM , isometrics    PT Home Exercise Plan  cervical ROM, UT, stretch, shoulder AAROM, scap retraction,  supine scapular stabilization series ( issued previous) red band    Consulted and Agree with Plan of Care  Patient       Patient will benefit from skilled therapeutic intervention in order to improve the following deficits and impairments:  Decreased activity tolerance, Decreased endurance, Decreased range of motion, Decreased strength, Impaired perceived functional ability, Impaired flexibility, Increased fascial restricitons, Increased muscle spasms, Pain, Postural dysfunction, Obesity  Visit Diagnosis: Cervicalgia  Acute pain of right shoulder  Acute pain of left shoulder     Problem List Patient Active Problem List   Diagnosis Date Noted  . OSA (obstructive sleep apnea) 02/03/2017  . Hypothyroidism 02/02/2017  . Chest pain  02/02/2017  . Morbid obesity ([place]) 01/27/2017  . Plantar  fasciitis of left foot 11/08/2015  . Non-insulin treated type 2 diabetes mellitus ([place]) 06/08/2015  . Colon cancer screening 06/08/2015  . Bilateral leg edema 12/15/2014  . Blurry vision, bilateral 06/16/2014  . Breast cancer screening 06/16/2014  . Other specified hypothyroidism 06/16/2014  . Other seasonal allergic rhinitis 06/16/2014  . History of stroke 01/31/2014  . Essential hypertension 08/24/2013  . Nonspecific (abnormal) findings on radiological and other examination of gastrointestinal tract 08/12/2013  . Lateral epicondylitis of right elbow 07/21/2013  . Right shoulder pain 07/21/2013  . Sprain of wrist, right 07/21/2013  . Unspecified constipation 04/15/2013  . Rectal bleeding 04/15/2013  . HTN (hypertension) 01/15/2013  . Diabetes ([place]) 08/10/2012    Darrel Hoover  PT 11/24/2017, 10:55 AM  [facility] [address] [place], [place], [address] Phone: [phone]   Fax:  [phone]  Name: Tiffany Strickland MRN: [id number] Date of Birth: 04-15-1955

## 2017-11-26 ENCOUNTER — Ambulatory Visit: Payer: Self-pay | Admitting: Physical Therapy

## 2017-11-26 ENCOUNTER — Encounter: Payer: Self-pay | Admitting: Physical Therapy

## 2017-11-26 DIAGNOSIS — M25511 Pain in right shoulder: Secondary | ICD-10-CM

## 2017-11-26 DIAGNOSIS — M25512 Pain in left shoulder: Secondary | ICD-10-CM

## 2017-11-26 DIAGNOSIS — M542 Cervicalgia: Secondary | ICD-10-CM

## 2017-11-26 NOTE — Therapy (Signed)
Tiffany Strickland, [place], [address] Phone: [phone]   Fax:  [phone]  Physical Therapy Treatment  Patient Details  Name: Tiffany Strickland MRN: [id number] Date of Birth: 1955/02/22 Referring Provider (PT): Eunice Blase, MD   Encounter Date: 11/26/2017  PT End of Session - 11/26/17 1445    Visit Number  5    Number of Visits  12    Date for PT Re-Evaluation  12/22/17    PT Start Time  1346    PT Stop Time  1445    PT Time Calculation (min)  59 min    Activity Tolerance  Patient tolerated treatment well    Behavior During Therapy  [facility] for tasks assessed/performed       Past Medical History:  Diagnosis Date  . Colon polyp    adenomatous  . Diabetes mellitus without complication ([place])   . Hyperlipidemia   . Hypertension   . Stroke ([place])   . Thyroid disease     Past Surgical History:  Procedure Laterality Date  . ABDOMINAL HYSTERECTOMY    . BLADDER SUSPENSION    . EUS N/A 08/12/2013   Procedure: LOWER ENDOSCOPIC ULTRASOUND (EUS);  Surgeon: Milus Banister, MD;  Location: [facility];  Service: Endoscopy;  Laterality: N/A;  . KNEE SURGERY    . TUBAL LIGATION      There were no vitals filed for this visit.  Subjective Assessment - 11/26/17 1422    Subjective  Pt overslept for her AM appt.  Sinus problems.  She has had less pain.  May go to MD and get Rx for her knee/leg.     Currently in Pain?  Yes    Pain Score  3     Pain Location  Shoulder    Pain Orientation  Right;Left   L>R    Pain Descriptors / Indicators  Sore    Pain Type  Chronic pain    Pain Onset  More than a month ago    Pain Frequency  Intermittent    Aggravating Factors   raising arm     Pain Relieving Factors  heat, meds          [facility] PT Assessment - 11/26/17 0001      Strength   Right Shoulder Flexion  4/5    Right Shoulder ABduction  3+/5    Right Shoulder Internal Rotation  4+/5    Right Shoulder External  Rotation  4+/5    Left Shoulder Flexion  3+/5    Left Shoulder ABduction  3+/5    Left Shoulder Internal Rotation  4/5    Left Shoulder External Rotation  4/5         [facility] Adult PT Treatment/Exercise - 11/26/17 0001      Shoulder Exercises: Supine   Horizontal ABduction  Strengthening;Both;10 reps    Theraband Level (Shoulder Horizontal ABduction)  Level 2 (Red)    External Rotation  Strengthening;Both;20 reps    Theraband Level (Shoulder External Rotation)  Level 3 (Green)    Flexion  10 reps   narrow grip,  red band   Diagonals  Strengthening;Both;10 reps    Theraband Level (Shoulder Diagonals)  Level 2 (Red)      Shoulder Exercises: Sidelying   External Rotation  Strengthening;Both;10 reps    External Rotation Weight (lbs)  2    ABduction  Strengthening;Both;10 reps    ABduction Weight (lbs)  2      Shoulder Exercises: Standing  Horizontal ABduction  Strengthening;Both;10 reps    Horizontal ABduction Weight (lbs)  arms behind back (extension ) green     External Rotation  Strengthening;Both;10 reps    Theraband Level (Shoulder External Rotation)  Level 3 (Green)    Extension  Strengthening;Both;10 reps    Shoulder Elevation Limitations  wall slide both arms x 10     Other Standing Exercises  cane wide grip x 15 against wall     Other Standing Exercises  magic circle press out iso ER and also iso adduction       Shoulder Exercises: Pulleys   Flexion  2 minutes    Scaption  2 minutes      Shoulder Exercises: ROM/Strengthening   UBE (Upper Arm Bike)  6 min in reverse, L1        Moist Heat Therapy   Number Minutes Moist Heat  10 Minutes    Moist Heat Location  Shoulder   bilat.      Manual Therapy   Passive ROM  bilateral shoulders all planes to tolerance, gentle              PT Education - 11/26/17 1445    Education Details  increased band tension    Person(s) Educated  Patient    Methods  Explanation    Comprehension  Verbalized  understanding;Returned demonstration          PT Long Term Goals - 11/26/17 1448      PT LONG TERM GOAL #1   Title  Pt will be I and compliant with HEP. 6 weeks 12/22/2017    Status  On-going      PT LONG TERM GOAL #2   Title  Pt will improve bilat shoulder and cervical AROM to [facility]. 6 weeks 12/22/2017    Status  On-going      PT LONG TERM GOAL #3   Title  Pt will improve bilat shoulder strength to at least 4+/5 to improve function. 6 weeks 12/22/2017    Status  On-going      PT LONG TERM GOAL #4   Title  Pt will improve FOTO to less than 40% limited. 6 weeks 12/22/2017    Status  On-going      PT LONG TERM GOAL #5   Title  Pt will decrease pain to overall less than 3/10 with usual activity. 6 weeks 12/22/2017    Status  On-going            Plan - 11/26/17 1449    Clinical Impression Statement  Patient is doing well, needed the wall for feedback in order to perform proper scapular position for elevation.   Added another visit to next week as she will be out of town next week. Weak in bilateral shoulder flexion, abduction, Rotator cuff strength is good.     PT Treatment/Interventions  Cryotherapy;Electrical Stimulation;Iontophoresis 4mg /ml Dexamethasone;Moist Heat;Traction;Ultrasound;Therapeutic exercise;Therapeutic activities;Neuromuscular re-education;Patient/family education;Manual techniques;Passive range of motion;Dry needling;Taping;Joint Manipulations;Spinal Manipulations    PT Next Visit Plan  gross periscap strength, consider TPDN, AROM as tolerated. UBE in reverse     PT Home Exercise Plan  cervical ROM, UT, stretch, shoulder AAROM, scap retraction,  supine scapular stabilization series ( issued previous) red band and green band     Consulted and Agree with Plan of Care  Patient       Patient will benefit from skilled therapeutic intervention in order to improve the following deficits and impairments:  Decreased activity tolerance, Decreased endurance, Decreased  range  of motion, Decreased strength, Impaired perceived functional ability, Impaired flexibility, Increased fascial restricitons, Increased muscle spasms, Pain, Postural dysfunction, Obesity  Visit Diagnosis: Cervicalgia  Acute pain of right shoulder  Acute pain of left shoulder     Problem List Patient Active Problem List   Diagnosis Date Noted  . OSA (obstructive sleep apnea) 02/03/2017  . Hypothyroidism 02/02/2017  . Chest pain 02/02/2017  . Morbid obesity ([place]) 01/27/2017  . Plantar fasciitis of left foot 11/08/2015  . Non-insulin treated type 2 diabetes mellitus ([place]) 06/08/2015  . Colon cancer screening 06/08/2015  . Bilateral leg edema 12/15/2014  . Blurry vision, bilateral 06/16/2014  . Breast cancer screening 06/16/2014  . Other specified hypothyroidism 06/16/2014  . Other seasonal allergic rhinitis 06/16/2014  . History of stroke 01/31/2014  . Essential hypertension 08/24/2013  . Nonspecific (abnormal) findings on radiological and other examination of gastrointestinal tract 08/12/2013  . Lateral epicondylitis of right elbow 07/21/2013  . Right shoulder pain 07/21/2013  . Sprain of wrist, right 07/21/2013  . Unspecified constipation 04/15/2013  . Rectal bleeding 04/15/2013  . HTN (hypertension) 01/15/2013  . Diabetes ([place]) 08/10/2012    PAA,JENNIFER 11/26/2017, 2:56 PM  [facility] [address] [place], [place], [address] Phone: [phone]   Fax:  [phone]  Name: Zaelyn Noack MRN: [id number] Date of Birth: [birth year]   Raeford Razor, PT 11/26/17 2:56 PM Phone: [phone] Fax: [phone]

## 2017-12-08 ENCOUNTER — Encounter: Payer: Self-pay | Admitting: Physical Therapy

## 2017-12-08 ENCOUNTER — Ambulatory Visit: Payer: Self-pay | Admitting: Physical Therapy

## 2017-12-08 MED FILL — POLYETHYLENE GLYCOL 3350 PO: 28 days supply | Qty: 476 | Fill #3

## 2017-12-08 MED FILL — ?LEVOTHYROXINE 150 MCG TAB: 150 | 30 days supply | Qty: 30 | Fill #0

## 2017-12-08 MED FILL — LOSARTAN-HCTZ 100-25 MG TAB: 100-25 | 30 days supply | Qty: 30 | Fill #1

## 2017-12-08 MED FILL — ISOSORBIDE MN ER 30 MG TAB: 30 | 30 days supply | Qty: 30 | Fill #3

## 2017-12-10 ENCOUNTER — Ambulatory Visit: Payer: Self-pay | Admitting: Physical Therapy

## 2017-12-10 ENCOUNTER — Encounter: Payer: Self-pay | Admitting: Physical Therapy

## 2017-12-10 DIAGNOSIS — M542 Cervicalgia: Secondary | ICD-10-CM

## 2017-12-10 DIAGNOSIS — M25512 Pain in left shoulder: Secondary | ICD-10-CM

## 2017-12-10 DIAGNOSIS — M25511 Pain in right shoulder: Secondary | ICD-10-CM

## 2017-12-10 NOTE — Therapy (Signed)
[place], [place], [address] Phone: [phone]   Fax:  [phone]  Physical Therapy Treatment/Discharge  Patient Details  Name: Tiffany Strickland MRN: [id number] Date of Birth: 1956/01/10 Referring Provider (PT): Eunice Blase, MD   Encounter Date: 12/10/2017  PT End of Session - 12/10/17 1153    Visit Number  6    Number of Visits  12    Date for PT Re-Evaluation  12/22/17    PT Start Time  6195   pt arrived late   PT Stop Time  1229    PT Time Calculation (min)  36 min    Activity Tolerance  Patient tolerated treatment well    Behavior During Therapy  [facility] for tasks assessed/performed       Past Medical History:  Diagnosis Date  . Colon polyp    adenomatous  . Diabetes mellitus without complication ([place])   . Hyperlipidemia   . Hypertension   . Stroke ([place])   . Thyroid disease     Past Surgical History:  Procedure Laterality Date  . ABDOMINAL HYSTERECTOMY    . BLADDER SUSPENSION    . EUS N/A 08/12/2013   Procedure: LOWER ENDOSCOPIC ULTRASOUND (EUS);  Surgeon: Milus Banister, MD;  Location: [facility];  Service: Endoscopy;  Laterality: N/A;  . KNEE SURGERY    . TUBAL LIGATION      There were no vitals filed for this visit.  Subjective Assessment - 12/10/17 1153    Subjective  A little soreness in Lt shoulder if she gets cold but thats about it. Most pain in Rt leg    Currently in Pain?  No/denies         [facility] PT Assessment - 12/10/17 0001      Assessment   Medical Diagnosis  neck and bilat shoulder pain    Referring Provider (PT)  Eunice Blase, MD      Observation/Other Assessments   Focus on Therapeutic Outcomes (FOTO)   39% limited      AROM   Right Shoulder Flexion  128 Degrees    Right Shoulder ABduction  140 Degrees    Left Shoulder Flexion  125 Degrees    Left Shoulder ABduction  105 Degrees    Cervical Flexion  50    Cervical - Right Side Bend  26    Cervical -  Left Side Bend  30    Cervical - Right Rotation  58    Cervical - Left Rotation  56      Strength   Overall Strength Comments  gross 4+/5                   [facility] Adult PT Treatment/Exercise - 12/10/17 0001      Exercises   Exercises  Other Exercises    Other Exercises   reviewed HEP exercises listed in "Plan"             PT Education - 12/10/17 1243    Education Details  goals discussion, HEP, exercise form/rationale, s/s of DVT and emergent state if she sees them    Person(s) Educated  Patient    Methods  Explanation;Demonstration;Tactile cues;Verbal cues    Comprehension  Verbalized understanding;Returned demonstration          PT Long Term Goals - 12/10/17 1200      PT LONG TERM GOAL #1   Title  Pt will be I and compliant with HEP. 6 weeks 12/22/2017  Status  Achieved      PT LONG TERM GOAL #2   Title  Pt will improve bilat shoulder and cervical AROM to [facility]. 6 weeks 12/22/2017    Status  Achieved      PT LONG TERM GOAL #3   Title  Pt will improve bilat shoulder strength to at least 4+/5 to improve function. 6 weeks 12/22/2017    Status  Achieved      PT LONG TERM GOAL #4   Title  Pt will improve FOTO to less than 40% limited. 6 weeks 12/22/2017    Baseline  39% limited    Status  Achieved      PT LONG TERM GOAL #5   Title  Pt will decrease pain to overall less than 3/10 with usual activity. 6 weeks 12/22/2017    Status  Achieved            Plan - 12/10/17 1208    Clinical Impression Statement  Pt has met goals for neck/shoulders at this time and will be d/c to independent program. Sees MD on Monday and plans to ask for referral to treat Rt leg.     PT Treatment/Interventions  Cryotherapy;Electrical Stimulation;Iontophoresis 55m/ml Dexamethasone;Moist Heat;Traction;Ultrasound;Therapeutic exercise;Therapeutic activities;Neuromuscular re-education;Patient/family education;Manual techniques;Passive range of motion;Dry needling;Taping;Joint  Manipulations;Spinal Manipulations    PT Home Exercise Plan  cervical ROM, UT, stretch, shoulder AAROM, scap retraction,  supine scapular stabilization series ( issued previous) red band and green band     Consulted and Agree with Plan of Care  Patient       Patient will benefit from skilled therapeutic intervention in order to improve the following deficits and impairments:  Decreased activity tolerance, Decreased endurance, Decreased range of motion, Decreased strength, Impaired perceived functional ability, Impaired flexibility, Increased fascial restricitons, Increased muscle spasms, Pain, Postural dysfunction, Obesity  Visit Diagnosis: Cervicalgia  Acute pain of right shoulder  Acute pain of left shoulder     Problem List Patient Active Problem List   Diagnosis Date Noted  . OSA (obstructive sleep apnea) 02/03/2017  . Hypothyroidism 02/02/2017  . Chest pain 02/02/2017  . Morbid obesity ([place] 01/27/2017  . Plantar fasciitis of left foot 11/08/2015  . Non-insulin treated type 2 diabetes mellitus ([place] 06/08/2015  . Colon cancer screening 06/08/2015  . Bilateral leg edema 12/15/2014  . Blurry vision, bilateral 06/16/2014  . Breast cancer screening 06/16/2014  . Other specified hypothyroidism 06/16/2014  . Other seasonal allergic rhinitis 06/16/2014  . History of stroke 01/31/2014  . Essential hypertension 08/24/2013  . Nonspecific (abnormal) findings on radiological and other examination of gastrointestinal tract 08/12/2013  . Lateral epicondylitis of right elbow 07/21/2013  . Right shoulder pain 07/21/2013  . Sprain of wrist, right 07/21/2013  . Unspecified constipation 04/15/2013  . Rectal bleeding 04/15/2013  . HTN (hypertension) 01/15/2013  . Diabetes ([place] 08/10/2012    Lailanie Hasley C. Sri Clegg PT, DPT 12/10/17 12:47 PM   PHYSICAL THERAPY DISCHARGE SUMMARY  Visits from Start of Care: 6  Current functional level related to goals / functional outcomes: See  above   Remaining deficits: See above   Education / Equipment: Anatomy of condition, POC, HEP, exercise form/rationale  Plan: Patient agrees to discharge.  Patient goals were met. Patient is being discharged due to meeting the stated rehab goals.  ?????      [place] [address]Phone: [phone]  Fax:  [phone] Name: FMalillany KazlauskasMRN: [id number] of  Birth: Aug 11, 1955

## 2017-12-15 ENCOUNTER — Encounter (INDEPENDENT_AMBULATORY_CARE_PROVIDER_SITE_OTHER): Payer: Self-pay | Admitting: Family Medicine

## 2017-12-15 ENCOUNTER — Ambulatory Visit (INDEPENDENT_AMBULATORY_CARE_PROVIDER_SITE_OTHER): Payer: Self-pay | Admitting: Family Medicine

## 2017-12-15 ENCOUNTER — Encounter: Payer: Self-pay | Admitting: Physical Therapy

## 2017-12-15 DIAGNOSIS — M545 Low back pain, unspecified: Secondary | ICD-10-CM

## 2017-12-15 DIAGNOSIS — M79604 Pain in right leg: Secondary | ICD-10-CM

## 2017-12-15 NOTE — Progress Notes (Signed)
Office Visit Note   Patient: Tiffany Strickland           Date of Birth: Jul 02, 1955           MRN: [id number] Visit Date: 12/15/2017 Requested by: Charlott Rakes, MD [place], [place] [address] PCP: Charlott Rakes, MD  Subjective: Chief Complaint  Patient presents with  . Right Leg - Pain    Continues to have pain in the knee - now pain is into the thigh (whole thigh) and into the right groin.  Leg "feels heavy." Started turmeric yesterday to help with her inflammation.    HPI: She is here with right leg pain.  We discussed this at her last visit, d-dimer was positive but Doppler was negative for DVT.  Her pain has moved into the anterior thigh and she has a feeling of heaviness in her leg.  She has some lower back pain as well, but she was not sure whether it was connected.  Incidentally, physical therapy helped substantially with her shoulder pains.              ROS: Noncontributory  Objective: Vital Signs: There were no vitals taken for this visit.  Physical Exam:  Low back: She has some tenderness to palpation near the L5-S1 level in the midline.  No pain over the sacroiliac joint or in the sciatic notch.  Negative straight leg raise, no significant pain with passive internal hip rotation.  5/5 strength with hip flexion, knee extension, ankle dorsiflexion, plantarflexion, eversion, and great toe extension.  2+ DTRs.  Slight tenderness to palpation of the quadricep muscle.  Imaging: None today.  Assessment & Plan: 1.  Low back and right leg pain, etiology uncertain.  Cannot rule out disc protrusion. -Trial of physical therapy.  If symptoms persist, then possibly x-rays lumbar spine and right hip.   Follow-Up Instructions: No follow-ups on file.       Procedures: None today.   PMFS History: Patient Active Problem List   Diagnosis Date Noted  . OSA (obstructive sleep apnea) 02/03/2017  . Hypothyroidism 02/02/2017  . Chest pain 02/02/2017  . Morbid  obesity ([place]) 01/27/2017  . Plantar fasciitis of left foot 11/08/2015  . Non-insulin treated type 2 diabetes mellitus ([place]) 06/08/2015  . Colon cancer screening 06/08/2015  . Bilateral leg edema 12/15/2014  . Blurry vision, bilateral 06/16/2014  . Breast cancer screening 06/16/2014  . Other specified hypothyroidism 06/16/2014  . Other seasonal allergic rhinitis 06/16/2014  . History of stroke 01/31/2014  . Essential hypertension 08/24/2013  . Nonspecific (abnormal) findings on radiological and other examination of gastrointestinal tract 08/12/2013  . Lateral epicondylitis of right elbow 07/21/2013  . Right shoulder pain 07/21/2013  . Sprain of wrist, right 07/21/2013  . Unspecified constipation 04/15/2013  . Rectal bleeding 04/15/2013  . HTN (hypertension) 01/15/2013  . Diabetes ([place]) 08/10/2012   Past Medical History:  Diagnosis Date  . Colon polyp    adenomatous  . Diabetes mellitus without complication ([place])   . Hyperlipidemia   . Hypertension   . Stroke ([place])   . Thyroid disease     Family History  Problem Relation Age of Onset  . Hypertension Mother   . Heart disease Maternal Grandmother   . Cancer Maternal Grandmother        ovarian  . Cancer Maternal Grandfather   . Hypertension Other   . Hyperlipidemia Other   . Cancer Other   . Sleep apnea Other   . Obesity  Other     Past Surgical History:  Procedure Laterality Date  . ABDOMINAL HYSTERECTOMY    . BLADDER SUSPENSION    . EUS N/A 08/12/2013   Procedure: LOWER ENDOSCOPIC ULTRASOUND (EUS);  Surgeon: Milus Banister, MD;  Location: [facility];  Service: Endoscopy;  Laterality: N/A;  . KNEE SURGERY    . TUBAL LIGATION     Social History   Occupational History  . Not on file  Tobacco Use  . Smoking status: Never Smoker  . Smokeless tobacco: Never Used  Substance and Sexual Activity  . Alcohol use: No  . Drug use: No  . Sexual activity: Yes    Birth control/protection: Surgical

## 2017-12-17 ENCOUNTER — Encounter: Payer: Self-pay | Admitting: Physical Therapy

## 2017-12-22 ENCOUNTER — Encounter: Payer: Self-pay | Admitting: Physical Therapy

## 2017-12-24 ENCOUNTER — Other Ambulatory Visit: Payer: Self-pay | Admitting: Obstetrics and Gynecology

## 2017-12-24 ENCOUNTER — Encounter: Payer: Self-pay | Admitting: Physical Therapy

## 2017-12-24 ENCOUNTER — Ambulatory Visit: Payer: Self-pay | Attending: Family Medicine | Admitting: Physical Therapy

## 2017-12-24 DIAGNOSIS — G8929 Other chronic pain: Secondary | ICD-10-CM | POA: Insufficient documentation

## 2017-12-24 DIAGNOSIS — M79661 Pain in right lower leg: Secondary | ICD-10-CM | POA: Insufficient documentation

## 2017-12-24 DIAGNOSIS — R262 Difficulty in walking, not elsewhere classified: Secondary | ICD-10-CM | POA: Insufficient documentation

## 2017-12-24 DIAGNOSIS — M25561 Pain in right knee: Secondary | ICD-10-CM | POA: Insufficient documentation

## 2017-12-24 DIAGNOSIS — M6281 Muscle weakness (generalized): Secondary | ICD-10-CM | POA: Insufficient documentation

## 2017-12-24 DIAGNOSIS — Z1231 Encounter for screening mammogram for malignant neoplasm of breast: Secondary | ICD-10-CM

## 2017-12-24 NOTE — Therapy (Signed)
[place], [place], [address] Phone: [phone]   Fax:  [phone]  Physical Therapy Evaluation  Patient Details  Name: Tiffany Strickland MRN: [id number] Date of Birth: 1955-02-13 Referring Provider (PT): Eunice Blase, MD   Encounter Date: 12/24/2017  PT End of Session - 12/24/17 1006    Visit Number  1   6 visits for shoulder previously   Number of Visits  13    Date for PT Re-Evaluation  02/23/18    PT Start Time  0930    PT Stop Time  1015    PT Time Calculation (min)  45 min    Activity Tolerance  Patient limited by pain    Behavior During Therapy  [facility] for tasks assessed/performed       Past Medical History:  Diagnosis Date  . Colon polyp    adenomatous  . Diabetes mellitus without complication ([place])   . Hyperlipidemia   . Hypertension   . Stroke ([place])   . Thyroid disease     Past Surgical History:  Procedure Laterality Date  . ABDOMINAL HYSTERECTOMY    . BLADDER SUSPENSION    . EUS N/A 08/12/2013   Procedure: LOWER ENDOSCOPIC ULTRASOUND (EUS);  Surgeon: Milus Banister, MD;  Location: [facility];  Service: Endoscopy;  Laterality: N/A;  . KNEE SURGERY    . TUBAL LIGATION      There were no vitals filed for this visit.   Subjective Assessment - 12/24/17 0936    Subjective  Pt arriving to therapy today for evaluation of her R knee pain. Pt reporting pain of 7/10 in her thigh and calf. Pt reports pain radiates into her R groin. Pt reporting this pain has been going on for months. Pt also reports not eating or drinking much yesterday.  Pt feels the cold temperature today has agrivated both her knee and shoulders.     Pertinent History  PMH: DM,hypothyroid,CVA 2005, she denies any deficits form this, HTN    How long can you stand comfortably?  5 minutes, then the knee and lower back begin to hurt    How long can you walk comfortably?  5 minutes    Diagnostic tests  no recent imaging of  knee    Patient Stated Goals  stop hurting, be able to walk without pain,     Currently in Pain?  Yes    Pain Score  7     Pain Location  Leg    Pain Orientation  Right    Pain Descriptors / Indicators  Aching;Discomfort;Sore    Pain Type  Chronic pain    Pain Radiating Towards  extends into groin and down into calf    Pain Onset  More than a month ago    Pain Frequency  Constant    Aggravating Factors   stadning, walking, doing household chores, ADL's, basically movements    Pain Relieving Factors  heat, tumerirc    Effect of Pain on Daily Activities  unable to do ADL's without assistance, unable to do household chores,          [facility] PT Assessment - 12/24/17 0001      Assessment   Medical Diagnosis  R leg pain    Referring Provider (PT)  Eunice Blase, MD    Onset Date/Surgical Date  --   follow up when done with therapy   Hand Dominance  Right    Prior Therapy  yes for shoulders  Restrictions   Weight Bearing Restrictions  No      Balance Screen   Has the patient fallen in the past 6 months  No    Has the patient had a decrease in activity level because of a fear of falling?   Yes    Is the patient reluctant to leave their home because of a fear of falling?   No      Home Environment   Living Environment  Private residence    Living Arrangements  Alone    Type of [place] Access  Level entry    Home Layout  One level    [place]  None      Prior Function   Level of [place]  Retired    Leisure  travel, bowling, swimming, being outside with grandkids      Cognition   Overall Cognitive Status  Within Functional Limits for tasks assessed      ROM / Strength   AROM / PROM / Strength  AROM;Strength      AROM   AROM Assessment Site  Knee    Right/Left Knee  Right;Left    Right Knee Extension  5   lacking 5 degrees of extension to reach 0   Right Knee Flexion  90    Left Knee Extension  0    Left Knee  Flexion  115      Strength   Strength Assessment Site  Knee    Right/Left Knee  Right;Left    Right Knee Flexion  3/5    Right Knee Extension  3/5    Left Knee Flexion  4+/5    Left Knee Extension  4+/5      Palpation   Patella mobility  no pain with patella mobs on the R LE    Palpation comment  R quad pain with palpation, tenderness in R calf, Pt with tightness in R IT band with trigger points noted      Transfers   Five time sit to stand comments   Pt requiring 40 seconds to stand once due to R knee pain. Pt using bilateral UE support    Comments  During standing attempt, pt has to shimmy forward and attempt standing several times before reaching full standing position. Once standing, pt needed verbal instructions for standing posture                 Objective measurements completed on examination: See above findings.              PT Education - 12/24/17 1005    Education Details  discussed HEP with HS stretch, self soft tissue mobilizations using tennis ball, and sit to stand exercise, rationale for PT POC    Person(s) Educated  Patient    Methods  Explanation;Demonstration;Handout    Comprehension  Verbalized understanding;Returned demonstration          PT Long Term Goals - 12/24/17 1010      PT LONG TERM GOAL #1   Title  Pt will be I and compliant with HEP and progression.    Baseline   HEP for LE started on 12/24/17    Time  6    Period  Weeks    Status  New    Target Date  02/06/18      PT LONG TERM GOAL #2   Title  Pt will be able to perform  sit to stand in less than 10 seconds using UE support as needed.     Baseline  sit to stand in 40 seconds using multiple attemps to stand.     Time  6    Period  Weeks    Status  New    Target Date  02/06/18      PT LONG TERM GOAL #3   Title  Pt will improve her FOTO score from 53% limitaiton to </= 43% limiation.     Time  6    Period  Weeks    Status  New    Target Date  02/06/18      PT  LONG TERM GOAL #4   Title  Pt will be able to amb on community surfaces >/= 1000 feet with pain </= 3/10 in her R LE.     Baseline  pain with amb 7-8/10.     Time  6    Period  Weeks    Status  New    Target Date  02/06/18      PT LONG TERM GOAL #5   Title  Pt will be able to step up and down off a curb safely.     Baseline  unable without assistance    Time  6    Period  Weeks    Status  New    Target Date  02/06/18             Plan - 12/24/17 1116    Clinical Impression Statement  Pt arriving today for PT evaluation of her R LE pain which extends from her knee to her groin and also reports pain in her calf. Pt with trigger points noted in R IT band and muscle tightness in R quad. Pt with limited ROM in R knee 5-90 degrees with pain of 7/10. Pt with extreme diffictuly standing from a chair which required 40 seconds using bilateral UE support. Pt amb with antalgic gait with wide BOS and increased knee flexion. Pt has been rulled out for DVT with doppler study. Pt with swelling noted in R knee, calf and foot. Pt reporting not eating or drinking much yesterday, during session pt drank 4 cups of water and was given a peppermint due to possible low blood sugar. Skilled PT needed to progress pt toward her PLOF.      History and Personal Factors relevant to plan of care:  DM, hypothyroid, CVA, HTN, h/o therapy for bilateral shoulders    Clinical Presentation  Evolving    Clinical Presentation due to:  pain radiation up and down LE    Clinical Decision Making  Low    Rehab Potential  Good    Clinical Impairments Affecting Rehab Potential  potential financial restrictions    PT Frequency  2x / week    PT Duration  6 weeks    PT Treatment/Interventions  Cryotherapy;Electrical Stimulation;Moist Heat;Ultrasound;Therapeutic exercise;Therapeutic activities;Neuromuscular re-education;Patient/family education;Manual techniques;Passive range of motion;Dry needling;Taping;Joint  Manipulations;ADLs/Self Care Home Management;Balance training;Functional mobility training;Gait training;Stair training    PT Next Visit Plan  Nustep pending pt's pain tolerance, knee ROM and strengthening, hamstring stretching, hip flexor stretching    PT Home Exercise Plan  sit to stand, self massage using tennis ball on IT band and quad, hamstring stretch    Consulted and Agree with Plan of Care  Patient       Patient will benefit from skilled therapeutic intervention in order to improve the following deficits and impairments:  Decreased  activity tolerance, Decreased endurance, Decreased range of motion, Decreased strength, Impaired perceived functional ability, Impaired flexibility, Increased fascial restricitons, Increased muscle spasms, Pain, Postural dysfunction, Obesity  Visit Diagnosis: Chronic pain of right knee  Pain in right lower leg  Difficulty in walking, not elsewhere classified  Muscle weakness (generalized)     Problem List Patient Active Problem List   Diagnosis Date Noted  . OSA (obstructive sleep apnea) 02/03/2017  . Hypothyroidism 02/02/2017  . Chest pain 02/02/2017  . Morbid obesity ([place]) 01/27/2017  . Plantar fasciitis of left foot 11/08/2015  . Non-insulin treated type 2 diabetes mellitus ([place]) 06/08/2015  . Colon cancer screening 06/08/2015  . Bilateral leg edema 12/15/2014  . Blurry vision, bilateral 06/16/2014  . Breast cancer screening 06/16/2014  . Other specified hypothyroidism 06/16/2014  . Other seasonal allergic rhinitis 06/16/2014  . History of stroke 01/31/2014  . Essential hypertension 08/24/2013  . Nonspecific (abnormal) findings on radiological and other examination of gastrointestinal tract 08/12/2013  . Lateral epicondylitis of right elbow 07/21/2013  . Right shoulder pain 07/21/2013  . Sprain of wrist, right 07/21/2013  . Unspecified constipation 04/15/2013  . Rectal bleeding 04/15/2013  . HTN (hypertension) 01/15/2013  . Diabetes  ([place]) 08/10/2012    Oretha Caprice, PT 12/24/2017, 11:29 AM  [facility] [address] [place], [place], [address] Phone: [phone]   Fax:  [phone]  Name: Tiffany Strickland MRN: [id number] Date of Birth: Feb 06, 1956

## 2017-12-30 ENCOUNTER — Ambulatory Visit: Payer: Self-pay | Admitting: Physical Therapy

## 2017-12-30 ENCOUNTER — Encounter: Payer: Self-pay | Admitting: Physical Therapy

## 2017-12-30 DIAGNOSIS — R262 Difficulty in walking, not elsewhere classified: Secondary | ICD-10-CM

## 2017-12-30 DIAGNOSIS — M25561 Pain in right knee: Principal | ICD-10-CM

## 2017-12-30 DIAGNOSIS — M6281 Muscle weakness (generalized): Secondary | ICD-10-CM

## 2017-12-30 DIAGNOSIS — M79661 Pain in right lower leg: Secondary | ICD-10-CM

## 2017-12-30 DIAGNOSIS — G8929 Other chronic pain: Secondary | ICD-10-CM

## 2017-12-30 NOTE — Therapy (Signed)
[place], [place], [address] Phone: [phone]   Fax:  [phone]  Physical Therapy Treatment  Patient Details  Name: Tiffany Strickland MRN: [id number] Date of Birth: 1955/10/19 Referring Provider (PT): Eunice Blase, MD   Encounter Date: 12/30/2017  PT End of Session - 12/30/17 0851    Visit Number  2    Number of Visits  13    Date for PT Re-Evaluation  02/23/18    PT Start Time  0816   short session due to patient 15 minutes late,  heat   PT Stop Time  0900    PT Time Calculation (min)  44 min    Activity Tolerance  Patient tolerated treatment well    Behavior During Therapy  [facility] for tasks assessed/performed       Past Medical History:  Diagnosis Date  . Colon polyp    adenomatous  . Diabetes mellitus without complication ([place])   . Hyperlipidemia   . Hypertension   . Stroke ([place])   . Thyroid disease     Past Surgical History:  Procedure Laterality Date  . ABDOMINAL HYSTERECTOMY    . BLADDER SUSPENSION    . EUS N/A 08/12/2013   Procedure: LOWER ENDOSCOPIC ULTRASOUND (EUS);  Surgeon: Milus Banister, MD;  Location: [facility];  Service: Endoscopy;  Laterality: N/A;  . KNEE SURGERY    . TUBAL LIGATION      There were no vitals filed for this visit.  Subjective Assessment - 12/30/17 0818    Subjective  I moved furniture yesterday and Iam a little sore.  Has been doing the HEP.  I work them when i am just sitting there.      Currently in Pain?  Yes    Pain Score  6     Pain Location  Leg    Pain Orientation  Right    Pain Descriptors / Indicators  Aching;Sore    Pain Radiating Towards  groin and posteriod distal hamstrings.    Pain Frequency  Constant    Aggravating Factors   moving in and out of car.   Lifting leg onto cushions while sitting on the couch     Pain Relieving Factors  heat    Multiple Pain Sites  --   left knee hurts some with moving furnature,  back pain 0 now.   better with heat.  Pressure with walking , standing too long,  washing dishes.                        [place] Adult PT Treatment/Exercise - 12/30/17 0001      Self-Care   Self-Care  ADL's   handout issued,  not reviewed.      Knee/Hip Exercises: Stretches   Sports administrator Limitations  prone,  PROM      Knee/Hip Exercises: Aerobic   Nustep  8 minutes L 5,  UE  LE.  Knee ROm gradually improved ,  Less pain.       Moist Heat Therapy   Number Minutes Moist Heat  15 Minutes    Moist Heat Location  Knee   posterior     Ultrasound   Ultrasound Location  knee, distal hamstrings    Ultrasound Parameters  1.5 watts/cm2,  1 Mhz    Ultrasound Goals  Pain;Edema      Manual Therapy   Manual Therapy  Soft tissue mobilization    Soft tissue mobilization  distal to medial posterior thigh, retrograde.  Tissue softened, some             PT Education - 12/30/17 0851    Education Details  ADL.    Person(s) Educated  Patient    Methods  Explanation;Handout    Comprehension  Verbalized understanding          PT Long Term Goals - 12/24/17 1010      PT LONG TERM GOAL #1   Title  Pt will be I and compliant with HEP and progression.    Baseline   HEP for LE started on 12/24/17    Time  6    Period  Weeks    Status  New    Target Date  02/06/18      PT LONG TERM GOAL #2   Title  Pt will be able to perform sit to stand in less than 10 seconds using UE support as needed.     Baseline  sit to stand in 40 seconds using multiple attemps to stand.     Time  6    Period  Weeks    Status  New    Target Date  02/06/18      PT LONG TERM GOAL #3   Title  Pt will improve her FOTO score from 53% limitaiton to </= 43% limiation.     Time  6    Period  Weeks    Status  New    Target Date  02/06/18      PT LONG TERM GOAL #4   Title  Pt will be able to amb on community surfaces >/= 1000 feet with pain </= 3/10 in her R LE.     Baseline  pain with amb 7-8/10.     Time  6     Period  Weeks    Status  New    Target Date  02/06/18      PT LONG TERM GOAL #5   Title  Pt will be able to step up and down off a curb safely.     Baseline  unable without assistance    Time  6    Period  Weeks    Status  New    Target Date  02/06/18            Plan - 12/30/17 3154    Clinical Impression Statement  Short session due to patient late.  She is not yet consistant with HEP.  Pain  and ROM addressed with exercise and modalities/ manual techniques.  Decreased pain/ stiffness noted knee.      PT Next Visit Plan  Nustep assess treatment, knee ROM and strengthening, hamstring stretching, hip flexor stretching    PT Home Exercise Plan  sit to stand, self massage using tennis ball on IT band and quad, hamstring stretch    Consulted and Agree with Plan of Care  Patient       Patient will benefit from skilled therapeutic intervention in order to improve the following deficits and impairments:     Visit Diagnosis: Chronic pain of right knee  Pain in right lower leg  Difficulty in walking, not elsewhere classified  Muscle weakness (generalized)     Problem List Patient Active Problem List   Diagnosis Date Noted  . OSA (obstructive sleep apnea) 02/03/2017  . Hypothyroidism 02/02/2017  . Chest pain 02/02/2017  . Morbid obesity ([place]) 01/27/2017  . Plantar fasciitis of left foot 11/08/2015  .  Non-insulin treated type 2 diabetes mellitus ([place]) 06/08/2015  . Colon cancer screening 06/08/2015  . Bilateral leg edema 12/15/2014  . Blurry vision, bilateral 06/16/2014  . Breast cancer screening 06/16/2014  . Other specified hypothyroidism 06/16/2014  . Other seasonal allergic rhinitis 06/16/2014  . History of stroke 01/31/2014  . Essential hypertension 08/24/2013  . Nonspecific (abnormal) findings on radiological and other examination of gastrointestinal tract 08/12/2013  . Lateral epicondylitis of right elbow 07/21/2013  . Right shoulder pain 07/21/2013  . Sprain  of wrist, right 07/21/2013  . Unspecified constipation 04/15/2013  . Rectal bleeding 04/15/2013  . HTN (hypertension) 01/15/2013  . Diabetes ([place]) 08/10/2012    Mahari Vankirk PTA 12/30/2017, 8:55 AM  [facility] [address] [place], [place], [address] Phone: [phone]   Fax:  [phone]  Name: Tiffany Strickland MRN: [id number] Date of Birth: [birth year]

## 2017-12-30 NOTE — Patient Instructions (Signed)

## 2018-01-02 ENCOUNTER — Encounter

## 2018-01-05 ENCOUNTER — Ambulatory Visit: Payer: Self-pay | Admitting: Physical Therapy

## 2018-01-06 ENCOUNTER — Encounter: Payer: Self-pay | Admitting: Physical Therapy

## 2018-01-06 ENCOUNTER — Ambulatory Visit: Payer: Self-pay | Admitting: Physical Therapy

## 2018-01-06 DIAGNOSIS — R262 Difficulty in walking, not elsewhere classified: Secondary | ICD-10-CM

## 2018-01-06 DIAGNOSIS — M25561 Pain in right knee: Principal | ICD-10-CM

## 2018-01-06 DIAGNOSIS — M6281 Muscle weakness (generalized): Secondary | ICD-10-CM

## 2018-01-06 DIAGNOSIS — G8929 Other chronic pain: Secondary | ICD-10-CM

## 2018-01-06 DIAGNOSIS — M79661 Pain in right lower leg: Secondary | ICD-10-CM

## 2018-01-06 NOTE — Therapy (Signed)
[place], [place], [address] Phone: [phone]   Fax:  [phone]  Physical Therapy Treatment  Patient Details  Name: Tiffany Strickland MRN: [id number] Date of Birth: 07/29/1955 Referring Provider (PT): Eunice Blase, MD   Encounter Date: 01/06/2018  PT End of Session - 01/06/18 0933    Visit Number  3    Number of Visits  13    Date for PT Re-Evaluation  02/23/18    PT Start Time  0928    PT Stop Time  1013    PT Time Calculation (min)  45 min    Activity Tolerance  Patient tolerated treatment well    Behavior During Therapy  [facility] for tasks assessed/performed       Past Medical History:  Diagnosis Date  . Colon polyp    adenomatous  . Diabetes mellitus without complication ([place])   . Hyperlipidemia   . Hypertension   . Stroke ([place])   . Thyroid disease     Past Surgical History:  Procedure Laterality Date  . ABDOMINAL HYSTERECTOMY    . BLADDER SUSPENSION    . EUS N/A 08/12/2013   Procedure: LOWER ENDOSCOPIC ULTRASOUND (EUS);  Surgeon: Milus Banister, MD;  Location: [facility];  Service: Endoscopy;  Laterality: N/A;  . KNEE SURGERY    . TUBAL LIGATION      There were no vitals filed for this visit.  Subjective Assessment - 01/06/18 0925    Subjective  Pt reporting 5/10 pain in her R knee and posterior leg. Pt reporting doing more moving and walking at home. Pt reporting taking her mom to [employer] and doing lots of walking yesterday.     Pertinent History  PMH: DM,hypothyroid,CVA 2005, she denies any deficits form this, HTN    Limitations  Walking;Standing    How long can you stand comfortably?  5 minutes, then the knee and lower back begin to hurt    How long can you walk comfortably?  5 minutes    Diagnostic tests  no recent imaging of knee    Patient Stated Goals  stop hurting, be able to walk without pain,     Currently in Pain?  Yes    Pain Score  5     Pain Location  Knee    Pain  Orientation  Right    Pain Descriptors / Indicators  Aching;Sore    Pain Type  Chronic pain    Pain Onset  More than a month ago                       [facility] Adult PT Treatment/Exercise - 01/06/18 0001      Exercises   Exercises  Knee/Hip      Knee/Hip Exercises: Aerobic   Nustep  8 minutes L 5,  UE  LE.  Knee ROm gradually improved ,  Less pain.       Knee/Hip Exercises: Seated   Long Arc Quad  Strengthening;Both;20 reps    [employer]  x 20 reps    Other Seated Knee/Hip Exercises  ankle pumps x 20 reps    Marching  Strengthening;20 reps    Sit to [employer]  5 reps      Moist Heat Therapy   Number Minutes Moist Heat  10 Minutes    Moist Heat Location  Knee      Ultrasound   Ultrasound Location  knee distal hamstring    Ultrasound Parameters  1.5 w/cm2, 1Mhz, x 12 minutes    Ultrasound Goals  Edema;Pain      Manual Therapy   Manual Therapy  Soft tissue mobilization    Soft tissue mobilization  distal to medial posterior thigh, retrograde.  Tissue softened, some             PT Education - 01/06/18 1016    Education Details  sitting exercises to do while on the bus when having to sit for longer periods, pt also edu about proper hygience and to follow up with her primary physician due to foul smelling urine per pt and odor noted during therapy session.     Person(s) Educated  Patient    Methods  Other (comment);Explanation;Demonstration    Comprehension  Verbalized understanding;Returned demonstration          PT Long Term Goals - 12/24/17 1010      PT LONG TERM GOAL #1   Title  Pt will be I and compliant with HEP and progression.    Baseline   HEP for LE started on 12/24/17    Time  6    Period  Weeks    Status  New    Target Date  02/06/18      PT LONG TERM GOAL #2   Title  Pt will be able to perform sit to stand in less than 10 seconds using UE support as needed.     Baseline  sit to stand in 40 seconds using multiple attemps to stand.      Time  6    Period  Weeks    Status  New    Target Date  02/06/18      PT LONG TERM GOAL #3   Title  Pt will improve her FOTO score from 53% limitaiton to </= 43% limiation.     Time  6    Period  Weeks    Status  New    Target Date  02/06/18      PT LONG TERM GOAL #4   Title  Pt will be able to amb on community surfaces >/= 1000 feet with pain </= 3/10 in her R LE.     Baseline  pain with amb 7-8/10.     Time  6    Period  Weeks    Status  New    Target Date  02/06/18      PT LONG TERM GOAL #5   Title  Pt will be able to step up and down off a curb safely.     Baseline  unable without assistance    Time  6    Period  Weeks    Status  New    Target Date  02/06/18            Plan - 01/06/18 0933    Clinical Impression Statement  Pt tolerating all exercises with rest breaks for fatigue. Pt reported she has not been doing her HEP consistently due to increased walking. Pt was instructed in seated exercises and recommended when she is having to sit for longer periods.  pt with normnl response to [place] and reporting less pain at end of session of 3/10. Continue skilled PT to progress toward goals set.     Rehab Potential  Good    Clinical Impairments Affecting Rehab Potential  potential financial restrictions    PT Frequency  2x / week    PT Duration  6 weeks    PT Treatment/Interventions  Cryotherapy;Electrical Stimulation;Moist Heat;Ultrasound;Therapeutic exercise;Therapeutic activities;Neuromuscular re-education;Patient/family education;Manual techniques;Passive range of motion;Dry needling;Taping;Joint Manipulations;ADLs/Self Care Home Management;Balance training;Functional mobility training;Gait training;Stair training    PT Next Visit Plan  Nustep assess treatment, knee ROM and strengthening, hamstring stretching, hip flexor stretching    PT Home Exercise Plan  sit to stand, self massage using tennis ball on IT band and quad, hamstring stretch    Consulted and Agree with Plan  of Care  Patient       Patient will benefit from skilled therapeutic intervention in order to improve the following deficits and impairments:  Decreased activity tolerance, Decreased endurance, Decreased range of motion, Decreased strength, Impaired perceived functional ability, Impaired flexibility, Increased fascial restricitons, Increased muscle spasms, Pain, Postural dysfunction, Obesity  Visit Diagnosis: Chronic pain of right knee  Pain in right lower leg  Difficulty in walking, not elsewhere classified  Muscle weakness (generalized)     Problem List Patient Active Problem List   Diagnosis Date Noted  . OSA (obstructive sleep apnea) 02/03/2017  . Hypothyroidism 02/02/2017  . Chest pain 02/02/2017  . Morbid obesity ([place]) 01/27/2017  . Plantar fasciitis of left foot 11/08/2015  . Non-insulin treated type 2 diabetes mellitus ([place]) 06/08/2015  . Colon cancer screening 06/08/2015  . Bilateral leg edema 12/15/2014  . Blurry vision, bilateral 06/16/2014  . Breast cancer screening 06/16/2014  . Other specified hypothyroidism 06/16/2014  . Other seasonal allergic rhinitis 06/16/2014  . History of stroke 01/31/2014  . Essential hypertension 08/24/2013  . Nonspecific (abnormal) findings on radiological and other examination of gastrointestinal tract 08/12/2013  . Lateral epicondylitis of right elbow 07/21/2013  . Right shoulder pain 07/21/2013  . Sprain of wrist, right 07/21/2013  . Unspecified constipation 04/15/2013  . Rectal bleeding 04/15/2013  . HTN (hypertension) 01/15/2013  . Diabetes ([place]) 08/10/2012    Oretha Caprice, PT 01/06/2018, 10:25 AM  [facility] [address] [place], [place], [address] Phone: [phone]   Fax:  [phone]  Name: Tiffany Strickland MRN: [id number] Date of Birth: [birth year]

## 2018-01-07 ENCOUNTER — Encounter (HOSPITAL_COMMUNITY): Payer: Self-pay

## 2018-01-07 ENCOUNTER — Ambulatory Visit (HOSPITAL_COMMUNITY)
Admission: EM | Admit: 2018-01-07 | Discharge: 2018-01-07 | Disposition: A | Discharge status: Home / Self Care | Payer: No Typology Code available for payment source | Attending: Family Medicine | Admitting: Family Medicine

## 2018-01-07 DIAGNOSIS — Z8673 Personal history of transient ischemic attack (TIA), and cerebral infarction without residual deficits: Secondary | ICD-10-CM | POA: Insufficient documentation

## 2018-01-07 DIAGNOSIS — R8290 Unspecified abnormal findings in urine: Secondary | ICD-10-CM | POA: Insufficient documentation

## 2018-01-07 DIAGNOSIS — I10 Essential (primary) hypertension: Secondary | ICD-10-CM | POA: Insufficient documentation

## 2018-01-07 DIAGNOSIS — Z79899 Other long term (current) drug therapy: Secondary | ICD-10-CM | POA: Insufficient documentation

## 2018-01-07 DIAGNOSIS — Z791 Long term (current) use of non-steroidal anti-inflammatories (NSAID): Secondary | ICD-10-CM | POA: Insufficient documentation

## 2018-01-07 DIAGNOSIS — G4733 Obstructive sleep apnea (adult) (pediatric): Secondary | ICD-10-CM | POA: Insufficient documentation

## 2018-01-07 DIAGNOSIS — E038 Other specified hypothyroidism: Secondary | ICD-10-CM | POA: Insufficient documentation

## 2018-01-07 DIAGNOSIS — Z7982 Long term (current) use of aspirin: Secondary | ICD-10-CM | POA: Insufficient documentation

## 2018-01-07 DIAGNOSIS — E119 Type 2 diabetes mellitus without complications: Secondary | ICD-10-CM | POA: Insufficient documentation

## 2018-01-07 DIAGNOSIS — R202 Paresthesia of skin: Secondary | ICD-10-CM | POA: Insufficient documentation

## 2018-01-07 DIAGNOSIS — E785 Hyperlipidemia, unspecified: Secondary | ICD-10-CM | POA: Insufficient documentation

## 2018-01-07 LAB — POCT URINALYSIS DIP (DEVICE)
Bilirubin Urine: NEGATIVE
GLUCOSE, UA: NEGATIVE mg/dL
Hgb urine dipstick: NEGATIVE
KETONES UR: NEGATIVE mg/dL
LEUKOCYTES UA: NEGATIVE
Nitrite: NEGATIVE
PROTEIN: NEGATIVE mg/dL
Specific Gravity, Urine: 1.01 (ref 1.005–1.030)
Urobilinogen, UA: 0.2 mg/dL (ref 0.0–1.0)
pH: 5.5 (ref 5.0–8.0)

## 2018-01-07 MED ORDER — NAPROXEN 375 MG PO TABS: 375.0000 mg | ORAL_TABLET | Freq: Two times a day (BID) | ORAL | 0 refills | Status: DC

## 2018-01-07 NOTE — Discharge Instructions (Addendum)
Odor Your urine was completely normal Odor may be from vaginal causes, we will send the swab off to check for yeast and BV as causes of the odor We will call you with the results if anything is positive and send in any medicines needed Please follow-up if developing rash, discharge, burning with urination  Right wrist numbness Likely from nerve irritation at rest Please wear a wrist brace to prevent motions that could trigger pain Please use anti-inflammatories like Tylenol, ibuprofen or Naprosyn provided  Please follow-up if symptoms not resolving, worsening, developing weakness, persistent numbness, headache or vision changes

## 2018-01-07 NOTE — ED Triage Notes (Signed)
Pt right hand has been getting numb and tingling this has been going on for over a 1wk  Pt notice an odor when she urinate, along with some itching and burning.

## 2018-01-08 NOTE — ED Provider Notes (Signed)
Tiffany Strickland    CSN: [id number] Arrival date & time: 01/07/18  1708     History   Chief Complaint Chief Complaint  Patient presents with  . Numbness    Right hand   . Urinary Frequency    urine has an odor     HPI Tiffany Strickland is a 62 y.o. female history of hypertension, hyperlipidemia, DM type II, previous stroke presenting today for evaluation of right hand numbness and tingling as well as odor with with urination.  Patient states that over the past week she has had occasional numbness and tingling in the tips of her fingers, occasionally will radiate up her hand.  Denies worsening of symptoms with particular movements, will happen at rest.  States that she feels the sensation throughout all 5 fingers.  Denies any injury.  Denies any increase in activity or repetitive movements with hand.  Denies weakness in hand or arm.  Denies lightheadedness or dizziness.  Denies changes in vision or headache.  Denies sensations on the left side.  She has not tried anything for her symptoms, but takes tumeric for her knees.  Patient is also concerned about odor with urination.  Is been going on for approximately 1 week.  She denies any dysuria or increase in frequency or urgency.  She has had some mild itching, but denies any abnormal vaginal discharge.  Denies history of yeast or BV.  HPI  Past Medical History:  Diagnosis Date  . Colon polyp    adenomatous  . Diabetes mellitus without complication ([place])   . Hyperlipidemia   . Hypertension   . Stroke ([place])   . Thyroid disease     Patient Active Problem List   Diagnosis Date Noted  . OSA (obstructive sleep apnea) 02/03/2017  . Hypothyroidism 02/02/2017  . Chest pain 02/02/2017  . Morbid obesity ([place]) 01/27/2017  . Plantar fasciitis of left foot 11/08/2015  . Non-insulin treated type 2 diabetes mellitus ([employer]) 06/08/2015  . Colon cancer screening 06/08/2015  . Bilateral leg edema 12/15/2014  . Blurry vision, bilateral  06/16/2014  . Breast cancer screening 06/16/2014  . Other specified hypothyroidism 06/16/2014  . Other seasonal allergic rhinitis 06/16/2014  . History of stroke 01/31/2014  . Essential hypertension 08/24/2013  . Nonspecific (abnormal) findings on radiological and other examination of gastrointestinal tract 08/12/2013  . Lateral epicondylitis of right elbow 07/21/2013  . Right shoulder pain 07/21/2013  . Sprain of wrist, right 07/21/2013  . Unspecified constipation 04/15/2013  . Rectal bleeding 04/15/2013  . HTN (hypertension) 01/15/2013  . Diabetes ([place]) 08/10/2012    Past Surgical History:  Procedure Laterality Date  . ABDOMINAL HYSTERECTOMY    . BLADDER SUSPENSION    . EUS N/A 08/12/2013   Procedure: LOWER ENDOSCOPIC ULTRASOUND (EUS);  Surgeon: Milus Banister, MD;  Location: [facility];  Service: Endoscopy;  Laterality: N/A;  . KNEE SURGERY    . TUBAL LIGATION      OB History    Gravida  6   Para  5   Term  5   Preterm      AB  1   Living  5     SAB  1   TAB      Ectopic      Multiple      Live Births               Home Medications    Prior to Admission medications   Medication Sig Start Date End  Date Taking? Authorizing Provider  aspirin (ASPIR-81) 81 MG EC tablet Take 1 tablet (81 mg total) by mouth daily. Swallow whole. 11/08/15   Charlott Rakes, MD  Blood Glucose Monitoring Suppl (TRUE METRIX METER) DEVI 1 each by Does not apply route daily before breakfast. 06/02/17   Charlott Rakes, MD  glucose blood test strip Use 1 time daily before breakfast 05/26/17   Charlott Rakes, MD  ibuprofen (ADVIL,MOTRIN) 200 MG tablet Take 400 mg by mouth every 6 (six) hours as needed for mild pain.    [provider]  isosorbide mononitrate (IMDUR) 30 MG 24 hr tablet Take 1 tablet (30 mg total) by mouth daily. 11/06/17 02/04/18  Charlott Rakes, MD  levothyroxine (SYNTHROID, LEVOTHROID) 137 MCG tablet Take 137 mcg by mouth daily before breakfast.     [provider]  levothyroxine (SYNTHROID, LEVOTHROID) 150 MCG tablet TAKE 1 TABLET BY MOUTH DAILY BEFORE BREAKFAST. 11/07/17   Charlott Rakes, MD  losartan-hydrochlorothiazide (HYZAAR) 100-25 MG tablet Take 1 tablet by mouth daily. 11/06/17   Charlott Rakes, MD  naproxen (NAPROSYN) 375 MG tablet Take 1 tablet (375 mg total) by mouth 2 (two) times daily. 01/07/18   ,  C, PA-C  nitroGLYCERIN (NITROSTAT) 0.4 MG SL tablet Place 1 tablet (0.4 mg total) under the tongue every 5 (five) minutes as needed for chest pain. 02/03/17   Rosita Fire, MD  polyethylene glycol powder (GLYCOLAX/MIRALAX) powder TAKE 17 GRAMS WITH BY MOUTH DAILY. Patient taking differently: Take 17 g by mouth daily as needed for mild constipation.  06/02/17   Charlott Rakes, MD  TRUEPLUS LANCETS 28G MISC USE AS DIRECTED DAILY BEFORE BREAKFAST. 02/12/17   Charlott Rakes, MD    Family History Family History  Problem Relation Age of Onset  . Hypertension Mother   . Heart disease Maternal Grandmother   . Cancer Maternal Grandmother        ovarian  . Cancer Maternal Grandfather   . Hypertension Other   . Hyperlipidemia Other   . Cancer Other   . Sleep apnea Other   . Obesity Other     Social History Social History   Tobacco Use  . Smoking status: Never Smoker  . Smokeless tobacco: Never Used  Substance Use Topics  . Alcohol use: No  . Drug use: No     Allergies   Pollen extract and Tomato   Review of Systems Review of Systems  Constitutional: Negative for fever.  Respiratory: Negative for shortness of breath.   Cardiovascular: Negative for chest pain.  Gastrointestinal: Negative for abdominal pain, diarrhea, nausea and vomiting.  Genitourinary: Negative for dysuria, flank pain, frequency, genital sores, hematuria, menstrual problem, urgency, vaginal bleeding, vaginal discharge and vaginal pain.  Musculoskeletal: Negative for back pain.  Skin: Negative for color change and rash.    Neurological: Positive for numbness. Negative for dizziness, weakness, light-headedness and headaches.     Physical Exam Triage Vital Signs ED Triage Vitals  Enc Vitals Group     BP 01/07/18 1746 (!) 146/73     Pulse Rate 01/07/18 1746 77     Resp 01/07/18 1746 16     Temp 01/07/18 1746 98.6 F (37 C)     Temp Source 01/07/18 1746 Oral     SpO2 01/07/18 1746 99 %     Weight --      Height --      Head Circumference --      Peak Flow --      Pain Score  01/07/18 1748 0     Pain Loc --      Pain Edu? --      Excl. in [place]? --    No data found.  Updated Vital Signs BP (!) 146/73 (BP Location: Left Arm)   Pulse 77   Temp 98.6 F (37 C) (Oral)   Resp 16   SpO2 99%   Visual Acuity Right Eye Distance:   Left Eye Distance:   Bilateral Distance:    Right Eye Near:   Left Eye Near:    Bilateral Near:     Physical Exam  Constitutional: She is oriented to person, place, and time. She appears well-developed and well-nourished.  No acute distress  HENT:  Head: Normocephalic and atraumatic.  Nose: Nose normal.  Eyes: Conjunctivae are normal.  Neck: Neck supple.  Cardiovascular: Normal rate.  Pulmonary/Chest: Effort normal. No respiratory distress.  Abdominal: Soft. She exhibits no distension. There is no tenderness.  nontender to light and deep palpation throughout abdomen  Musculoskeletal: Normal range of motion.  Right hand/arm: Full active range of motion of fingers, wrist and elbow on right side, strength 5/5 and equal bilaterally at shoulders, elbow, wrist and grip strength.  Sensation intact distally, weakly positive Tinel's, negative Finkelstein's.  Neurological: She is alert and oriented to person, place, and time.  Skin: Skin is warm and dry.  Psychiatric: She has a normal mood and affect.  Nursing note and vitals reviewed.    UC Treatments / Results  Labs (all labs ordered are listed, but only abnormal results are displayed) Labs Reviewed  POCT URINALYSIS  DIP (DEVICE)  CERVICOVAGINAL ANCILLARY ONLY    EKG None  Radiology No results found.  Procedures Procedures (including critical care time)  Medications Ordered in UC Medications - No data to display  Initial Impression / Assessment and Plan / UC Course  I have reviewed the triage vital signs and the nursing notes.  Pertinent labs & imaging results that were available during my care of the patient were reviewed by me and considered in my medical decision making (see chart for details).     UA negative for signs of infection, will obtain swab to check for yeast or BV as cause of odor and itching.  Will defer treatment until results obtained.  Will call patient with results and provide treatment if needed.  Push fluids.  Patient with numbness and tingling in right hand, strength intact, not concerning for stroke or other cause of weakness at this time, likely paresthesias related to overuse versus carpal tunnel.  Will provide wrist brace to prevent extreme motions of wrist as well as anti-inflammatories.  Discussed prednisone versus NSAIDs.  Patient opted for NSAIDs as she did not want to raise her blood sugars.  Continue to monitor symptoms,Discussed strict return precautions. Patient verbalized understanding and is agreeable with plan.  Final Clinical Impressions(s) / UC Diagnoses   Final diagnoses:  Bad odor of urine  Paresthesia     Discharge Instructions     Odor Your urine was completely normal Odor may be from vaginal causes, we will send the swab off to check for yeast and BV as causes of the odor We will call you with the results if anything is positive and send in any medicines needed Please follow-up if developing rash, discharge, burning with urination  Right wrist numbness Likely from nerve irritation at rest Please wear a wrist brace to prevent motions that could trigger pain Please use anti-inflammatories like Tylenol, ibuprofen  or Naprosyn provided  Please  follow-up if symptoms not resolving, worsening, developing weakness, persistent numbness, headache or vision changes   ED Prescriptions    Medication Sig Dispense Auth. Provider   naproxen (NAPROSYN) 375 MG tablet Take 1 tablet (375 mg total) by mouth 2 (two) times daily. 20 tablet , [address] C, PA-C     Controlled Substance Prescriptions [place] Controlled Substance Registry consulted? Not Applicable   Janith Lima, [place] 01/08/18 1138

## 2018-01-09 ENCOUNTER — Telehealth (HOSPITAL_COMMUNITY): Payer: Self-pay | Admitting: Emergency Medicine

## 2018-01-09 LAB — CERVICOVAGINAL ANCILLARY ONLY
Bacterial vaginitis: NEGATIVE
Candida vaginitis: POSITIVE — AB

## 2018-01-09 MED ORDER — FLUCONAZOLE 150 MG PO TABS: 150.0000 mg | ORAL_TABLET | Freq: Once | ORAL | 0 refills | Status: AC

## 2018-01-09 MED ORDER — FLUCONAZOLE 150 MG PO TABS: 150.0000 mg | ORAL_TABLET | Freq: Once | ORAL | 0 refills | Status: DC

## 2018-01-09 NOTE — Telephone Encounter (Signed)
Tested positive for yeast, Rx sent

## 2018-01-09 NOTE — Telephone Encounter (Signed)
Rx for diflucan sent to different pharmacy

## 2018-01-12 MED FILL — FLUCONAZOLE 150 MG TABS: 150 | 2 days supply | Qty: 2 | Fill #0

## 2018-01-12 MED FILL — NAPROXEN 375 MG TABLET: 375 | 10 days supply | Qty: 20 | Fill #0

## 2018-01-14 ENCOUNTER — Ambulatory Visit: Payer: Self-pay | Attending: Family Medicine | Admitting: Physical Therapy

## 2018-01-14 DIAGNOSIS — M79661 Pain in right lower leg: Secondary | ICD-10-CM | POA: Insufficient documentation

## 2018-01-14 DIAGNOSIS — R262 Difficulty in walking, not elsewhere classified: Secondary | ICD-10-CM | POA: Insufficient documentation

## 2018-01-14 DIAGNOSIS — G8929 Other chronic pain: Secondary | ICD-10-CM | POA: Insufficient documentation

## 2018-01-14 DIAGNOSIS — M6281 Muscle weakness (generalized): Secondary | ICD-10-CM | POA: Insufficient documentation

## 2018-01-14 DIAGNOSIS — M25561 Pain in right knee: Secondary | ICD-10-CM | POA: Insufficient documentation

## 2018-01-14 MED FILL — ISOSORBIDE MN ER 30 MG TAB: 30 | 30 days supply | Qty: 30 | Fill #0

## 2018-01-14 MED FILL — LOSARTAN-HCTZ 100-25 MG TAB: 100-25 | 30 days supply | Qty: 30 | Fill #2

## 2018-01-14 MED FILL — ?LEVOTHYROXINE 150 MCG TAB: 150 | 30 days supply | Qty: 30 | Fill #1

## 2018-01-14 NOTE — Therapy (Signed)
[place], [place], [address] Phone: [phone]   Fax:  [phone]  Physical Therapy Treatment  Patient Details  Name: Tiffany Strickland MRN: [id number] Date of Birth: Oct 28, 1955 Referring Provider (PT): Eunice Blase, MD   Encounter Date: 01/14/2018  PT End of Session - 01/14/18 0938    Visit Number  4    Number of Visits  13    Date for PT Re-Evaluation  02/23/18    PT Start Time  0930    PT Stop Time  3009    PT Time Calculation (min)  45 min       Past Medical History:  Diagnosis Date  . Colon polyp    adenomatous  . Diabetes mellitus without complication ([place])   . Hyperlipidemia   . Hypertension   . Stroke ([place])   . Thyroid disease     Past Surgical History:  Procedure Laterality Date  . ABDOMINAL HYSTERECTOMY    . BLADDER SUSPENSION    . EUS N/A 08/12/2013   Procedure: LOWER ENDOSCOPIC ULTRASOUND (EUS);  Surgeon: Milus Banister, MD;  Location: [facility];  Service: Endoscopy;  Laterality: N/A;  . KNEE SURGERY    . TUBAL LIGATION      There were no vitals filed for this visit.  Subjective Assessment - 01/14/18 0938    Subjective  Not doing so good today. My right thigh is hurting when I lift my right leg.     Pertinent History  PMH: DM,hypothyroid,CVA 2005, she denies any deficits form this, HTN    Currently in Pain?  No/denies                       [facility] Adult PT Treatment/Exercise - 01/14/18 0001      Exercises   Exercises  Knee/Hip      Knee/Hip Exercises: Stretches   Active Hamstring Stretch  3 reps;30 seconds    Active Hamstring Stretch Limitations  EOM    Passive Hamstring Stretch  2 reps;30 seconds    Hip Flexor Stretch  Right;1 rep;60 seconds    Hip Flexor Stretch Limitations  edge of mat     Gastroc Stretch  3 reps;20 seconds    Gastroc Stretch Limitations  runners stretch at counter    Other Knee/Hip Stretches  butterfly stretch, clam AROM       Knee/Hip Exercises: Aerobic   Nustep  8 minutes L 5,  UE  LE.  Knee ROm gradually improved ,  Less pain.       Knee/Hip Exercises: Standing   Heel Raises  10 reps;2 sets    Knee Flexion  10 reps;Right;Left    Hip Flexion  1 set;10 reps;Right;Left      Knee/Hip Exercises: Seated   Long Arc Quad  Strengthening;Both;20 reps    Sit to [employer]  10 reps;without UE support             PT Education - 01/14/18 1015    Education Details  HEP    Person(s) Educated  Patient    Methods  Explanation;Handout    Comprehension  Verbalized understanding          PT Long Term Goals - 12/24/17 1010      PT LONG TERM GOAL #1   Title  Pt will be I and compliant with HEP and progression.    Baseline   HEP for LE started on 12/24/17    Time  6  Period  Weeks    Status  New    Target Date  02/06/18      PT LONG TERM GOAL #2   Title  Pt will be able to perform sit to stand in less than 10 seconds using UE support as needed.     Baseline  sit to stand in 40 seconds using multiple attemps to stand.     Time  6    Period  Weeks    Status  New    Target Date  02/06/18      PT LONG TERM GOAL #3   Title  Pt will improve her FOTO score from 53% limitaiton to </= 43% limiation.     Time  6    Period  Weeks    Status  New    Target Date  02/06/18      PT LONG TERM GOAL #4   Title  Pt will be able to amb on community surfaces >/= 1000 feet with pain </= 3/10 in her R LE.     Baseline  pain with amb 7-8/10.     Time  6    Period  Weeks    Status  New    Target Date  02/06/18      PT LONG TERM GOAL #5   Title  Pt will be able to step up and down off a curb safely.     Baseline  unable without assistance    Time  6    Period  Weeks    Status  New    Target Date  02/06/18            Plan - 01/14/18 1015    Clinical Impression Statement  Pt reports pain only with right hip flexion. Added stretches for RLE to HEP. She felt better after treatment.     PT Next Visit Plan  Nustep  assess treatment, knee ROM and strengthening, hamstring stretching, hip flexor stretching, review new stretches, massage roller/STW to right thigh, lateral, anterior, and adductors    PT Home Exercise Plan  sit to stand, self massage using tennis ball on IT band and quad, hamstring stretch, supine butterfly, supine hip flexor dangle    Consulted and Agree with Plan of Care  Patient       Patient will benefit from skilled therapeutic intervention in order to improve the following deficits and impairments:  Decreased activity tolerance, Decreased endurance, Decreased range of motion, Decreased strength, Impaired perceived functional ability, Impaired flexibility, Increased fascial restricitons, Increased muscle spasms, Pain, Postural dysfunction, Obesity  Visit Diagnosis: Chronic pain of right knee  Pain in right lower leg  Difficulty in walking, not elsewhere classified  Muscle weakness (generalized)     Problem List Patient Active Problem List   Diagnosis Date Noted  . OSA (obstructive sleep apnea) 02/03/2017  . Hypothyroidism 02/02/2017  . Chest pain 02/02/2017  . Morbid obesity ([place]) 01/27/2017  . Plantar fasciitis of left foot 11/08/2015  . Non-insulin treated type 2 diabetes mellitus ([place]) 06/08/2015  . Colon cancer screening 06/08/2015  . Bilateral leg edema 12/15/2014  . Blurry vision, bilateral 06/16/2014  . Breast cancer screening 06/16/2014  . Other specified hypothyroidism 06/16/2014  . Other seasonal allergic rhinitis 06/16/2014  . History of stroke 01/31/2014  . Essential hypertension 08/24/2013  . Nonspecific (abnormal) findings on radiological and other examination of gastrointestinal tract 08/12/2013  . Lateral epicondylitis of right elbow 07/21/2013  . Right shoulder pain 07/21/2013  .  Sprain of wrist, right 07/21/2013  . Unspecified constipation 04/15/2013  . Rectal bleeding 04/15/2013  . HTN (hypertension) 01/15/2013  . Diabetes ([place]) 08/10/2012     Dorene Ar, PTA 01/14/2018, 10:30 AM  [facility] [address] [place], [place], [address] Phone: [phone]   Fax:  [phone]  Name: Tiffany Strickland MRN: [id number] Date of Birth: 04-11-1955

## 2018-01-15 ENCOUNTER — Other Ambulatory Visit: Payer: Self-pay

## 2018-01-15 DIAGNOSIS — I10 Essential (primary) hypertension: Secondary | ICD-10-CM

## 2018-01-15 MED ORDER — ISOSORBIDE MONONITRATE ER 30 MG PO TB24: 30.0000 mg | ORAL_TABLET | Freq: Every day | ORAL | 2 refills | Status: DC

## 2018-01-16 ENCOUNTER — Encounter: Payer: Self-pay | Admitting: Physical Therapy

## 2018-01-16 ENCOUNTER — Ambulatory Visit: Payer: Self-pay | Admitting: Physical Therapy

## 2018-01-16 DIAGNOSIS — R262 Difficulty in walking, not elsewhere classified: Secondary | ICD-10-CM

## 2018-01-16 DIAGNOSIS — G8929 Other chronic pain: Secondary | ICD-10-CM

## 2018-01-16 DIAGNOSIS — M25561 Pain in right knee: Principal | ICD-10-CM

## 2018-01-16 DIAGNOSIS — M79661 Pain in right lower leg: Secondary | ICD-10-CM

## 2018-01-16 DIAGNOSIS — M6281 Muscle weakness (generalized): Secondary | ICD-10-CM

## 2018-01-16 NOTE — Therapy (Signed)
[place], [place], [address] Phone: [phone]   Fax:  [phone]  Physical Therapy Treatment  Patient Details  Name: Tiffany Strickland MRN: [id number] Date of Birth: 06/11/1955 Referring Provider (PT): Eunice Blase, MD   Encounter Date: 01/16/2018  PT End of Session - 01/16/18 0854    Visit Number  5    Number of Visits  13    Date for PT Re-Evaluation  02/23/18    PT Start Time  0849    PT Stop Time  0928    PT Time Calculation (min)  39 min       Past Medical History:  Diagnosis Date  . Colon polyp    adenomatous  . Diabetes mellitus without complication ([place])   . Hyperlipidemia   . Hypertension   . Stroke ([place])   . Thyroid disease     Past Surgical History:  Procedure Laterality Date  . ABDOMINAL HYSTERECTOMY    . BLADDER SUSPENSION    . EUS N/A 08/12/2013   Procedure: LOWER ENDOSCOPIC ULTRASOUND (EUS);  Surgeon: Milus Banister, MD;  Location: [facility];  Service: Endoscopy;  Laterality: N/A;  . KNEE SURGERY    . TUBAL LIGATION      There were no vitals filed for this visit.  Subjective Assessment - 01/16/18 0853    Subjective  Feeling a lot better. No pain now and less pain with lifting right leg.     Currently in Pain?  No/denies                       [facility] Adult PT Treatment/Exercise - 01/16/18 0001      Exercises   Exercises  Knee/Hip      Knee/Hip Exercises: Stretches   Hip Flexor Stretch  Right;1 rep;60 seconds    Hip Flexor Stretch Limitations  edge of mat     Piriformis Stretch Limitations  push and pull 10 sec x 2 each side     Gastroc Stretch  3 reps;20 seconds    Gastroc Stretch Limitations  runners stretch at counter    Other Knee/Hip Stretches  butterfly stretch, clam AROM      Knee/Hip Exercises: Aerobic   Nustep  8 minutes L 5,  UE  LE.       Knee/Hip Exercises: Standing   Heel Raises  10 reps;2 sets    Hip Flexion  1 set;10  reps;Right;Left    Hip Abduction  10 reps;1 set;Both    Hip Extension  10 reps;1 set;Both      Knee/Hip Exercises: Seated   Sit to Sand  10 reps;without UE support      Knee/Hip Exercises: Supine   Bridges  10 reps    Other Supine Knee/Hip Exercises  Supine marching with abdominal brace       Manual Therapy   Passive ROM  ITB stretch, adductor and hamstring stretch                   PT Long Term Goals - 12/24/17 1010      PT LONG TERM GOAL #1   Title  Pt will be I and compliant with HEP and progression.    Baseline   HEP for LE started on 12/24/17    Time  6    Period  Weeks    Status  New    Target Date  02/06/18      PT LONG TERM GOAL #  2   Title  Pt will be able to perform sit to stand in less than 10 seconds using UE support as needed.     Baseline  sit to stand in 40 seconds using multiple attemps to stand.     Time  6    Period  Weeks    Status  New    Target Date  02/06/18      PT LONG TERM GOAL #3   Title  Pt will improve her FOTO score from 53% limitaiton to </= 43% limiation.     Time  6    Period  Weeks    Status  New    Target Date  02/06/18      PT LONG TERM GOAL #4   Title  Pt will be able to amb on community surfaces >/= 1000 feet with pain </= 3/10 in her R LE.     Baseline  pain with amb 7-8/10.     Time  6    Period  Weeks    Status  New    Target Date  02/06/18      PT LONG TERM GOAL #5   Title  Pt will be able to step up and down off a curb safely.     Baseline  unable without assistance    Time  6    Period  Weeks    Status  New    Target Date  02/06/18            Plan - 01/16/18 0856    Clinical Impression Statement  Pt reports significant improvement in pain. She arrives with improved gait mechanics and gait speed. She walked to bus stop farther from her home this morning for extra exercise. Continued with hip ROM and strengthening exercise. Some c/o knee pain/pulling on Nustep and with standing hip flexion.     PT  Next Visit Plan  Nustep assess treatment, knee ROM and strengthening, hamstring stretching, hip flexor stretching, review new stretches, massage roller/STW to right thigh, lateral, anterior, and adductors    PT Home Exercise Plan  sit to stand, self massage using tennis ball on IT band and quad, hamstring stretch, supine butterfly, supine hip flexor dangle    Consulted and Agree with Plan of Care  Patient       Patient will benefit from skilled therapeutic intervention in order to improve the following deficits and impairments:  Decreased activity tolerance, Decreased endurance, Decreased range of motion, Decreased strength, Impaired perceived functional ability, Impaired flexibility, Increased fascial restricitons, Increased muscle spasms, Pain, Postural dysfunction, Obesity  Visit Diagnosis: Chronic pain of right knee  Pain in right lower leg  Difficulty in walking, not elsewhere classified  Muscle weakness (generalized)     Problem List Patient Active Problem List   Diagnosis Date Noted  . OSA (obstructive sleep apnea) 02/03/2017  . Hypothyroidism 02/02/2017  . Chest pain 02/02/2017  . Morbid obesity ([place]) 01/27/2017  . Plantar fasciitis of left foot 11/08/2015  . Non-insulin treated type 2 diabetes mellitus ([place]) 06/08/2015  . Colon cancer screening 06/08/2015  . Bilateral leg edema 12/15/2014  . Blurry vision, bilateral 06/16/2014  . Breast cancer screening 06/16/2014  . Other specified hypothyroidism 06/16/2014  . Other seasonal allergic rhinitis 06/16/2014  . History of stroke 01/31/2014  . Essential hypertension 08/24/2013  . Nonspecific (abnormal) findings on radiological and other examination of gastrointestinal tract 08/12/2013  . Lateral epicondylitis of right elbow 07/21/2013  . Right shoulder pain  07/21/2013  . Sprain of wrist, right 07/21/2013  . Unspecified constipation 04/15/2013  . Rectal bleeding 04/15/2013  . HTN (hypertension) 01/15/2013  . Diabetes  ([place]) 08/10/2012    Dorene Ar, PTA 01/16/2018, 9:29 AM  [facility] [address] [place], [place], [address] Phone: [phone]   Fax:  [phone]  Name: Tiffany Strickland MRN: [id number] Date of Birth: Aug 20, 1955

## 2018-01-19 ENCOUNTER — Ambulatory Visit: Payer: Self-pay | Admitting: Physical Therapy

## 2018-01-20 ENCOUNTER — Ambulatory Visit (INDEPENDENT_AMBULATORY_CARE_PROVIDER_SITE_OTHER): Payer: No Typology Code available for payment source | Admitting: Cardiology

## 2018-01-20 ENCOUNTER — Encounter: Payer: Self-pay | Admitting: Cardiology

## 2018-01-20 VITALS — BP 138/84 | HR 73 | Ht 65.0 in | Wt 265.0 lb

## 2018-01-20 DIAGNOSIS — E119 Type 2 diabetes mellitus without complications: Secondary | ICD-10-CM

## 2018-01-20 DIAGNOSIS — R079 Chest pain, unspecified: Secondary | ICD-10-CM

## 2018-01-20 DIAGNOSIS — G4733 Obstructive sleep apnea (adult) (pediatric): Secondary | ICD-10-CM

## 2018-01-20 DIAGNOSIS — Z8673 Personal history of transient ischemic attack (TIA), and cerebral infarction without residual deficits: Secondary | ICD-10-CM

## 2018-01-20 MED ORDER — METOPROLOL TARTRATE 50 MG PO TABS: ORAL_TABLET | ORAL | 0 refills | Status: DC

## 2018-01-20 MED FILL — METOPROLOL TARTRATE 50 MG T: 50 | 1 days supply | Qty: 1 | Fill #0

## 2018-01-20 NOTE — Assessment & Plan Note (Signed)
EF 60-65% with grade 1 DD by echo 12/18

## 2018-01-20 NOTE — Patient Instructions (Addendum)
Medication Instructions:  Your physician recommends that you continue on your current medications as directed. Please refer to the Current Medication list given to you today.  If you need a refill on your cardiac medications before your next appointment, please call your pharmacy.   Lab work: None  If you have labs (blood work) drawn today and your tests are completely normal, you will receive your results only by: Marland Kitchen MyChart Message (if you have MyChart) OR . A paper copy in the mail If you have any lab test that is abnormal or we need to change your treatment, we will call you to review the results.  Testing/Procedures: Coronary Ct instructions Below THERE WILL BE A ONCE TIME DOSE OF LOPRESSOR THAT YOU WILL TAKE 1 HOUR PRIOR TO TEST COMPLETE LAB WORK 1 WEEK PRIOR TO TEST  Follow-Up: At [facility], you and your health needs are our priority.  As part of our continuing mission to provide you with exceptional heart care, we have created designated Provider Care Teams.  These Care Teams include your primary Cardiologist (physician) and Advanced Practice Providers (APPs -  Physician Assistants and Nurse Practitioners) who all work together to provide you with the care you need, when you need it. . FOLLOW UP IS PENDING THE RESULTS OF THIS TEST   Any Other Special Instructions Will Be Listed Below (If Applicable).   INSTRUCTIONS FOR CORONARY CTA  Please arrive at the [facility] main entrance of [facility] at (30-45 minutes prior to test start time)  [facility] [place], [place] [address] [phone]  Proceed to the [facility] Radiology Department (First Floor).  Please follow these instructions carefully (unless otherwise directed): PLEASE HAVE LABS - BMP  AT LEAST ONE WEEK PRIOR TO TEST  On the Night Before the Test: Drink plenty of water. Do not consume any caffeinated/decaffeinated beverages or chocolate 12 hours prior to your test. Do  not take any antihistamines 12 hours prior to your test.  On the Day of the Test: Drink plenty of water. Do not drink any water within one hour of the test. Do not eat any food 4 hours prior to the test. You may take your regular medications prior to the test. Take 50 mg of Lopressor (Metoprolol) one hour before the test.  After the Test: Drink plenty of water. After receiving IV contrast, you may experience a mild flushed feeling. This is normal. On occasion, you may experience a mild rash up to 24 hours after the test. This is not dangerous. If this occurs, you can take Benadryl 25 mg and increase your fluid intake. If you experience trouble breathing, this can be serious. If it is severe call 911 IMMEDIATELY. If it is mild, please call our office.

## 2018-01-20 NOTE — Assessment & Plan Note (Signed)
BMI 44 

## 2018-01-20 NOTE — Assessment & Plan Note (Signed)
CVA 2008

## 2018-01-20 NOTE — Progress Notes (Signed)
79/03/4095 Tiffany Strickland   [birth year]  [id number]  Primary Physician Charlott Rakes, MD Primary Cardiologist: Dr Percival Spanish  HPI: Tiffany Strickland is a pleasant obese African-American female with a history of a stroke in 2008, sleep apnea with CPAP intolerance, non-insulin-dependent diabetes, and hypertension.  She was admitted in December 2018 with chest pain and we saw her in consult.  At that time she admitted she had been out of some of her medications.  During that admission she ruled out for an MI.  Echocardiogram showed preserved LV function with a PA pressure of 48 mmHg.  She had grade 1 diastolic dysfunction.  Myoview done February 13, 2017 was low risk.  She is seen in the office today with complaints of chest "burning".  She has had this for a couple weeks.  Does not seem to be classically exertional.  She has multiple other complaints including tingling in her right hand and pain in her right leg.  She is been getting physical therapy for these issues and that does not seem to be helping.  She denies any unusual dyspnea on exertion but is not very active.   Current Outpatient Medications  Medication Sig Dispense Refill  . aspirin (ASPIR-81) 81 MG EC tablet Take 1 tablet (81 mg total) by mouth daily. Swallow whole. 30 tablet 12  . Blood Glucose Monitoring Suppl (TRUE METRIX METER) DEVI 1 each by Does not apply route daily before breakfast. 1 Device 0  . glucose blood test strip Use 1 time daily before breakfast 100 each 12  . isosorbide mononitrate (IMDUR) 30 MG 24 hr tablet Take 1 tablet (30 mg total) by mouth daily. 30 tablet 2  . levothyroxine (SYNTHROID, LEVOTHROID) 150 MCG tablet TAKE 1 TABLET BY MOUTH DAILY BEFORE BREAKFAST. 30 tablet 3  . losartan-hydrochlorothiazide (HYZAAR) 100-25 MG tablet Take 1 tablet by mouth daily. 30 tablet 6  . nitroGLYCERIN (NITROSTAT) 0.4 MG SL tablet Place 1 tablet (0.4 mg total) under the tongue every 5 (five) minutes as needed for chest pain. 20  tablet 0  . polyethylene glycol powder (GLYCOLAX/MIRALAX) powder TAKE 17 GRAMS WITH BY MOUTH DAILY. (Patient taking differently: Take 17 g by mouth daily as needed for mild constipation. ) 510 g 3  . TRUEPLUS LANCETS 28G MISC USE AS DIRECTED DAILY BEFORE BREAKFAST. 100 each 12   No current facility-administered medications for this visit.     Allergies  Allergen Reactions  . Pollen Extract Other (See Comments)    Seasonal allergies  . Tomato Itching    Past Medical History:  Diagnosis Date  . Colon polyp    adenomatous  . Diabetes mellitus without complication ([place])   . Hyperlipidemia   . Hypertension   . Stroke ([place])   . Thyroid disease     Social History   Socioeconomic History  . Marital status: Single    Spouse name: Not on file  . Number of children: Not on file  . Years of education: Not on file  . Highest education level: Not on file  Occupational History  . Not on file  Social Needs  . Financial resource strain: Not on file  . Food insecurity:    Worry: Not on file    Inability: Not on file  . Transportation needs:    Medical: Not on file    Non-medical: Not on file  Tobacco Use  . Smoking status: Never Smoker  . Smokeless tobacco: Never Used  Substance and Sexual Activity  . Alcohol  use: No  . Drug use: No  . Sexual activity: Yes    Birth control/protection: Surgical  Lifestyle  . Physical activity:    Days per week: Not on file    Minutes per session: Not on file  . Stress: Not on file  Relationships  . Social connections:    Talks on phone: Not on file    Gets together: Not on file    Attends religious service: Not on file    Active member of club or organization: Not on file    Attends meetings of clubs or organizations: Not on file    Relationship status: Not on file  . Intimate partner violence:    Fear of current or ex partner: Not on file    Emotionally abused: Not on file    Physically abused: Not on file    Forced sexual activity:  Not on file  Other Topics Concern  . Not on file  Social History Narrative  . Not on file     Family History  Problem Relation Age of Onset  . Hypertension Mother   . Heart disease Maternal Grandmother   . Cancer Maternal Grandmother        ovarian  . Cancer Maternal Grandfather   . Hypertension Other   . Hyperlipidemia Other   . Cancer Other   . Sleep apnea Other   . Obesity Other      Review of Systems: General: negative for chills, fever, night sweats or weight changes.  Cardiovascular: negative for chest pain, dyspnea on exertion, edema, orthopnea, palpitations, paroxysmal nocturnal dyspnea or shortness of breath Dermatological: negative for rash Respiratory: negative for cough or wheezing Urologic: negative for hematuria Abdominal: negative for nausea, vomiting, diarrhea, bright red blood per rectum, melena, or hematemesis Neurologic: negative for visual changes, syncope, or dizziness All other systems reviewed and are otherwise negative except as noted above.    Blood pressure 138/84, pulse 73, height 5\' 5"  (1.651 m), weight 265 lb (120.2 kg), SpO2 98 %.  General appearance: alert, cooperative, no distress and morbidly obese Neck: no carotid bruit and no JVD Lungs: clear to auscultation bilaterally Heart: regular rate and rhythm Extremities: no edema Skin: Skin color, texture, turgor normal. No rashes or lesions Neurologic: Grossly normal  EKG NSR, IVCD, poor anterior RW-new from last EKG  ASSESSMENT AND PLAN:   Chest pain 02/03/17- atypical, echo showed normal LVF, Myoview low risk . Recurrent chest "burning" x 2 weeks. She does have new non specific EKG changes (IVCD)  Essential hypertension EF 60-65% with grade 1 DD by echo 12/18  Non-insulin treated type 2 diabetes mellitus ([place]) .  Morbid obesity ([place]) BMI 44  OSA (obstructive sleep apnea) Declined C-pap Feb 2019  History of stroke CVA 2008   PLAN  Discussed with Dr Claiborne Billings (DOD)- will plan  coronary CT FFR. She is on nitrates. F/U depending on results.   Kerin Ransom PA-C 01/20/2018 9:38 AM

## 2018-01-20 NOTE — Assessment & Plan Note (Addendum)
02/03/17- atypical, echo showed normal LVF, Myoview low risk . Recurrent chest "burning" x 2 weeks. She does have new non specific EKG changes (IVCD)

## 2018-01-20 NOTE — Assessment & Plan Note (Signed)
Declined C-pap Feb 2019

## 2018-01-21 ENCOUNTER — Encounter: Payer: Self-pay | Admitting: Family Medicine

## 2018-01-21 ENCOUNTER — Ambulatory Visit: Payer: Self-pay | Attending: Family Medicine | Admitting: Family Medicine

## 2018-01-21 ENCOUNTER — Ambulatory Visit: Payer: Self-pay | Admitting: Physical Therapy

## 2018-01-21 VITALS — BP 158/78 | HR 90

## 2018-01-21 VITALS — BP 157/77 | HR 69 | Temp 97.6°F | Ht 65.0 in | Wt 265.2 lb

## 2018-01-21 DIAGNOSIS — M25561 Pain in right knee: Principal | ICD-10-CM

## 2018-01-21 DIAGNOSIS — E1169 Type 2 diabetes mellitus with other specified complication: Secondary | ICD-10-CM

## 2018-01-21 DIAGNOSIS — G8929 Other chronic pain: Secondary | ICD-10-CM

## 2018-01-21 DIAGNOSIS — Z7984 Long term (current) use of oral hypoglycemic drugs: Secondary | ICD-10-CM | POA: Insufficient documentation

## 2018-01-21 DIAGNOSIS — Z7982 Long term (current) use of aspirin: Secondary | ICD-10-CM | POA: Insufficient documentation

## 2018-01-21 DIAGNOSIS — Z9889 Other specified postprocedural states: Secondary | ICD-10-CM | POA: Insufficient documentation

## 2018-01-21 DIAGNOSIS — Z79899 Other long term (current) drug therapy: Secondary | ICD-10-CM | POA: Insufficient documentation

## 2018-01-21 DIAGNOSIS — E1140 Type 2 diabetes mellitus with diabetic neuropathy, unspecified: Secondary | ICD-10-CM | POA: Insufficient documentation

## 2018-01-21 DIAGNOSIS — G629 Polyneuropathy, unspecified: Secondary | ICD-10-CM

## 2018-01-21 DIAGNOSIS — R399 Unspecified symptoms and signs involving the genitourinary system: Secondary | ICD-10-CM

## 2018-01-21 DIAGNOSIS — R262 Difficulty in walking, not elsewhere classified: Secondary | ICD-10-CM

## 2018-01-21 DIAGNOSIS — N898 Other specified noninflammatory disorders of vagina: Secondary | ICD-10-CM

## 2018-01-21 DIAGNOSIS — Z8673 Personal history of transient ischemic attack (TIA), and cerebral infarction without residual deficits: Secondary | ICD-10-CM | POA: Insufficient documentation

## 2018-01-21 DIAGNOSIS — Z79890 Hormone replacement therapy (postmenopausal): Secondary | ICD-10-CM | POA: Insufficient documentation

## 2018-01-21 DIAGNOSIS — I10 Essential (primary) hypertension: Secondary | ICD-10-CM | POA: Insufficient documentation

## 2018-01-21 DIAGNOSIS — M79661 Pain in right lower leg: Secondary | ICD-10-CM

## 2018-01-21 DIAGNOSIS — E038 Other specified hypothyroidism: Secondary | ICD-10-CM | POA: Insufficient documentation

## 2018-01-21 DIAGNOSIS — E119 Type 2 diabetes mellitus without complications: Secondary | ICD-10-CM

## 2018-01-21 DIAGNOSIS — M6281 Muscle weakness (generalized): Secondary | ICD-10-CM

## 2018-01-21 DIAGNOSIS — Z91018 Allergy to other foods: Secondary | ICD-10-CM | POA: Insufficient documentation

## 2018-01-21 DIAGNOSIS — Z91048 Other nonmedicinal substance allergy status: Secondary | ICD-10-CM | POA: Insufficient documentation

## 2018-01-21 LAB — POCT GLYCOSYLATED HEMOGLOBIN (HGB A1C): Hemoglobin A1C: 7.5 % — AB (ref 4.0–5.6)

## 2018-01-21 LAB — GLUCOSE, POCT (MANUAL RESULT ENTRY): POC GLUCOSE: 106 mg/dL — AB (ref 70–99)

## 2018-01-21 MED ORDER — GABAPENTIN 300 MG PO CAPS: 300.0000 mg | ORAL_CAPSULE | Freq: Every day | ORAL | 6 refills | Status: DC

## 2018-01-21 MED ORDER — METFORMIN HCL 500 MG PO TABS: 500.0000 mg | ORAL_TABLET | Freq: Two times a day (BID) | ORAL | 6 refills | Status: DC

## 2018-01-21 MED FILL — GABAPENTIN 300 MG CAPSULE: 300 | 30 days supply | Qty: 30 | Fill #0

## 2018-01-21 MED FILL — ?METFORMIN HCL 500MG TABLET: 500 | 30 days supply | Qty: 60 | Fill #0

## 2018-01-21 NOTE — Progress Notes (Signed)
Subjective:  [name], female    DOB: January 23, 1956  Age: 62 y.o. MRN: [id number]  CC: Hospitalization Follow-up and Diabetes   HPI Tiffany Strickland  is a 62 year old female with a history of hypertension, type 2 diabetes ( A1c of 7.5), hypothyroidism here for a follow-up visit. She has resisted commencing medications for diabetes and has been using turmeric.  She states this has also caused her knee swelling to resolve. She complains of paresthesia of the right middle 2 fingers which sometimes extends to her whole hand and she was seen for this at the ED on 11/27 at which time she was given a brace and placed on NSAIDs with minimal relief in symptoms; declined steroids due to concerns for elevation of blood sugar.  She had also complained of urinary symptoms and urine had tested positive for yeast for which she was placed on Diflucan however today she informs me her urine is malodorous but she denies vaginal discharge but sometimes has itching. She endorses taking her levothyroxine and her antihypertensive but her blood pressure is elevated. Currently being evaluated by cardiology for chest pain and has been scheduled for coronary CT.  Past Medical History:  Diagnosis Date  . Colon polyp    adenomatous  . Diabetes mellitus without complication ([place])   . Hyperlipidemia   . Hypertension   . Stroke ([place])   . Thyroid disease     Past Surgical History:  Procedure Laterality Date  . ABDOMINAL HYSTERECTOMY    . BLADDER SUSPENSION    . EUS N/A 08/12/2013   Procedure: LOWER ENDOSCOPIC ULTRASOUND (EUS);  Surgeon: Milus Banister, MD;  Location: [facility];  Service: Endoscopy;  Laterality: N/A;  . KNEE SURGERY    . TUBAL LIGATION      Allergies  Allergen Reactions  . Pollen Extract Other (See Comments)    Seasonal allergies  . Tomato Itching     Outpatient Medications Prior to Visit  Medication Sig Dispense Refill  . aspirin (ASPIR-81) 81 MG EC tablet Take 1 tablet  (81 mg total) by mouth daily. Swallow whole. 30 tablet 12  . Blood Glucose Monitoring Suppl (TRUE METRIX METER) DEVI 1 each by Does not apply route daily before breakfast. 1 Device 0  . glucose blood test strip Use 1 time daily before breakfast 100 each 12  . isosorbide mononitrate (IMDUR) 30 MG 24 hr tablet Take 1 tablet (30 mg total) by mouth daily. 30 tablet 2  . levothyroxine (SYNTHROID, LEVOTHROID) 150 MCG tablet TAKE 1 TABLET BY MOUTH DAILY BEFORE BREAKFAST. 30 tablet 3  . losartan-hydrochlorothiazide (HYZAAR) 100-25 MG tablet Take 1 tablet by mouth daily. 30 tablet 6  . metoprolol tartrate (LOPRESSOR) 50 MG tablet TAKE 1 TABLET 1 HOUR PRIOR TO TEST 1 tablet 0  . nitroGLYCERIN (NITROSTAT) 0.4 MG SL tablet Place 1 tablet (0.4 mg total) under the tongue every 5 (five) minutes as needed for chest pain. 20 tablet 0  . polyethylene glycol powder (GLYCOLAX/MIRALAX) powder TAKE 17 GRAMS WITH BY MOUTH DAILY. (Patient taking differently: Take 17 g by mouth daily as needed for mild constipation. ) 510 g 3  . TRUEPLUS LANCETS 28G MISC USE AS DIRECTED DAILY BEFORE BREAKFAST. 100 each 12   No facility-administered medications prior to visit.     ROS Review of Systems  Constitutional: Negative for activity change, appetite change and fatigue.  HENT: Negative for congestion, sinus pressure and sore throat.   Eyes: Negative for visual disturbance.  Respiratory: Negative  for cough, chest tightness, shortness of breath and wheezing.   Cardiovascular: Negative for chest pain and palpitations.  Gastrointestinal: Negative for abdominal distention, abdominal pain and constipation.  Endocrine: Negative for polydipsia.  Genitourinary: Negative for dysuria and frequency.  Musculoskeletal: Negative for arthralgias and back pain.  Skin: Negative for rash.  Neurological: Negative for tremors, light-headedness and numbness.  Hematological: Does not bruise/bleed easily.  Psychiatric/Behavioral: Negative for  agitation and behavioral problems.    Objective:  BP (!) 157/77   Pulse 69   Temp 97.6 F (36.4 C) (Oral)   Ht 5\' 5"  (1.651 m)   Wt 265 lb 3.2 oz (120.3 kg)   SpO2 96%   BMI 44.13 kg/m   BP/Weight 01/21/2018 01/21/2018 52/77/8242  Systolic BP 353 614 431  Diastolic BP 77 78 84  Wt. (Lbs) 265.2 - 265  BMI 44.13 - 44.1      Physical Exam  Constitutional: She is oriented to person, place, and time. She appears well-developed and well-nourished.  Cardiovascular: Normal rate, normal heart sounds and intact distal pulses.  No murmur heard. Pulmonary/Chest: Effort normal and breath sounds normal. She has no wheezes. She has no rales. She exhibits no tenderness.  Abdominal: Soft. Bowel sounds are normal. She exhibits no distension and no mass. There is no tenderness.  Musculoskeletal: Normal range of motion.  Negative Phalen and Tinel sign  Neurological: She is alert and oriented to person, place, and time.  Skin: Skin is warm and dry.    CMP Latest Ref Rng & Units 11/06/2017 02/02/2017 02/02/2017  Glucose 65 - 99 mg/dL 115(H) - 125(H)  BUN 8 - 27 mg/dL 12 - 12  Creatinine 0.57 - 1.00 mg/dL 0.76 0.65 0.82  Sodium 134 - 144 mmol/L 144 - 138  Potassium 3.5 - 5.2 mmol/L 4.2 - 3.7  Chloride 96 - 106 mmol/L 99 - 102  CO2 20 - 29 mmol/L 27 - 29  Calcium 8.7 - 10.3 mg/dL 9.8 - 9.2  Total Protein 6.0 - 8.5 g/dL 6.9 - -  Total Bilirubin 0.0 - 1.2 mg/dL 0.3 - -  Alkaline Phos 39 - 117 IU/L 90 - -  AST 0 - 40 IU/L 13 - -  ALT 0 - 32 IU/L 16 - -    Lipid Panel     Component Value Date/Time   CHOL 163 11/06/2017 1028   TRIG 74 11/06/2017 1028   HDL 54 11/06/2017 1028   CHOLHDL 3.0 11/06/2017 1028   CHOLHDL 3.1 11/08/2015 1210   VLDL 13 11/08/2015 1210   LDLCALC 94 11/06/2017 1028    Lab Results  Component Value Date   HGBA1C 7.5 (A) 01/21/2018    Assessment & Plan:   1. Non-insulin treated type 2 diabetes mellitus ([place]) Not at goal with A1c of 7.5 which is above goal  of less than 7.0 Commence metformin-she had resisted initiation of medications previously previously Counseled on Diabetic diet, my plate method, 540 minutes of moderate intensity exercise/week Keep blood sugar logs with fasting goals of 80-120 mg/dl, random of less than 180 and in the event of sugars less than 60 mg/dl or greater than 400 mg/dl please notify the clinic ASAP. It is recommended that you undergo annual eye exams and annual foot exams. Pneumonia vaccine is recommended. - POCT glucose (manual entry) - POCT glycosylated hemoglobin (Hb A1C) - metFORMIN (GLUCOPHAGE) 500 MG tablet; Take 1 tablet (500 mg total) by mouth 2 (two) times daily with a meal.  Dispense: 60 tablet; Refill: 6  2. Other specified hypothyroidism Uncontrolled previously We will check thyroid panel and adjust levothyroxine dose accordingly - T4, free - TSH  3. Neuropathy Commence gabapentin -Adverse effects of gabapentin discussed - gabapentin (NEURONTIN) 300 MG capsule; Take 1 capsule (300 mg total) by mouth at bedtime.  Dispense: 60 capsule; Refill: 6  4. Urinary symptom or sign Recently treated for vaginal candidiasis at the ED We will send of urine due to persisting symptoms - Urine cytology ancillary only  5. Essential hypertension Uncontrolled Compliance with medications emphasized Counseled on blood pressure goal of less than 130/80, low-sodium, DASH diet, medication compliance, 150 minutes of moderate intensity exercise per week. Discussed medication compliance, adverse effects.    Meds ordered this encounter  Medications  . metFORMIN (GLUCOPHAGE) 500 MG tablet    Sig: Take 1 tablet (500 mg total) by mouth 2 (two) times daily with a meal.    Dispense:  60 tablet    Refill:  6  . gabapentin (NEURONTIN) 300 MG capsule    Sig: Take 1 capsule (300 mg total) by mouth at bedtime.    Dispense:  60 capsule    Refill:  6    Follow-up: Return in about 3 months (around 04/22/2018) for follow up  of chronic medical conditions.   Charlott Rakes MD

## 2018-01-21 NOTE — Progress Notes (Signed)
Patient states that she has a yeast infection.

## 2018-01-21 NOTE — Therapy (Signed)
[place], [place], [address] Phone: [phone]   Fax:  [phone]  Physical Therapy Treatment  Patient Details  Name: Tiffany Strickland MRN: [id number] Date of Birth: 06-24-1955 Referring Provider (PT): Eunice Blase, MD   Encounter Date: 01/21/2018  PT End of Session - 01/21/18 0852    Visit Number  6    Number of Visits  13    Date for PT Re-Evaluation  02/23/18    PT Start Time  0848    PT Stop Time  0915    PT Time Calculation (min)  27 min       Past Medical History:  Diagnosis Date  . Colon polyp    adenomatous  . Diabetes mellitus without complication ([place])   . Hyperlipidemia   . Hypertension   . Stroke ([place])   . Thyroid disease     Past Surgical History:  Procedure Laterality Date  . ABDOMINAL HYSTERECTOMY    . BLADDER SUSPENSION    . EUS N/A 08/12/2013   Procedure: LOWER ENDOSCOPIC ULTRASOUND (EUS);  Surgeon: Milus Banister, MD;  Location: [facility];  Service: Endoscopy;  Laterality: N/A;  . KNEE SURGERY    . TUBAL LIGATION      Vitals:   01/21/18 0916  BP: (!) 158/78  Pulse: 90  SpO2: 91%    Subjective Assessment - 01/21/18 0853    Subjective  I've been having warmth in my chest. Started Friday. Went to the doctor. He had ordered a CT angiogram and a stress test.                        [facility] Adult PT Treatment/Exercise - 01/21/18 0001      Knee/Hip Exercises: Aerobic   Nustep  L3 x 10 mintutes       Knee/Hip Exercises: Standing   Heel Raises  10 reps;2 sets    Hip Flexion  1 set;10 reps;Right;Left    Hip Abduction  10 reps;1 set;Both    Hip Extension  10 reps;1 set;Both                  PT Long Term Goals - 12/24/17 1010      PT LONG TERM GOAL #1   Title  Pt will be I and compliant with HEP and progression.    Baseline   HEP for LE started on 12/24/17    Time  6    Period  Weeks    Status  New    Target Date  02/06/18      PT  LONG TERM GOAL #2   Title  Pt will be able to perform sit to stand in less than 10 seconds using UE support as needed.     Baseline  sit to stand in 40 seconds using multiple attemps to stand.     Time  6    Period  Weeks    Status  New    Target Date  02/06/18      PT LONG TERM GOAL #3   Title  Pt will improve her FOTO score from 53% limitaiton to </= 43% limiation.     Time  6    Period  Weeks    Status  New    Target Date  02/06/18      PT LONG TERM GOAL #4   Title  Pt will be able to amb on community surfaces >/= 1000 feet with  pain </= 3/10 in her R LE.     Baseline  pain with amb 7-8/10.     Time  6    Period  Weeks    Status  New    Target Date  02/06/18      PT LONG TERM GOAL #5   Title  Pt will be able to step up and down off a curb safely.     Baseline  unable without assistance    Time  6    Period  Weeks    Status  New    Target Date  02/06/18            Plan - 01/21/18 0109    Clinical Impression Statement  Pt arrives reporting fatigue. She will have CTA and stress test soon. She reports intermittent warmth in her chest. Began with gentle Nustep. SP02 90% after Nustep. Used pursed lip breathing to raise SPO2. Able to maintain SP02 at 95 and above during standing exercises. BP taken at 158/78. Due to feeling of fatigue abdormal vitals we opted to end session early. She has primary MD appointment today.     PT Next Visit Plan  Pt may cancel until after CTA/ stress test/ monitor vitals/ what did primary MD say ? Nustep assess treatment, knee ROM and strengthening, hamstring stretching, hip flexor stretching, review new stretches, massage roller/STW to right thigh, lateral, anterior, and adductors    PT Home Exercise Plan  sit to stand, self massage using tennis ball on IT band and quad, hamstring stretch, supine butterfly, supine hip flexor dangle    Consulted and Agree with Plan of Care  Patient       Patient will benefit from skilled therapeutic intervention  in order to improve the following deficits and impairments:     Visit Diagnosis: Chronic pain of right knee  Pain in right lower leg  Difficulty in walking, not elsewhere classified  Muscle weakness (generalized)     Problem List Patient Active Problem List   Diagnosis Date Noted  . OSA (obstructive sleep apnea) 02/03/2017  . Hypothyroidism 02/02/2017  . Chest pain 02/02/2017  . Morbid obesity ([place]) 01/27/2017  . Plantar fasciitis of left foot 11/08/2015  . Non-insulin treated type 2 diabetes mellitus ([place]) 06/08/2015  . Colon cancer screening 06/08/2015  . Bilateral leg edema 12/15/2014  . Blurry vision, bilateral 06/16/2014  . Breast cancer screening 06/16/2014  . Other specified hypothyroidism 06/16/2014  . Other seasonal allergic rhinitis 06/16/2014  . History of stroke 01/31/2014  . Essential hypertension 08/24/2013  . Nonspecific (abnormal) findings on radiological and other examination of gastrointestinal tract 08/12/2013  . Lateral epicondylitis of right elbow 07/21/2013  . Right shoulder pain 07/21/2013  . Sprain of wrist, right 07/21/2013  . Unspecified constipation 04/15/2013  . Rectal bleeding 04/15/2013    Dorene Ar, [place] 01/21/2018, 9:29 AM  [place], [place], [address] Phone: [phone]   Fax:  [phone]  Name: Danilynn Jemison MRN: [id number] Date of Birth: August 15, 1955

## 2018-01-22 ENCOUNTER — Other Ambulatory Visit: Payer: Self-pay | Admitting: Family Medicine

## 2018-01-22 DIAGNOSIS — E038 Other specified hypothyroidism: Secondary | ICD-10-CM

## 2018-01-22 LAB — T4, FREE: Free T4: 1.25 ng/dL (ref 0.82–1.77)

## 2018-01-22 LAB — URINE CYTOLOGY ANCILLARY ONLY
Chlamydia: NEGATIVE
Neisseria Gonorrhea: NEGATIVE
Trichomonas: NEGATIVE

## 2018-01-22 LAB — TSH: TSH: 9.19 u[IU]/mL — ABNORMAL HIGH (ref 0.450–4.500)

## 2018-01-22 MED ORDER — LEVOTHYROXINE SODIUM 175 MCG PO TABS: ORAL_TABLET | ORAL | 3 refills | Status: DC

## 2018-01-23 LAB — URINE CYTOLOGY ANCILLARY ONLY

## 2018-01-26 ENCOUNTER — Encounter: Payer: Self-pay | Admitting: Physical Therapy

## 2018-01-26 ENCOUNTER — Ambulatory Visit: Payer: Self-pay | Admitting: Physical Therapy

## 2018-01-26 DIAGNOSIS — M6281 Muscle weakness (generalized): Secondary | ICD-10-CM

## 2018-01-26 DIAGNOSIS — R262 Difficulty in walking, not elsewhere classified: Secondary | ICD-10-CM

## 2018-01-26 DIAGNOSIS — M25561 Pain in right knee: Principal | ICD-10-CM

## 2018-01-26 DIAGNOSIS — G8929 Other chronic pain: Secondary | ICD-10-CM

## 2018-01-26 DIAGNOSIS — M79661 Pain in right lower leg: Secondary | ICD-10-CM

## 2018-01-26 NOTE — Therapy (Signed)
[place], [place], [address] Phone: [phone]   Fax:  [phone]  Physical Therapy Treatment  Patient Details  Name: Tiffany Strickland MRN: [id number] Date of Birth: 04/05/1955 Referring Provider (PT): Eunice Blase, MD   Encounter Date: 01/26/2018  PT End of Session - 01/26/18 0859    Visit Number  7    Number of Visits  13    Date for PT Re-Evaluation  02/23/18    PT Start Time  0851    PT Stop Time  0930    PT Time Calculation (min)  39 min       Past Medical History:  Diagnosis Date  . Colon polyp    adenomatous  . Diabetes mellitus without complication ([place])   . Hyperlipidemia   . Hypertension   . Stroke ([place])   . Thyroid disease     Past Surgical History:  Procedure Laterality Date  . ABDOMINAL HYSTERECTOMY    . BLADDER SUSPENSION    . EUS N/A 08/12/2013   Procedure: LOWER ENDOSCOPIC ULTRASOUND (EUS);  Surgeon: Milus Banister, MD;  Location: [facility];  Service: Endoscopy;  Laterality: N/A;  . KNEE SURGERY    . TUBAL LIGATION      There were no vitals filed for this visit.  Subjective Assessment - 01/26/18 0856    Subjective  Still need to schedule the CTA and stress test. I have to call today. I feel better. I am not having the warmth in my chest anymore.     Pain Location  Hip    Pain Orientation  Right;Left    Pain Radiating Towards  bilateral groin    Aggravating Factors   in and out of car, walking    Pain Relieving Factors  heat, aleve     Pain Score  8    Pain Location  Back    Pain Orientation  Lower    Pain Descriptors / Indicators  Aching    Pain Type  Chronic pain    Aggravating Factors   slept wrong    Pain Relieving Factors  nothing yet                        [facility] Adult PT Treatment/Exercise - 01/26/18 0001      Knee/Hip Exercises: Stretches   Hip Flexor Stretch  Right;1 rep;60 seconds    Hip Flexor Stretch Limitations  edge of mat     Gastroc Stretch  3 reps;20 seconds    Gastroc Stretch Limitations  runners stretch at counter    Other Knee/Hip Stretches  butterfly stretch, clam AROM      Knee/Hip Exercises: Aerobic   Nustep  L4 x 10 mintutes       Knee/Hip Exercises: Standing   Heel Raises  10 reps;2 sets    Hip Flexion  1 set;10 reps;Right;Left    Hip Flexion Limitations  used 4 inch step to tap    Hip Abduction  20 reps;Right;Left    Hip Extension  20 reps;Right;Left    Other Standing Knee Exercises  STS x 5 in 17 sec , 2 rounds     Other Standing Knee Exercises  4 inch step up x 10 each at counter                  PT Long Term Goals - 12/24/17 1010      PT LONG TERM GOAL #1   Title  Pt will be I and compliant with HEP and progression.    Baseline   HEP for LE started on 12/24/17    Time  6    Period  Weeks    Status  New    Target Date  02/06/18      PT LONG TERM GOAL #2   Title  Pt will be able to perform sit to stand in less than 10 seconds using UE support as needed.     Baseline  sit to stand in 40 seconds using multiple attemps to stand.     Time  6    Period  Weeks    Status  New    Target Date  02/06/18      PT LONG TERM GOAL #3   Title  Pt will improve her FOTO score from 53% limitaiton to </= 43% limiation.     Time  6    Period  Weeks    Status  New    Target Date  02/06/18      PT LONG TERM GOAL #4   Title  Pt will be able to amb on community surfaces >/= 1000 feet with pain </= 3/10 in her R LE.     Baseline  pain with amb 7-8/10.     Time  6    Period  Weeks    Status  New    Target Date  02/06/18      PT LONG TERM GOAL #5   Title  Pt will be able to step up and down off a curb safely.     Baseline  unable without assistance    Time  6    Period  Weeks    Status  New    Target Date  02/06/18            Plan - 01/26/18 0949    Clinical Impression Statement  Pt reports still waiting to schedule stress test. She is no longer having chest warmth symptoms.  HR and SP02 normal today. MD said A1C was elevated. More difficulty noted with right hip flexion exercises today and she had difficulty lifting RLE on and off mat table. Reviewed hip flexor and adductor stretch, Encouraged massage and heat at home.     PT Next Visit Plan  Nustep assess treatment, knee ROM and strengthening, hamstring stretching, hip flexor stretching, review new stretches, massage roller/STW to right thigh, lateral, anterior, and adductors    PT Home Exercise Plan  sit to stand, self massage using tennis ball on IT band and quad, hamstring stretch, supine butterfly, supine hip flexor dangle    Consulted and Agree with Plan of Care  Patient       Patient will benefit from skilled therapeutic intervention in order to improve the following deficits and impairments:  Decreased activity tolerance, Decreased endurance, Decreased range of motion, Decreased strength, Impaired perceived functional ability, Impaired flexibility, Increased fascial restricitons, Increased muscle spasms, Pain, Postural dysfunction, Obesity  Visit Diagnosis: Chronic pain of right knee  Pain in right lower leg  Difficulty in walking, not elsewhere classified  Muscle weakness (generalized)     Problem List Patient Active Problem List   Diagnosis Date Noted  . OSA (obstructive sleep apnea) 02/03/2017  . Hypothyroidism 02/02/2017  . Chest pain 02/02/2017  . Morbid obesity ([place]) 01/27/2017  . Plantar fasciitis of left foot 11/08/2015  . Non-insulin treated type 2 diabetes mellitus ([place]) 06/08/2015  . Colon cancer screening 06/08/2015  .  Bilateral leg edema 12/15/2014  . Blurry vision, bilateral 06/16/2014  . Breast cancer screening 06/16/2014  . Other specified hypothyroidism 06/16/2014  . Other seasonal allergic rhinitis 06/16/2014  . History of stroke 01/31/2014  . Essential hypertension 08/24/2013  . Nonspecific (abnormal) findings on radiological and other examination of gastrointestinal tract  08/12/2013  . Lateral epicondylitis of right elbow 07/21/2013  . Right shoulder pain 07/21/2013  . Sprain of wrist, right 07/21/2013  . Unspecified constipation 04/15/2013  . Rectal bleeding 04/15/2013    Dorene Ar, [place] 01/26/2018, 10:21 AM  [facility] [address] [place], [place], [address] Phone: [phone]   Fax:  [phone]  Name: Tiffany Strickland MRN: [id number] Date of Birth: 1955-11-28

## 2018-01-28 ENCOUNTER — Encounter: Payer: Self-pay | Admitting: Physical Therapy

## 2018-01-28 ENCOUNTER — Ambulatory Visit: Payer: Self-pay | Admitting: Physical Therapy

## 2018-01-28 DIAGNOSIS — M79661 Pain in right lower leg: Secondary | ICD-10-CM

## 2018-01-28 DIAGNOSIS — G8929 Other chronic pain: Secondary | ICD-10-CM

## 2018-01-28 DIAGNOSIS — R262 Difficulty in walking, not elsewhere classified: Secondary | ICD-10-CM

## 2018-01-28 DIAGNOSIS — M6281 Muscle weakness (generalized): Secondary | ICD-10-CM

## 2018-01-28 DIAGNOSIS — M25561 Pain in right knee: Principal | ICD-10-CM

## 2018-01-28 NOTE — Therapy (Addendum)
[place], [place], [address] Phone: [phone]   Fax:  [phone]  Physical Therapy Treatment Discharge  Patient Details  Name: Tiffany Strickland MRN: [id number] Date of Birth: 19-Mar-1955 Referring Provider (PT): Eunice Blase, MD   Encounter Date: 01/28/2018  PT End of Session - 01/28/18 1257    Visit Number  8    Number of Visits  13    Date for PT Re-Evaluation  02/23/18    PT Start Time  6950    PT Stop Time  1345    PT Time Calculation (min)  50 min       Past Medical History:  Diagnosis Date  . Colon polyp    adenomatous  . Diabetes mellitus without complication ([place])   . Hyperlipidemia   . Hypertension   . Stroke ([place])   . Thyroid disease     Past Surgical History:  Procedure Laterality Date  . ABDOMINAL HYSTERECTOMY    . BLADDER SUSPENSION    . EUS N/A 08/12/2013   Procedure: LOWER ENDOSCOPIC ULTRASOUND (EUS);  Surgeon: Milus Banister, MD;  Location: [facility];  Service: Endoscopy;  Laterality: N/A;  . KNEE SURGERY    . TUBAL LIGATION      There were no vitals filed for this visit.  Subjective Assessment - 01/28/18 1256    Subjective  The knee and groin are hurting. The groin is a little better.     Currently in Pain?  Yes    Pain Location  Knee    Pain Orientation  Right;Medial    Pain Descriptors / Indicators  Aching    Pain Score  0    Pain Location  Back                       [facility] Adult PT Treatment/Exercise - 01/28/18 0001      Knee/Hip Exercises: Stretches   Other Knee/Hip Stretches  slant board stretch    Other Knee/Hip Stretches  butterfly stretch, clam AROM      Knee/Hip Exercises: Aerobic   Nustep  L4 x 10 mintutes       Knee/Hip Exercises: Standing   Heel Raises  10 reps;2 sets    Hip Flexion  10 reps;Right;Left;2 sets    Hip Abduction  20 reps;Right;Left    Hip Extension  20 reps;Right;Left    Other Standing Knee Exercises  STS x 10        Knee/Hip Exercises: Supine   Heel Slides  10 reps    Bridges  10 reps      Modalities   Modalities  Moist Heat      Moist Heat Therapy   Number Minutes Moist Heat  10 Minutes    Moist Heat Location  Hip   right thigh,adductors     Manual Therapy   Soft tissue mobilization  Right quad and adductors with and without massage roller                  PT Long Term Goals - 12/24/17 1010      PT LONG TERM GOAL #1   Title  Pt will be I and compliant with HEP and progression.    Baseline   HEP for LE started on 12/24/17    Time  6    Period  Weeks    Status  New    Target Date  02/06/18      PT LONG TERM  GOAL #2   Title  Pt will be able to perform sit to stand in less than 10 seconds using UE support as needed.     Baseline  sit to stand in 40 seconds using multiple attemps to stand.     Time  6    Period  Weeks    Status  New    Target Date  02/06/18      PT LONG TERM GOAL #3   Title  Pt will improve her FOTO score from 53% limitaiton to </= 43% limiation.     Time  6    Period  Weeks    Status  New    Target Date  02/06/18      PT LONG TERM GOAL #4   Title  Pt will be able to amb on community surfaces >/= 1000 feet with pain </= 3/10 in her R LE.     Baseline  pain with amb 7-8/10.     Time  6    Period  Weeks    Status  New    Target Date  02/06/18      PT LONG TERM GOAL #5   Title  Pt will be able to step up and down off a curb safely.     Baseline  unable without assistance    Time  6    Period  Weeks    Status  New    Target Date  02/06/18            Plan - 01/28/18 1350    Clinical Impression Statement  Pt arrives reporting less groin pain however increased right knee pain. Continued with open and closed chain hip strengthening and stretching. Manual added today for right hip adductors and right quad. Significant tightness felt on paplpation. Tissued softened and less tender afterward. Added HMP for soreness. After ward she reports the  groin feeling better.     PT Next Visit Plan  assess benefit of STW and continue if helpful; Nustep assess treatment, knee ROM and strengthening, hamstring stretching, hip flexor stretching, review new stretches, massage roller/STW to right thigh, lateral, anterior, and adductors    PT Home Exercise Plan  sit to stand, self massage using tennis ball on IT band and quad, hamstring stretch, supine butterfly, supine hip flexor dangle    Consulted and Agree with Plan of Care  Patient       Patient will benefit from skilled therapeutic intervention in order to improve the following deficits and impairments:  Decreased activity tolerance, Decreased endurance, Decreased range of motion, Decreased strength, Impaired perceived functional ability, Impaired flexibility, Increased fascial restricitons, Increased muscle spasms, Pain, Postural dysfunction, Obesity  Visit Diagnosis: Chronic pain of right knee  Pain in right lower leg  Difficulty in walking, not elsewhere classified  Muscle weakness (generalized)     Problem List Patient Active Problem List   Diagnosis Date Noted  . OSA (obstructive sleep apnea) 02/03/2017  . Hypothyroidism 02/02/2017  . Chest pain 02/02/2017  . Morbid obesity ([place]) 01/27/2017  . Plantar fasciitis of left foot 11/08/2015  . Non-insulin treated type 2 diabetes mellitus ([place]) 06/08/2015  . Colon cancer screening 06/08/2015  . Bilateral leg edema 12/15/2014  . Blurry vision, bilateral 06/16/2014  . Breast cancer screening 06/16/2014  . Other specified hypothyroidism 06/16/2014  . Other seasonal allergic rhinitis 06/16/2014  . History of stroke 01/31/2014  . Essential hypertension 08/24/2013  . Nonspecific (abnormal) findings on radiological and other examination of  gastrointestinal tract 08/12/2013  . Lateral epicondylitis of right elbow 07/21/2013  . Right shoulder pain 07/21/2013  . Sprain of wrist, right 07/21/2013  . Unspecified constipation 04/15/2013  .  Rectal bleeding 04/15/2013   PHYSICAL THERAPY DISCHARGE SUMMARY  Visits from Start of Care: 8  Current functional level related to goals / functional outcomes: See above   Remaining deficits: See above   Education / Equipment: HEP Plan:                                                    Patient goals were not met. Patient is being discharged due to not returning since the last visit.  ?????       Kearney Hard, PT 03/04/18 8:45 AM    Hyacinth Meeker Dereck Leep, PTA 01/28/2018, 1:56 PM  [facility] [address] [place], [place], [address] Phone: [phone]   Fax:  [phone]  Name: Tiffany Strickland MRN: [id number] Date of Birth: [birth year]

## 2018-02-02 ENCOUNTER — Ambulatory Visit: Payer: Self-pay | Admitting: Physical Therapy

## 2018-02-05 ENCOUNTER — Inpatient Hospital Stay (HOSPITAL_COMMUNITY)
Admission: AD | Admit: 2018-02-05 | Discharge: 2018-02-05 | Disposition: A | Discharge status: Home / Self Care | Payer: No Typology Code available for payment source | Attending: Obstetrics & Gynecology | Admitting: Obstetrics & Gynecology

## 2018-02-05 DIAGNOSIS — Z711 Person with feared health complaint in whom no diagnosis is made: Secondary | ICD-10-CM | POA: Insufficient documentation

## 2018-02-05 DIAGNOSIS — N898 Other specified noninflammatory disorders of vagina: Secondary | ICD-10-CM

## 2018-02-05 LAB — URINALYSIS, ROUTINE W REFLEX MICROSCOPIC
Bilirubin Urine: NEGATIVE
GLUCOSE, UA: NEGATIVE mg/dL
HGB URINE DIPSTICK: NEGATIVE
KETONES UR: NEGATIVE mg/dL
LEUKOCYTES UA: NEGATIVE
Nitrite: NEGATIVE
Protein, ur: NEGATIVE mg/dL
Specific Gravity, Urine: 1.005 (ref 1.005–1.030)
pH: 6 (ref 5.0–8.0)

## 2018-02-05 LAB — WET PREP, GENITAL
Clue Cells Wet Prep HPF POC: NONE SEEN
SPERM: NONE SEEN
Trich, Wet Prep: NONE SEEN
Yeast Wet Prep HPF POC: NONE SEEN

## 2018-02-05 NOTE — MAU Provider Note (Signed)
History     CSN: [id number]  Arrival date and time: 02/05/18 1146   None     Chief Complaint  Patient presents with  . vaginal odor   HPI Tiffany Strickland is a 62 y.o. [id number] who presents to MAU with chief complaint of vaginal odor. This is a new problem, onset about one week ago. She states the odor is so strong she worries that people sitting next to her will comment. She denies itching, foul discharge, recent sexual contact, abnormal vaginal bleeding, abdominal tenderness, fever and recent illness.  Pertinent Gynecological History: Menses: N/A Bleeding: No concerns per patient Contraception: abstinence DES exposure: denies Blood transfusions: none Sexually transmitted diseases: currently at risk Last mammogram: normal Date: 07/11/2016 Last pap: normal Date: "within past few years"   Past Medical History:  Diagnosis Date  . Colon polyp    adenomatous  . Diabetes mellitus without complication ([place])   . Hyperlipidemia   . Hypertension   . Stroke ([place])   . Thyroid disease     Past Surgical History:  Procedure Laterality Date  . ABDOMINAL HYSTERECTOMY    . BLADDER SUSPENSION    . EUS N/A 08/12/2013   Procedure: LOWER ENDOSCOPIC ULTRASOUND (EUS);  Surgeon: Milus Banister, MD;  Location: [facility];  Service: Endoscopy;  Laterality: N/A;  . KNEE SURGERY    . TUBAL LIGATION      Family History  Problem Relation Age of Onset  . Hypertension Mother   . Heart disease Maternal Grandmother   . Cancer Maternal Grandmother        ovarian  . Cancer Maternal Grandfather   . Hypertension Other   . Hyperlipidemia Other   . Cancer Other   . Sleep apnea Other   . Obesity Other     Social History   Tobacco Use  . Smoking status: Never Smoker  . Smokeless tobacco: Never Used  Substance Use Topics  . Alcohol use: No  . Drug use: No    Allergies:  Allergies  Allergen Reactions  . Pollen Extract Other (See Comments)    Seasonal allergies  . Tomato Itching     Medications Prior to Admission  Medication Sig Dispense Refill Last Dose  . aspirin (ASPIR-81) 81 MG EC tablet Take 1 tablet (81 mg total) by mouth daily. Swallow whole. 30 tablet 12 Taking  . Blood Glucose Monitoring Suppl (TRUE METRIX METER) DEVI 1 each by Does not apply route daily before breakfast. 1 Device 0 Taking  . gabapentin (NEURONTIN) 300 MG capsule Take 1 capsule (300 mg total) by mouth at bedtime. 60 capsule 6   . glucose blood test strip Use 1 time daily before breakfast 100 each 12 Taking  . isosorbide mononitrate (IMDUR) 30 MG 24 hr tablet Take 1 tablet (30 mg total) by mouth daily. 30 tablet 2 Taking  . levothyroxine (SYNTHROID, LEVOTHROID) 175 MCG tablet TAKE 1 TABLET BY MOUTH DAILY BEFORE BREAKFAST. 30 tablet 3   . losartan-hydrochlorothiazide (HYZAAR) 100-25 MG tablet Take 1 tablet by mouth daily. 30 tablet 6 Taking  . metFORMIN (GLUCOPHAGE) 500 MG tablet Take 1 tablet (500 mg total) by mouth 2 (two) times daily with a meal. 60 tablet 6   . metoprolol tartrate (LOPRESSOR) 50 MG tablet TAKE 1 TABLET 1 HOUR PRIOR TO TEST 1 tablet 0 Taking  . nitroGLYCERIN (NITROSTAT) 0.4 MG SL tablet Place 1 tablet (0.4 mg total) under the tongue every 5 (five) minutes as needed for chest pain. 20 tablet 0 Taking  .  polyethylene glycol powder (GLYCOLAX/MIRALAX) powder TAKE 17 GRAMS WITH BY MOUTH DAILY. (Patient taking differently: Take 17 g by mouth daily as needed for mild constipation. ) 510 g 3 Taking  . TRUEPLUS LANCETS 28G MISC USE AS DIRECTED DAILY BEFORE BREAKFAST. 100 each 12 Taking    Review of Systems  Constitutional: Negative for chills, fatigue and fever.  Eyes: Negative for pain.  Gastrointestinal: Negative for abdominal pain.  Genitourinary: Negative for difficulty urinating, vaginal bleeding, vaginal discharge and vaginal pain.  Musculoskeletal: Negative for back pain.  Neurological: Negative for headaches.  All other systems reviewed and are negative.  Physical Exam    Blood pressure (!) 158/78, pulse 89, temperature 98 F (36.7 C), resp. rate 16, SpO2 98 %.  Physical Exam  Nursing note and vitals reviewed. Constitutional: She appears well-developed and well-nourished.  Respiratory: Effort normal. She exhibits no tenderness.  GI: Soft. She exhibits no distension. There is no abdominal tenderness. There is no rebound and no guarding.  Genitourinary:    No vaginal discharge.   Musculoskeletal: Normal range of motion.  Neurological: She is alert.  Skin: Skin is warm and dry.  Psychiatric: She has a normal mood and affect. Her behavior is normal. Judgment and thought content normal.    MAU Course/MDM   --Recent ED and Urgent Care visit notes reviewed --No concerning findings on exam or labs collected today --Patient declined rx for Diflucan  Patient Vitals for the past 24 hrs:  BP Temp Pulse Resp SpO2  02/05/18 1308 (!) 158/78 98 F (36.7 C) 89 16 98 %     Assessment and Plan  --62 y.o. [id number]  --No concerning findings --Discharge home in stable condition  F/U: with PCP as indicated  Darlina Rumpf , CNM 02/05/2018, 2:27 PM

## 2018-02-05 NOTE — MAU Note (Signed)
Pt presents to MAU with complaints of vaginal odor that started a week ago.

## 2018-02-05 NOTE — Discharge Instructions (Signed)
Vaginal Yeast infection, Adult    Vaginal yeast infection is a condition that causes vaginal discharge as well as soreness, swelling, and redness (inflammation) of the vagina. This is a common condition. Some women get this infection frequently.  What are the causes?  This condition is caused by a change in the normal balance of the yeast (candida) and bacteria that live in the vagina. This change causes an overgrowth of yeast, which causes the inflammation.  What increases the risk?  The condition is more likely to develop in women who:   Take antibiotic medicines.   Have diabetes.   Take birth control pills.   Are pregnant.   Douche often.   Have a weak body defense system (immune system).   Have been taking steroid medicines for a long time.   Frequently wear tight clothing.  What are the signs or symptoms?  Symptoms of this condition include:   White, thick, creamy vaginal discharge.   Swelling, itching, redness, and irritation of the vagina. The lips of the vagina (vulva) may be affected as well.   Pain or a burning feeling while urinating.   Pain during sex.  How is this diagnosed?  This condition is diagnosed based on:   Your medical history.   A physical exam.   A pelvic exam. Your health care provider will examine a sample of your vaginal discharge under a microscope. Your health care provider may send this sample for testing to confirm the diagnosis.  How is this treated?  This condition is treated with medicine. Medicines may be over-the-counter or prescription. You may be told to use one or more of the following:   Medicine that is taken by mouth (orally).   Medicine that is applied as a cream (topically).   Medicine that is inserted directly into the vagina (suppository).  Follow these instructions at home:    Lifestyle   Do not have sex until your health care provider approves. Tell your sex partner that you have a yeast infection. That person should go to his or her health care  provider and ask if they should also be treated.   Do not wear tight clothes, such as pantyhose or tight pants.   Wear breathable cotton underwear.  General instructions   Take or apply over-the-counter and prescription medicines only as told by your health care provider.   Eat more yogurt. This may help to keep your yeast infection from returning.   Do not use tampons until your health care provider approves.   Try taking a sitz bath to help with discomfort. This is a warm water bath that is taken while you are sitting down. The water should only come up to your hips and should cover your buttocks. Do this 3-4 times per day or as told by your health care provider.   Do not douche.   If you have diabetes, keep your blood sugar levels under control.   Keep all follow-up visits as told by your health care provider. This is important.  Contact a health care provider if:   You have a fever.   Your symptoms go away and then return.   Your symptoms do not get better with treatment.   Your symptoms get worse.   You have new symptoms.   You develop blisters in or around your vagina.   You have blood coming from your vagina and it is not your menstrual period.   You develop pain in your abdomen.  Summary     Vaginal yeast infection is a condition that causes discharge as well as soreness, swelling, and redness (inflammation) of the vagina.   This condition is treated with medicine. Medicines may be over-the-counter or prescription.   Take or apply over-the-counter and prescription medicines only as told by your health care provider.   Do not douche. Do not have sex or use tampons until your health care provider approves.   Contact a health care provider if your symptoms do not get better with treatment or your symptoms go away and then return.  This information is not intended to replace advice given to you by your health care provider. Make sure you discuss any questions you have with your health care  provider.  Document Released: 11/07/2004 Document Revised: 06/16/2017 Document Reviewed: 06/16/2017  Elsevier Interactive Patient Education  2019 [employer]

## 2018-02-06 ENCOUNTER — Ambulatory Visit: Payer: Self-pay | Admitting: Physical Therapy

## 2018-02-06 LAB — GC/CHLAMYDIA PROBE AMP (~~LOC~~) NOT AT ARMC
CHLAMYDIA, DNA PROBE: NEGATIVE
NEISSERIA GONORRHEA: NEGATIVE

## 2018-02-10 ENCOUNTER — Ambulatory Visit: Payer: Self-pay | Admitting: Physical Therapy

## 2018-02-12 ENCOUNTER — Ambulatory Visit: Payer: Self-pay | Admitting: Physical Therapy

## 2018-02-13 MED FILL — ?LEVOTHYROXINE 150 MCG TAB: 150 | 30 days supply | Qty: 30 | Fill #2

## 2018-02-13 MED FILL — LOSARTAN-HCTZ 100-25 MG TAB: 100-25 | 30 days supply | Qty: 30 | Fill #3

## 2018-02-13 MED FILL — ISOSORBIDE MN ER 30 MG TAB: 30 | 30 days supply | Qty: 30 | Fill #1

## 2018-02-17 ENCOUNTER — Encounter: Payer: Self-pay | Admitting: Physical Therapy

## 2018-02-18 ENCOUNTER — Other Ambulatory Visit: Payer: Self-pay | Admitting: Family Medicine

## 2018-02-18 DIAGNOSIS — K5909 Other constipation: Secondary | ICD-10-CM

## 2018-02-18 DIAGNOSIS — E119 Type 2 diabetes mellitus without complications: Secondary | ICD-10-CM

## 2018-02-18 MED FILL — POLYETHYLENE GLYCOL 3350 PO: 14 days supply | Qty: 238 | Fill #4

## 2018-02-19 LAB — BASIC METABOLIC PANEL
BUN/Creatinine Ratio: 15 (ref 12–28)
BUN: 12 mg/dL (ref 8–27)
CO2: 28 mmol/L (ref 20–29)
Calcium: 9.3 mg/dL (ref 8.7–10.3)
Chloride: 102 mmol/L (ref 96–106)
Creatinine, Ser: 0.78 mg/dL (ref 0.57–1.00)
GFR calc Af Amer: 93 mL/min/{1.73_m2} (ref >59)
GFR calc non Af Amer: 81 mL/min/{1.73_m2} (ref >59)
Glucose: 121 mg/dL — ABNORMAL HIGH (ref 65–99)
Potassium: 4.1 mmol/L (ref 3.5–5.2)
Sodium: 144 mmol/L (ref 134–144)

## 2018-02-20 ENCOUNTER — Encounter: Payer: Self-pay | Admitting: Physical Therapy

## 2018-02-24 ENCOUNTER — Telehealth (HOSPITAL_COMMUNITY): Payer: Self-pay | Admitting: Emergency Medicine

## 2018-02-24 NOTE — Telephone Encounter (Signed)
Reaching out to patient to offer assistance regarding upcoming cardiac imaging study; pt verbalizes understanding of appt date/time, parking situation and where to check in, pre-test NPO status and medications ordered, and verified current allergies; name and call back number provided for further questions should they arise Waldo Damian RN Navigator Cardiac Imaging [phone] 

## 2018-02-25 ENCOUNTER — Ambulatory Visit (HOSPITAL_COMMUNITY): Payer: Self-pay

## 2018-03-02 ENCOUNTER — Telehealth: Payer: Self-pay

## 2018-03-02 NOTE — Telephone Encounter (Signed)
Pt needs reschedule of coronary CTA appt that was previously scheduled for 1/15. Pt unable to make appt

## 2018-03-16 ENCOUNTER — Encounter: Payer: Self-pay | Admitting: Family Medicine

## 2018-03-16 MED FILL — ISOSORBIDE MN ER 30 MG TAB: 30 | 30 days supply | Qty: 30 | Fill #2

## 2018-03-16 MED FILL — LOSARTAN-HCTZ 100-25 MG TAB: 100-25 | 30 days supply | Qty: 30 | Fill #4

## 2018-03-16 MED FILL — POLYETHYLENE GLYCOL 3350 PO: 14 days supply | Qty: 238 | Fill #0

## 2018-03-16 MED FILL — ?LEVOTHYROXINE 150 MCG TAB: 150 | 30 days supply | Qty: 30 | Fill #3

## 2018-03-16 NOTE — Telephone Encounter (Signed)
Referral request to GI via mychart

## 2018-03-16 NOTE — Telephone Encounter (Signed)
Neuro referral request via mychart

## 2018-03-19 ENCOUNTER — Other Ambulatory Visit: Payer: Self-pay

## 2018-03-19 ENCOUNTER — Encounter (HOSPITAL_COMMUNITY): Payer: Self-pay

## 2018-03-19 ENCOUNTER — Emergency Department (HOSPITAL_COMMUNITY)
Admission: EM | Admit: 2018-03-19 | Discharge: 2018-03-19 | Disposition: A | Discharge status: Home / Self Care | Payer: Self-pay | Attending: Emergency Medicine | Admitting: Emergency Medicine

## 2018-03-19 DIAGNOSIS — E039 Hypothyroidism, unspecified: Secondary | ICD-10-CM | POA: Insufficient documentation

## 2018-03-19 DIAGNOSIS — Z8673 Personal history of transient ischemic attack (TIA), and cerebral infarction without residual deficits: Secondary | ICD-10-CM | POA: Insufficient documentation

## 2018-03-19 DIAGNOSIS — I10 Essential (primary) hypertension: Secondary | ICD-10-CM | POA: Insufficient documentation

## 2018-03-19 DIAGNOSIS — Z7984 Long term (current) use of oral hypoglycemic drugs: Secondary | ICD-10-CM | POA: Insufficient documentation

## 2018-03-19 DIAGNOSIS — E119 Type 2 diabetes mellitus without complications: Secondary | ICD-10-CM | POA: Insufficient documentation

## 2018-03-19 DIAGNOSIS — N898 Other specified noninflammatory disorders of vagina: Secondary | ICD-10-CM | POA: Insufficient documentation

## 2018-03-19 DIAGNOSIS — M25562 Pain in left knee: Secondary | ICD-10-CM | POA: Insufficient documentation

## 2018-03-19 DIAGNOSIS — Z7982 Long term (current) use of aspirin: Secondary | ICD-10-CM | POA: Insufficient documentation

## 2018-03-19 LAB — URINALYSIS, ROUTINE W REFLEX MICROSCOPIC
Bilirubin Urine: NEGATIVE
Glucose, UA: NEGATIVE mg/dL
Hgb urine dipstick: NEGATIVE
Ketones, ur: NEGATIVE mg/dL
Leukocytes, UA: NEGATIVE
Nitrite: NEGATIVE
Protein, ur: NEGATIVE mg/dL
Specific Gravity, Urine: 1.008 (ref 1.005–1.030)
pH: 8 (ref 5.0–8.0)

## 2018-03-19 LAB — WET PREP, GENITAL
Clue Cells Wet Prep HPF POC: NONE SEEN
Sperm: NONE SEEN
Trich, Wet Prep: NONE SEEN
Yeast Wet Prep HPF POC: NONE SEEN

## 2018-03-19 NOTE — ED Triage Notes (Signed)
Pt arrives via POV from home. Pt reports constipation for 4-7 days. Pt reports that she is passing gas but is unable to have a BM. Pt reports that she has had right knee pain but is no having left knee pain. Pt denies fall or injury. Pt also reports that she has had som vaginal itching and odor. Pt has seen her PCP for this but they have only taken urine samples. UA was negative for anything.

## 2018-03-19 NOTE — Discharge Instructions (Addendum)
I think your knee pain is muscular.  I think your idea of starting water aerobics is very good.  Your pelvic exam was unremarkable and did not reveal an obvious explanation for your symptoms.  I would follow back up with your family doctor and also with your orthopedist with regards to your left leg/knee pain.

## 2018-03-19 NOTE — ED Provider Notes (Signed)
[facility] DEPT Provider Note   CSN: [id number] Arrival date & time: 03/19/18  0726     History   Chief Complaint Chief Complaint  Patient presents with  . Constipation  . Knee Pain    HPI Tiffany Strickland is a 63 y.o. female.  HPI   40yF with multiple complaints. Vaginal odor for 6 months. She says sometimes so strong that it makes her gag. No discharge. No pelvic pain. No urinary complaints. Also b/l knee pain. Usually her R bothers her but more recently her L is. Medially to behind it. No trauma. No swelling. Pain worse with movement.   Past Medical History:  Diagnosis Date  . Colon polyp    adenomatous  . Diabetes mellitus without complication ([place])   . Hyperlipidemia   . Hypertension   . Stroke ([place])   . Thyroid disease     Patient Active Problem List   Diagnosis Date Noted  . OSA (obstructive sleep apnea) 02/03/2017  . Hypothyroidism 02/02/2017  . Chest pain 02/02/2017  . Morbid obesity ([place]) 01/27/2017  . Plantar fasciitis of left foot 11/08/2015  . Non-insulin treated type 2 diabetes mellitus ([place]) 06/08/2015  . Colon cancer screening 06/08/2015  . Bilateral leg edema 12/15/2014  . Blurry vision, bilateral 06/16/2014  . Breast cancer screening 06/16/2014  . Other specified hypothyroidism 06/16/2014  . Other seasonal allergic rhinitis 06/16/2014  . History of stroke 01/31/2014  . Essential hypertension 08/24/2013  . Nonspecific (abnormal) findings on radiological and other examination of gastrointestinal tract 08/12/2013  . Lateral epicondylitis of right elbow 07/21/2013  . Right shoulder pain 07/21/2013  . Sprain of wrist, right 07/21/2013  . Unspecified constipation 04/15/2013  . Rectal bleeding 04/15/2013    Past Surgical History:  Procedure Laterality Date  . ABDOMINAL HYSTERECTOMY    . BLADDER SUSPENSION    . EUS N/A 08/12/2013   Procedure: LOWER ENDOSCOPIC ULTRASOUND (EUS);  Surgeon: Milus Banister, MD;   Location: [facility];  Service: Endoscopy;  Laterality: N/A;  . KNEE SURGERY    . TUBAL LIGATION       OB History    Gravida  6   Para  5   Term  5   Preterm      AB  1   Living  5     SAB  1   TAB      Ectopic      Multiple      Live Births               Home Medications    Prior to Admission medications   Medication Sig Start Date End Date Taking? Authorizing Provider  aspirin (ASPIR-81) 81 MG EC tablet Take 1 tablet (81 mg total) by mouth daily. Swallow whole. 11/08/15   Charlott Rakes, MD  Blood Glucose Monitoring Suppl (TRUE METRIX METER) DEVI 1 each by Does not apply route daily before breakfast. 06/02/17   Charlott Rakes, MD  gabapentin (NEURONTIN) 300 MG capsule Take 1 capsule (300 mg total) by mouth at bedtime. 01/21/18   Charlott Rakes, MD  glucose blood test strip Use 1 time daily before breakfast 05/26/17   Charlott Rakes, MD  isosorbide mononitrate (IMDUR) 30 MG 24 hr tablet Take 1 tablet (30 mg total) by mouth daily. 01/15/18 04/15/18  Charlott Rakes, MD  levothyroxine (SYNTHROID, LEVOTHROID) 175 MCG tablet TAKE 1 TABLET BY MOUTH DAILY BEFORE BREAKFAST. 01/22/18   Charlott Rakes, MD  losartan-hydrochlorothiazide (HYZAAR) 100-25 MG tablet Take  1 tablet by mouth daily. 11/06/17   Charlott Rakes, MD  metFORMIN (GLUCOPHAGE) 500 MG tablet Take 1 tablet (500 mg total) by mouth 2 (two) times daily with a meal. 01/21/18   Charlott Rakes, MD  metoprolol tartrate (LOPRESSOR) 50 MG tablet TAKE 1 TABLET 1 HOUR PRIOR TO TEST 01/20/18   Erlene Quan, PA-C  nitroGLYCERIN (NITROSTAT) 0.4 MG SL tablet Place 1 tablet (0.4 mg total) under the tongue every 5 (five) minutes as needed for chest pain. 02/03/17   Rosita Fire, MD  polyethylene glycol powder (GLYCOLAX/MIRALAX) powder Take 17 g by mouth daily as needed for mild constipation. 02/19/18   Charlott Rakes, MD  TRUEPLUS LANCETS 28G MISC USE AS DIRECTED DAILY BEFORE BREAKFAST. 02/12/17   Charlott Rakes, MD      Family History Family History  Problem Relation Age of Onset  . Hypertension Mother   . Heart disease Maternal Grandmother   . Cancer Maternal Grandmother        ovarian  . Cancer Maternal Grandfather   . Hypertension Other   . Hyperlipidemia Other   . Cancer Other   . Sleep apnea Other   . Obesity Other     Social History Social History   Tobacco Use  . Smoking status: Never Smoker  . Smokeless tobacco: Never Used  Substance Use Topics  . Alcohol use: No  . Drug use: No     Allergies   Pollen extract and Tomato   Review of Systems Review of Systems  All systems reviewed and negative, other than as noted in HPI.  Physical Exam Updated Vital Signs There were no vitals taken for this visit.  Physical Exam Vitals signs and nursing note reviewed.  Constitutional:      General: She is not in acute distress.    Appearance: She is well-developed.  HENT:     Head: Normocephalic and atraumatic.  Eyes:     General:        Right eye: No discharge.        Left eye: No discharge.     Conjunctiva/sclera: Conjunctivae normal.  Neck:     Musculoskeletal: Neck supple.  Cardiovascular:     Rate and Rhythm: Normal rate and regular rhythm.     Heart sounds: Normal heart sounds. No murmur. No friction rub. No gallop.   Pulmonary:     Effort: Pulmonary effort is normal. No respiratory distress.     Breath sounds: Normal breath sounds.  Abdominal:     General: There is no distension.     Palpations: Abdomen is soft.     Tenderness: There is no abdominal tenderness.  Genitourinary:    Comments: Chaperon present. Normal external genitalia. Physiologic appearing discharge.  Musculoskeletal:        General: No tenderness.  Skin:    General: Skin is warm and dry.  Neurological:     Mental Status: She is alert.  Psychiatric:        Behavior: Behavior normal.        Thought Content: Thought content normal.      ED Treatments / Results  Labs (all labs ordered  are listed, but only abnormal results are displayed) Labs Reviewed  WET PREP, GENITAL - Abnormal; Notable for the following components:      Result Value   WBC, Wet Prep HPF POC MODERATE (*)    All other components within normal limits  URINALYSIS, ROUTINE W REFLEX MICROSCOPIC - Abnormal; Notable for the following components:  Color, Urine STRAW (*)    All other components within normal limits  GC/CHLAMYDIA PROBE AMP ([place]) NOT AT [facility]    EKG None  Radiology No results found.  Procedures Procedures (including critical care time)  Medications Ordered in ED Medications - No data to display   Initial Impression / Assessment and Plan / ED Course  I have reviewed the triage vital signs and the nursing notes.  Pertinent labs & imaging results that were available during my care of the patient were reviewed by me and considered in my medical decision making (see chart for details).     62yF with numerous complaints. The more I talk with her the more my impression is that there is a significant anxiety component. Pelvic exam unremarkable. Knee exam as well. She is obese and would benefit from weight loss but I doubt urgent/emergent condition such as DVT/infectious/ischemia/etc.   Final Clinical Impressions(s) / ED Diagnoses   Final diagnoses:  Acute pain of left knee  Vaginal odor    ED Discharge Orders    None       Virgel Manifold, MD 03/26/18 [phone]

## 2018-03-21 LAB — GC/CHLAMYDIA PROBE AMP (~~LOC~~) NOT AT ARMC
Chlamydia: NEGATIVE
Neisseria Gonorrhea: NEGATIVE

## 2018-03-24 MED FILL — POLYETHYLENE GLYCOL 3350 PO: 17 | 14 days supply | Qty: 238 | Fill #0

## 2018-03-25 ENCOUNTER — Other Ambulatory Visit: Payer: Self-pay | Admitting: Pharmacist

## 2018-03-25 DIAGNOSIS — I10 Essential (primary) hypertension: Secondary | ICD-10-CM

## 2018-03-25 MED ORDER — HYDROCHLOROTHIAZIDE 25 MG PO TABS: 25.0000 mg | ORAL_TABLET | Freq: Every day | ORAL | 2 refills | Status: DC

## 2018-03-25 MED ORDER — LOSARTAN POTASSIUM 100 MG PO TABS: 100.0000 mg | ORAL_TABLET | Freq: Every day | ORAL | 2 refills | Status: DC

## 2018-03-25 MED FILL — LOSARTAN POTASSIUM 100 MG T: 100 | 30 days supply | Qty: 30 | Fill #0

## 2018-03-25 MED FILL — HYDROCHLOROTHIAZIDE 25 MG T: 25 | 30 days supply | Qty: 30 | Fill #0

## 2018-03-25 MED FILL — ISOSORBIDE MN ER 30 MG TAB: 30 | 30 days supply | Qty: 30 | Fill #2

## 2018-03-25 MED FILL — ?LEVOTHYROXINE 150 MCG TAB: 150 | 30 days supply | Qty: 30 | Fill #3

## 2018-04-09 ENCOUNTER — Other Ambulatory Visit: Payer: Self-pay | Admitting: Family Medicine

## 2018-04-09 DIAGNOSIS — Z1231 Encounter for screening mammogram for malignant neoplasm of breast: Secondary | ICD-10-CM

## 2018-04-28 ENCOUNTER — Other Ambulatory Visit: Payer: Self-pay | Admitting: Family Medicine

## 2018-04-28 ENCOUNTER — Ambulatory Visit: Payer: Self-pay | Attending: Family Medicine | Admitting: Family Medicine

## 2018-04-28 ENCOUNTER — Telehealth: Payer: Self-pay | Admitting: Family Medicine

## 2018-04-28 ENCOUNTER — Encounter: Payer: Self-pay | Admitting: Family Medicine

## 2018-04-28 ENCOUNTER — Other Ambulatory Visit: Payer: Self-pay

## 2018-04-28 VITALS — BP 164/82 | HR 78 | Temp 98.0°F | Ht 65.0 in | Wt 258.0 lb

## 2018-04-28 DIAGNOSIS — E038 Other specified hypothyroidism: Secondary | ICD-10-CM

## 2018-04-28 DIAGNOSIS — G629 Polyneuropathy, unspecified: Secondary | ICD-10-CM

## 2018-04-28 DIAGNOSIS — I10 Essential (primary) hypertension: Secondary | ICD-10-CM

## 2018-04-28 DIAGNOSIS — E119 Type 2 diabetes mellitus without complications: Secondary | ICD-10-CM

## 2018-04-28 DIAGNOSIS — K5909 Other constipation: Secondary | ICD-10-CM

## 2018-04-28 DIAGNOSIS — R251 Tremor, unspecified: Secondary | ICD-10-CM

## 2018-04-28 LAB — POCT GLYCOSYLATED HEMOGLOBIN (HGB A1C): HBA1C, POC (CONTROLLED DIABETIC RANGE): 6.9 % (ref 0.0–7.0)

## 2018-04-28 LAB — GLUCOSE, POCT (MANUAL RESULT ENTRY): POC GLUCOSE: 97 mg/dL (ref 70–99)

## 2018-04-28 MED ORDER — ISOSORBIDE MONONITRATE ER 60 MG PO TB24: 60.0000 mg | ORAL_TABLET | Freq: Every day | ORAL | 6 refills | Status: DC

## 2018-04-28 MED ORDER — HYDROCHLOROTHIAZIDE 25 MG PO TABS: 25.0000 mg | ORAL_TABLET | Freq: Every day | ORAL | 6 refills | Status: DC

## 2018-04-28 MED ORDER — LOSARTAN POTASSIUM 100 MG PO TABS: 100.0000 mg | ORAL_TABLET | Freq: Every day | ORAL | 6 refills | Status: DC

## 2018-04-28 MED FILL — LOSARTAN POTASSIUM 100 MG T: 100 | 90 days supply | Qty: 90 | Fill #0

## 2018-04-28 MED FILL — ISOSORBIDE MN ER 60 MG TAB: 60 | 90 days supply | Qty: 90 | Fill #0

## 2018-04-28 MED FILL — HYDROCHLOROTHIAZIDE 25 MG T: 25 | 90 days supply | Qty: 90 | Fill #0

## 2018-04-28 NOTE — Telephone Encounter (Signed)
Her thyroid labs were drawn today and the results will determine her dose of levothyroxine after which I will refill tomorrow.

## 2018-04-28 NOTE — Progress Notes (Signed)
° °Subjective:  °[name], female    DOB: 08/09/1955  Age: 62 y.o. MRN: [id number] ° °CC: Diabetes ° ° °HPI °Tiffany Strickland is a 62-year-old female with a history of hypertension, type 2 diabetes ( A1c of 6.9), hypothyroidism here for a follow-up visit. °She has resisted commencing medications for diabetes and has been using turmeric. °A1c of 6.9 which is down from 7.5 previously.  She never commenced metformin and wants to work on her lifestyle and has been adhering strictly to a diabetic diet. °Her blood pressure is elevated but she endorses compliance with her antihypertensives. °Tolerating levothyroxine which she takes for hypothyroidism. ° °She continues to experience numbness in her right ring finger which has been present for the last 3 months.  I had placed on gabapentin at her last office visit however she declined taking it and states symptoms with purposeful movements but not bothersome.  She also noticed left hand tremors which occurs with purposeful movement and is absent at rest.  She does not drink alcohol, does not smoke and is unsure of her family history.  She denies problems with her gait. ° °She has no additional concerns today. ° °Past Medical History:  °Diagnosis Date  °• Colon polyp   ° adenomatous  °• Diabetes mellitus without complication (HCC)   °• Hyperlipidemia   °• Hypertension   °• Stroke (HCC)   °• Thyroid disease   ° ° °Past Surgical History:  °Procedure Laterality Date  °• ABDOMINAL HYSTERECTOMY    °• BLADDER SUSPENSION    °• EUS N/A 08/12/2013  ° Procedure: LOWER ENDOSCOPIC ULTRASOUND (EUS);  Surgeon: Daniel P Jacobs, MD;  Location: [facility];  Service: Endoscopy;  Laterality: N/A;  °• KNEE SURGERY    °• TUBAL LIGATION    ° ° °Family History  °Problem Relation Age of Onset  °• Hypertension Mother   °• Heart disease Maternal Grandmother   °• Cancer Maternal Grandmother   °     ovarian  °• Cancer Maternal Grandfather   °• Hypertension Other   °• Hyperlipidemia Other    °• Cancer Other   °• Sleep apnea Other   °• Obesity Other   ° ° °Allergies  °Allergen Reactions  °• Pollen Extract Other (See Comments)  °  Seasonal allergies  °• Tomato Itching  °• Wool Alcohol [Lanolin] Itching  °  Reaction unknown  ° ° °Outpatient Medications Prior to Visit  °Medication Sig Dispense Refill  °• aspirin (ASPIR-81) 81 MG EC tablet Take 1 tablet (81 mg total) by mouth daily. Swallow whole. 30 tablet 12  °• Blood Glucose Monitoring Suppl (TRUE METRIX METER) DEVI 1 each by Does not apply route daily before breakfast. 1 Device 0  °• glucose blood test strip Use 1 time daily before breakfast 100 each 12  °• levothyroxine (SYNTHROID, LEVOTHROID) 175 MCG tablet TAKE 1 TABLET BY MOUTH DAILY BEFORE BREAKFAST. 30 tablet 3  °• Multiple Vitamin (MULTIVITAMIN WITH MINERALS) TABS tablet Take 1 tablet by mouth daily.    °• Multiple Vitamins-Minerals (ECHINACEA ACZ PO) Take 1 tablet by mouth daily.    °• naproxen sodium (ALEVE) 220 MG tablet Take 220 mg by mouth as needed (headache/pain).    °• nitroGLYCERIN (NITROSTAT) 0.4 MG SL tablet Place 1 tablet (0.4 mg total) under the tongue every 5 (five) minutes as needed for chest pain. 20 tablet 0  °• polyethylene glycol powder (GLYCOLAX/MIRALAX) powder Take 17 g by mouth daily as needed for mild constipation. 255 g 0  °•   0   TRUEPLUS LANCETS 28G MISC USE AS DIRECTED DAILY BEFORE BREAKFAST. 100 each 12   TURMERIC PO Take 1 tablet by mouth daily.     hydrochlorothiazide (HYDRODIURIL) 25 MG tablet Take 1 tablet (25 mg total) by mouth daily. 30 tablet 2   losartan (COZAAR) 100 MG tablet Take 1 tablet (100 mg total) by mouth daily. 30 tablet 2   gabapentin (NEURONTIN) 300 MG capsule Take 1 capsule (300 mg total) by mouth at bedtime. (Patient not taking: Reported on 03/19/2018) 60 capsule 6   levothyroxine (SYNTHROID, LEVOTHROID) 150 MCG tablet Take 150 mcg by mouth daily.     metFORMIN (GLUCOPHAGE) 500 MG tablet Take 1 tablet (500 mg total) by mouth 2 (two) times  daily with a meal. (Patient not taking: Reported on 03/19/2018) 60 tablet 6   isosorbide mononitrate (IMDUR) 30 MG 24 hr tablet Take 1 tablet (30 mg total) by mouth daily. 30 tablet 2   metoprolol tartrate (LOPRESSOR) 50 MG tablet TAKE 1 TABLET 1 HOUR PRIOR TO TEST (Patient not taking: Reported on 03/19/2018) 1 tablet 0   No facility-administered medications prior to visit.      ROS Review of Systems  Constitutional: Negative for activity change, appetite change and fatigue.  HENT: Negative for congestion, sinus pressure and sore throat.   Eyes: Negative for visual disturbance.  Respiratory: Negative for cough, chest tightness, shortness of breath and wheezing.   Cardiovascular: Negative for chest pain and palpitations.  Gastrointestinal: Negative for abdominal distention, abdominal pain and constipation.  Endocrine: Negative for polydipsia.  Genitourinary: Negative for dysuria and frequency.  Musculoskeletal: Negative for arthralgias and back pain.  Skin: Negative for rash.  Neurological: Positive for numbness. Negative for tremors and light-headedness.  Hematological: Does not bruise/bleed easily.  Psychiatric/Behavioral: Negative for agitation and behavioral problems.    Objective:  BP (!) 164/82    Pulse 78    Temp 98 F (36.7 C) (Oral)    Ht 5' 5" (1.651 m)    Wt 258 lb (117 kg)    SpO2 98%    BMI 42.93 kg/m   BP/Weight 04/28/2018 03/19/2018 91/47/8295  Systolic BP 621 308 657  Diastolic BP 82 93 78  Wt. (Lbs) 258 263 -  BMI 42.93 43.77 -      Physical Exam Constitutional:      Appearance: She is well-developed.  Cardiovascular:     Rate and Rhythm: Normal rate.     Heart sounds: Normal heart sounds. No murmur.  Pulmonary:     Effort: Pulmonary effort is normal.     Breath sounds: Normal breath sounds. No wheezing or rales.  Chest:     Chest wall: No tenderness.  Abdominal:     General: Bowel sounds are normal. There is no distension.     Palpations: Abdomen is  soft. There is no mass.     Tenderness: There is no abdominal tenderness.  Musculoskeletal: Normal range of motion.  Neurological:     Mental Status: She is alert and oriented to person, place, and time.  Psychiatric:        Mood and Affect: Mood normal.        Behavior: Behavior normal.     CMP Latest Ref Rng & Units 02/18/2018 11/06/2017 02/02/2017  Glucose 65 - 99 mg/dL 121(H) 115(H) -  BUN 8 - 27 mg/dL 12 12 -  Creatinine 0.57 - 1.00 mg/dL 0.78 0.76 0.65  Sodium 134 - 144 mmol/L 144 144 -  Potassium 3.5 -  4.1 4.2 -  °Chloride 96 - 106 mmol/L 102 99 -  °CO2 20 - 29 mmol/L 28 27 -  °Calcium 8.7 - 10.3 mg/dL 9.3 9.8 -  °Total Protein 6.0 - 8.5 g/dL - 6.9 -  °Total Bilirubin 0.0 - 1.2 mg/dL - 0.3 -  °Alkaline Phos 39 - 117 IU/L - 90 -  °AST 0 - 40 IU/L - 13 -  °ALT 0 - 32 IU/L - 16 -  ° ° °Lipid Panel  °   °Component Value Date/Time  ° CHOL 163 11/06/2017 1028  ° TRIG 74 11/06/2017 1028  ° HDL 54 11/06/2017 1028  ° CHOLHDL 3.0 11/06/2017 1028  ° CHOLHDL 3.1 11/08/2015 1210  ° VLDL 13 11/08/2015 1210  ° LDLCALC 94 11/06/2017 1028  ° ° °CBC °   °Component Value Date/Time  ° WBC 6.9 02/02/2017 2242  ° RBC 4.22 02/02/2017 2242  ° HGB 12.7 02/02/2017 2242  ° HCT 39.3 02/02/2017 2242  ° PLT 306 02/02/2017 2242  ° MCV 93.1 02/02/2017 2242  ° MCH 30.1 02/02/2017 2242  ° MCHC 32.3 02/02/2017 2242  ° RDW 14.1 02/02/2017 2242  ° LYMPHSABS 1.8 04/15/2013 0958  ° MONOABS 0.5 04/15/2013 0958  ° EOSABS 0.4 04/15/2013 0958  ° BASOSABS 0.1 04/15/2013 0958  ° ° °Lab Results  °Component Value Date  ° HGBA1C 6.9 04/28/2018  ° ° °Assessment & Plan:  ° °1. Non-insulin treated type 2 diabetes mellitus (HCC) °Diet controlled with A1c of 6.9 °She declines taking metformin °Counseled on Diabetic diet, my plate method, 150 minutes of moderate intensity exercise/week °Keep blood sugar logs with fasting goals of 80-120 mg/dl, random of less than 180 and in the event of sugars less than 60 mg/dl or greater than 400 mg/dl  please notify the clinic ASAP. °It is recommended that you undergo annual eye exams and annual foot exams. °Pneumonia vaccine is recommended. °- POCT glucose (manual entry) °- POCT glycosylated hemoglobin (Hb A1C) ° °2. Essential hypertension °Uncontrolled °Increase isosorbide dose °Counseled on blood pressure goal of less than 130/80, low-sodium, DASH diet, medication compliance, 150 minutes of moderate intensity exercise per week. °Discussed medication compliance, adverse effects. °- losartan (COZAAR) 100 MG tablet; Take 1 tablet (100 mg total) by mouth daily.  Dispense: 30 tablet; Refill: 6 °- hydrochlorothiazide (HYDRODIURIL) 25 MG tablet; Take 1 tablet (25 mg total) by mouth daily.  Dispense: 30 tablet; Refill: 6 °- isosorbide mononitrate (IMDUR) 60 MG 24 hr tablet; Take 1 tablet (60 mg total) by mouth daily.  Dispense: 30 tablet; Refill: 6 °- CMP14+EGFR ° °3. Tremor °Tremor is essential °Unknown history of familial tremors but she will check °Low suspicion for Parkinson's ° °4. Neuropathy °Placed on gabapentin in the past which she declined taking ° ° °Meds ordered this encounter  °Medications  °• losartan (COZAAR) 100 MG tablet  °  Sig: Take 1 tablet (100 mg total) by mouth daily.  °  Dispense:  30 tablet  °  Refill:  6  °• hydrochlorothiazide (HYDRODIURIL) 25 MG tablet  °  Sig: Take 1 tablet (25 mg total) by mouth daily.  °  Dispense:  30 tablet  °  Refill:  6  °• isosorbide mononitrate (IMDUR) 60 MG 24 hr tablet  °  Sig: Take 1 tablet (60 mg total) by mouth daily.  °  Dispense:  30 tablet  °  Refill:  6  °  Dose increase  ° ° °Follow-up: Return in about 6 months (around 10/29/2018) for Follow-up   of chronic medical conditions.  ° ° ° ° ° °Enobong Newlin, MD, FAAFP. °[facility] °[place], [place] °[phone]   °04/28/2018, 11:52 AM °

## 2018-04-28 NOTE — Addendum Note (Signed)
Addended by: Octaviano Glow on: 04/28/2018 01:46 PM   Modules accepted: Orders

## 2018-04-28 NOTE — Progress Notes (Signed)
Ring finger on right hand is numb at fingertip.  Left hand shakes.

## 2018-04-28 NOTE — Telephone Encounter (Signed)
New Message   Pt states that Dr. Margarita Rana was suppose to fill all of her medications today after her appt but the pt says her levothyroxine (SYNTHROID, LEVOTHROID) 175 MCG tablet is not at the pharmacy and that is the one she really needs. Please f/u

## 2018-04-28 NOTE — Telephone Encounter (Signed)
Patient was informed that medication will be refilled after lab results are back.

## 2018-04-29 LAB — CMP14+EGFR
A/G RATIO: 1.5 (ref 1.2–2.2)
ALK PHOS: 81 IU/L (ref 39–117)
ALT: 14 IU/L (ref 0–32)
AST: 14 IU/L (ref 0–40)
Albumin: 4.1 g/dL (ref 3.8–4.8)
BILIRUBIN TOTAL: 0.3 mg/dL (ref 0.0–1.2)
BUN/Creatinine Ratio: 16 (ref 12–28)
BUN: 11 mg/dL (ref 8–27)
CALCIUM: 9.6 mg/dL (ref 8.7–10.3)
CHLORIDE: 102 mmol/L (ref 96–106)
CO2: 25 mmol/L (ref 20–29)
Creatinine, Ser: 0.7 mg/dL (ref 0.57–1.00)
GFR calc Af Amer: 106 mL/min/{1.73_m2} (ref >59)
GFR, EST NON AFRICAN AMERICAN: 92 mL/min/{1.73_m2} (ref >59)
Globulin, Total: 2.8 g/dL (ref 1.5–4.5)
Glucose: 91 mg/dL (ref 65–99)
POTASSIUM: 4.3 mmol/L (ref 3.5–5.2)
Sodium: 137 mmol/L (ref 134–144)
Total Protein: 6.9 g/dL (ref 6.0–8.5)

## 2018-04-29 MED FILL — POLYETHYLENE GLYCOL 3350 PO: 17 | 14 days supply | Qty: 238 | Fill #0

## 2018-04-30 ENCOUNTER — Other Ambulatory Visit: Payer: Self-pay | Admitting: Family Medicine

## 2018-04-30 ENCOUNTER — Other Ambulatory Visit: Payer: Self-pay

## 2018-04-30 DIAGNOSIS — E038 Other specified hypothyroidism: Secondary | ICD-10-CM

## 2018-04-30 LAB — TSH: TSH: 3.62 u[IU]/mL (ref 0.450–4.500)

## 2018-04-30 LAB — SPECIMEN STATUS REPORT

## 2018-04-30 MED ORDER — LEVOTHYROXINE SODIUM 175 MCG PO TABS: ORAL_TABLET | ORAL | 6 refills | Status: DC

## 2018-04-30 MED ORDER — NITROGLYCERIN 0.4 MG SL SUBL: 0.4000 mg | SUBLINGUAL_TABLET | SUBLINGUAL | 0 refills | Status: DC | PRN

## 2018-04-30 MED FILL — NITROGLYCERIN 0.4 MG TAB SL: 0.4 | 20 days supply | Qty: 25 | Fill #0

## 2018-04-30 MED FILL — LEVOTHYROXINE 175 MCG TAB: 175 | 30 days supply | Qty: 30 | Fill #0

## 2018-05-05 ENCOUNTER — Ambulatory Visit: Payer: Self-pay

## 2018-05-15 ENCOUNTER — Encounter: Payer: Self-pay | Admitting: Family Medicine

## 2018-05-18 ENCOUNTER — Other Ambulatory Visit: Payer: Self-pay | Admitting: Family Medicine

## 2018-05-18 DIAGNOSIS — E038 Other specified hypothyroidism: Secondary | ICD-10-CM

## 2018-05-18 MED ORDER — LEVOTHYROXINE SODIUM 150 MCG PO TABS: ORAL_TABLET | ORAL | 3 refills | Status: DC

## 2018-05-26 ENCOUNTER — Ambulatory Visit: Payer: Self-pay

## 2018-06-02 MED FILL — LEVOTHYROXINE 150 MCG TAB: 150 | 30 days supply | Qty: 30 | Fill #0

## 2018-06-03 ENCOUNTER — Telehealth: Payer: Self-pay | Admitting: *Deleted

## 2018-06-03 NOTE — Telephone Encounter (Signed)
06/03/18 LMOM @ 3:15 pm, re: follow up appointment.

## 2018-06-12 ENCOUNTER — Telehealth: Payer: Self-pay | Admitting: Cardiology

## 2018-06-12 NOTE — Telephone Encounter (Signed)
LVMTCB to schedule virtual visit with Dr. Percival Spanish. Called pt from recall list.

## 2018-06-13 ENCOUNTER — Encounter: Payer: Self-pay | Admitting: Family Medicine

## 2018-06-28 NOTE — Progress Notes (Signed)
Virtual Visit via Video Note   This visit type was conducted due to national recommendations for restrictions regarding the COVID-19 Pandemic (e.g. social distancing) in an effort to limit this patient's exposure and mitigate transmission in our community.  Due to her co-morbid illnesses, this patient is at least at moderate risk for complications without adequate follow up.  This format is felt to be most appropriate for this patient at this time.  All issues noted in this document were discussed and addressed.  A limited physical exam was performed with this format.  Please refer to the patient's chart for her consent to telehealth for [facility].   Date:  1/63/8466   ID:  Tiffany Strickland, DOB [birth year], MRN [id number]  Patient Location: Home Provider Location: Home  PCP:  Charlott Rakes, MD  Cardiologist:  Minus Breeding, MD  Electrophysiologist:  None   Evaluation Performed:  Follow-Up Visit  Chief Complaint:    Chest pain  History of Present Illness:    Tiffany Strickland is a 63 y.o. female who presents for follow up of chest pain.  I saw her in the hospital in Dec 2018.  Echocardiogram showed normal LV function, PA peak pressure 48 mmHg. Myoview performed on 02/13/2017 was low risk..  She had difficult to control BP and was sent for a sleep study.   She had severe sleep apnea.   She was in the hospital in Dec.  However, she did have elevated pulmonary pressure.   Since I last saw her she has done well.  The patient denies any new symptoms such as chest discomfort, neck or arm discomfort. There has been no new shortness of breath, PND or orthopnea. There have been no reported palpitations, presyncope or syncope.   The patient does not have symptoms concerning for COVID-19 infection (fever, chills, cough, or new shortness of breath).    Past Medical History:  Diagnosis Date  . Colon polyp    adenomatous  . Diabetes mellitus without complication ([place])   . Hyperlipidemia   .  Hypertension   . Stroke ([place])   . Thyroid disease    Past Surgical History:  Procedure Laterality Date  . ABDOMINAL HYSTERECTOMY    . BLADDER SUSPENSION    . EUS N/A 08/12/2013   Procedure: LOWER ENDOSCOPIC ULTRASOUND (EUS);  Surgeon: Milus Banister, MD;  Location: [facility];  Service: Endoscopy;  Laterality: N/A;  . KNEE SURGERY    . TUBAL LIGATION       Current Meds  Medication Sig  . aspirin (ASPIR-81) 81 MG EC tablet Take 1 tablet (81 mg total) by mouth daily. Swallow whole.  . Blood Glucose Monitoring Suppl (TRUE METRIX METER) DEVI 1 each by Does not apply route daily before breakfast.  . glucose blood test strip Use 1 time daily before breakfast  . hydrochlorothiazide (HYDRODIURIL) 25 MG tablet Take 1 tablet (25 mg total) by mouth daily.  . isosorbide mononitrate (IMDUR) 60 MG 24 hr tablet Take 1 tablet (60 mg total) by mouth daily.  Marland Kitchen levothyroxine (SYNTHROID, LEVOTHROID) 150 MCG tablet TAKE 1 TABLET BY MOUTH DAILY BEFORE BREAKFAST.  Marland Kitchen losartan (COZAAR) 100 MG tablet Take 1 tablet (100 mg total) by mouth daily.  . Multiple Vitamin (MULTIVITAMIN WITH MINERALS) TABS tablet Take 1 tablet by mouth daily.  . Multiple Vitamins-Minerals (ECHINACEA ACZ PO) Take 1 tablet by mouth daily.  . nitroGLYCERIN (NITROSTAT) 0.4 MG SL tablet Place 1 tablet (0.4 mg total) under the tongue every 5 (five) minutes  as needed for chest pain.  . polyethylene glycol powder (GLYCOLAX/MIRALAX) powder TAKE 17 G BY MOUTH DAILY AS NEEDED FOR MILD CONSTIPATION.     Allergies:   Pollen extract; Tomato; and Wool alcohol [lanolin]   Social History   Tobacco Use  . Smoking status: Never Smoker  . Smokeless tobacco: Never Used  Substance Use Topics  . Alcohol use: No  . Drug use: No     Family Hx: The patient's family history includes Cancer in her maternal grandfather, maternal grandmother, and another family member; Heart disease in her maternal grandmother; Hyperlipidemia in an other family member;  Hypertension in her mother and another family member; Obesity in an other family member; Sleep apnea in an other family member.  ROS:   Please see the history of present illness.    As stated in the HPI and negative for all other systems.   Prior CV studies:   The following studies were reviewed today:  Sleep study  Labs/Other Tests and Data Reviewed:    EKG:  No ECG reviewed.  Recent Labs: 04/28/2018: ALT 14; BUN 11; Creatinine, Ser 0.70; Potassium 4.3; Sodium 137; TSH 3.620   Recent Lipid Panel Lab Results  Component Value Date/Time   CHOL 163 11/06/2017 10:28 AM   TRIG 74 11/06/2017 10:28 AM   HDL 54 11/06/2017 10:28 AM   CHOLHDL 3.0 11/06/2017 10:28 AM   CHOLHDL 3.1 11/08/2015 12:10 PM   LDLCALC 94 11/06/2017 10:28 AM    Wt Readings from Last 3 Encounters:  06/29/18 258 lb (117 kg)  04/28/18 258 lb (117 kg)  03/19/18 263 lb (119.3 kg)     Objective:    Vital Signs:  Wt 258 lb (117 kg) Comment: Pt was unable to weigh. Previous weight is recorded  BMI 42.93 kg/m    VITAL SIGNS:  reviewed GEN:  no acute distress RESPIRATORY:  normal respiratory effort, symmetric expansion NEURO:  alert and oriented x 3, no obvious focal deficit PSYCH:  normal affect  ASSESSMENT & PLAN:    CHEST PAIN:    She no longer has this.  No further work-up.  PULMONARY HTN:  This was found on echo late last year.  I will follow this with an echo next year.  SLEEP APNEA: I have contacted our sleep nurse as this treatment was never completed and I think it is important to management of her blood pressure and potentially her pulmonary hypertension.  We had a long discussion about this.  I would sleep program will contact her.  HTN: Her blood pressure is slightly elevated.  She is going to keep a blood pressure diary and borrowed the device from her daughter.  Further management and med titration will be based on her response to management of her sleep apnea and results of her blood pressure  diary.  COVID-19 Education: The signs and symptoms of COVID-19 were discussed with the patient and how to seek care for testing (follow up with PCP or arrange E-visit).  The importance of social distancing was discussed today.  Time:   Today, I have spent 25 minutes with the patient with telehealth technology discussing the above problems.     Medication Adjustments/Labs and Tests Ordered: Current medicines are reviewed at length with the patient today.  Concerns regarding medicines are outlined above.   Tests Ordered: No orders of the defined types were placed in this encounter.   Medication Changes: No orders of the defined types were placed in this encounter.  Disposition:  Follow up with me in one year.   Signed, Minus Breeding, MD  06/29/2018 12:29 PM    [facility]

## 2018-06-29 ENCOUNTER — Encounter: Payer: Self-pay | Admitting: Cardiology

## 2018-06-29 ENCOUNTER — Telehealth (INDEPENDENT_AMBULATORY_CARE_PROVIDER_SITE_OTHER): Payer: Self-pay | Admitting: Cardiology

## 2018-06-29 VITALS — Wt 258.0 lb

## 2018-06-29 DIAGNOSIS — R0789 Other chest pain: Secondary | ICD-10-CM

## 2018-06-29 DIAGNOSIS — G4733 Obstructive sleep apnea (adult) (pediatric): Secondary | ICD-10-CM

## 2018-06-29 DIAGNOSIS — I10 Essential (primary) hypertension: Secondary | ICD-10-CM

## 2018-06-29 DIAGNOSIS — I272 Pulmonary hypertension, unspecified: Secondary | ICD-10-CM | POA: Insufficient documentation

## 2018-06-29 DIAGNOSIS — Z7189 Other specified counseling: Secondary | ICD-10-CM | POA: Insufficient documentation

## 2018-06-29 NOTE — Patient Instructions (Signed)

## 2018-07-06 ENCOUNTER — Encounter: Payer: Self-pay | Admitting: Family Medicine

## 2018-07-07 ENCOUNTER — Other Ambulatory Visit: Payer: Self-pay | Admitting: Family Medicine

## 2018-07-07 DIAGNOSIS — G629 Polyneuropathy, unspecified: Secondary | ICD-10-CM

## 2018-07-07 DIAGNOSIS — K5909 Other constipation: Secondary | ICD-10-CM

## 2018-07-07 DIAGNOSIS — E038 Other specified hypothyroidism: Secondary | ICD-10-CM

## 2018-07-07 DIAGNOSIS — I10 Essential (primary) hypertension: Secondary | ICD-10-CM

## 2018-07-07 DIAGNOSIS — E119 Type 2 diabetes mellitus without complications: Secondary | ICD-10-CM

## 2018-07-07 MED ORDER — POLYETHYLENE GLYCOL 3350 17 GM/SCOOP PO POWD: 17.0000 g | Freq: Every day | ORAL | 2 refills | Status: DC | PRN

## 2018-07-07 MED ORDER — ISOSORBIDE MONONITRATE ER 60 MG PO TB24: 60.0000 mg | ORAL_TABLET | Freq: Every day | ORAL | 6 refills | Status: DC

## 2018-07-07 MED ORDER — GABAPENTIN 300 MG PO CAPS: 300.0000 mg | ORAL_CAPSULE | Freq: Every day | ORAL | 6 refills | Status: DC

## 2018-07-07 MED ORDER — LEVOTHYROXINE SODIUM 150 MCG PO TABS: ORAL_TABLET | ORAL | 3 refills | Status: DC

## 2018-07-07 MED ORDER — LOSARTAN POTASSIUM 100 MG PO TABS: 100.0000 mg | ORAL_TABLET | Freq: Every day | ORAL | 6 refills | Status: AC

## 2018-07-07 MED ORDER — NITROGLYCERIN 0.4 MG SL SUBL: 0.4000 mg | SUBLINGUAL_TABLET | SUBLINGUAL | 0 refills | Status: DC | PRN

## 2018-07-07 MED ORDER — HYDROCHLOROTHIAZIDE 25 MG PO TABS: 25.0000 mg | ORAL_TABLET | Freq: Every day | ORAL | 6 refills | Status: AC

## 2018-07-07 MED ORDER — METFORMIN HCL 500 MG PO TABS: 500.0000 mg | ORAL_TABLET | Freq: Two times a day (BID) | ORAL | 6 refills | Status: DC

## 2018-07-14 ENCOUNTER — Encounter: Payer: Self-pay | Admitting: Family Medicine

## 2018-07-14 ENCOUNTER — Other Ambulatory Visit: Payer: Self-pay

## 2018-07-14 ENCOUNTER — Ambulatory Visit: Payer: Self-pay | Attending: Family Medicine | Admitting: Family Medicine

## 2018-07-14 VITALS — BP 148/81 | HR 86 | Temp 98.4°F | Ht 65.0 in | Wt 256.4 lb

## 2018-07-14 DIAGNOSIS — K2960 Other gastritis without bleeding: Secondary | ICD-10-CM

## 2018-07-14 DIAGNOSIS — F419 Anxiety disorder, unspecified: Secondary | ICD-10-CM

## 2018-07-14 DIAGNOSIS — E038 Other specified hypothyroidism: Secondary | ICD-10-CM

## 2018-07-14 DIAGNOSIS — Z794 Long term (current) use of insulin: Secondary | ICD-10-CM

## 2018-07-14 DIAGNOSIS — E119 Type 2 diabetes mellitus without complications: Secondary | ICD-10-CM

## 2018-07-14 DIAGNOSIS — E1169 Type 2 diabetes mellitus with other specified complication: Secondary | ICD-10-CM

## 2018-07-14 DIAGNOSIS — I10 Essential (primary) hypertension: Secondary | ICD-10-CM

## 2018-07-14 LAB — POCT GLYCOSYLATED HEMOGLOBIN (HGB A1C): HbA1c, POC (controlled diabetic range): 6.7 % (ref 0.0–7.0)

## 2018-07-14 LAB — GLUCOSE, POCT (MANUAL RESULT ENTRY): POC Glucose: 95 mg/dl (ref 70–99)

## 2018-07-14 MED ORDER — GLUCOSE BLOOD VI STRP: ORAL_STRIP | 12 refills | Status: DC

## 2018-07-14 MED ORDER — TRUEPLUS LANCETS 28G MISC: 12 refills | Status: DC

## 2018-07-14 MED ORDER — HYDROXYZINE HCL 25 MG PO TABS: 25.0000 mg | ORAL_TABLET | Freq: Two times a day (BID) | ORAL | 1 refills | Status: DC | PRN

## 2018-07-14 MED ORDER — TRUE METRIX METER DEVI: 1.0000 | Freq: Every day | 0 refills | Status: AC

## 2018-07-14 MED ORDER — TRUE METRIX METER DEVI: 1.0000 | Freq: Every day | 0 refills | Status: DC

## 2018-07-14 NOTE — Progress Notes (Signed)
Subjective:  [name], female    DOB: 07-Jan-1956  Age: 63 y.o. MRN: [id number]  CC: Diabetes   HPI Tiffany Strickland is a 63 year old female with a history of hypertension, type 2 diabetes ( A1c of 6.7), hypothyroidism here for a follow-up visit. She has resisted commencing medications for diabetes and has been using turmeric. Her A1c is 6.7 today.  She requests prescription for glucometer and test strips as her current one is broken.  She has noticed a sensation of warmth which rises to her chest and is of sudden onset.  On one occasion at home she thought she could smell smoke in her pillow as her room is close to the room of her neighbor next door who smokes.  But then again while at her daughter's house she did experience similar symptoms but her daughter does not smoke and neither does the patient.  She denies dyspnea. Endorses intermittent gassiness and feeling bloated. Unsure if the symptoms are related to anxiety as recently she has had close friends diagnosed with cancer and someone close to her passed yesterday from cancer as well.  Compliant with levothyroxine which she takes for hypothyroidism and is also compliant with her antihypertensive. She had a visit with Dr. Percival Spanish on 06/29/2018 and plan is to follow-up in 1 year.   Past Medical History:  Diagnosis Date  . Colon polyp    adenomatous  . Diabetes mellitus without complication ([place])   . Hyperlipidemia   . Hypertension   . Stroke ([place])   . Thyroid disease     Past Surgical History:  Procedure Laterality Date  . ABDOMINAL HYSTERECTOMY    . BLADDER SUSPENSION    . EUS N/A 08/12/2013   Procedure: LOWER ENDOSCOPIC ULTRASOUND (EUS);  Surgeon: Milus Banister, MD;  Location: [facility];  Service: Endoscopy;  Laterality: N/A;  . KNEE SURGERY    . TUBAL LIGATION      Family History  Problem Relation Age of Onset  . Hypertension Mother   . Heart disease Maternal Grandmother   . Cancer Maternal  Grandmother        ovarian  . Cancer Maternal Grandfather   . Hypertension Other   . Hyperlipidemia Other   . Cancer Other   . Sleep apnea Other   . Obesity Other     Allergies  Allergen Reactions  . Pollen Extract Other (See Comments)    Seasonal allergies  . Tomato Itching  . Wool Alcohol [Lanolin] Itching    Reaction unknown    Outpatient Medications Prior to Visit  Medication Sig Dispense Refill  . aspirin (ASPIR-81) 81 MG EC tablet Take 1 tablet (81 mg total) by mouth daily. Swallow whole. 30 tablet 12  . gabapentin (NEURONTIN) 300 MG capsule Take 1 capsule (300 mg total) by mouth at bedtime. 60 capsule 6  . hydrochlorothiazide (HYDRODIURIL) 25 MG tablet Take 1 tablet (25 mg total) by mouth daily. 30 tablet 6  . isosorbide mononitrate (IMDUR) 60 MG 24 hr tablet Take 1 tablet (60 mg total) by mouth daily. 30 tablet 6  . levothyroxine (SYNTHROID) 150 MCG tablet TAKE 1 TABLET BY MOUTH DAILY BEFORE BREAKFAST. 30 tablet 3  . losartan (COZAAR) 100 MG tablet Take 1 tablet (100 mg total) by mouth daily. 30 tablet 6  . nitroGLYCERIN (NITROSTAT) 0.4 MG SL tablet Place 1 tablet (0.4 mg total) under the tongue every 5 (five) minutes as needed for chest pain. 20 tablet 0  . metFORMIN (GLUCOPHAGE) 500  MG tablet Take 1 tablet (500 mg total) by mouth 2 (two) times daily with a meal. (Patient not taking: Reported on 07/14/2018) 60 tablet 6  . Multiple Vitamin (MULTIVITAMIN WITH MINERALS) TABS tablet Take 1 tablet by mouth daily.    . Multiple Vitamins-Minerals (ECHINACEA ACZ PO) Take 1 tablet by mouth daily.    . naproxen sodium (ALEVE) 220 MG tablet Take 220 mg by mouth as needed (headache/pain).    . polyethylene glycol powder (GLYCOLAX/MIRALAX) 17 GM/SCOOP powder Take 17 g by mouth daily as needed for mild constipation. (Patient not taking: Reported on 07/14/2018) 255 g 2  . TURMERIC PO Take 1 tablet by mouth daily.    . Blood Glucose Monitoring Suppl (TRUE METRIX METER) DEVI 1 each by Does not  apply route daily before breakfast. (Patient not taking: Reported on 07/14/2018) 1 Device 0  . glucose blood test strip Use 1 time daily before breakfast (Patient not taking: Reported on 07/14/2018) 100 each 12  . TRUEPLUS LANCETS 28G MISC USE AS DIRECTED DAILY BEFORE BREAKFAST. (Patient not taking: Reported on 06/29/2018) 100 each 12   No facility-administered medications prior to visit.      ROS Review of Systems  Constitutional: Negative for activity change, appetite change and fatigue.  HENT: Negative for congestion, sinus pressure and sore throat.   Eyes: Negative for visual disturbance.  Respiratory: Negative for cough, chest tightness, shortness of breath and wheezing.   Cardiovascular: Negative for chest pain and palpitations.  Gastrointestinal: Negative for abdominal distention, abdominal pain and constipation.  Endocrine: Negative for polydipsia.  Genitourinary: Negative for dysuria and frequency.  Musculoskeletal:       See hpi  Skin: Negative for rash.  Neurological: Negative for tremors, light-headedness and numbness.  Hematological: Does not bruise/bleed easily.  Psychiatric/Behavioral: Negative for agitation and behavioral problems.    Objective:  BP (!) 148/81   Pulse 86   Temp 98.4 F (36.9 C) (Oral)   Ht 5\' 5"  (1.651 m)   Wt 256 lb 6.4 oz (116.3 kg)   SpO2 97%   BMI 42.67 kg/m   BP/Weight 07/14/2018 06/29/2018 1/61/0960  Systolic BP 454 - 098  Diastolic BP 81 - 82  Wt. (Lbs) 256.4 258 258  BMI 42.67 42.93 42.93      Physical Exam Constitutional:      Appearance: She is well-developed. She is obese.  Cardiovascular:     Rate and Rhythm: Normal rate.     Heart sounds: Normal heart sounds. No murmur.  Pulmonary:     Effort: Pulmonary effort is normal.     Breath sounds: Normal breath sounds. No wheezing or rales.  Chest:     Chest wall: No tenderness.  Abdominal:     General: Bowel sounds are normal. There is no distension.     Palpations: Abdomen is  soft. There is no mass.     Tenderness: There is no abdominal tenderness.  Musculoskeletal: Normal range of motion.     Right lower leg: Edema (1+ non pitting) present.     Left lower leg: Edema (1+ non pitting) present.  Neurological:     Mental Status: She is alert and oriented to person, place, and time.  Psychiatric:        Mood and Affect: Mood normal.     CMP Latest Ref Rng & Units 04/28/2018 02/18/2018 11/06/2017  Glucose 65 - 99 mg/dL 91 121(H) 115(H)  BUN 8 - 27 mg/dL 11 12 12   Creatinine 0.57 - 1.00 mg/dL  0.70 0.78 0.76  Sodium 134 - 144 mmol/L 137 144 144  Potassium 3.5 - 5.2 mmol/L 4.3 4.1 4.2  Chloride 96 - 106 mmol/L 102 102 99  CO2 20 - 29 mmol/L 25 28 27   Calcium 8.7 - 10.3 mg/dL 9.6 9.3 9.8  Total Protein 6.0 - 8.5 g/dL 6.9 - 6.9  Total Bilirubin 0.0 - 1.2 mg/dL 0.3 - 0.3  Alkaline Phos 39 - 117 IU/L 81 - 90  AST 0 - 40 IU/L 14 - 13  ALT 0 - 32 IU/L 14 - 16    Lipid Panel     Component Value Date/Time   CHOL 163 11/06/2017 1028   TRIG 74 11/06/2017 1028   HDL 54 11/06/2017 1028   CHOLHDL 3.0 11/06/2017 1028   CHOLHDL 3.1 11/08/2015 1210   VLDL 13 11/08/2015 1210   LDLCALC 94 11/06/2017 1028    CBC    Component Value Date/Time   WBC 6.9 02/02/2017 2242   RBC 4.22 02/02/2017 2242   HGB 12.7 02/02/2017 2242   HCT 39.3 02/02/2017 2242   PLT 306 02/02/2017 2242   MCV 93.1 02/02/2017 2242   MCH 30.1 02/02/2017 2242   MCHC 32.3 02/02/2017 2242   RDW 14.1 02/02/2017 2242   LYMPHSABS 1.8 04/15/2013 0958   MONOABS 0.5 04/15/2013 0958   EOSABS 0.4 04/15/2013 0958   BASOSABS 0.1 04/15/2013 0958    Lab Results  Component Value Date   HGBA1C 6.7 07/14/2018    Lab Results  Component Value Date   TSH 3.620 04/28/2018    The 10-year ASCVD risk score Mikey Bussing DC Jr., et al., 2013) is: 22.9%   Values used to calculate the score:     Age: 13 years     Sex: Female     Is Non-Hispanic African American: Yes     Diabetic: Yes     Tobacco smoker: No      Systolic Blood Pressure: 948 mmHg     Is BP treated: Yes     HDL Cholesterol: 54 mg/dL     Total Cholesterol: 163 mg/dL  Assessment & Plan:   1. Other gastritis without hemorrhage, unspecified chronicity We will need to evaluate for Helicobacter pylori No abdominal pain at this time - H. pylori breath test  2. Other specified hypothyroidism Controlled We will send of thyroid panel and adjust regimen accordingly - TSH - T4, free  3. Anxiety Could be secondary to recent bereavement Commence hydroxyzine to be used as needed  4. Essential hypertension Slightly elevated No regimen change today Continue antihypertensive Counseled on blood pressure goal of less than 130/80, low-sodium, DASH diet, medication compliance, 150 minutes of moderate intensity exercise per week. Discussed medication compliance, adverse effects. - Lipid panel  5. Type 2 diabetes mellitus with other specified complication, with long-term current use of insulin (HCC) Diet controlled with A1c of 6.7 ASCVD risk of 22.9%-needs to be on a statin -she declined in the past.  Will need to discuss this again at next visit Counseled on Diabetic diet, my plate method, 546 minutes of moderate intensity exercise/week Keep blood sugar logs with fasting goals of 80-120 mg/dl, random of less than 180 and in the event of sugars less than 60 mg/dl or greater than 400 mg/dl please notify the clinic ASAP. It is recommended that you undergo annual eye exams and annual foot exams. Pneumonia vaccine is recommended. -  Blood Glucose Monitoring Suppl (TRUE METRIX METER) DEVI; 1 each by Does not apply route daily  before breakfast.  Dispense: 1 Device; Refill: 0 POCT glucose (manual entry) - POCT glycosylated hemoglobin (Hb A1C) - Blood Glucose Monitoring Suppl (TRUE METRIX METER) DEVI; 1 each by Does not apply route daily before breakfast.  Dispense: 1 Device; Refill: 0 - TRUEplus Lancets 28G MISC; USE AS DIRECTED DAILY BEFORE  BREAKFAST.  Dispense: 30 each; Refill: 12 - glucose blood test strip; Use as instructed  Dispense: 100 each; Refill: 12  Advised to use OTC Voltaren gel for knee pain which could be secondary to osteoarthritis Meds ordered this encounter  Medications  . hydrOXYzine (ATARAX/VISTARIL) 25 MG tablet    Sig: Take 1 tablet (25 mg total) by mouth 2 (two) times daily as needed.    Dispense:  60 tablet    Refill:  1  . DISCONTD: TRUEplus Lancets 28G MISC    Sig: USE AS DIRECTED DAILY BEFORE BREAKFAST.    Dispense:  30 each    Refill:  12    Please dispense test strips and lancets  . DISCONTD: Blood Glucose Monitoring Suppl (TRUE METRIX METER) DEVI    Sig: 1 each by Does not apply route daily before breakfast.    Dispense:  1 Device    Refill:  0  . Blood Glucose Monitoring Suppl (TRUE METRIX METER) DEVI    Sig: 1 each by Does not apply route daily before breakfast.    Dispense:  1 Device    Refill:  0  . TRUEplus Lancets 28G MISC    Sig: USE AS DIRECTED DAILY BEFORE BREAKFAST.    Dispense:  30 each    Refill:  12    Please dispense test strips and lancets  . glucose blood test strip    Sig: Use as instructed    Dispense:  100 each    Refill:  12    Follow-up: Return in about 3 months (around 10/14/2018) for Follow-up of chronic medical conditions.       Charlott Rakes, MD, FAAFP. [facility] [place], [place]   07/14/2018, 2:23 PM

## 2018-07-14 NOTE — Patient Instructions (Signed)
Diabetes Mellitus and Nutrition, Adult  When you have diabetes (diabetes mellitus), it is very important to have healthy eating habits because your blood sugar (glucose) levels are greatly affected by what you eat and drink. Eating healthy foods in the appropriate amounts, at about the same times every day, can help you:  · Control your blood glucose.  · Lower your risk of heart disease.  · Improve your blood pressure.  · Reach or maintain a healthy weight.  Every person with diabetes is different, and each person has different needs for a meal plan. Your health care provider may recommend that you work with a diet and nutrition specialist (dietitian) to make a meal plan that is best for you. Your meal plan may vary depending on factors such as:  · The calories you need.  · The medicines you take.  · Your weight.  · Your blood glucose, blood pressure, and cholesterol levels.  · Your activity level.  · Other health conditions you have, such as heart or kidney disease.  How do carbohydrates affect me?  Carbohydrates, also called carbs, affect your blood glucose level more than any other type of food. Eating carbs naturally raises the amount of glucose in your blood. Carb counting is a method for keeping track of how many carbs you eat. Counting carbs is important to keep your blood glucose at a healthy level, especially if you use insulin or take certain oral diabetes medicines.  It is important to know how many carbs you can safely have in each meal. This is different for every person. Your dietitian can help you calculate how many carbs you should have at each meal and for each snack.  Foods that contain carbs include:  · Bread, cereal, rice, pasta, and crackers.  · Potatoes and corn.  · Peas, beans, and lentils.  · Milk and yogurt.  · Fruit and juice.  · Desserts, such as cakes, cookies, ice cream, and candy.  How does alcohol affect me?  Alcohol can cause a sudden decrease in blood glucose (hypoglycemia),  especially if you use insulin or take certain oral diabetes medicines. Hypoglycemia can be a life-threatening condition. Symptoms of hypoglycemia (sleepiness, dizziness, and confusion) are similar to symptoms of having too much alcohol.  If your health care provider says that alcohol is safe for you, follow these guidelines:  · Limit alcohol intake to no more than 1 drink per day for nonpregnant women and 2 drinks per day for men. One drink equals 12 oz of beer, 5 oz of wine, or 1½ oz of hard liquor.  · Do not drink on an empty stomach.  · Keep yourself hydrated with water, diet soda, or unsweetened iced tea.  · Keep in mind that regular soda, juice, and other mixers may contain a lot of sugar and must be counted as carbs.  What are tips for following this plan?    Reading food labels  · Start by checking the serving size on the "Nutrition Facts" label of packaged foods and drinks. The amount of calories, carbs, fats, and other nutrients listed on the label is based on one serving of the item. Many items contain more than one serving per package.  · Check the total grams (g) of carbs in one serving. You can calculate the number of servings of carbs in one serving by dividing the total carbs by 15. For example, if a food has 30 g of total carbs, it would be equal to 2   servings of carbs.  · Check the number of grams (g) of saturated and trans fats in one serving. Choose foods that have low or no amount of these fats.  · Check the number of milligrams (mg) of salt (sodium) in one serving. Most people should limit total sodium intake to less than 2,300 mg per day.  · Always check the nutrition information of foods labeled as "low-fat" or "nonfat". These foods may be higher in added sugar or refined carbs and should be avoided.  · Talk to your dietitian to identify your daily goals for nutrients listed on the label.  Shopping  · Avoid buying canned, premade, or processed foods. These foods tend to be high in fat, sodium,  and added sugar.  · Shop around the outside edge of the grocery store. This includes fresh fruits and vegetables, bulk grains, fresh meats, and fresh dairy.  Cooking  · Use low-heat cooking methods, such as baking, instead of high-heat cooking methods like deep frying.  · Cook using healthy oils, such as olive, canola, or sunflower oil.  · Avoid cooking with butter, cream, or high-fat meats.  Meal planning  · Eat meals and snacks regularly, preferably at the same times every day. Avoid going long periods of time without eating.  · Eat foods high in fiber, such as fresh fruits, vegetables, beans, and whole grains. Talk to your dietitian about how many servings of carbs you can eat at each meal.  · Eat 4-6 ounces (oz) of lean protein each day, such as lean meat, chicken, fish, eggs, or tofu. One oz of lean protein is equal to:  ? 1 oz of meat, chicken, or fish.  ? 1 egg.  ? ¼ cup of tofu.  · Eat some foods each day that contain healthy fats, such as avocado, nuts, seeds, and fish.  Lifestyle  · Check your blood glucose regularly.  · Exercise regularly as told by your health care provider. This may include:  ? 150 minutes of moderate-intensity or vigorous-intensity exercise each week. This could be brisk walking, biking, or water aerobics.  ? Stretching and doing strength exercises, such as yoga or weightlifting, at least 2 times a week.  · Take medicines as told by your health care provider.  · Do not use any products that contain nicotine or tobacco, such as cigarettes and e-cigarettes. If you need help quitting, ask your health care provider.  · Work with a counselor or diabetes educator to identify strategies to manage stress and any emotional and social challenges.  Questions to ask a health care provider  · Do I need to meet with a diabetes educator?  · Do I need to meet with a dietitian?  · What number can I call if I have questions?  · When are the best times to check my blood glucose?  Where to find more  information:  · American Diabetes Association: diabetes.org  · Academy of Nutrition and Dietetics: www.eatright.org  · National Institute of Diabetes and Digestive and Kidney Diseases (NIH): www.niddk.nih.gov  Summary  · A healthy meal plan will help you control your blood glucose and maintain a healthy lifestyle.  · Working with a diet and nutrition specialist (dietitian) can help you make a meal plan that is best for you.  · Keep in mind that carbohydrates (carbs) and alcohol have immediate effects on your blood glucose levels. It is important to count carbs and to use alcohol carefully.  This information is not intended to   replace advice given to you by your health care provider. Make sure you discuss any questions you have with your health care provider.  Document Released: 10/25/2004 Document Revised: 08/28/2016 Document Reviewed: 03/04/2016  Elsevier Interactive Patient Education © 2019 [employer]

## 2018-07-15 LAB — T4, FREE: Free T4: 1.65 ng/dL (ref 0.82–1.77)

## 2018-07-15 LAB — LIPID PANEL
Chol/HDL Ratio: 2.5 ratio (ref 0.0–4.4)
Cholesterol, Total: 138 mg/dL (ref 100–199)
HDL: 56 mg/dL (ref >39)
LDL Calculated: 72 mg/dL (ref 0–99)
Triglycerides: 49 mg/dL (ref 0–149)
VLDL Cholesterol Cal: 10 mg/dL (ref 5–40)

## 2018-07-15 LAB — H. PYLORI BREATH TEST: H pylori Breath Test: POSITIVE — AB

## 2018-07-15 LAB — TSH: TSH: 1.36 u[IU]/mL (ref 0.450–4.500)

## 2018-07-16 ENCOUNTER — Other Ambulatory Visit: Payer: Self-pay | Admitting: Family Medicine

## 2018-07-16 MED ORDER — OMEPRAZOLE 20 MG PO CPDR: 20.0000 mg | DELAYED_RELEASE_CAPSULE | Freq: Two times a day (BID) | ORAL | 0 refills | Status: DC

## 2018-07-16 MED ORDER — CLARITHROMYCIN 500 MG PO TABS: 500.0000 mg | ORAL_TABLET | Freq: Two times a day (BID) | ORAL | 0 refills | Status: DC

## 2018-07-16 MED ORDER — AMOXICILLIN 500 MG PO CAPS: 1000.0000 mg | ORAL_CAPSULE | Freq: Two times a day (BID) | ORAL | 0 refills | Status: DC

## 2018-07-16 MED FILL — ?OMEPRAZOLE 20 MG CAPSULE D: 20 | 14 days supply | Qty: 28 | Fill #0

## 2018-07-16 MED FILL — AMOXICILLIN 500 MG CAPSULE: 500 | 14 days supply | Qty: 56 | Fill #0

## 2018-07-16 MED FILL — CLARITHROMYCIN 500 MG TAB: 500 | 14 days supply | Qty: 28 | Fill #0

## 2018-07-23 ENCOUNTER — Other Ambulatory Visit: Payer: Self-pay

## 2018-07-23 ENCOUNTER — Ambulatory Visit: Payer: Self-pay | Attending: Family Medicine

## 2018-07-24 ENCOUNTER — Encounter: Payer: Self-pay | Admitting: Family Medicine

## 2018-08-05 ENCOUNTER — Encounter: Payer: Self-pay | Admitting: Family Medicine

## 2018-08-05 DIAGNOSIS — E119 Type 2 diabetes mellitus without complications: Secondary | ICD-10-CM

## 2018-08-05 NOTE — Telephone Encounter (Signed)
Please fill for patient 

## 2018-08-06 MED ORDER — RELION PRIME TEST VI STRP: ORAL_STRIP | 12 refills | Status: AC

## 2018-08-06 MED ORDER — RELION LANCETS MICRO-THIN 33G MISC: 12 refills | Status: DC

## 2018-08-06 MED ORDER — RELION PRIME MONITOR DEVI: 0 refills | Status: AC

## 2018-08-12 LAB — HM DIABETES EYE EXAM

## 2018-08-27 ENCOUNTER — Other Ambulatory Visit: Payer: Self-pay | Admitting: Cardiology

## 2018-08-27 ENCOUNTER — Other Ambulatory Visit (HOSPITAL_COMMUNITY): Payer: Self-pay | Admitting: Cardiology

## 2018-08-27 DIAGNOSIS — R079 Chest pain, unspecified: Secondary | ICD-10-CM

## 2018-09-09 ENCOUNTER — Encounter (HOSPITAL_COMMUNITY)
Admission: RE | Admit: 2018-09-09 | Discharge: 2018-09-09 | Disposition: A | Discharge status: Home / Self Care | Payer: Medicare Other | Source: Ambulatory Visit | Attending: Cardiology | Admitting: Cardiology

## 2018-09-09 ENCOUNTER — Other Ambulatory Visit: Payer: Self-pay

## 2018-09-09 DIAGNOSIS — R079 Chest pain, unspecified: Secondary | ICD-10-CM | POA: Diagnosis not present

## 2018-09-09 LAB — NM MYOCAR MULTI W/SPECT W/WALL MOTION / EF
Estimated workload: 1 METS
Exercise duration (min): 5 min
Exercise duration (sec): 16 s
MPHR: 150 {beats}/min
Peak HR: 107 {beats}/min
Percent HR: 71 %
Rest HR: 77 {beats}/min

## 2018-09-09 LAB — TSH: TSH: 0.313 u[IU]/mL — ABNORMAL LOW (ref 0.350–4.500)

## 2018-09-09 LAB — HEPATIC FUNCTION PANEL
ALT: 19 U/L (ref 0–44)
AST: 17 U/L (ref 15–41)
Albumin: 3.4 g/dL — ABNORMAL LOW (ref 3.5–5.0)
Alkaline Phosphatase: 73 U/L (ref 38–126)
Bilirubin, Direct: <0.1 mg/dL (ref 0.0–0.2)
Total Bilirubin: 0.7 mg/dL (ref 0.3–1.2)
Total Protein: 7.2 g/dL (ref 6.5–8.1)

## 2018-09-09 LAB — LIPID PANEL
Cholesterol: 186 mg/dL (ref 0–200)
HDL: 54 mg/dL (ref >40)
LDL Cholesterol: 122 mg/dL — ABNORMAL HIGH (ref 0–99)
Total CHOL/HDL Ratio: 3.4 RATIO
Triglycerides: 50 mg/dL (ref <150)
VLDL: 10 mg/dL (ref 0–40)

## 2018-09-09 LAB — BASIC METABOLIC PANEL
Anion gap: 9 (ref 5–15)
BUN: 9 mg/dL (ref 8–23)
CO2: 30 mmol/L (ref 22–32)
Calcium: 9.7 mg/dL (ref 8.9–10.3)
Chloride: 104 mmol/L (ref 98–111)
Creatinine, Ser: 0.68 mg/dL (ref 0.44–1.00)
GFR calc Af Amer: >60 mL/min (ref >60)
GFR calc non Af Amer: >60 mL/min (ref >60)
Glucose, Bld: 108 mg/dL — ABNORMAL HIGH (ref 70–99)
Potassium: 4 mmol/L (ref 3.5–5.1)
Sodium: 143 mmol/L (ref 135–145)

## 2018-09-09 LAB — HEMOGLOBIN A1C
Hgb A1c MFr Bld: 6.8 % — ABNORMAL HIGH (ref 4.8–5.6)
Mean Plasma Glucose: 148.46 mg/dL

## 2018-09-09 MED ORDER — TECHNETIUM TC 99M TETROFOSMIN IV KIT
10.0000 | PACK | Freq: Once | INTRAVENOUS | Status: AC | PRN
  Administered 2018-09-09: 10 via INTRAVENOUS

## 2018-09-09 MED ORDER — TECHNETIUM TC 99M TETROFOSMIN IV KIT
30.0000 | PACK | Freq: Once | INTRAVENOUS | Status: AC | PRN
  Administered 2018-09-09: 30 via INTRAVENOUS

## 2018-09-09 MED ORDER — REGADENOSON 0.4 MG/5ML IV SOLN
INTRAVENOUS | Status: AC
  Administered 2018-09-09: 0.4 mg via INTRAVENOUS
  Filled 2018-09-09: qty 5

## 2018-09-09 MED ORDER — REGADENOSON 0.4 MG/5ML IV SOLN
0.4000 mg | Freq: Once | INTRAVENOUS | Status: AC
  Administered 2018-09-09: 0.4 mg via INTRAVENOUS

## 2018-09-09 NOTE — Progress Notes (Signed)
    Stress test study reports of ST deviation and bigeminy entered erroneously under the wrong patient. I have contacted the cardiac tech for assistance and to make them aware. Dr. Terrence Dupont was present for this patients stress test and therefore Dr. Zenia Resides notes for Ms. Holsapple report should be used for further assessment. Attempted to correct documentation with fail.   Kathyrn Drown NP-C [place] Pager: [phone]

## 2018-09-25 ENCOUNTER — Encounter (HOSPITAL_COMMUNITY): Payer: Self-pay | Admitting: Emergency Medicine

## 2018-09-25 ENCOUNTER — Other Ambulatory Visit: Payer: Self-pay

## 2018-09-25 ENCOUNTER — Ambulatory Visit (HOSPITAL_COMMUNITY)
Admission: EM | Admit: 2018-09-25 | Discharge: 2018-09-25 | Disposition: A | Discharge status: Home / Self Care | Payer: Medicare Other | Attending: Internal Medicine | Admitting: Internal Medicine

## 2018-09-25 DIAGNOSIS — E119 Type 2 diabetes mellitus without complications: Secondary | ICD-10-CM | POA: Diagnosis present

## 2018-09-25 DIAGNOSIS — Z8619 Personal history of other infectious and parasitic diseases: Secondary | ICD-10-CM

## 2018-09-25 DIAGNOSIS — I10 Essential (primary) hypertension: Secondary | ICD-10-CM | POA: Insufficient documentation

## 2018-09-25 DIAGNOSIS — R030 Elevated blood-pressure reading, without diagnosis of hypertension: Secondary | ICD-10-CM | POA: Insufficient documentation

## 2018-09-25 DIAGNOSIS — R1030 Lower abdominal pain, unspecified: Secondary | ICD-10-CM | POA: Insufficient documentation

## 2018-09-25 DIAGNOSIS — R8290 Unspecified abnormal findings in urine: Secondary | ICD-10-CM | POA: Insufficient documentation

## 2018-09-25 LAB — POCT URINALYSIS DIP (DEVICE)
Bilirubin Urine: NEGATIVE
Glucose, UA: NEGATIVE mg/dL
Hgb urine dipstick: NEGATIVE
Ketones, ur: NEGATIVE mg/dL
Leukocytes,Ua: NEGATIVE
Nitrite: NEGATIVE
Protein, ur: NEGATIVE mg/dL
Specific Gravity, Urine: 1.025 (ref 1.005–1.030)
Urobilinogen, UA: 2 mg/dL — ABNORMAL HIGH (ref 0.0–1.0)
pH: 6 (ref 5.0–8.0)

## 2018-09-25 MED ORDER — CEPHALEXIN 500 MG PO CAPS: 500.0000 mg | ORAL_CAPSULE | Freq: Two times a day (BID) | ORAL | 0 refills | Status: DC

## 2018-09-25 NOTE — ED Triage Notes (Signed)
Pt states yesterday when she went the bathroom her urine had a strong smell. Denies any other symptoms.

## 2018-09-25 NOTE — ED Provider Notes (Signed)
MRN: [id number] DOB: [birth year]  Subjective:   Tiffany Strickland is a 63 y.o. female presenting for 1 day history of strong odor to her urine.  She insist on not having lower abdominal pain but describes an uncomfortable pressure type sensation in lower pelvic area.  Hydrates consistently with water. Has a hx of HTN, takes HCTZ for this and keeps close follow up with her PCP.  Her last a1c 6.8% on 09/09/2018.  No current facility-administered medications for this encounter.   Current Outpatient Medications:  .  aspirin (ASPIR-81) 81 MG EC tablet, Take 1 tablet (81 mg total) by mouth daily. Swallow whole., Disp: 30 tablet, Rfl: 12 .  Blood Glucose Monitoring Suppl (RELION PRIME MONITOR) DEVI, Use as directed once daily, Disp: 1 each, Rfl: 0 .  Blood Glucose Monitoring Suppl (TRUE METRIX METER) DEVI, 1 each by Does not apply route daily before breakfast., Disp: 1 Device, Rfl: 0 .  gabapentin (NEURONTIN) 300 MG capsule, Take 1 capsule (300 mg total) by mouth at bedtime., Disp: 60 capsule, Rfl: 6 .  glucose blood (RELION PRIME TEST) test strip, Use as instructed once daily, Disp: 100 each, Rfl: 12 .  hydrochlorothiazide (HYDRODIURIL) 25 MG tablet, Take 1 tablet (25 mg total) by mouth daily., Disp: 30 tablet, Rfl: 6 .  hydrOXYzine (ATARAX/VISTARIL) 25 MG tablet, Take 1 tablet (25 mg total) by mouth 2 (two) times daily as needed., Disp: 60 tablet, Rfl: 1 .  isosorbide mononitrate (IMDUR) 60 MG 24 hr tablet, Take 1 tablet (60 mg total) by mouth daily., Disp: 30 tablet, Rfl: 6 .  levothyroxine (SYNTHROID) 150 MCG tablet, TAKE 1 TABLET BY MOUTH DAILY BEFORE BREAKFAST., Disp: 30 tablet, Rfl: 3 .  losartan (COZAAR) 100 MG tablet, Take 1 tablet (100 mg total) by mouth daily., Disp: 30 tablet, Rfl: 6 .  metFORMIN (GLUCOPHAGE) 500 MG tablet, Take 1 tablet (500 mg total) by mouth 2 (two) times daily with a meal. (Patient not taking: Reported on 07/14/2018), Disp: 60 tablet, Rfl: 6 .  Multiple Vitamin (MULTIVITAMIN  WITH MINERALS) TABS tablet, Take 1 tablet by mouth daily., Disp: , Rfl:  .  Multiple Vitamins-Minerals (ECHINACEA ACZ PO), Take 1 tablet by mouth daily., Disp: , Rfl:  .  naproxen sodium (ALEVE) 220 MG tablet, Take 220 mg by mouth as needed (headache/pain)., Disp: , Rfl:  .  nitroGLYCERIN (NITROSTAT) 0.4 MG SL tablet, Place 1 tablet (0.4 mg total) under the tongue every 5 (five) minutes as needed for chest pain., Disp: 20 tablet, Rfl: 0 .  omeprazole (PRILOSEC) 20 MG capsule, Take 1 capsule (20 mg total) by mouth 2 (two) times daily before a meal., Disp: 28 capsule, Rfl: 0 .  ReliOn Lancets Micro-Thin 33G MISC, Use as instructed once daily, Disp: 100 each, Rfl: 12 .  TURMERIC PO, Take 1 tablet by mouth daily., Disp: , Rfl:     Allergies  Allergen Reactions  . Pollen Extract Other (See Comments)    Seasonal allergies  . Tomato Itching  . Wool Alcohol [Lanolin] Itching    Reaction unknown    Past Medical History:  Diagnosis Date  . Colon polyp    adenomatous  . Diabetes mellitus without complication ([place])   . Hyperlipidemia   . Hypertension   . Stroke ([place])   . Thyroid disease      Past Surgical History:  Procedure Laterality Date  . ABDOMINAL HYSTERECTOMY    . BLADDER SUSPENSION    . EUS N/A 08/12/2013   Procedure: LOWER ENDOSCOPIC  ULTRASOUND (EUS);  Surgeon: Milus Banister, MD;  Location: [facility];  Service: Endoscopy;  Laterality: N/A;  . KNEE SURGERY    . TUBAL LIGATION      Review of Systems  Constitutional: Negative for fever and malaise/fatigue.  HENT: Negative for congestion, ear pain, sinus pain and sore throat.   Eyes: Negative for blurred vision, double vision, discharge and redness.  Respiratory: Negative for cough, hemoptysis, shortness of breath and wheezing.   Cardiovascular: Negative for chest pain.  Gastrointestinal: Negative for blood in stool, constipation, diarrhea, nausea and vomiting.  Genitourinary: Negative for dysuria, flank pain, frequency,  hematuria and urgency.  Musculoskeletal: Negative for myalgias.  Skin: Negative for rash.  Neurological: Negative for dizziness, weakness and headaches.  Psychiatric/Behavioral: Negative for depression and substance abuse.    Objective:   Vitals: BP (!) 172/86   Pulse 76   Temp 98.3 F (36.8 C)   Resp 18   SpO2 100%   BP Readings from Last 3 Encounters:  09/25/18 (!) 172/86  09/09/18 (!) 149/71  07/14/18 (!) 148/81   BP was 153/71 on recheck by PA-Adalay Azucena.  Physical Exam Constitutional:      General: She is not in acute distress.    Appearance: Normal appearance. She is well-developed and normal weight. She is not ill-appearing, toxic-appearing or diaphoretic.  HENT:     Head: Normocephalic and atraumatic.     Right Ear: External ear normal.     Left Ear: External ear normal.     Nose: Nose normal.     Mouth/Throat:     Mouth: Mucous membranes are moist.     Pharynx: Oropharynx is clear.  Eyes:     General: No scleral icterus.    Extraocular Movements: Extraocular movements intact.     Pupils: Pupils are equal, round, and reactive to light.  Cardiovascular:     Rate and Rhythm: Normal rate and regular rhythm.     Heart sounds: Normal heart sounds. No murmur. No friction rub. No gallop.   Pulmonary:     Effort: Pulmonary effort is normal. No respiratory distress.     Breath sounds: Normal breath sounds. No stridor. No wheezing, rhonchi or rales.  Abdominal:     General: Bowel sounds are normal. There is no distension.     Palpations: Abdomen is soft. There is no mass.     Tenderness: There is no abdominal tenderness. There is no right CVA tenderness, left CVA tenderness, guarding or rebound.  Skin:    General: Skin is warm and dry.     Coloration: Skin is not pale.     Findings: No rash.  Neurological:     General: No focal deficit present.     Mental Status: She is alert and oriented to person, place, and time.  Psychiatric:        Mood and Affect: Mood normal.         Behavior: Behavior normal.        Thought Content: Thought content normal.        Judgment: Judgment normal.     Results for orders placed or performed during the hospital encounter of 09/25/18 (from the past 24 hour(s))  POCT urinalysis dip (device)     Status: Abnormal   Collection Time: 09/25/18  7:27 PM  Result Value Ref Range   Glucose, UA NEGATIVE NEGATIVE mg/dL   Bilirubin Urine NEGATIVE NEGATIVE   Ketones, ur NEGATIVE NEGATIVE mg/dL   Specific Gravity, Urine 1.025 1.005 -  1.030   Hgb urine dipstick NEGATIVE NEGATIVE   pH 6.0 5.0 - 8.0   Protein, ur NEGATIVE NEGATIVE mg/dL   Urobilinogen, UA 2.0 (H) 0.0 - 1.0 mg/dL   Nitrite NEGATIVE NEGATIVE   Leukocytes,Ua NEGATIVE NEGATIVE    Assessment and Plan :   1. Urine abnormality   2. Essential hypertension   3. Elevated blood pressure reading   4. Lower abdominal pain   5. History of Helicobacter pylori infection   6. Well controlled type 2 diabetes mellitus ([place])     Will cover cystitis with Keflex, urine culture pending.  Recommended patient maintain daily hydration. Counseled patient on potential for adverse effects with medications prescribed/recommended today, ER and return-to-clinic precautions discussed, patient verbalized understanding.    Jaynee Eagles, PA-C 09/26/18 1008

## 2018-09-26 LAB — URINE CULTURE: Culture: <10000 — AB

## 2018-10-06 ENCOUNTER — Encounter (HOSPITAL_COMMUNITY): Payer: Self-pay | Admitting: Emergency Medicine

## 2018-10-06 ENCOUNTER — Emergency Department (HOSPITAL_COMMUNITY)
Admission: EM | Admit: 2018-10-06 | Discharge: 2018-10-06 | Disposition: A | Discharge status: Home / Self Care | Payer: Medicare Other | Attending: Emergency Medicine | Admitting: Emergency Medicine

## 2018-10-06 ENCOUNTER — Emergency Department (HOSPITAL_COMMUNITY): Payer: Medicare Other

## 2018-10-06 ENCOUNTER — Other Ambulatory Visit: Payer: Self-pay

## 2018-10-06 DIAGNOSIS — I10 Essential (primary) hypertension: Secondary | ICD-10-CM | POA: Insufficient documentation

## 2018-10-06 DIAGNOSIS — R42 Dizziness and giddiness: Secondary | ICD-10-CM | POA: Diagnosis not present

## 2018-10-06 DIAGNOSIS — E039 Hypothyroidism, unspecified: Secondary | ICD-10-CM | POA: Insufficient documentation

## 2018-10-06 DIAGNOSIS — E860 Dehydration: Secondary | ICD-10-CM

## 2018-10-06 DIAGNOSIS — Z8673 Personal history of transient ischemic attack (TIA), and cerebral infarction without residual deficits: Secondary | ICD-10-CM | POA: Insufficient documentation

## 2018-10-06 DIAGNOSIS — E119 Type 2 diabetes mellitus without complications: Secondary | ICD-10-CM | POA: Diagnosis not present

## 2018-10-06 DIAGNOSIS — Z7984 Long term (current) use of oral hypoglycemic drugs: Secondary | ICD-10-CM | POA: Diagnosis not present

## 2018-10-06 DIAGNOSIS — Z79899 Other long term (current) drug therapy: Secondary | ICD-10-CM | POA: Diagnosis not present

## 2018-10-06 LAB — COMPREHENSIVE METABOLIC PANEL
ALT: 22 U/L (ref 0–44)
AST: 17 U/L (ref 15–41)
Albumin: 3.5 g/dL (ref 3.5–5.0)
Alkaline Phosphatase: 74 U/L (ref 38–126)
Anion gap: 10 (ref 5–15)
BUN: 24 mg/dL — ABNORMAL HIGH (ref 8–23)
CO2: 26 mmol/L (ref 22–32)
Calcium: 9.4 mg/dL (ref 8.9–10.3)
Chloride: 98 mmol/L (ref 98–111)
Creatinine, Ser: 0.95 mg/dL (ref 0.44–1.00)
GFR calc Af Amer: >60 mL/min (ref >60)
GFR calc non Af Amer: >60 mL/min (ref >60)
Glucose, Bld: 134 mg/dL — ABNORMAL HIGH (ref 70–99)
Potassium: 3.7 mmol/L (ref 3.5–5.1)
Sodium: 134 mmol/L — ABNORMAL LOW (ref 135–145)
Total Bilirubin: 0.7 mg/dL (ref 0.3–1.2)
Total Protein: 7 g/dL (ref 6.5–8.1)

## 2018-10-06 LAB — URINALYSIS, ROUTINE W REFLEX MICROSCOPIC
Bilirubin Urine: NEGATIVE
Glucose, UA: NEGATIVE mg/dL
Hgb urine dipstick: NEGATIVE
Ketones, ur: NEGATIVE mg/dL
Leukocytes,Ua: NEGATIVE
Nitrite: NEGATIVE
Protein, ur: NEGATIVE mg/dL
Specific Gravity, Urine: 1.006 (ref 1.005–1.030)
pH: 6 (ref 5.0–8.0)

## 2018-10-06 LAB — DIFFERENTIAL
Abs Immature Granulocytes: 0.02 10*3/uL (ref 0.00–0.07)
Basophils Absolute: 0 10*3/uL (ref 0.0–0.1)
Basophils Relative: 1 %
Eosinophils Absolute: 0.3 10*3/uL (ref 0.0–0.5)
Eosinophils Relative: 4 %
Immature Granulocytes: 0 %
Lymphocytes Relative: 24 %
Lymphs Abs: 1.7 10*3/uL (ref 0.7–4.0)
Monocytes Absolute: 0.7 10*3/uL (ref 0.1–1.0)
Monocytes Relative: 10 %
Neutro Abs: 4.4 10*3/uL (ref 1.7–7.7)
Neutrophils Relative %: 61 %

## 2018-10-06 LAB — CBC
HCT: 40.6 % (ref 36.0–46.0)
Hemoglobin: 12.7 g/dL (ref 12.0–15.0)
MCH: 28.9 pg (ref 26.0–34.0)
MCHC: 31.3 g/dL (ref 30.0–36.0)
MCV: 92.5 fL (ref 80.0–100.0)
Platelets: 304 10*3/uL (ref 150–400)
RBC: 4.39 MIL/uL (ref 3.87–5.11)
RDW: 13.9 % (ref 11.5–15.5)
WBC: 7.1 10*3/uL (ref 4.0–10.5)
nRBC: 0.3 % — ABNORMAL HIGH (ref 0.0–0.2)

## 2018-10-06 LAB — APTT: aPTT: 32 seconds (ref 24–36)

## 2018-10-06 LAB — PROTIME-INR
INR: 1 (ref 0.8–1.2)
Prothrombin Time: 13.1 seconds (ref 11.4–15.2)

## 2018-10-06 LAB — TROPONIN I (HIGH SENSITIVITY)
Troponin I (High Sensitivity): 4 ng/L (ref <18)
Troponin I (High Sensitivity): 5 ng/L (ref <18)

## 2018-10-06 MED ORDER — SODIUM CHLORIDE 0.9 % IV BOLUS
1000.0000 mL | Freq: Once | INTRAVENOUS | Status: AC
  Administered 2018-10-06: 1000 mL via INTRAVENOUS

## 2018-10-06 NOTE — ED Provider Notes (Signed)
[facility] EMERGENCY DEPARTMENT Provider Note   CSN: [id number] Arrival date & time: 10/06/18  0046     History   Chief Complaint Chief Complaint  Patient presents with   Dizziness    HPI Tiffany Strickland is a 63 y.o. female.     HPI 63 year old female presents with dizziness.  Started last night around 11 PM.  She was cleaning up in her room and when she stood up off the bed she became acutely dizzy and generally weak.  Felt like she might pass out.  Got better when she rested and she tried to drink fluids but was not all that better so she came in.  She has noticed while in the waiting room going to the bathroom she felt dizzy again when standing.  At rest she feels fine.  The most recently just before me seeing her she went to the bathroom and states she was standing up and feeling better with no recurrent dizziness.  She has drank more water and since drinking water here she thinks this is improved.  Perhaps a minimal headache but no severe headache.  She had some transient chest pressure after starting the work-up while in the waiting room but this was brief and gone.  No shortness of breath.  No palpitations.  Chronic lower extremity edema that is pretty much unchanged.  No recent illness.  She does note that she has overall poor sleep from helping her daughter with her baby.  Past Medical History:  Diagnosis Date   Colon polyp    adenomatous   Diabetes mellitus without complication ([place])    Hyperlipidemia    Hypertension    Stroke [facility])    Thyroid disease     Patient Active Problem List   Diagnosis Date Noted   Pulmonary HTN ([place]) 06/29/2018   Educated About Covid-19 Virus Infection 06/29/2018   OSA (obstructive sleep apnea) 02/03/2017   Hypothyroidism 02/02/2017   Chest pain 02/02/2017   Morbid obesity ([place]) 01/27/2017   Plantar fasciitis of left foot 11/08/2015   Non-insulin treated type 2 diabetes mellitus ([place]) 06/08/2015    Colon cancer screening 06/08/2015   Bilateral leg edema 12/15/2014   Blurry vision, bilateral 06/16/2014   Breast cancer screening 06/16/2014   Other specified hypothyroidism 06/16/2014   Other seasonal allergic rhinitis 06/16/2014   History of stroke 01/31/2014   Essential hypertension 08/24/2013   Nonspecific (abnormal) findings on radiological and other examination of gastrointestinal tract 08/12/2013   Lateral epicondylitis of right elbow 07/21/2013   Right shoulder pain 07/21/2013   Sprain of wrist, right 07/21/2013   Unspecified constipation 04/15/2013   Rectal bleeding 04/15/2013    Past Surgical History:  Procedure Laterality Date   ABDOMINAL HYSTERECTOMY     BLADDER SUSPENSION     EUS N/A 08/12/2013   Procedure: LOWER ENDOSCOPIC ULTRASOUND (EUS);  Surgeon: Milus Banister, MD;  Location: [facility];  Service: Endoscopy;  Laterality: N/A;   KNEE SURGERY     TUBAL LIGATION       OB History    Gravida  6   Para  5   Term  5   Preterm      AB  1   Living  5     SAB  1   TAB      Ectopic      Multiple      Live Births               Home  Medications    Prior to Admission medications   Medication Sig Start Date End Date Taking? Authorizing Provider  aspirin (ASPIR-81) 81 MG EC tablet Take 1 tablet (81 mg total) by mouth daily. Swallow whole. 11/08/15   Charlott Rakes, MD  Blood Glucose Monitoring Suppl (RELION PRIME MONITOR) DEVI Use as directed once daily 08/06/18   Charlott Rakes, MD  Blood Glucose Monitoring Suppl (TRUE METRIX METER) DEVI 1 each by Does not apply route daily before breakfast. 07/14/18   Charlott Rakes, MD  cephALEXin (KEFLEX) 500 MG capsule Take 1 capsule (500 mg total) by mouth 2 (two) times daily. 09/25/18   Jaynee Eagles, PA-C  gabapentin (NEURONTIN) 300 MG capsule Take 1 capsule (300 mg total) by mouth at bedtime. 07/07/18   Charlott Rakes, MD  glucose blood (RELION PRIME TEST) test strip Use as instructed once  daily 08/06/18   Charlott Rakes, MD  hydrochlorothiazide (HYDRODIURIL) 25 MG tablet Take 1 tablet (25 mg total) by mouth daily. 07/07/18   Charlott Rakes, MD  hydrOXYzine (ATARAX/VISTARIL) 25 MG tablet Take 1 tablet (25 mg total) by mouth 2 (two) times daily as needed. 07/14/18   Charlott Rakes, MD  isosorbide mononitrate (IMDUR) 60 MG 24 hr tablet Take 1 tablet (60 mg total) by mouth daily. 07/07/18 10/05/18  Charlott Rakes, MD  levothyroxine (SYNTHROID) 150 MCG tablet TAKE 1 TABLET BY MOUTH DAILY BEFORE BREAKFAST. 07/07/18   Charlott Rakes, MD  losartan (COZAAR) 100 MG tablet Take 1 tablet (100 mg total) by mouth daily. 07/07/18   Charlott Rakes, MD  metFORMIN (GLUCOPHAGE) 500 MG tablet Take 1 tablet (500 mg total) by mouth 2 (two) times daily with a meal. Patient not taking: Reported on 07/14/2018 07/07/18   Charlott Rakes, MD  Multiple Vitamin (MULTIVITAMIN WITH MINERALS) TABS tablet Take 1 tablet by mouth daily.    [provider]  Multiple Vitamins-Minerals (ECHINACEA ACZ PO) Take 1 tablet by mouth daily.    [provider]  naproxen sodium (ALEVE) 220 MG tablet Take 220 mg by mouth as needed (headache/pain).    [provider]  nitroGLYCERIN (NITROSTAT) 0.4 MG SL tablet Place 1 tablet (0.4 mg total) under the tongue every 5 (five) minutes as needed for chest pain. 07/07/18   Charlott Rakes, MD  omeprazole (PRILOSEC) 20 MG capsule Take 1 capsule (20 mg total) by mouth 2 (two) times daily before a meal. 07/16/18   Charlott Rakes, MD  ReliOn Lancets Micro-Thin 33G MISC Use as instructed once daily 08/06/18   Charlott Rakes, MD  TURMERIC PO Take 1 tablet by mouth daily.    [provider]    Family History Family History  Problem Relation Age of Onset   Hypertension Mother    Heart disease Maternal Grandmother    Cancer Maternal Grandmother        ovarian   Cancer Maternal Grandfather    Hypertension Other    Hyperlipidemia Other    Cancer Other      Sleep apnea Other    Obesity Other     Social History Social History   Tobacco Use   Smoking status: Never Smoker   Smokeless tobacco: Never Used  Substance Use Topics   Alcohol use: No   Drug use: No     Allergies   Pollen extract, Tomato, and Wool alcohol [lanolin]   Review of Systems Review of Systems  Constitutional: Negative for fever.  Eyes: Negative for visual disturbance.  Respiratory: Negative for shortness of breath.   Cardiovascular:  Positive for chest pain (gone now) and leg swelling (chronic).  Gastrointestinal: Negative for abdominal pain, diarrhea and vomiting.  Neurological: Positive for weakness and light-headedness. Negative for headaches.  All other systems reviewed and are negative.    Physical Exam Updated Vital Signs BP 140/70    Pulse (!) 55    Temp 98.1 F (36.7 C) (Oral)    Resp 17    SpO2 100%   Physical Exam Vitals signs and nursing note reviewed.  Constitutional:      Appearance: She is well-developed.  HENT:     Head: Normocephalic and atraumatic.     Right Ear: External ear normal.     Left Ear: External ear normal.     Nose: Nose normal.  Eyes:     General:        Right eye: No discharge.        Left eye: No discharge.     Extraocular Movements: Extraocular movements intact.     Right eye: No nystagmus.     Left eye: No nystagmus.     Pupils: Pupils are equal, round, and reactive to light.  Cardiovascular:     Rate and Rhythm: Normal rate and regular rhythm.     Heart sounds: Normal heart sounds.  Pulmonary:     Effort: Pulmonary effort is normal.     Breath sounds: Normal breath sounds.  Abdominal:     Palpations: Abdomen is soft.     Tenderness: There is no abdominal tenderness.  Musculoskeletal:     Comments: Mild non pitting swelling to bilateral lower legs/ankles  Skin:    General: Skin is warm and dry.  Neurological:     Mental Status: She is alert.     Comments: CN 3-12 grossly intact. 5/5 strength in  all 4 extremities. Grossly normal sensation. Normal finger to nose. Normal gait.  Psychiatric:        Mood and Affect: Mood is not anxious.      ED Treatments / Results  Labs (all labs ordered are listed, but only abnormal results are displayed) Labs Reviewed  CBC - Abnormal; Notable for the following components:      Result Value   nRBC 0.3 (*)    All other components within normal limits  COMPREHENSIVE METABOLIC PANEL - Abnormal; Notable for the following components:   Sodium 134 (*)    Glucose, Bld 134 (*)    BUN 24 (*)    All other components within normal limits  URINALYSIS, ROUTINE W REFLEX MICROSCOPIC - Abnormal; Notable for the following components:   Color, Urine STRAW (*)    All other components within normal limits  PROTIME-INR  APTT  DIFFERENTIAL  CBG MONITORING, ED  TROPONIN I (HIGH SENSITIVITY)  TROPONIN I (HIGH SENSITIVITY)    EKG EKG Interpretation  Date/Time:  Tuesday October 06 2018 01:00:49 EDT Ventricular Rate:  72 PR Interval:  172 QRS Duration: 82 QT Interval:  394 QTC Calculation: 431 R Axis:   62 Text Interpretation:  Normal sinus rhythm with sinus arrhythmia Possible Anterior infarct , age undetermined Abnormal ECG ST elevation inferiorly appears similar to multiple priors, most recently 2018 Confirmed by Sherwood Gambler [phone]) on 10/06/2018 9:34:52 AM   Radiology Ct Head Wo Contrast  Result Date: 10/06/2018 CLINICAL DATA:  63 year old female with dizziness. EXAM: CT HEAD WITHOUT CONTRAST TECHNIQUE: Contiguous axial images were obtained from the base of the skull through the vertex without intravenous contrast. COMPARISON:  Head CT dated 10/07/2009 FINDINGS: Brain:  The ventricles and sulci appropriate size for patient's age. The gray-white matter discrimination is preserved. There is no acute intracranial hemorrhage. No mass effect or midline shift. No extra-axial fluid collection. Vascular: No hyperdense vessel or unexpected calcification.  Skull: Normal. Negative for fracture or focal lesion. Sinuses/Orbits: Mild mucoperiosteal thickening of paranasal sinuses. Small right maxillary sinus retention cyst or polyp. The mastoid air cells are clear. Other: None IMPRESSION: Unremarkable noncontrast CT of the brain. Electronically Signed   By: Anner Crete M.D.   On: 10/06/2018 02:33    Procedures Procedures (including critical care time)  Medications Ordered in ED Medications  sodium chloride 0.9 % bolus 1,000 mL (1,000 mLs Intravenous New Bag/Given 10/06/18 1011)     Initial Impression / Assessment and Plan / ED Course  I have reviewed the triage vital signs and the nursing notes.  Pertinent labs & imaging results that were available during my care of the patient were reviewed by me and considered in my medical decision making (see chart for details).        Patient's dizziness is likely from orthostasis, may be some mild dehydration given the improvement with drinking water.  Given some IV fluids.  Labs are overall unremarkable besides minimal change in creatinine function and BUN elevation.  The chest pain she had in the waiting room sounds pretty nonspecific and my suspicion for PE, ACS, dissection is quite low.  She had a CT head done in triage though at this point CNS pathology seems pretty unlikely and I do not think MRI is needed.  She feels asymptomatic at this time and ambulated without difficulty for me.  Appears stable for discharge home with return precautions.  Final Clinical Impressions(s) / ED Diagnoses   Final diagnoses:  Lightheadedness  Dehydration    ED Discharge Orders    None       Sherwood Gambler, MD 10/06/18 [phone]

## 2018-10-06 NOTE — ED Notes (Signed)
Pt did not respond when called for vitals update

## 2018-10-06 NOTE — ED Notes (Signed)
ED Provider at bedside. 

## 2018-10-06 NOTE — ED Triage Notes (Signed)
C/O of new onset of dizziness starting to tonight. Pt states it occurs when she stands. Denies h/a or weakness or vision changes.

## 2018-10-14 ENCOUNTER — Ambulatory Visit: Payer: Self-pay | Admitting: Family Medicine

## 2018-11-09 ENCOUNTER — Other Ambulatory Visit: Payer: Self-pay | Admitting: Family Medicine

## 2018-11-09 DIAGNOSIS — E038 Other specified hypothyroidism: Secondary | ICD-10-CM

## 2018-11-17 ENCOUNTER — Other Ambulatory Visit: Payer: Self-pay

## 2018-11-17 ENCOUNTER — Encounter: Payer: Self-pay | Admitting: Podiatry

## 2018-11-17 ENCOUNTER — Ambulatory Visit (INDEPENDENT_AMBULATORY_CARE_PROVIDER_SITE_OTHER): Payer: Medicare Other | Admitting: Podiatry

## 2018-11-17 VITALS — BP 142/70 | HR 64

## 2018-11-17 DIAGNOSIS — L84 Corns and callosities: Secondary | ICD-10-CM

## 2018-11-17 DIAGNOSIS — M79674 Pain in right toe(s): Secondary | ICD-10-CM

## 2018-11-17 DIAGNOSIS — M792 Neuralgia and neuritis, unspecified: Secondary | ICD-10-CM

## 2018-11-17 DIAGNOSIS — M79675 Pain in left toe(s): Secondary | ICD-10-CM

## 2018-11-17 DIAGNOSIS — E119 Type 2 diabetes mellitus without complications: Secondary | ICD-10-CM

## 2018-11-17 DIAGNOSIS — B351 Tinea unguium: Secondary | ICD-10-CM

## 2018-11-17 NOTE — Patient Instructions (Signed)
Diabetes Mellitus and Foot Care Foot care is an important part of your health, especially when you have diabetes. Diabetes may cause you to have problems because of poor blood flow (circulation) to your feet and legs, which can cause your skin to:  Become thinner and drier.  Break more easily.  Heal more slowly.  Peel and crack. You may also have nerve damage (neuropathy) in your legs and feet, causing decreased feeling in them. This means that you may not notice minor injuries to your feet that could lead to more serious problems. Noticing and addressing any potential problems early is the best way to prevent future foot problems. How to care for your feet Foot hygiene  Wash your feet daily with warm water and mild soap. Do not use hot water. Then, pat your feet and the areas between your toes until they are completely dry. Do not soak your feet as this can dry your skin.  Trim your toenails straight across. Do not dig under them or around the cuticle. File the edges of your nails with an emery board or nail file.  Apply a moisturizing lotion or petroleum jelly to the skin on your feet and to dry, brittle toenails. Use lotion that does not contain alcohol and is unscented. Do not apply lotion between your toes. Shoes and socks  Wear clean socks or stockings every day. Make sure they are not too tight. Do not wear knee-high stockings since they may decrease blood flow to your legs.  Wear shoes that fit properly and have enough cushioning. Always look in your shoes before you put them on to be sure there are no objects inside.  To break in new shoes, wear them for just a few hours a day. This prevents injuries on your feet. Wounds, scrapes, corns, and calluses  Check your feet daily for blisters, cuts, bruises, sores, and redness. If you cannot see the bottom of your feet, use a mirror or ask someone for help.  Do not cut corns or calluses or try to remove them with medicine.  If you  find a minor scrape, cut, or break in the skin on your feet, keep it and the skin around it clean and dry. You may clean these areas with mild soap and water. Do not clean the area with peroxide, alcohol, or iodine.  If you have a wound, scrape, corn, or callus on your foot, look at it several times a day to make sure it is healing and not infected. Check for: ? Redness, swelling, or pain. ? Fluid or blood. ? Warmth. ? Pus or a bad smell. General instructions  Do not cross your legs. This may decrease blood flow to your feet.  Do not use heating pads or hot water bottles on your feet. They may burn your skin. If you have lost feeling in your feet or legs, you may not know this is happening until it is too late.  Protect your feet from hot and cold by wearing shoes, such as at the beach or on hot pavement.  Schedule a complete foot exam at least once a year (annually) or more often if you have foot problems. If you have foot problems, report any cuts, sores, or bruises to your health care provider immediately. Contact a health care provider if:  You have a medical condition that increases your risk of infection and you have any cuts, sores, or bruises on your feet.  You have an injury that is not   healing.  You have redness on your legs or feet.  You feel burning or tingling in your legs or feet.  You have pain or cramps in your legs and feet.  Your legs or feet are numb.  Your feet always feel cold.  You have pain around a toenail. Get help right away if:  You have a wound, scrape, corn, or callus on your foot and: ? You have pain, swelling, or redness that gets worse. ? You have fluid or blood coming from the wound, scrape, corn, or callus. ? Your wound, scrape, corn, or callus feels warm to the touch. ? You have pus or a bad smell coming from the wound, scrape, corn, or callus. ? You have a fever. ? You have a red line going up your leg. Summary  Check your feet every day  for cuts, sores, red spots, swelling, and blisters.  Moisturize feet and legs daily.  Wear shoes that fit properly and have enough cushioning.  If you have foot problems, report any cuts, sores, or bruises to your health care provider immediately.  Schedule a complete foot exam at least once a year (annually) or more often if you have foot problems. This information is not intended to replace advice given to you by your health care provider. Make sure you discuss any questions you have with your health care provider. Document Released: 01/26/2000 Document Revised: 03/12/2017 Document Reviewed: 03/01/2016 Elsevier Patient Education  2020 [employer]   Onychomycosis/Fungal Toenails  WHAT IS IT? An infection that lies within the keratin of your nail plate that is caused by a fungus.  WHY ME? Fungal infections affect all ages, sexes, races, and creeds.  There may be many factors that predispose you to a fungal infection such as age, coexisting medical conditions such as diabetes, or an autoimmune disease; stress, medications, fatigue, genetics, etc.  Bottom line: fungus thrives in a warm, moist environment and your shoes offer such a location.  IS IT CONTAGIOUS? Theoretically, yes.  You do not want to share shoes, nail clippers or files with someone who has fungal toenails.  Walking around barefoot in the same room or sleeping in the same bed is unlikely to transfer the organism.  It is important to realize, however, that fungus can spread easily from one nail to the next on the same foot.  HOW DO WE TREAT THIS?  There are several ways to treat this condition.  Treatment may depend on many factors such as age, medications, pregnancy, liver and kidney conditions, etc.  It is best to ask your doctor which options are available to you.  1. No treatment.   Unlike many other medical concerns, you can live with this condition.  However for many people this can be a painful condition and may lead to  ingrown toenails or a bacterial infection.  It is recommended that you keep the nails cut short to help reduce the amount of fungal nail. 2. Topical treatment.  These range from herbal remedies to prescription strength nail lacquers.  About 40-50% effective, topicals require twice daily application for approximately 9 to 12 months or until an entirely new nail has grown out.  The most effective topicals are medical grade medications available through physicians offices. 3. Oral antifungal medications.  With an 80-90% cure rate, the most common oral medication requires 3 to 4 months of therapy and stays in your system for a year as the new nail grows out.  Oral antifungal medications do require   blood work to make sure it is a safe drug for you.  A liver function panel will be performed prior to starting the medication and after the first month of treatment.  It is important to have the blood work performed to avoid any harmful side effects.  In general, this medication safe but blood work is required. 4. Laser Therapy.  This treatment is performed by applying a specialized laser to the affected nail plate.  This therapy is noninvasive, fast, and non-painful.  It is not covered by insurance and is therefore, out of pocket.  The results have been very good with a 80-95% cure rate.  [facility] is the only practice in the area to offer this therapy. 5. Permanent Nail Avulsion.  Removing the entire nail so that a new nail will not grow back. 

## 2018-11-22 NOTE — Progress Notes (Signed)
Subjective: Tiffany Strickland presents today  To re-establish care.   Her PCP is  Audley Hose, MD.  Patient relates 2-3 year history of diabetes.  Patient denies any history of foot wounds.  Patient does admit foot numbness and burning. She does take gabapentin for her symptoms.  Today, patient c/o of painful, discolored, thick toenails which interfere with daily activities.  Pain is aggravated when wearing enclosed shoe gear.   Past Medical History:  Diagnosis Date  . Colon polyp    adenomatous  . Diabetes mellitus without complication ([place])   . Hyperlipidemia   . Hypertension   . Stroke ([place])   . Thyroid disease     Patient Active Problem List   Diagnosis Date Noted  . Pulmonary HTN ([place]) 06/29/2018  . Educated about COVID-19 virus infection 06/29/2018  . OSA (obstructive sleep apnea) 02/03/2017  . Hypothyroidism 02/02/2017  . Chest pain 02/02/2017  . Morbid obesity ([place]) 01/27/2017  . Plantar fasciitis of left foot 11/08/2015  . Non-insulin treated type 2 diabetes mellitus ([place]) 06/08/2015  . Colon cancer screening 06/08/2015  . Bilateral leg edema 12/15/2014  . Blurry vision, bilateral 06/16/2014  . Breast cancer screening 06/16/2014  . Other specified hypothyroidism 06/16/2014  . Other seasonal allergic rhinitis 06/16/2014  . History of stroke 01/31/2014  . Rectal nodule 09/06/2013  . Essential hypertension 08/24/2013  . Nonspecific (abnormal) findings on radiological and other examination of gastrointestinal tract 08/12/2013  . Lateral epicondylitis of right elbow 07/21/2013  . Right shoulder pain 07/21/2013  . Sprain of wrist, right 07/21/2013  . Unspecified constipation 04/15/2013  . Rectal bleeding 04/15/2013    Past Surgical History:  Procedure Laterality Date  . ABDOMINAL HYSTERECTOMY    . BLADDER SUSPENSION    . EUS N/A 08/12/2013   Procedure: LOWER ENDOSCOPIC ULTRASOUND (EUS);  Surgeon: Milus Banister, MD;  Location: [facility];  Service:  Endoscopy;  Laterality: N/A;  . KNEE SURGERY    . TUBAL LIGATION      Current Outpatient Medications on File Prior to Visit  Medication Sig Dispense Refill  . aspirin (ASPIR-81) 81 MG EC tablet Take 1 tablet (81 mg total) by mouth daily. Swallow whole. 30 tablet 12  . Blood Glucose Monitoring Suppl (RELION PRIME MONITOR) DEVI Use as directed once daily 1 each 0  . Blood Glucose Monitoring Suppl (TRUE METRIX METER) DEVI 1 each by Does not apply route daily before breakfast. 1 Device 0  . cephALEXin (KEFLEX) 500 MG capsule Take 1 capsule (500 mg total) by mouth 2 (two) times daily. 14 capsule 0  . famotidine (PEPCID) 40 MG tablet Take 40 mg by mouth 2 (two) times daily.    Marland Kitchen gabapentin (NEURONTIN) 300 MG capsule Take 1 capsule (300 mg total) by mouth at bedtime. 60 capsule 6  . glucose blood (RELION PRIME TEST) test strip Use as instructed once daily 100 each 12  . hydrochlorothiazide (HYDRODIURIL) 25 MG tablet Take 1 tablet (25 mg total) by mouth daily. 30 tablet 6  . hydrOXYzine (ATARAX/VISTARIL) 25 MG tablet Take 1 tablet (25 mg total) by mouth 2 (two) times daily as needed. 60 tablet 1  . isosorbide mononitrate (IMDUR) 60 MG 24 hr tablet Take 1 tablet (60 mg total) by mouth daily. 30 tablet 6  . levothyroxine (EUTHYROX) 150 MCG tablet Take 1 tablet (150 mcg total) by mouth daily before breakfast. Must have office visit for refills. 30 tablet 0  . losartan (COZAAR) 100 MG tablet Take 1 tablet (  100 mg total) by mouth daily. 30 tablet 6  . metFORMIN (GLUCOPHAGE) 500 MG tablet Take 1 tablet (500 mg total) by mouth 2 (two) times daily with a meal. (Patient not taking: Reported on 07/14/2018) 60 tablet 6  . metroNIDAZOLE (FLAGYL) 500 MG tablet Take 500 mg by mouth 2 (two) times daily. for 10 days    . Multiple Vitamin (MULTIVITAMIN WITH MINERALS) TABS tablet Take 1 tablet by mouth daily.    . Multiple Vitamins-Minerals (ECHINACEA ACZ PO) Take 1 tablet by mouth daily.    . naproxen sodium (ALEVE) 220  MG tablet Take 220 mg by mouth as needed (headache/pain).    . nitroGLYCERIN (NITROSTAT) 0.4 MG SL tablet Place 1 tablet (0.4 mg total) under the tongue every 5 (five) minutes as needed for chest pain. 20 tablet 0  . omeprazole (PRILOSEC) 20 MG capsule Take 1 capsule (20 mg total) by mouth 2 (two) times daily before a meal. 28 capsule 0  . omeprazole (PRILOSEC) 40 MG capsule TAKE 1 CAPSULE BY MOUTH ONCE DAILY FOR 90 DAYS    . ReliOn Lancets Micro-Thin 33G MISC Use as instructed once daily 100 each 12  . rosuvastatin (CRESTOR) 10 MG tablet Take 10 mg by mouth every evening.    Marland Kitchen tetracycline (SUMYCIN) 500 MG capsule TAKE 1 CAPSULE BY MOUTH EVERY 12 HOURS FOR 10 DAYS    . TURMERIC PO Take 1 tablet by mouth daily.     No current facility-administered medications on file prior to visit.      Allergies  Allergen Reactions  . Pollen Extract Other (See Comments)    Seasonal allergies  . Tomato Itching  . Wool Alcohol [Lanolin] Itching    Reaction unknown    Social History   Occupational History  . Not on file  Tobacco Use  . Smoking status: Never Smoker  . Smokeless tobacco: Never Used  Substance and Sexual Activity  . Alcohol use: No  . Drug use: No  . Sexual activity: Yes    Birth control/protection: Surgical    Family History  Problem Relation Age of Onset  . Hypertension Mother   . Heart disease Maternal Grandmother   . Cancer Maternal Grandmother        ovarian  . Cancer Maternal Grandfather   . Hypertension Other   . Hyperlipidemia Other   . Cancer Other   . Sleep apnea Other   . Obesity Other     Immunization History  Administered Date(s) Administered  . Influenza Split 12/11/2012  . Influenza,inj,Quad PF,6+ Mos 12/11/2012, 01/31/2014, 11/08/2015, 01/27/2017, 11/06/2017  . Pneumococcal Polysaccharide-23 01/27/2017    Review of systems: Positive Findings in bold print.  Constitutional:  chills, fatigue, fever, sweats, weight change Communication: Optometrist,  sign Ecologist, hand writing, iPad/Android device Head: headaches, head injury Eyes: changes in vision, eye pain, glaucoma, cataracts, macular degeneration, diplopia, glare,  light sensitivity, eyeglasses or contacts, blindness Ears nose mouth throat: hearing impaired, hearing aids,  ringing in ears, deaf, sign language,  vertigo, nosebleeds,  rhinitis,  cold sores, snoring, swollen glands Cardiovascular: HTN, edema, arrhythmia, pacemaker in place, defibrillator in place, chest pain/tightness, chronic anticoagulation, blood clot, heart failure, MI Peripheral Vascular: leg cramps, varicose veins, blood clots, lymphedema, varicosities Respiratory:  difficulty breathing, denies congestion, SOB, wheezing, cough, emphysema Gastrointestinal: change in appetite or weight, abdominal pain, constipation, diarrhea, nausea, vomiting, vomiting blood, change in bowel habits, abdominal pain, jaundice, rectal bleeding, hemorrhoids, GERD Genitourinary:  nocturia,  pain on urination, polyuria,  blood  in urine, Foley catheter, urinary urgency, ESRD on hemodialysis Musculoskeletal: amputation, cramping, stiff joints, painful joints, decreased joint motion, fractures, OA, gout, hemiplegia, paraplegia, uses cane, wheelchair bound, uses walker, uses rollator Skin: +changes in toenails, color change, dryness, itching, mole changes,  rash, wound(s) Neurological: headaches, numbness in feet, paresthesias in feet, burning in feet, fainting,  seizures, change in speech,  headaches, memory problems/poor historian, cerebral palsy, weakness, paralysis, CVA, TIA Endocrine: diabetes, hypothyroidism, hyperthyroidism,  goiter, dry mouth, flushing, heat intolerance,  cold intolerance,  excessive thirst, denies polyuria,  nocturia Hematological:  easy bleeding, excessive bleeding, easy bruising, enlarged lymph nodes, on long term blood thinner, history of past transusions Allergy/immunological:  hives, eczema, frequent  infections, multiple drug allergies, seasonal allergies, transplant recipient, multiple food allergies Psychiatric:  anxiety, depression, mood disorder, suicidal ideations, hallucinations, insomnia  Objective: Vitals:   11/17/18 1100  BP: (!) 142/70  Pulse: 64   Vascular Examination: Capillary refill time <3 seconds x 10 digits.  Dorsalis pedis pulses 2/4 b/l.  Posterior tibial pulses 2/4 b/l.  Digital hair sparse x 10 digits.  Skin temperature gradient WNL b/l.  +Varicosities/edema b/l ankles. No open wounds. No warmth. No flocculence. No weeping.   Dermatological Examination: Skin with normal turgor, texture and tone b/l.  Toenails 1-5 b/l discolored, thick, dystrophic with subungual debris and pain with palpation to nailbeds due to thickness of nails.  Hyperkeratotic lesions b/l hallux with tenderness to palpation. No edema, no erythema, no drainage, no flocculence.  Musculoskeletal: Muscle strength 5/5 to all LE muscle groups b/l.  HAV with bunion b/l.   Neurological: Sensation intact 5/5 b/l  with 10 gram monofilament.  Vibratory sensation intact b/l.  Assessment: 1. Painful onychomycosis toenails 1-5 b/l  2. Calluses b/l hallux 3. HAV with bunion b/l 4. NIDDM  Plan: 1. Discussed diabetic foot care principles. Literature dispensed on today. 2. Toenails 1-5 b/l were debrided in length and girth without iatrogenic bleeding.  3. Calluses pared b/l hallux utilizing sterile scalpel blade without incident. 4. Patient to continue soft, supportive shoe gear. Patient qualifies for diabetic shoes based on examination today. Per Medicare guidelines, patient's feet need to be evaluated by an MD/DO managing patient's diabetes and diabetic shoe certification form needs to be signed by the MD/DO. If patient's diabetes is being managed by an Endocrinologist, the Endocrinologist must evaluate patient's feet and sign the Medicare diabetic shoe certification form. 5. Patient to  report any pedal injuries to medical professional immediately. 6. Follow up 3 months.  7. Patient/POA to call should there be a concern in the interim.

## 2018-11-26 ENCOUNTER — Other Ambulatory Visit: Payer: Self-pay | Admitting: Internal Medicine

## 2018-11-26 DIAGNOSIS — Z1231 Encounter for screening mammogram for malignant neoplasm of breast: Secondary | ICD-10-CM

## 2018-11-27 ENCOUNTER — Other Ambulatory Visit: Payer: Self-pay | Admitting: Internal Medicine

## 2018-11-27 DIAGNOSIS — E2839 Other primary ovarian failure: Secondary | ICD-10-CM

## 2018-12-07 ENCOUNTER — Ambulatory Visit (INDEPENDENT_AMBULATORY_CARE_PROVIDER_SITE_OTHER): Payer: Self-pay | Admitting: Podiatry

## 2018-12-07 ENCOUNTER — Other Ambulatory Visit: Payer: Self-pay

## 2018-12-07 DIAGNOSIS — M79674 Pain in right toe(s): Secondary | ICD-10-CM

## 2018-12-07 DIAGNOSIS — L600 Ingrowing nail: Secondary | ICD-10-CM

## 2018-12-07 NOTE — Patient Instructions (Addendum)
EPSOM SALT FOOT SOAK INSTRUCTIONS  1.  Place 1/4 cup of epsom salts in 2 quarts of warm tap water. IF YOU ARE DIABETIC, OR HAVE NEUROPATHY,  CHECK THE TEMPERATURE OF THE WATER WITH YOUR ELBOW.  2.  Submerge your foot/feet in the solution and soak for 10 to 15  minutes.      3.  Next, remove your foot or feet from solution, blot dry the affected area.    4.  Apply antibiotic ointment and cover with fabric band-aid .  5.  This soak should be done once a day for 7 days.   6.  Monitor for any signs/symptoms of infection such as redness, swelling, odor, drainage, increased pain, or non-healing of digit.   7.  Please do not hesitate to call the office and speak to a Nurse or Doctor if you have questions.   8.  If you experience fever, chills, nightsweats, nausea or vomiting with worsening of digit, please go to the emergency room.

## 2018-12-10 ENCOUNTER — Encounter: Payer: Self-pay | Admitting: Podiatry

## 2018-12-10 NOTE — Progress Notes (Signed)
Subjective:  Tiffany Strickland presents to clinic today with continued pain of right great toe. She had toenails debrided about 3 weeks ago, but she has continued to experience discomfort in her right great toe. She denies any redness, drainage or swelling.  Audley Hose, MD is her PCP.   Current Outpatient Medications on File Prior to Visit  Medication Sig Dispense Refill  . aspirin (ASPIR-81) 81 MG EC tablet Take 1 tablet (81 mg total) by mouth daily. Swallow whole. 30 tablet 12  . Blood Glucose Monitoring Suppl (RELION PRIME MONITOR) DEVI Use as directed once daily 1 each 0  . Blood Glucose Monitoring Suppl (TRUE METRIX METER) DEVI 1 each by Does not apply route daily before breakfast. 1 Device 0  . cephALEXin (KEFLEX) 500 MG capsule Take 1 capsule (500 mg total) by mouth 2 (two) times daily. 14 capsule 0  . famotidine (PEPCID) 40 MG tablet Take 40 mg by mouth 2 (two) times daily.    Marland Kitchen gabapentin (NEURONTIN) 300 MG capsule Take 1 capsule (300 mg total) by mouth at bedtime. 60 capsule 6  . glucose blood (RELION PRIME TEST) test strip Use as instructed once daily 100 each 12  . hydrochlorothiazide (HYDRODIURIL) 25 MG tablet Take 1 tablet (25 mg total) by mouth daily. 30 tablet 6  . hydrOXYzine (ATARAX/VISTARIL) 25 MG tablet Take 1 tablet (25 mg total) by mouth 2 (two) times daily as needed. 60 tablet 1  . isosorbide mononitrate (IMDUR) 60 MG 24 hr tablet Take 1 tablet (60 mg total) by mouth daily. 30 tablet 6  . levothyroxine (EUTHYROX) 150 MCG tablet Take 1 tablet (150 mcg total) by mouth daily before breakfast. Must have office visit for refills. 30 tablet 0  . losartan (COZAAR) 100 MG tablet Take 1 tablet (100 mg total) by mouth daily. 30 tablet 6  . metFORMIN (GLUCOPHAGE) 500 MG tablet Take 1 tablet (500 mg total) by mouth 2 (two) times daily with a meal. (Patient not taking: Reported on 07/14/2018) 60 tablet 6  . metroNIDAZOLE (FLAGYL) 500 MG tablet Take 500 mg by mouth 2 (two) times  daily. for 10 days    . Multiple Vitamin (MULTIVITAMIN WITH MINERALS) TABS tablet Take 1 tablet by mouth daily.    . Multiple Vitamins-Minerals (ECHINACEA ACZ PO) Take 1 tablet by mouth daily.    . naproxen sodium (ALEVE) 220 MG tablet Take 220 mg by mouth as needed (headache/pain).    . nitroGLYCERIN (NITROSTAT) 0.4 MG SL tablet Place 1 tablet (0.4 mg total) under the tongue every 5 (five) minutes as needed for chest pain. 20 tablet 0  . omeprazole (PRILOSEC) 20 MG capsule Take 1 capsule (20 mg total) by mouth 2 (two) times daily before a meal. 28 capsule 0  . omeprazole (PRILOSEC) 40 MG capsule TAKE 1 CAPSULE BY MOUTH ONCE DAILY FOR 90 DAYS    . ReliOn Lancets Micro-Thin 33G MISC Use as instructed once daily 100 each 12  . rosuvastatin (CRESTOR) 10 MG tablet Take 10 mg by mouth every evening.    Marland Kitchen tetracycline (SUMYCIN) 500 MG capsule TAKE 1 CAPSULE BY MOUTH EVERY 12 HOURS FOR 10 DAYS    . TURMERIC PO Take 1 tablet by mouth daily.     No current facility-administered medications on file prior to visit.      Allergies  Allergen Reactions  . Pollen Extract Other (See Comments)    Seasonal allergies  . Tomato Itching  . Wool Alcohol [Lanolin] Itching  Reaction unknown     Objective: There were no vitals filed for this visit.  Physical Examination:  Vascular Examination: Capillary refill time <3 seconds x 10 digits.   Palpable DP/PT pulses b/l.  Digital hair sparse b/l.   No edema noted b/l.  Skin temperature gradient WNL b/l.  +Varicosities/edema b/l ankles.  Dermatological Examination: Skin with normal turgor, texture and tone b/l.  No open wounds b/l.  No interdigital macerations noted b/l.  Toenails 1-5 b/l show evidence of recent debridement.   Right great toe with hyperkeratotic border medially. Retained incurvated nailplate noted with tenderness to palpation. No edema, no erythema, no drainage nor flocculence.  Musculoskeletal Examination: Muscle strength  5/5 to all muscle groups b/l.  HAV with bunion b/l.  No pain, crepitus or joint discomfort with active/passive ROM.  Neurological Examination: Sensation intact 5/5 b/l with 10 gram monofilament.  Vibratory sensation intact b/l.  Proprioceptive sensation intact b/l.  Assessment: Painful right great toe with ingrown nail, noninfected  Plan: Offending nail border debrided and curretaged right hallux. Border cleansed with alcohol and triple antibiotic applied. No further treatment required by patient/caregiver.Patient given written instructions for epsom salt soaks once daily for one week. Call office if condition does not resolve. 2.  Continue soft, supportive shoe gear daily. 3.  Report any pedal injuries to medical professional. 4.  Follow up 3 months. 5.  Patient/POA to call should there be a question/concern in there interim.

## 2018-12-22 ENCOUNTER — Ambulatory Visit: Payer: Medicare Other | Admitting: Obstetrics and Gynecology

## 2019-01-04 ENCOUNTER — Other Ambulatory Visit: Payer: Self-pay

## 2019-01-04 ENCOUNTER — Ambulatory Visit (INDEPENDENT_AMBULATORY_CARE_PROVIDER_SITE_OTHER): Payer: Medicare Other | Admitting: Podiatry

## 2019-01-04 ENCOUNTER — Encounter: Payer: Self-pay | Admitting: Podiatry

## 2019-01-04 DIAGNOSIS — L600 Ingrowing nail: Secondary | ICD-10-CM | POA: Diagnosis not present

## 2019-01-04 MED ORDER — CEPHALEXIN 500 MG PO CAPS: 500.0000 mg | ORAL_CAPSULE | Freq: Three times a day (TID) | ORAL | 0 refills | Status: AC

## 2019-01-04 NOTE — Patient Instructions (Addendum)
EPSOM SALT FOOT SOAK INSTRUCTIONS  1.  Place 1/4 cup of epsom salts in 2 quarts of warm tap water. IF YOU ARE DIABETIC, OR HAVE NEUROPATHY,  CHECK THE TEMPERATURE OF THE WATER WITH YOUR ELBOW.  2.  Submerge your foot/feet in the solution and soak for 10-15 minutes.      3.  Next, remove your foot or feet from solution, blot dry the affected area.    4.  Apply antibiotic ointment and cover with fabric band-aid .  5.  This soak should be done once a day for 10 days.   6.  Monitor for any signs/symptoms of infection such as redness, swelling, odor, drainage, increased pain, or non-healing of digit.   7.  Please do not hesitate to call the office and speak to a Nurse or Doctor if you have questions.   8.  If you experience fever, chills, nightsweats, nausea or vomiting with worsening of digit, please go to the emergency room.    Diabetic Neuropathy Diabetic neuropathy refers to nerve damage that is caused by diabetes (diabetes mellitus). Over time, people with diabetes can develop nerve damage throughout the body. There are several types of diabetic neuropathy:  Peripheral neuropathy. This is the most common type of diabetic neuropathy. It causes damage to nerves that carry signals between the spinal cord and other parts of the body (peripheral nerves). This usually affects nerves in the feet and legs first, and may eventually affect the hands and arms. The damage affects the ability to sense touch or temperature.  Autonomic neuropathy. This type causes damage to nerves that control involuntary functions (autonomic nerves). These nerves carry signals that control: ? Heartbeat. ? Body temperature. ? Blood pressure. ? Urination. ? Digestion. ? Sweating. ? Sexual function. ? Response to changing blood sugar (glucose) levels.  Focal neuropathy. This type of nerve damage affects one area of the body, such as an arm, a leg, or the face. The injury may involve one nerve or a small group of  nerves. Focal neuropathy can be painful and unpredictable, and occurs most often in older adults with diabetes. This often develops suddenly, but usually improves over time and does not cause long-term problems.  Proximal neuropathy. This type of nerve damage affects the nerves of the thighs, hips, buttocks, or legs. It causes severe pain, weakness, and muscle death (atrophy), usually in the thigh muscles. It is more common among older men and people who have type 2 diabetes. The length of recovery time may vary. What are the causes? Peripheral, autonomic, and focal neuropathies are caused by diabetes that is not well controlled with treatment. The cause of proximal neuropathy is not known, but it may be caused by inflammation related to uncontrolled blood glucose levels. What are the signs or symptoms? Peripheral neuropathy Peripheral neuropathy develops slowly over time. When the nerves of the feet and legs no longer work, you may experience:  Burning, stabbing, or aching pain in the legs or feet.  Pain or cramping in the legs or feet.  Loss of feeling (numbness) and inability to feel pressure or pain in the feet. This can lead to: ? Thick calluses or sores on areas of constant pressure. ? Ulcers. ? Reduced ability to feel temperature changes.  Foot deformities.  Muscle weakness.  Loss of balance or coordination. Autonomic neuropathy The symptoms of autonomic neuropathy vary depending on which nerves are affected. Symptoms may include:  Problems with digestion, such as: ? Nausea or vomiting. ? Poor appetite. ?  Bloating. ? Diarrhea or constipation. ? Trouble swallowing. ? Losing weight without trying to.  Problems with the heart, blood and lungs, such as: ? Dizziness, especially when standing up. ? Fainting. ? Shortness of breath. ? Irregular heartbeat.  Bladder problems, such as: ? Trouble starting or stopping urination. ? Leaking urine. ? Trouble emptying the bladder. ?  Urinary tract infections (UTIs).  Problems with other body functions, such as: ? Sweat. You may sweat too much or too little. ? Temperature. You might get hot easily. Or, you might feel cold more than usual. ? Sexual function. Men may not be able to get or maintain an erection. Women may have vaginal dryness and difficulty with arousal. Focal neuropathy Symptoms affect only one area of the body. Common symptoms include:  Numbness.  Tingling.  Burning pain.  Prickling feeling.  Very sensitive skin.  Weakness.  Inability to move (paralysis).  Muscle twitching.  Muscles getting smaller (wasting).  Poor coordination.  Double or blurred vision. Proximal neuropathy  Sudden, severe pain in the hip, thigh, or buttocks. Pain may spread from the back into the legs (sciatica).  Pain and numbness in the arms and legs.  Tingling.  Loss of bladder control or bowel control.  Weakness and wasting of thigh muscles.  Difficulty getting up from a seated position.  Abdominal swelling.  Unexplained weight loss. How is this diagnosed? Diagnosis usually involves reviewing your medical history and any symptoms you have. Diagnosis varies depending on the type of neuropathy your health care provider suspects. Peripheral neuropathy Your health care provider will check areas that are affected by your nervous system (neurologic exam), such as your reflexes, how you move, and what you can feel. You may have other tests, such as:  Blood tests.  Removal and examination of fluid that surrounds the spinal cord (lumbar puncture).  CT scan.  MRI.  A test to check the nerves that control muscles (electromyogram, EMG).  Tests of how quickly messages pass through your nerves (nerve conduction velocity tests).  Removal of a small piece of nerve to be examined under a microscope (biopsy). Autonomic neuropathy You may have tests, such as:  Tests to measure your blood pressure and heart  rate. This may include monitoring you while you are safely secured to an exam table that moves you from a lying position to an upright position (table tilt test).  Breathing tests to check your lungs.  Tests to check how food moves through the digestive system (gastric emptying tests).  Blood, sweat, or urine tests.  Ultrasound of your bladder.  Spinal fluid tests. Focal neuropathy This condition may be diagnosed with:  A neurologic exam.  CT scan.  MRI.  EMG.  Nerve conduction velocity tests. Proximal neuropathy There is no test to diagnose this type of neuropathy. You may have tests to rule out other possible causes of this type of neuropathy. Tests may include:  X-rays of your spine and lumbar region.  Lumbar puncture.  MRI. How is this treated? The goal of treatment is to keep nerve damage from getting worse. The most important part of treatment is keeping your blood glucose level and your A1C level within your target range by following your diabetes management plan. Over time, maintaining lower blood glucose levels helps lessen symptoms. In some cases, you may need prescription pain medicine. Follow these instructions at home:  Lifestyle   Do not use any products that contain nicotine or tobacco, such as cigarettes and e-cigarettes. If you need help  quitting, ask your health care provider.  Be physically active every day. Include strength training and balance exercises.  Follow a healthy meal plan.  Work with your health care provider to manage your blood pressure. General instructions  Follow your diabetes management plan as directed. ? Check your blood glucose levels as directed by your health care provider. ? Keep your blood glucose in your target range as directed by your health care provider. ? Have your A1C level checked at least two times a year, or as often as told by your health care provider.  Take over the counter and prescription medicines only as  told by your health care provider. This includes insulin and diabetes medicine.  Do not drive or use heavy machinery while taking prescription pain medicines.  Check your skin and feet every day for cuts, bruises, redness, blisters, or sores.  Keep all follow up visits as told by your health care provider. This is important. Contact a health care provider if:  You have burning, stabbing, or aching pain in your legs or feet.  You are unable to feel pressure or pain in your feet.  You develop problems with digestion, such as: ? Nausea. ? Vomiting. ? Bloating. ? Constipation. ? Diarrhea. ? Abdominal pain.  You have difficulty with urination, such as inability: ? To control when you urinate (incontinence). ? To completely empty the bladder (retention).  You have palpitations.  You feel dizzy, weak, or faint when you stand up. Get help right away if:  You cannot urinate.  You have sudden weakness or loss of coordination.  You have trouble speaking.  You have pain or pressure in your chest.  You have an irregular heart beat.  You have sudden inability to move a part of your body. Summary  Diabetic neuropathy refers to nerve damage that is caused by diabetes. It can affect nerves throughout the entire body, causing numbness and pain in the arms, legs, digestive tract, heart, and other body systems.  Keep your blood glucose level and your blood pressure in your target range, as directed by your health care provider. This can help prevent neuropathy from getting worse.  Check your skin and feet every day for cuts, bruises, redness, blisters, or sores.  Do not use any products that contain nicotine or tobacco, such as cigarettes and e-cigarettes. If you need help quitting, ask your health care provider. This information is not intended to replace advice given to you by your health care provider. Make sure you discuss any questions you have with your health care provider.  Document Released: 04/08/2001 Document Revised: 03/12/2017 Document Reviewed: 03/04/2016 Elsevier Patient Education  2020 [employer].

## 2019-01-09 NOTE — Progress Notes (Signed)
Subjective:  Tiffany Strickland presents to clinic today with cc of painful, ingrown toenail of right great toe medial border.   Pain has been present for 1 week. She states she did fine for about 3 weeks and toe started hurting again.   Attempted treatments include: slantback procedure right hallux medial border.  Current Outpatient Medications on File Prior to Visit  Medication Sig Dispense Refill  . aspirin (ASPIR-81) 81 MG EC tablet Take 1 tablet (81 mg total) by mouth daily. Swallow whole. 30 tablet 12  . Blood Glucose Monitoring Suppl (RELION PRIME MONITOR) DEVI Use as directed once daily 1 each 0  . Blood Glucose Monitoring Suppl (TRUE METRIX METER) DEVI 1 each by Does not apply route daily before breakfast. 1 Device 0  . cephALEXin (KEFLEX) 500 MG capsule Take 1 capsule (500 mg total) by mouth 2 (two) times daily. (Patient not taking: Reported on 01/04/2019) 14 capsule 0  . famotidine (PEPCID) 40 MG tablet Take 40 mg by mouth 2 (two) times daily.    Marland Kitchen gabapentin (NEURONTIN) 300 MG capsule Take 1 capsule (300 mg total) by mouth at bedtime. 60 capsule 6  . glucose blood (RELION PRIME TEST) test strip Use as instructed once daily 100 each 12  . hydrochlorothiazide (HYDRODIURIL) 25 MG tablet Take 1 tablet (25 mg total) by mouth daily. 30 tablet 6  . hydrOXYzine (ATARAX/VISTARIL) 25 MG tablet Take 1 tablet (25 mg total) by mouth 2 (two) times daily as needed. 60 tablet 1  . isosorbide mononitrate (IMDUR) 60 MG 24 hr tablet Take 1 tablet (60 mg total) by mouth daily. 30 tablet 6  . levothyroxine (EUTHYROX) 150 MCG tablet Take 1 tablet (150 mcg total) by mouth daily before breakfast. Must have office visit for refills. 30 tablet 0  . losartan (COZAAR) 100 MG tablet Take 1 tablet (100 mg total) by mouth daily. 30 tablet 6  . metFORMIN (GLUCOPHAGE) 500 MG tablet Take 1 tablet (500 mg total) by mouth 2 (two) times daily with a meal. (Patient not taking: Reported on 07/14/2018) 60 tablet 6  .  metroNIDAZOLE (FLAGYL) 500 MG tablet Take 500 mg by mouth 2 (two) times daily. for 10 days    . Multiple Vitamin (MULTIVITAMIN WITH MINERALS) TABS tablet Take 1 tablet by mouth daily.    . Multiple Vitamins-Minerals (ECHINACEA ACZ PO) Take 1 tablet by mouth daily.    . naproxen sodium (ALEVE) 220 MG tablet Take 220 mg by mouth as needed (headache/pain).    . nitroGLYCERIN (NITROSTAT) 0.4 MG SL tablet Place 1 tablet (0.4 mg total) under the tongue every 5 (five) minutes as needed for chest pain. 20 tablet 0  . omeprazole (PRILOSEC) 20 MG capsule Take 1 capsule (20 mg total) by mouth 2 (two) times daily before a meal. 28 capsule 0  . omeprazole (PRILOSEC) 40 MG capsule TAKE 1 CAPSULE BY MOUTH ONCE DAILY FOR 90 DAYS    . ReliOn Lancets Micro-Thin 33G MISC Use as instructed once daily 100 each 12  . rosuvastatin (CRESTOR) 10 MG tablet Take 10 mg by mouth every evening.    Marland Kitchen tetracycline (SUMYCIN) 500 MG capsule TAKE 1 CAPSULE BY MOUTH EVERY 12 HOURS FOR 10 DAYS    . TURMERIC PO Take 1 tablet by mouth daily.     No current facility-administered medications on file prior to visit.      Allergies  Allergen Reactions  . Pollen Extract Other (See Comments)    Seasonal allergies  . Tomato Itching  .  Wool Alcohol [Lanolin] Itching    Reaction unknown    Objective:  Vascular: Capillary refill time <3 seconds b/l.  Dorsalis Pedis pulses and posterior tibial pulses are palpable b/l.  Digital hair sparse b/l.  Skin temperature gradient WNL b/l.   +Varicosities b/l LE.   Dermatological Examination: Pedal skin with normal turgor texture and tone b/l.   No open wounds.   No interdigital macerations.  Great toe noted to be incurvated with tenderness to palpation right hallux medial border. No purulence present. +Tenderness to palpation. No erythema, no drainage, no flocculence.   Musculoskeletal Muscle strength b/l to all muscle groups b/l.  HAV with bunion deformity b/l.    Neurological Sensation intact 5/5 b/l with 10 gram monofilament  Assessment: Ingrown toenail right hallux, noninfected  Plan: 1. Discussed diagnosis of with treatment options. 2. Patient/POA agreed to have temporary partial nail avulsion of the right great toe medial border. 3. Consent form signed and in chart. 4. Prepped great toe with betadine. 5. Local injection of  3 cc's 50/50 mix of 1% Lidocaine plain and 0.5% marcaine plain administered via hallux block. Offending nail plate freed utilizing elevator. Offending nail plae removed with hemostat. Border cleansed with alcohol. Light bleeding controlled with Lumicaine Hemostatic Solution. Light dressing applied. 6. Discused post-procedure instructions of epsom salt warm water soaks and dispensed written handout. 7. Rx for oral antibiotics Keflex 500 mg po tid x 7 days. 8. Patient/POA related understanding of post-procedure instructions 9. Follow up 2 weeks. Call office if there is increased redness, swelling, drainage, odor or pain.

## 2019-01-13 IMAGING — CT CT ANGIO CHEST
2 of 6 series · 19 of 46 positions shown · IV contrast (iopamidol)
Comparison: Chest x-ray today.

CLINICAL DATA: Intermittent central chest pressure and shortness of
breath with exertion since this morning.

EXAM:
CT ANGIOGRAPHY CHEST WITH CONTRAST
TECHNIQUE: Multidetector CT imaging of the chest was performed using the
standard protocol during bolus administration of intravenous
contrast. Multiplanar CT image reconstructions and MIPs were
obtained to evaluate the vascular anatomy.
CONTRAST:  <See Chart> RJLGX6-VO7 IOPAMIDOL (RJLGX6-VO7) INJECTION
76%

[Series 5: thins · axial · 0.77mm/px · z∈[+1478,+1727]mm · 16 of 273 slices shown]
[im 12/273  lung]
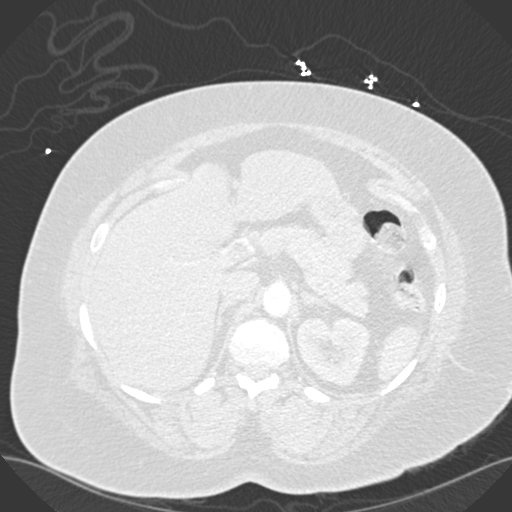
[im 36/273  soft-tissue]
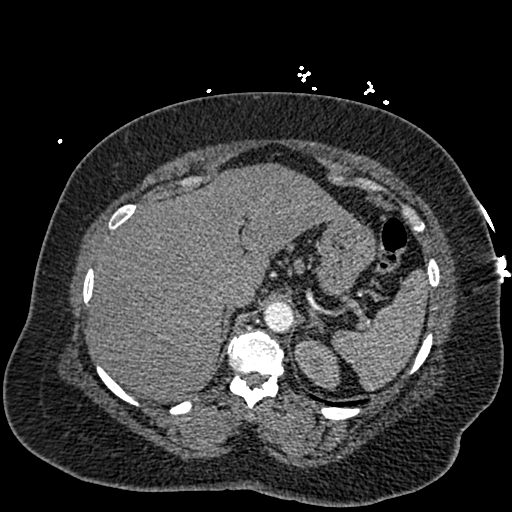
[im 48/273  lung]
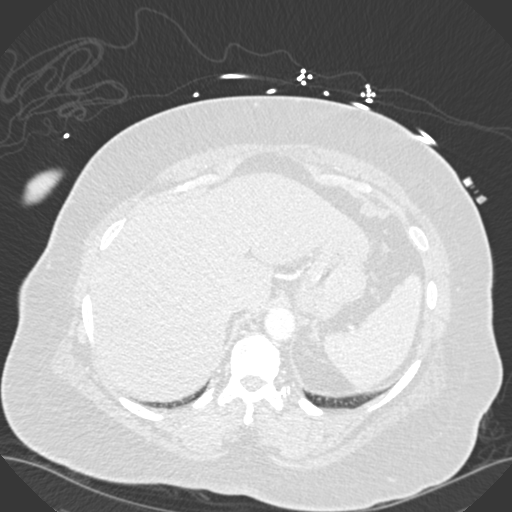
[im 60/273  soft-tissue]
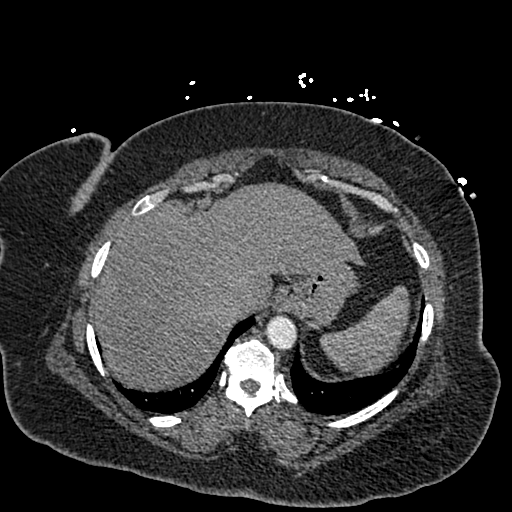
[im 83/273  lung]
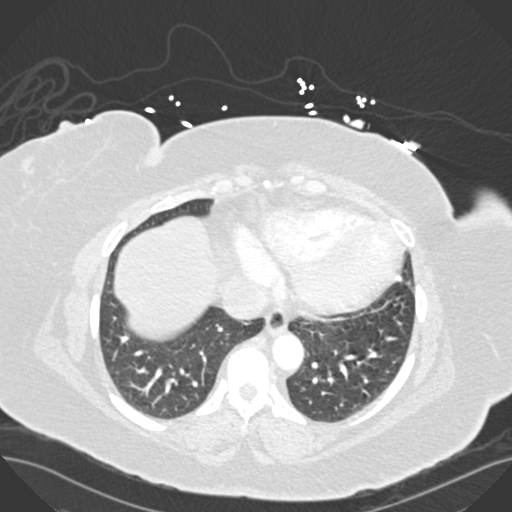
[im 95/273  soft-tissue]
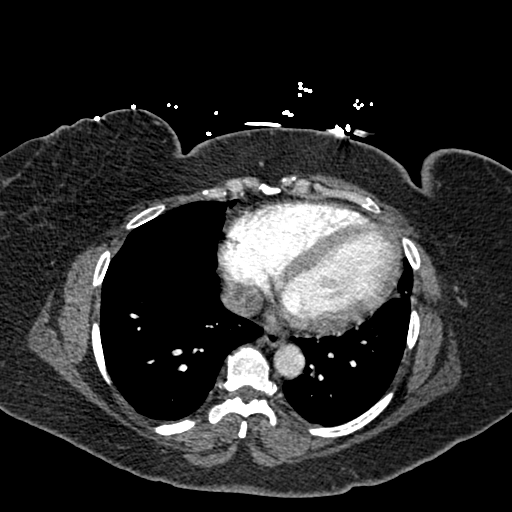
[im 107/273  lung]
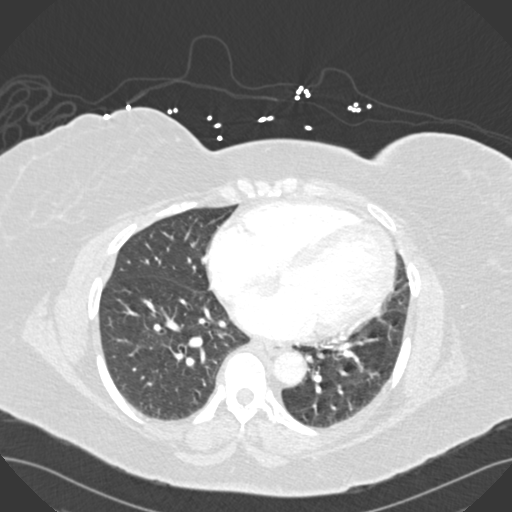
[im 131/273  soft-tissue]
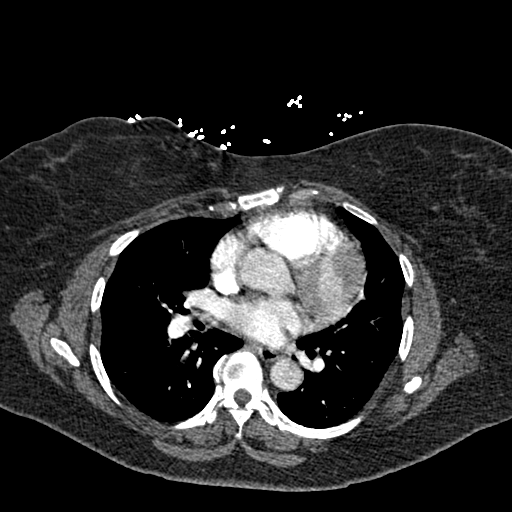
[im 142/273  lung]
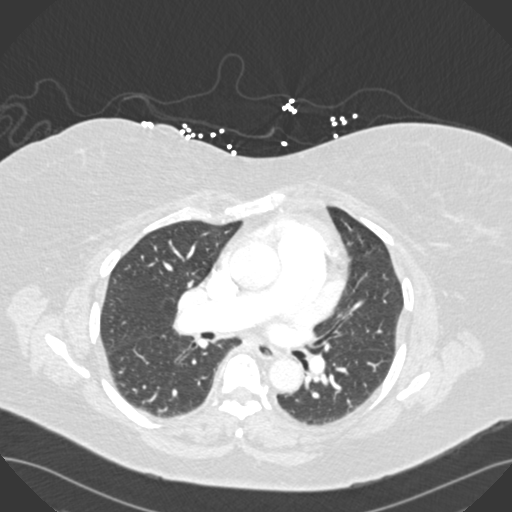
[im 166/273  soft-tissue]
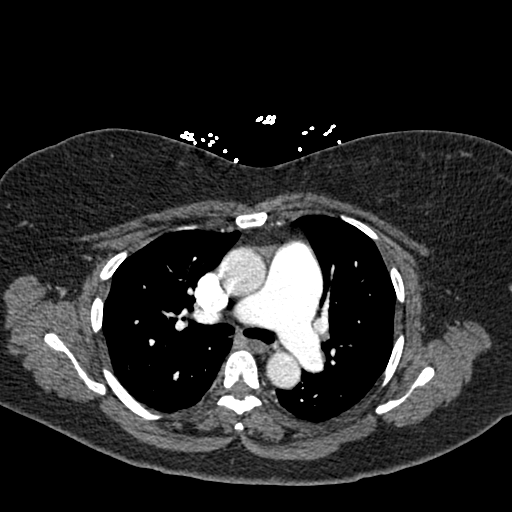
[im 178/273  lung]
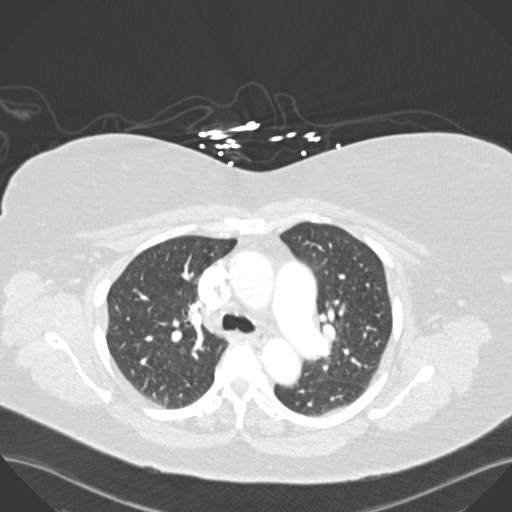
[im 190/273  soft-tissue]
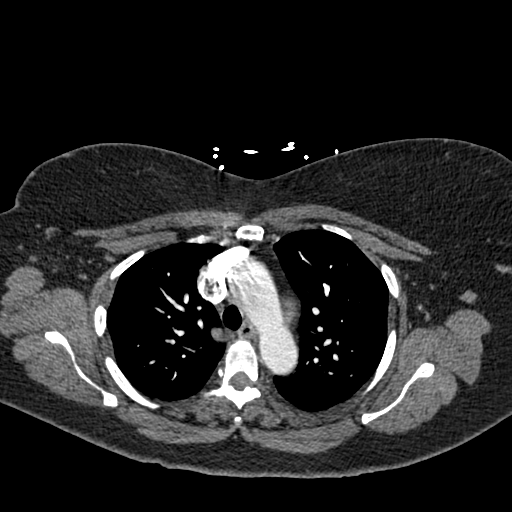
[im 213/273  lung]
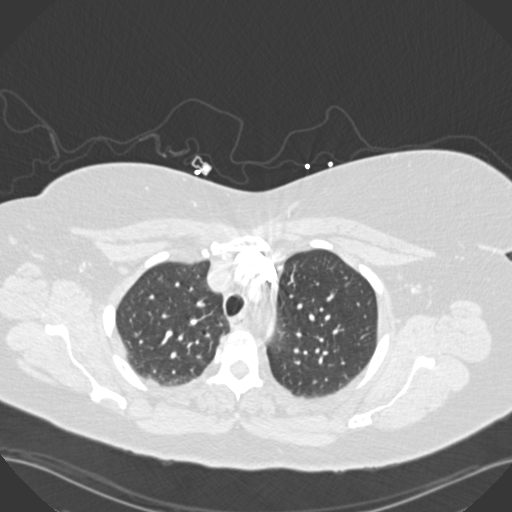
[im 225/273  soft-tissue]
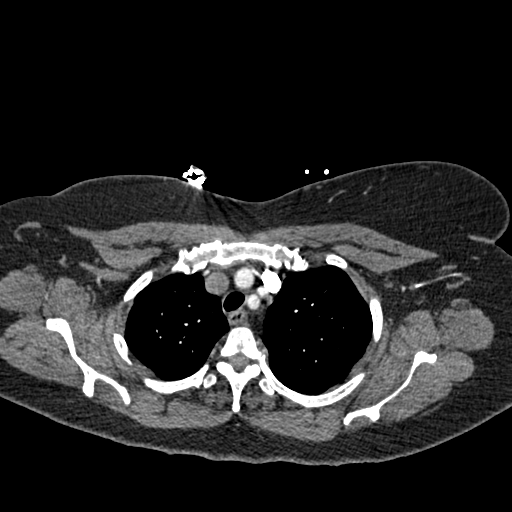
[im 237/273  lung]
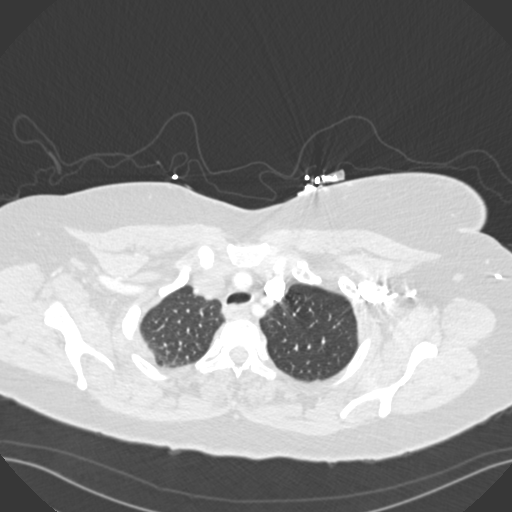
[im 261/273  soft-tissue]
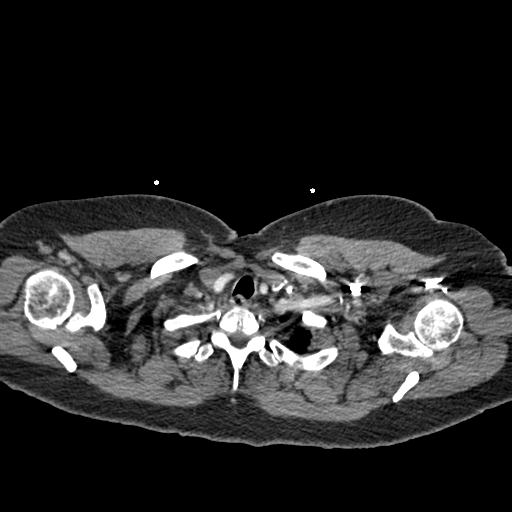

[Series 6: coronal mpr · coronal · 0.55mm/px · 3 of 151 slices shown]
[im 38/151  soft-tissue]
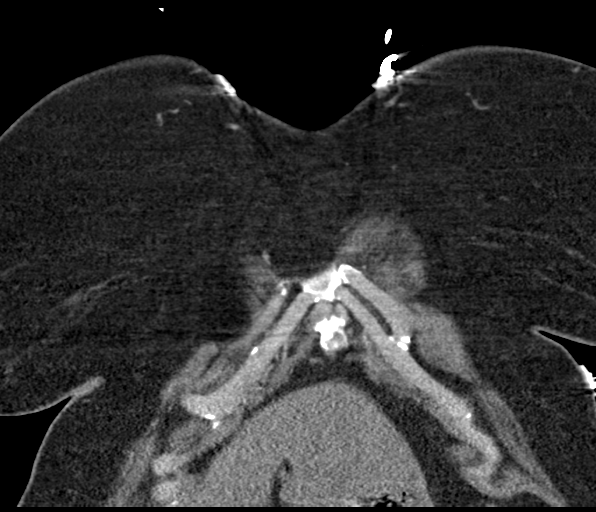
[im 76/151  soft-tissue]
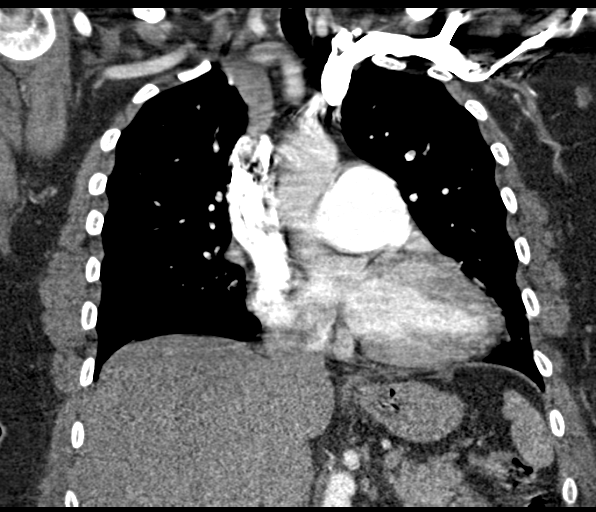
[im 113/151  soft-tissue]
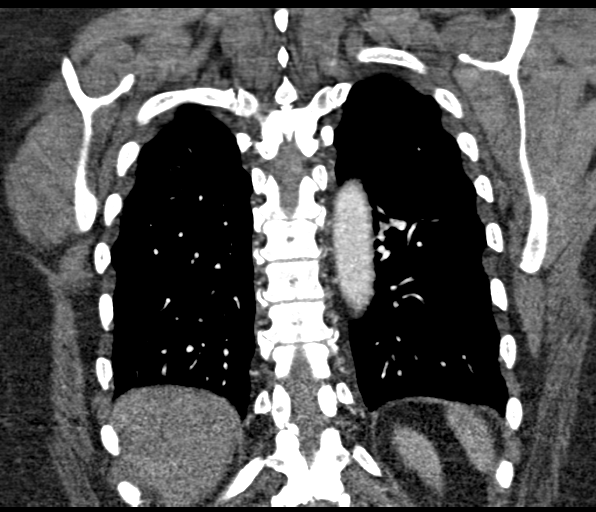

[19 of 46 positions shown; findings below may reference images not displayed]

FINDINGS: Cardiovascular: Heart is normal in size thoracic aorta is within
normal. There is prominence of the main pulmonary artery segment. No
evidence pulmonary embolism.

Mediastinum/Nodes: No mediastinal or hilar adenopathy. Remaining
mediastinal structures are within normal.

Lungs/Pleura: Lungs are adequately inflated without focal airspace
consolidation or effusion. Airways are within normal.

Upper Abdomen: Within normal.

Musculoskeletal: Minimal degenerate change of the spine.

Review of the MIP images confirms the above findings.
IMPRESSION: No evidence pulmonary embolism.  No acute cardiopulmonary disease.

Prominence of the main pulmonary artery which may be seen with
pulmonary arterial hypertension.

## 2019-02-19 ENCOUNTER — Inpatient Hospital Stay: Admission: RE | Admit: 2019-02-19 | Payer: Medicare Other | Source: Ambulatory Visit

## 2019-02-19 ENCOUNTER — Ambulatory Visit: Payer: Medicare Other

## 2019-02-24 ENCOUNTER — Ambulatory Visit (INDEPENDENT_AMBULATORY_CARE_PROVIDER_SITE_OTHER): Payer: Medicare Other | Admitting: Podiatry

## 2019-02-24 ENCOUNTER — Other Ambulatory Visit: Payer: Self-pay

## 2019-02-24 ENCOUNTER — Encounter: Payer: Self-pay | Admitting: Podiatry

## 2019-02-24 DIAGNOSIS — B351 Tinea unguium: Secondary | ICD-10-CM | POA: Diagnosis not present

## 2019-02-24 DIAGNOSIS — M79674 Pain in right toe(s): Secondary | ICD-10-CM

## 2019-02-24 DIAGNOSIS — M79675 Pain in left toe(s): Secondary | ICD-10-CM

## 2019-02-24 DIAGNOSIS — E119 Type 2 diabetes mellitus without complications: Secondary | ICD-10-CM | POA: Diagnosis not present

## 2019-02-24 NOTE — Patient Instructions (Signed)

## 2019-03-01 NOTE — Progress Notes (Signed)
Subjective: Tiffany Strickland presents today for preventative diabetic foot. Patient is seen for follow up of painful, mycotic toenails which interfere with comfortable ambulation when wearing enclosed shoe gear. Pain is relieved with periodic professional debridement.  She states her right great toe feels much better since she had ingrown nail removed.   Medications reviewed in chart.  Allergies  Allergen Reactions  . Pollen Extract Other (See Comments)    Seasonal allergies  . Tomato Itching  . Wool Alcohol [Lanolin] Itching    Reaction unknown   Objective: There were no vitals filed for this visit.  Vascular Examination: Capillary refill time to digits b/l.   Dorsalis pedis present b/l.  Posterior tibial pulses present b/l.  Digital hair sparse b/l.  +Varicosities b/l.   Skin temperature gradient WNL b/l.   Dermatological Examination: Skin with normal turgor, texture and tone b/l.  Toenails 1-5 b/l discolored, thick, dystrophic with subungual debris and pain with palpation to nailbeds due to thickness of nails.  Medial nail border right great toe with hyperkeratotic debris with tenderness to palpation. No erythema, no edema, no drainage noted.  Musculoskeletal: Muscle strength 5/5 b/l to all LE muscle groups.  Gross bony deformities:  Bunion deformity b/l.  No pain, crepitus or joint limitation with passive/active ROM b/l.  Neurological Examination: Protective sensation intact 5/5 b/l with 10 gram monofilament.  Vibratory sensation intact bilaterally.   Assessment: 1. Painful onychomycosis toenails 1-5 b/l 2. NIDDM  Plan: 1. Continue diabetic foot care principles. Literature dispensed on today. 2. Toenails 1-5 b/l were debrided in length and girth without iatrogenic bleeding. 3. Patient to continue soft, supportive shoe gear. 4. Patient to report any pedal injuries to medical professional. 5. Follow up 9 weeks. 6. Patient/POA to call should there be a  concern in the interim.

## 2019-03-09 ENCOUNTER — Other Ambulatory Visit: Payer: Self-pay | Admitting: Family Medicine

## 2019-03-09 DIAGNOSIS — I10 Essential (primary) hypertension: Secondary | ICD-10-CM

## 2019-03-16 ENCOUNTER — Encounter: Payer: Self-pay | Admitting: Advanced Practice Midwife

## 2019-03-16 ENCOUNTER — Other Ambulatory Visit (HOSPITAL_COMMUNITY)
Admission: RE | Admit: 2019-03-16 | Discharge: 2019-03-16 | Disposition: A | Discharge status: Home / Self Care | Payer: Medicare Other | Source: Ambulatory Visit | Attending: Advanced Practice Midwife | Admitting: Advanced Practice Midwife

## 2019-03-16 ENCOUNTER — Ambulatory Visit (INDEPENDENT_AMBULATORY_CARE_PROVIDER_SITE_OTHER): Payer: Medicare Other | Admitting: Advanced Practice Midwife

## 2019-03-16 ENCOUNTER — Other Ambulatory Visit: Payer: Self-pay

## 2019-03-16 VITALS — BP 152/77 | HR 67 | Ht 65.0 in | Wt 258.7 lb

## 2019-03-16 DIAGNOSIS — Z1151 Encounter for screening for human papillomavirus (HPV): Secondary | ICD-10-CM | POA: Insufficient documentation

## 2019-03-16 DIAGNOSIS — Z124 Encounter for screening for malignant neoplasm of cervix: Secondary | ICD-10-CM | POA: Diagnosis not present

## 2019-03-16 DIAGNOSIS — Z01419 Encounter for gynecological examination (general) (routine) without abnormal findings: Secondary | ICD-10-CM | POA: Diagnosis not present

## 2019-03-16 DIAGNOSIS — Z90710 Acquired absence of both cervix and uterus: Secondary | ICD-10-CM | POA: Insufficient documentation

## 2019-03-16 DIAGNOSIS — Z113 Encounter for screening for infections with a predominantly sexual mode of transmission: Secondary | ICD-10-CM | POA: Diagnosis not present

## 2019-03-16 DIAGNOSIS — B372 Candidiasis of skin and nail: Secondary | ICD-10-CM | POA: Diagnosis not present

## 2019-03-16 MED ORDER — NYSTATIN 100000 UNIT/GM EX POWD: 1.0000 "application " | Freq: Three times a day (TID) | CUTANEOUS | 0 refills | Status: DC

## 2019-03-16 NOTE — Progress Notes (Signed)
NGYN patient presents for Annual Exam.  Mammogram:07/11/16 scheduled 05/06/19

## 2019-03-16 NOTE — Progress Notes (Signed)
  Patient is a 64 yo [id number] female that is presenting for an annual exam   Subjective:     Tiffany Strickland is a 64 y.o. female here at [facility] for a routine annual exam and to establish care. She has not been seen by a gynecologist in years and cannot remember the last time she had a PAP smear. She reports having a hysterectomy. She does not know if she has her cervix or not. She does not have any vaginal bleeding, pain, discharge, itching or other complaints.  Reports medical history that includes HTN, DM, and thyroid issues. She sees her PCP regularly. She has her next mammogram and bone density scan scheduled.  Gynecologic History No LMP recorded. Patient has had a hysterectomy. Contraception: status post hysterectomy Last Pap: unknown. Results were: normal Last mammogram: 2-3 years. Results were: normal  Obstetric History OB History  Gravida Para Term Preterm AB Living  6 5 5   1 5   SAB TAB Ectopic Multiple Live Births  1            # Outcome Date GA Lbr Len/2nd Weight Sex Delivery Anes PTL Lv  6 SAB           5 Term           4 Term           3 Term           2 Term           1 Term              The following portions of the patient's history were reviewed and updated as appropriate: allergies, current medications, past family history, past medical history, past social history, past surgical history and problem list.  Review of Systems Pertinent items noted in HPI and remainder of comprehensive ROS otherwise negative.    Objective:   BP (!) 152/77   Pulse 67   Ht 5\' 5"  (1.651 m)   Wt 258 lb 11.2 oz (117.3 kg)   BMI 43.05 kg/m    VS reviewed, nursing note reviewed,  Constitutional: well developed, well nourished, no distress HEENT: normocephalic CV: normal rate Pulm/chest wall: normal effort Breast Exam:  right breast normal without mass, skin or nipple changes or axillary nodes, left breast normal without mass, skin or nipple changes or axillary  nodes Abdomen: soft Neuro: alert and oriented x 3 Skin: warm, dry Psych: affect normal Pelvic exam: Cervical tissue not well visualized, ? Vaginal cuff, tissue pink, healthy, scant white creamy discharge, vaginal walls and external genitalia normal Bimanual exam: uterus nontender, nonenlarged, adnexa without tenderness, enlargement, or mass     Assessment/Plan:   1. Screening for cervical cancer  - Cytology - PAP( [place])  2. Well woman exam with routine gynecological exam --No gyn concerns or complaints  3. Screen for STD (sexually transmitted disease)  - Cervicovaginal ancillary only( [place]) - Hepatitis B surface antigen - Hepatitis C antibody - HIV Antibody (routine testing w rflx) - RPR  4. Skin candidiasis --Itching rash in groin area, not vaginally.  White/gray flaky rash noted bilaterally in inguinal areas.  - nystatin (MYCOSTATIN/NYSTOP) powder; Apply 1 application topically 3 (three) times daily.  Dispense: 15 g; Refill: 0     Follow up in: 1 year or as needed.   Fatima Blank, CNM 5:53 PM

## 2019-03-17 LAB — CERVICOVAGINAL ANCILLARY ONLY
Bacterial Vaginitis (gardnerella): POSITIVE — AB
Candida Glabrata: POSITIVE — AB
Candida Vaginitis: NEGATIVE
Chlamydia: NEGATIVE
Comment: NEGATIVE
Comment: NEGATIVE
Comment: NEGATIVE
Comment: NEGATIVE
Comment: NEGATIVE
Comment: NORMAL
Neisseria Gonorrhea: NEGATIVE
Trichomonas: NEGATIVE

## 2019-03-17 LAB — CYTOLOGY - PAP
Comment: NEGATIVE
Diagnosis: NEGATIVE
High risk HPV: NEGATIVE

## 2019-03-17 LAB — HIV ANTIBODY (ROUTINE TESTING W REFLEX): HIV Screen 4th Generation wRfx: NONREACTIVE

## 2019-03-17 LAB — HEPATITIS B SURFACE ANTIGEN: Hepatitis B Surface Ag: NEGATIVE

## 2019-03-17 LAB — RPR: RPR Ser Ql: NONREACTIVE

## 2019-03-17 LAB — HEPATITIS C ANTIBODY: Hep C Virus Ab: <0.1 s/co ratio (ref 0.0–0.9)

## 2019-03-30 ENCOUNTER — Ambulatory Visit: Payer: Medicare Other | Admitting: Podiatry

## 2019-04-19 DIAGNOSIS — R0789 Other chest pain: Secondary | ICD-10-CM | POA: Diagnosis not present

## 2019-04-19 DIAGNOSIS — E785 Hyperlipidemia, unspecified: Secondary | ICD-10-CM | POA: Diagnosis not present

## 2019-04-19 DIAGNOSIS — I10 Essential (primary) hypertension: Secondary | ICD-10-CM | POA: Diagnosis not present

## 2019-04-19 DIAGNOSIS — E119 Type 2 diabetes mellitus without complications: Secondary | ICD-10-CM | POA: Diagnosis not present

## 2019-04-19 DIAGNOSIS — I208 Other forms of angina pectoris: Secondary | ICD-10-CM | POA: Diagnosis not present

## 2019-04-27 DIAGNOSIS — E890 Postprocedural hypothyroidism: Secondary | ICD-10-CM | POA: Diagnosis not present

## 2019-04-27 DIAGNOSIS — M79671 Pain in right foot: Secondary | ICD-10-CM | POA: Diagnosis not present

## 2019-04-27 DIAGNOSIS — E119 Type 2 diabetes mellitus without complications: Secondary | ICD-10-CM | POA: Diagnosis not present

## 2019-04-27 DIAGNOSIS — E785 Hyperlipidemia, unspecified: Secondary | ICD-10-CM | POA: Diagnosis not present

## 2019-04-27 DIAGNOSIS — I10 Essential (primary) hypertension: Secondary | ICD-10-CM | POA: Diagnosis not present

## 2019-04-28 ENCOUNTER — Ambulatory Visit (INDEPENDENT_AMBULATORY_CARE_PROVIDER_SITE_OTHER): Payer: Medicare Other

## 2019-04-28 ENCOUNTER — Ambulatory Visit (INDEPENDENT_AMBULATORY_CARE_PROVIDER_SITE_OTHER): Payer: Medicare Other | Admitting: Podiatry

## 2019-04-28 ENCOUNTER — Other Ambulatory Visit: Payer: Self-pay

## 2019-04-28 ENCOUNTER — Encounter: Payer: Self-pay | Admitting: Podiatry

## 2019-04-28 VITALS — Temp 96.0°F

## 2019-04-28 DIAGNOSIS — B351 Tinea unguium: Secondary | ICD-10-CM | POA: Diagnosis not present

## 2019-04-28 DIAGNOSIS — M79675 Pain in left toe(s): Secondary | ICD-10-CM | POA: Diagnosis not present

## 2019-04-28 DIAGNOSIS — M7752 Other enthesopathy of left foot: Secondary | ICD-10-CM

## 2019-04-28 DIAGNOSIS — R413 Other amnesia: Secondary | ICD-10-CM | POA: Diagnosis not present

## 2019-04-28 DIAGNOSIS — E119 Type 2 diabetes mellitus without complications: Secondary | ICD-10-CM

## 2019-04-28 DIAGNOSIS — M79674 Pain in right toe(s): Secondary | ICD-10-CM

## 2019-04-28 DIAGNOSIS — M19072 Primary osteoarthritis, left ankle and foot: Secondary | ICD-10-CM

## 2019-04-28 DIAGNOSIS — L84 Corns and callosities: Secondary | ICD-10-CM | POA: Diagnosis not present

## 2019-04-28 DIAGNOSIS — E890 Postprocedural hypothyroidism: Secondary | ICD-10-CM | POA: Diagnosis not present

## 2019-04-28 DIAGNOSIS — K649 Unspecified hemorrhoids: Secondary | ICD-10-CM | POA: Diagnosis not present

## 2019-04-28 DIAGNOSIS — M79671 Pain in right foot: Secondary | ICD-10-CM | POA: Diagnosis not present

## 2019-04-28 NOTE — Patient Instructions (Addendum)
Corns and Calluses Corns are small areas of thickened skin that occur on the top, sides, or tip of a toe. They contain a cone-shaped core with a point that can press on a nerve below. This causes pain.  Calluses are areas of thickened skin that can occur anywhere on the body, including the hands, fingers, palms, soles of the feet, and heels. Calluses are usually larger than corns. What are the causes? Corns and calluses are caused by rubbing (friction) or pressure, such as from shoes that are too tight or do not fit properly. What increases the risk? Corns are more likely to develop in people who have misshapen toes (toe deformities), such as hammer toes. Calluses can occur with friction to any area of the skin. They are more likely to develop in people who:  Work with their hands.  Wear shoes that fit poorly, are too tight, or are high-heeled.  Have toe deformities. What are the signs or symptoms? Symptoms of a corn or callus include:  A hard growth on the skin.  Pain or tenderness under the skin.  Redness and swelling.  Increased discomfort while wearing tight-fitting shoes, if your feet are affected. If a corn or callus becomes infected, symptoms may include:  Redness and swelling that gets worse.  Pain.  Fluid, blood, or pus draining from the corn or callus. How is this diagnosed? Corns and calluses may be diagnosed based on your symptoms, your medical history, and a physical exam. How is this treated? Treatment for corns and calluses may include:  Removing the cause of the friction or pressure. This may involve: ? Changing your shoes. ? Wearing shoe inserts (orthotics) or other protective layers in your shoes, such as a corn pad. ? Wearing gloves.  Applying medicine to the skin (topical medicine) to help soften skin in the hardened, thickened areas.  Removing layers of dead skin with a file to reduce the size of the corn or callus.  Removing the corn or callus with a  scalpel or laser.  Taking antibiotic medicines, if your corn or callus is infected.  Having surgery, if a toe deformity is the cause. Follow these instructions at home:   Take over-the-counter and prescription medicines only as told by your health care provider.  If you were prescribed an antibiotic, take it as told by your health care provider. Do not stop taking it even if your condition starts to improve.  Wear shoes that fit well. Avoid wearing high-heeled shoes and shoes that are too tight or too loose.  Wear any padding, protective layers, gloves, or orthotics as told by your health care provider.  Soak your hands or feet and then use a file or pumice stone to soften your corn or callus. Do this as told by your health care provider.  Check your corn or callus every day for symptoms of infection. Contact a health care provider if you:  Notice that your symptoms do not improve with treatment.  Have redness or swelling that gets worse.  Notice that your corn or callus becomes painful.  Have fluid, blood, or pus coming from your corn or callus.  Have new symptoms. Summary  Corns are small areas of thickened skin that occur on the top, sides, or tip of a toe.  Calluses are areas of thickened skin that can occur anywhere on the body, including the hands, fingers, palms, and soles of the feet. Calluses are usually larger than corns.  Corns and calluses are caused by   rubbing (friction) or pressure, such as from shoes that are too tight or do not fit properly.  Treatment may include wearing any padding, protective layers, gloves, or orthotics as told by your health care provider. This information is not intended to replace advice given to you by your health care provider. Make sure you discuss any questions you have with your health care provider. Document Revised: 05/20/2018 Document Reviewed: 12/11/2016 Elsevier Patient Education  [place].  Diabetes Mellitus and  [place] care is an important part of your health, especially when you have diabetes. Diabetes may cause you to have problems because of poor blood flow (circulation) to your feet and legs, which can cause your skin to:  Become thinner and drier.  Break more easily.  Heal more slowly.  Peel and crack. You may also have nerve damage (neuropathy) in your legs and feet, causing decreased feeling in them. This means that you may not notice minor injuries to your feet that could lead to more serious problems. Noticing and addressing any potential problems early is the best way to prevent future foot problems. How to care for your feet Foot hygiene  Wash your feet daily with warm water and mild soap. Do not use hot water. Then, pat your feet and the areas between your toes until they are completely dry. Do not soak your feet as this can dry your skin.  Trim your toenails straight across. Do not dig under them or around the cuticle. File the edges of your nails with an emery board or nail file.  Apply a moisturizing lotion or petroleum jelly to the skin on your feet and to dry, brittle toenails. Use lotion that does not contain alcohol and is unscented. Do not apply lotion between your toes. Shoes and socks  Wear clean socks or stockings every day. Make sure they are not too tight. Do not wear knee-high stockings since they may decrease blood flow to your legs.  Wear shoes that fit properly and have enough cushioning. Always look in your shoes before you put them on to be sure there are no objects inside.  To break in new shoes, wear them for just a few hours a day. This prevents injuries on your feet. Wounds, scrapes, corns, and calluses  Check your feet daily for blisters, cuts, bruises, sores, and redness. If you cannot see the bottom of your feet, use a mirror or ask someone for help.  Do not cut corns or calluses or try to remove them with medicine.  If you find a minor scrape,  cut, or break in the skin on your feet, keep it and the skin around it clean and dry. You may clean these areas with mild soap and water. Do not clean the area with peroxide, alcohol, or iodine.  If you have a wound, scrape, corn, or callus on your foot, look at it several times a day to make sure it is healing and not infected. Check for: ? Redness, swelling, or pain. ? Fluid or blood. ? Warmth. ? Pus or a bad smell. General instructions  Do not cross your legs. This may decrease blood flow to your feet.  Do not use heating pads or hot water bottles on your feet. They may burn your skin. If you have lost feeling in your feet or legs, you may not know this is happening until it is too late.  Protect your feet from hot and cold by wearing shoes, such as at  the beach or on hot pavement.  Schedule a complete foot exam at least once a year (annually) or more often if you have foot problems. If you have foot problems, report any cuts, sores, or bruises to your health care provider immediately. Contact a health care provider if:  You have a medical condition that increases your risk of infection and you have any cuts, sores, or bruises on your feet.  You have an injury that is not healing.  You have redness on your legs or feet.  You feel burning or tingling in your legs or feet.  You have pain or cramps in your legs and feet.  Your legs or feet are numb.  Your feet always feel cold.  You have pain around a toenail. Get help right away if:  You have a wound, scrape, corn, or callus on your foot and: ? You have pain, swelling, or redness that gets worse. ? You have fluid or blood coming from the wound, scrape, corn, or callus. ? Your wound, scrape, corn, or callus feels warm to the touch. ? You have pus or a bad smell coming from the wound, scrape, corn, or callus. ? You have a fever. ? You have a red line going up your leg. Summary  Check your feet every day for cuts, sores, red  spots, swelling, and blisters.  Moisturize feet and legs daily.  Wear shoes that fit properly and have enough cushioning.  If you have foot problems, report any cuts, sores, or bruises to your health care provider immediately.  Schedule a complete foot exam at least once a year (annually) or more often if you have foot problems. This information is not intended to replace advice given to you by your health care provider. Make sure you discuss any questions you have with your health care provider. Document Revised: 10/21/2018 Document Reviewed: 03/01/2016 Elsevier Patient Education  [place] is a term that is commonly used to refer to joint pain or joint disease. There are more than 100 types of arthritis. What are the causes? The most common cause of this condition is wear and tear of a joint. Other causes include:  Gout.  Inflammation of a joint.  An infection of a joint.  Sprains and other injuries near the joint.  A reaction to medicines or drugs, or an allergic reaction. In some cases, the cause may not be known. What are the signs or symptoms? The main symptom of this condition is pain in the joint during movement. Other symptoms include:  Redness, swelling, or stiffness at a joint.  Warmth coming from the joint.  Fever.  Overall feeling of illness. How is this diagnosed? This condition may be diagnosed with a physical exam and tests, including:  Blood tests.  Urine tests.  Imaging tests, such as X-rays, an MRI, or a CT scan. Sometimes, fluid is removed from a joint for testing. How is this treated? This condition may be treated with:  Treatment of the cause, if it is known.  Rest.  Raising (elevating) the joint.  Applying cold or hot packs to the joint.  Medicines to improve symptoms and reduce inflammation.  Injections of a steroid such as cortisone into the joint to help reduce pain and inflammation. Depending on  the cause of your arthritis, you may need to make lifestyle changes to reduce stress on your joint. Changes may include:  Exercising more.  Losing weight. Follow these instructions at home: Medicines  Take over-the-counter and prescription medicines only as told by your health care provider.  Do not take aspirin to relieve pain if your health care provider thinks that gout may be causing your pain. Activity  Rest your joint if told by your health care provider. Rest is important when your disease is active and your joint feels painful, swollen, or stiff.  Avoid activities that make the pain worse. It is important to balance activity with rest.  Exercise your joint regularly with range-of-motion exercises as told by your health care provider. Try doing low-impact exercise, such as: ? Swimming. ? Water aerobics. ? Biking. ? Walking. Managing pain, stiffness, and swelling      If directed, put ice on the joint. ? Put ice in a plastic bag. ? Place a towel between your skin and the bag. ? Leave the ice on for 20 minutes, 2-3 times per day.  If your joint is swollen, raise (elevate) it above the level of your heart if directed by your health care provider.  If your joint feels stiff in the morning, try taking a warm shower.  If directed, apply heat to the affected area as often as told by your health care provider. Use the heat source that your health care provider recommends, such as a moist heat pack or a heating pad. If you have diabetes, do not apply heat without permission from your health care provider. To apply heat: ? Place a towel between your skin and the heat source. ? Leave the heat on for 20-30 minutes. ? Remove the heat if your skin turns bright red. This is especially important if you are unable to feel pain, heat, or cold. You may have a greater risk of getting burned. General instructions  Do not use any products that contain nicotine or tobacco, such as  cigarettes, e-cigarettes, and chewing tobacco. If you need help quitting, ask your health care provider.  Keep all follow-up visits as told by your health care provider. This is important. Contact a health care provider if:  The pain gets worse.  You have a fever. Get help right away if:  You develop severe joint pain, swelling, or redness.  Many joints become painful and swollen.  You develop severe back pain.  You develop severe weakness in your leg.  You cannot control your bladder or bowels. Summary  Arthritis is a term that is commonly used to refer to joint pain or joint disease. There are more than 100 types of arthritis.  The most common cause of this condition is wear and tear of a joint. Other causes include gout, inflammation or infection of the joint, sprains, or allergies.  Symptoms of this condition include redness, swelling, or stiffness of the joint. Other symptoms include warmth, fever, or feeling ill.  This condition is treated with rest, elevation, medicines, and applying cold or hot packs.  Follow your health care provider's instructions about medicines, activity, exercises, and other home care treatments. This information is not intended to replace advice given to you by your health care provider. Make sure you discuss any questions you have with your health care provider. Document Revised: 01/05/2018 Document Reviewed: 01/05/2018 Elsevier Patient Education  2020 [employer].

## 2019-05-03 NOTE — Progress Notes (Signed)
Subjective: Razan Belich presents today for follow up of with chief concern of left anterior ankle pain for the past 6 months; she states pain comes and goes and finds it hard to bear weight at times.  She also presents for, preventative diabetic foot care and callus(es) b/l hallus and distal tip right great toe and painful mycotic toenails b/l that are difficult to trim. Pain interferes with ambulation. Aggravating factors include wearing enclosed shoe gear. Pain is relieved with periodic professional debridement.  She states her PCP ordered blood work to rule out gout on yesterday.  Allergies  Allergen Reactions  . Pollen Extract Other (See Comments)    Seasonal allergies  . Tomato Itching  . Wool Alcohol [Lanolin] Itching    Reaction unknown     Objective: Vitals:   04/28/19 1419  Temp: (!) 72 F (35.6 C)    Pt 64 y.o. year old AA  female  in NAD. AAO x 3.   Vascular Examination:  Capillary refill time to digits immediate b/l. Palpable DP pulses b/l. Palpable PT pulses b/l. Pedal hair present b/l. Skin temperature gradient within normal limits b/l. +1 pitting edema b/l LE. Varicosities present b/l.  Dermatological Examination: Pedal skin with normal turgor, texture and tone bilaterally. No open wounds bilaterally. No interdigital macerations bilaterally. Toenails 1-5 b/l elongated, dystrophic, thickened, crumbly with subungual debris and tenderness to dorsal palpation.  Musculoskeletal: Normal muscle strength 5/5 to all lower extremity muscle groups bilaterally, no pain crepitus or joint limitation noted with ROM b/l and bunion deformity noted b/l  Neurological: Protective sensation intact 5/5 intact bilaterally with 10g monofilament b/l Vibratory sensation intact b/l   Xrays left ankle: No gas in tissues +soft tissue edema DJD noted anterior ankle joint at tibiotalar joint with anterior spurring.  Assessment: 1. Pain due to onychomycosis of toenails of both feet   2.  Osteoarthritis of left ankle and foot   3. Callus   4. Controlled type 2 diabetes mellitus without complication, without long-term current use of insulin ([place])    Plan: -Continue diabetic foot care principles. Literature dispensed on today.  -Toenails 1-5 b/l were debrided in length and girth with sterile nail nippers and dremel without iatrogenic bleeding.  -Callus(es) b/l hallux IPJ and distal tip right great toe were debrided without complication or incident. Total number debrided =3. -Xrays taken and reviewed with Ms. Boord in office. -Patient to continue soft, supportive shoe gear daily. -Patient to report any pedal injuries to medical professional immediately. -Patient/POA to call should there be question/concern in the interim.  Return in about 9 weeks (around 06/30/2019) for diabetic nail and callus trim.

## 2019-05-06 ENCOUNTER — Ambulatory Visit
Admission: RE | Admit: 2019-05-06 | Discharge: 2019-05-06 | Disposition: A | Discharge status: Home / Self Care | Payer: Medicare Other | Source: Ambulatory Visit | Attending: Internal Medicine | Admitting: Internal Medicine

## 2019-05-06 ENCOUNTER — Other Ambulatory Visit: Payer: Self-pay

## 2019-05-06 DIAGNOSIS — Z1231 Encounter for screening mammogram for malignant neoplasm of breast: Secondary | ICD-10-CM | POA: Diagnosis not present

## 2019-05-06 DIAGNOSIS — Z780 Asymptomatic menopausal state: Secondary | ICD-10-CM | POA: Diagnosis not present

## 2019-05-06 DIAGNOSIS — E2839 Other primary ovarian failure: Secondary | ICD-10-CM

## 2019-05-07 ENCOUNTER — Ambulatory Visit: Payer: Medicare Other | Admitting: Orthotics

## 2019-05-10 DIAGNOSIS — G4733 Obstructive sleep apnea (adult) (pediatric): Secondary | ICD-10-CM | POA: Diagnosis not present

## 2019-06-08 DIAGNOSIS — I208 Other forms of angina pectoris: Secondary | ICD-10-CM | POA: Diagnosis not present

## 2019-06-08 DIAGNOSIS — E785 Hyperlipidemia, unspecified: Secondary | ICD-10-CM | POA: Diagnosis not present

## 2019-06-08 DIAGNOSIS — I639 Cerebral infarction, unspecified: Secondary | ICD-10-CM | POA: Diagnosis not present

## 2019-06-08 DIAGNOSIS — G4733 Obstructive sleep apnea (adult) (pediatric): Secondary | ICD-10-CM | POA: Diagnosis not present

## 2019-06-08 DIAGNOSIS — I10 Essential (primary) hypertension: Secondary | ICD-10-CM | POA: Diagnosis not present

## 2019-06-10 DIAGNOSIS — G4733 Obstructive sleep apnea (adult) (pediatric): Secondary | ICD-10-CM | POA: Diagnosis not present

## 2019-06-18 ENCOUNTER — Telehealth: Payer: Self-pay | Admitting: Cardiology

## 2019-06-18 DIAGNOSIS — R413 Other amnesia: Secondary | ICD-10-CM | POA: Diagnosis not present

## 2019-06-18 DIAGNOSIS — E119 Type 2 diabetes mellitus without complications: Secondary | ICD-10-CM | POA: Diagnosis not present

## 2019-06-18 DIAGNOSIS — I10 Essential (primary) hypertension: Secondary | ICD-10-CM | POA: Diagnosis not present

## 2019-06-18 DIAGNOSIS — R251 Tremor, unspecified: Secondary | ICD-10-CM | POA: Diagnosis not present

## 2019-06-18 DIAGNOSIS — E890 Postprocedural hypothyroidism: Secondary | ICD-10-CM | POA: Diagnosis not present

## 2019-06-18 NOTE — Telephone Encounter (Signed)
Patient wasn't satisfied so established else where, stated they had virtual visit and never received follow up call.

## 2019-06-21 ENCOUNTER — Other Ambulatory Visit: Payer: Self-pay

## 2019-06-21 ENCOUNTER — Ambulatory Visit (HOSPITAL_COMMUNITY)
Admission: EM | Admit: 2019-06-21 | Discharge: 2019-06-21 | Disposition: A | Discharge status: Home / Self Care | Payer: Medicare Other | Attending: Internal Medicine | Admitting: Internal Medicine

## 2019-06-21 ENCOUNTER — Other Ambulatory Visit: Payer: Self-pay | Admitting: Family Medicine

## 2019-06-21 DIAGNOSIS — T148XXA Other injury of unspecified body region, initial encounter: Secondary | ICD-10-CM

## 2019-06-21 DIAGNOSIS — I10 Essential (primary) hypertension: Secondary | ICD-10-CM

## 2019-06-21 MED ORDER — IBUPROFEN 600 MG PO TABS: 600.0000 mg | ORAL_TABLET | Freq: Four times a day (QID) | ORAL | 0 refills | Status: DC | PRN

## 2019-06-21 MED ORDER — CYCLOBENZAPRINE HCL 10 MG PO TABS: 10.0000 mg | ORAL_TABLET | Freq: Two times a day (BID) | ORAL | 0 refills | Status: DC | PRN
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
## 2019-07-07 DIAGNOSIS — M25511 Pain in right shoulder: Secondary | ICD-10-CM | POA: Diagnosis not present

## 2019-07-07 DIAGNOSIS — M25521 Pain in right elbow: Secondary | ICD-10-CM | POA: Diagnosis not present

## 2019-07-07 DIAGNOSIS — M791 Myalgia, unspecified site: Secondary | ICD-10-CM | POA: Diagnosis not present

## 2019-07-10 DIAGNOSIS — G4733 Obstructive sleep apnea (adult) (pediatric): Secondary | ICD-10-CM | POA: Diagnosis not present

## 2019-07-13 ENCOUNTER — Ambulatory Visit: Payer: Medicare Other | Admitting: Podiatry

## 2019-07-14 ENCOUNTER — Other Ambulatory Visit: Payer: Self-pay | Admitting: Internal Medicine

## 2019-07-14 ENCOUNTER — Other Ambulatory Visit: Payer: Self-pay

## 2019-07-14 ENCOUNTER — Ambulatory Visit
Admission: RE | Admit: 2019-07-14 | Discharge: 2019-07-14 | Disposition: A | Discharge status: Home / Self Care | Payer: Medicare Other | Source: Ambulatory Visit | Attending: Internal Medicine | Admitting: Internal Medicine

## 2019-07-14 DIAGNOSIS — M25521 Pain in right elbow: Secondary | ICD-10-CM

## 2019-07-14 DIAGNOSIS — M25511 Pain in right shoulder: Secondary | ICD-10-CM

## 2019-07-14 DIAGNOSIS — M19021 Primary osteoarthritis, right elbow: Secondary | ICD-10-CM | POA: Diagnosis not present

## 2019-07-22 DIAGNOSIS — M791 Myalgia, unspecified site: Secondary | ICD-10-CM | POA: Diagnosis not present

## 2019-07-22 DIAGNOSIS — M25521 Pain in right elbow: Secondary | ICD-10-CM | POA: Diagnosis not present

## 2019-07-22 DIAGNOSIS — M1712 Unilateral primary osteoarthritis, left knee: Secondary | ICD-10-CM | POA: Diagnosis not present

## 2019-07-22 DIAGNOSIS — M25511 Pain in right shoulder: Secondary | ICD-10-CM | POA: Diagnosis not present

## 2019-07-28 ENCOUNTER — Other Ambulatory Visit: Payer: Self-pay

## 2019-07-28 ENCOUNTER — Ambulatory Visit: Payer: Medicare Other | Admitting: Orthotics

## 2019-07-28 DIAGNOSIS — M216X9 Other acquired deformities of unspecified foot: Secondary | ICD-10-CM | POA: Diagnosis not present

## 2019-07-28 DIAGNOSIS — E119 Type 2 diabetes mellitus without complications: Secondary | ICD-10-CM | POA: Diagnosis not present

## 2019-07-28 DIAGNOSIS — L84 Corns and callosities: Secondary | ICD-10-CM | POA: Diagnosis not present

## 2019-08-04 ENCOUNTER — Ambulatory Visit (INDEPENDENT_AMBULATORY_CARE_PROVIDER_SITE_OTHER): Payer: Medicare Other | Admitting: Neurology

## 2019-08-04 ENCOUNTER — Ambulatory Visit: Payer: Medicare Other | Admitting: Podiatry

## 2019-08-04 ENCOUNTER — Telehealth: Payer: Self-pay | Admitting: Neurology

## 2019-08-04 ENCOUNTER — Encounter: Payer: Self-pay | Admitting: Neurology

## 2019-08-04 VITALS — BP 152/73 | HR 60 | Ht 65.0 in | Wt 255.5 lb

## 2019-08-04 DIAGNOSIS — R251 Tremor, unspecified: Secondary | ICD-10-CM

## 2019-08-04 DIAGNOSIS — Z8673 Personal history of transient ischemic attack (TIA), and cerebral infarction without residual deficits: Secondary | ICD-10-CM

## 2019-08-04 DIAGNOSIS — F439 Reaction to severe stress, unspecified: Secondary | ICD-10-CM

## 2019-08-04 NOTE — Telephone Encounter (Signed)
[employer] medicare/medicaid order sent to GI. No auth they will reach out to the patient to schedule.  °
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
## 2019-08-04 NOTE — Patient Instructions (Addendum)
You have an intermittent tremor of both hands.  Currently you do not have any significant trauma thankfully.  Your neurological exam overall looks good today. I do not see any signs or symptoms of parkinson's like disease or what we call parkinsonism.   For your tremor, I would not recommend any new medication, and I would like to monitor your exam.  We will make a follow-up appointment in about 3 to 4 months.   Please remember, that any kind of tremor may be exacerbated by anxiety, anger, nervousness, excitement, dehydration, sleep deprivation, by caffeine, thyroid dysfunction, and low blood sugar values or blood sugar fluctuations.  You do endorse quite a bit of stress currently and have also not been sleeping well, have not used her CPAP on a consistent basis in the past 2 months or so.  Please try to get back on track with your healthy sleep habits and using her CPAP nightly.  Try to hydrate well with water.   As discussed, we will proceed with a brain MRI with and without contrast to rule out a structural cause of your tremors.  We will call you with the results.

## 2019-08-05 ENCOUNTER — Encounter: Payer: Self-pay | Admitting: Podiatry

## 2019-08-05 ENCOUNTER — Ambulatory Visit (INDEPENDENT_AMBULATORY_CARE_PROVIDER_SITE_OTHER): Payer: Medicare Other | Admitting: Podiatry

## 2019-08-05 ENCOUNTER — Other Ambulatory Visit: Payer: Self-pay

## 2019-08-05 DIAGNOSIS — B351 Tinea unguium: Secondary | ICD-10-CM

## 2019-08-05 DIAGNOSIS — E119 Type 2 diabetes mellitus without complications: Secondary | ICD-10-CM | POA: Diagnosis not present

## 2019-08-05 DIAGNOSIS — M79675 Pain in left toe(s): Secondary | ICD-10-CM

## 2019-08-05 DIAGNOSIS — M79674 Pain in right toe(s): Secondary | ICD-10-CM

## 2019-08-05 LAB — COMPREHENSIVE METABOLIC PANEL
ALT: 16 IU/L (ref 0–32)
AST: 14 IU/L (ref 0–40)
Albumin/Globulin Ratio: 1.3 (ref 1.2–2.2)
Albumin: 4.1 g/dL (ref 3.8–4.8)
Alkaline Phosphatase: 86 IU/L (ref 48–121)
BUN/Creatinine Ratio: 19 (ref 12–28)
BUN: 15 mg/dL (ref 8–27)
Bilirubin Total: 0.3 mg/dL (ref 0.0–1.2)
CO2: 28 mmol/L (ref 20–29)
Calcium: 9.7 mg/dL (ref 8.7–10.3)
Chloride: 103 mmol/L (ref 96–106)
Creatinine, Ser: 0.79 mg/dL (ref 0.57–1.00)
GFR calc Af Amer: 91 mL/min/{1.73_m2} (ref >59)
GFR calc non Af Amer: 79 mL/min/{1.73_m2} (ref >59)
Globulin, Total: 3.1 g/dL (ref 1.5–4.5)
Glucose: 100 mg/dL — ABNORMAL HIGH (ref 65–99)
Potassium: 4.1 mmol/L (ref 3.5–5.2)
Sodium: 141 mmol/L (ref 134–144)
Total Protein: 7.2 g/dL (ref 6.0–8.5)

## 2019-08-05 NOTE — Patient Instructions (Signed)
Diabetes Mellitus and Foot Care Foot care is an important part of your health, especially when you have diabetes. Diabetes may cause you to have problems because of poor blood flow (circulation) to your feet and legs, which can cause your skin to:  Become thinner and drier.  Break more easily.  Heal more slowly.  Peel and crack. You may also have nerve damage (neuropathy) in your legs and feet, causing decreased feeling in them. This means that you may not notice minor injuries to your feet that could lead to more serious problems. Noticing and addressing any potential problems early is the best way to prevent future foot problems. How to care for your feet Foot hygiene  Wash your feet daily with warm water and mild soap. Do not use hot water. Then, pat your feet and the areas between your toes until they are completely dry. Do not soak your feet as this can dry your skin.  Trim your toenails straight across. Do not dig under them or around the cuticle. File the edges of your nails with an emery board or nail file.  Apply a moisturizing lotion or petroleum jelly to the skin on your feet and to dry, brittle toenails. Use lotion that does not contain alcohol and is unscented. Do not apply lotion between your toes. Shoes and socks  Wear clean socks or stockings every day. Make sure they are not too tight. Do not wear knee-high stockings since they may decrease blood flow to your legs.  Wear shoes that fit properly and have enough cushioning. Always look in your shoes before you put them on to be sure there are no objects inside.  To break in new shoes, wear them for just a few hours a day. This prevents injuries on your feet. Wounds, scrapes, corns, and calluses  Check your feet daily for blisters, cuts, bruises, sores, and redness. If you cannot see the bottom of your feet, use a mirror or ask someone for help.  Do not cut corns or calluses or try to remove them with medicine.  If you  find a minor scrape, cut, or break in the skin on your feet, keep it and the skin around it clean and dry. You may clean these areas with mild soap and water. Do not clean the area with peroxide, alcohol, or iodine.  If you have a wound, scrape, corn, or callus on your foot, look at it several times a day to make sure it is healing and not infected. Check for: ? Redness, swelling, or pain. ? Fluid or blood. ? Warmth. ? Pus or a bad smell. General instructions  Do not cross your legs. This may decrease blood flow to your feet.  Do not use heating pads or hot water bottles on your feet. They may burn your skin. If you have lost feeling in your feet or legs, you may not know this is happening until it is too late.  Protect your feet from hot and cold by wearing shoes, such as at the beach or on hot pavement.  Schedule a complete foot exam at least once a year (annually) or more often if you have foot problems. If you have foot problems, report any cuts, sores, or bruises to your health care provider immediately. Contact a health care provider if:  You have a medical condition that increases your risk of infection and you have any cuts, sores, or bruises on your feet.  You have an injury that is not   healing.  You have redness on your legs or feet.  You feel burning or tingling in your legs or feet.  You have pain or cramps in your legs and feet.  Your legs or feet are numb.  Your feet always feel cold.  You have pain around a toenail. Get help right away if:  You have a wound, scrape, corn, or callus on your foot and: ? You have pain, swelling, or redness that gets worse. ? You have fluid or blood coming from the wound, scrape, corn, or callus. ? Your wound, scrape, corn, or callus feels warm to the touch. ? You have pus or a bad smell coming from the wound, scrape, corn, or callus. ? You have a fever. ? You have a red line going up your leg. Summary  Check your feet every day  for cuts, sores, red spots, swelling, and blisters.  Moisturize feet and legs daily.  Wear shoes that fit properly and have enough cushioning.  If you have foot problems, report any cuts, sores, or bruises to your health care provider immediately.  Schedule a complete foot exam at least once a year (annually) or more often if you have foot problems. This information is not intended to replace advice given to you by your health care provider. Make sure you discuss any questions you have with your health care provider. Document Revised: 10/21/2018 Document Reviewed: 03/01/2016 Elsevier Patient Education  2020 [employer]  

## 2019-08-10 DIAGNOSIS — G4733 Obstructive sleep apnea (adult) (pediatric): Secondary | ICD-10-CM | POA: Diagnosis not present

## 2019-08-11 DIAGNOSIS — E1165 Type 2 diabetes mellitus with hyperglycemia: Secondary | ICD-10-CM | POA: Diagnosis not present

## 2019-08-11 NOTE — Progress Notes (Signed)
Subjective: Tiffany Strickland is a 64 y.o. female patient seen today painful mycotic nails b/l that are difficult to trim. Pain interferes with ambulation. Aggravating factors include wearing enclosed shoe gear. Pain is relieved with periodic professional debridement.  Past Medical History:  Diagnosis Date  . Colon polyp    adenomatous  . Diabetes mellitus without complication ([place])   . Hyperlipidemia   . Hypertension   . Stroke ([place])   . Thyroid disease     Patient Active Problem List   Diagnosis Date Noted  . Pulmonary HTN ([place]) 06/29/2018  . Educated about COVID-19 virus infection 06/29/2018  . OSA (obstructive sleep apnea) 02/03/2017  . Hypothyroidism 02/02/2017  . Chest pain 02/02/2017  . Morbid obesity ([place]) 01/27/2017  . Plantar fasciitis of left foot 11/08/2015  . Non-insulin treated type 2 diabetes mellitus ([place]) 06/08/2015  . Colon cancer screening 06/08/2015  . Bilateral leg edema 12/15/2014  . Blurry vision, bilateral 06/16/2014  . Breast cancer screening 06/16/2014  . Other specified hypothyroidism 06/16/2014  . Other seasonal allergic rhinitis 06/16/2014  . History of stroke 01/31/2014  . Rectal nodule 09/06/2013  . Essential hypertension 08/24/2013  . Nonspecific (abnormal) findings on radiological and other examination of gastrointestinal tract 08/12/2013  . Lateral epicondylitis of right elbow 07/21/2013  . Right shoulder pain 07/21/2013  . Sprain of wrist, right 07/21/2013  . Unspecified constipation 04/15/2013  . Rectal bleeding 04/15/2013    Current Outpatient Medications on File Prior to Visit  Medication Sig Dispense Refill  . amLODipine (NORVASC) 2.5 MG tablet Take 5 mg by mouth daily.     . Ascorbic Acid (VITAMIN C) 1000 MG tablet Take 1,000 mg by mouth daily.    Marland Kitchen aspirin (ASPIR-81) 81 MG EC tablet Take 1 tablet (81 mg total) by mouth daily. Swallow whole. 30 tablet 12  . Blood Glucose Monitoring Suppl (RELION PRIME MONITOR) DEVI Use as directed  once daily 1 each 0  . Blood Glucose Monitoring Suppl (TRUE METRIX METER) DEVI 1 each by Does not apply route daily before breakfast. 1 Device 0  . famotidine (PEPCID) 40 MG tablet Take 40 mg by mouth 2 (two) times daily.    Marland Kitchen glucose blood (RELION PRIME TEST) test strip Use as instructed once daily 100 each 12  . hydrochlorothiazide (HYDRODIURIL) 25 MG tablet Take 1 tablet (25 mg total) by mouth daily. 30 tablet 6  . ibuprofen (ADVIL) 600 MG tablet Take 1 tablet (600 mg total) by mouth every 6 (six) hours as needed. 30 tablet 0  . isosorbide mononitrate (IMDUR) 60 MG 24 hr tablet Take 1 tablet (60 mg total) by mouth daily. 30 tablet 6  . levothyroxine (EUTHYROX) 150 MCG tablet Take 1 tablet (150 mcg total) by mouth daily before breakfast. Must have office visit for refills. 30 tablet 0  . losartan (COZAAR) 100 MG tablet Take 1 tablet (100 mg total) by mouth daily. 30 tablet 6  . Multiple Vitamin (MULTIVITAMIN WITH MINERALS) TABS tablet Take 1 tablet by mouth daily.    . naproxen sodium (ALEVE) 220 MG tablet Take 220 mg by mouth as needed (headache/pain).    . nitroGLYCERIN (NITROSTAT) 0.4 MG SL tablet Place 1 tablet (0.4 mg total) under the tongue every 5 (five) minutes as needed for chest pain. 20 tablet 0  . nystatin (MYCOSTATIN/NYSTOP) powder Apply 1 application topically 3 (three) times daily. 15 g 0  . ReliOn Lancets Micro-Thin 33G MISC Use as instructed once daily 100 each 12  . rosuvastatin (  CRESTOR) 10 MG tablet Take 10 mg by mouth every evening.     No current facility-administered medications on file prior to visit.    Allergies  Allergen Reactions  . Pollen Extract Other (See Comments)    Seasonal allergies  . Tomato Itching  . Wool Alcohol [Lanolin] Itching    Reaction unknown    Objective: Physical Exam  General: Patient is a pleasant 64 y.o. African American female obese in NAD. AAO x 3.   Neurovascular Examination: Neurovascular status unchanged b/l lower  extremities. Capillary refill time to digits immediate b/l. Palpable pedal pulses b/l LE. Pedal hair present. Lower extremity skin temperature gradient within normal limits. No pain with calf compression b/l. +1 pitting edema b/l lower extremities. Varicosities present b/l.   Protective sensation intact 5/5 intact bilaterally with 10g monofilament b/l. Vibratory sensation intact b/l. Proprioception intact bilaterally. Babinski reflex negative b/l. Clonus negative b/l.  Dermatological:  Pedal skin with normal turgor, texture and tone bilaterally. No open wounds bilaterally. No interdigital macerations bilaterally. Toenails 1-5 b/l elongated, discolored, dystrophic, thickened, crumbly with subungual debris and tenderness to dorsal palpation.  Musculoskeletal:  Normal muscle strength 5/5 to all lower extremity muscle groups bilaterally. No pain crepitus or joint limitation noted with ROM b/l. Hallux valgus with bunion deformity noted b/l lower extremities.  Assessment and Plan:  1. Pain due to onychomycosis of toenails of both feet   2. Controlled type 2 diabetes mellitus without complication, without long-term current use of insulin ([place])    -Examined patient. -No new findings. No new orders. -Continue diabetic foot care principles. -Toenails 1-5 b/l were debrided in length and girth with sterile nail nippers and dremel without iatrogenic bleeding.  -Patient to report any pedal injuries to medical professional immediately. -Patient to continue soft, supportive shoe gear daily. -Patient/POA to call should there be question/concern in the interim.  Return in about 3 months (around 11/05/2019) for nail and callus trim.  Marzetta Board, DPM

## 2019-08-12 ENCOUNTER — Ambulatory Visit
Admission: RE | Admit: 2019-08-12 | Discharge: 2019-08-12 | Disposition: A | Discharge status: Home / Self Care | Payer: Medicare Other | Source: Ambulatory Visit | Attending: Neurology | Admitting: Neurology

## 2019-08-12 DIAGNOSIS — R251 Tremor, unspecified: Secondary | ICD-10-CM

## 2019-08-12 DIAGNOSIS — F439 Reaction to severe stress, unspecified: Secondary | ICD-10-CM

## 2019-08-12 DIAGNOSIS — Z8673 Personal history of transient ischemic attack (TIA), and cerebral infarction without residual deficits: Secondary | ICD-10-CM

## 2019-09-06 ENCOUNTER — Telehealth: Payer: Self-pay | Admitting: Neurology

## 2019-09-06 NOTE — Telephone Encounter (Signed)
Pt has called to report that her MRI is this coming Saturday and she would like something called into [facility] to help her relax

## 2019-09-07 MED ORDER — ALPRAZOLAM 1 MG PO TABS: ORAL_TABLET | ORAL | 0 refills | Status: DC

## 2019-09-07 NOTE — Telephone Encounter (Signed)
I called the pt and advised Rx has been sent for Xanax. Pt advised on how to take med and that she must have a driver.

## 2019-09-07 NOTE — Telephone Encounter (Signed)
Meds ordered this encounter  Medications   ALPRAZolam (XANAX) 1 MG tablet    Sig: Take 1-2 tablets 30 minutes prior to MRI, may repeat once as needed. Must have driver.    Dispense:  3 tablet    Refill:  0     

## 2019-09-07 NOTE — Addendum Note (Signed)
Addended by: Marcial Pacas on: 09/07/2019 11:10 AM   Modules accepted: Orders

## 2019-09-08 DIAGNOSIS — I639 Cerebral infarction, unspecified: Secondary | ICD-10-CM | POA: Diagnosis not present

## 2019-09-08 DIAGNOSIS — I10 Essential (primary) hypertension: Secondary | ICD-10-CM | POA: Diagnosis not present

## 2019-09-08 DIAGNOSIS — E119 Type 2 diabetes mellitus without complications: Secondary | ICD-10-CM | POA: Diagnosis not present

## 2019-09-08 DIAGNOSIS — E039 Hypothyroidism, unspecified: Secondary | ICD-10-CM | POA: Diagnosis not present

## 2019-09-08 DIAGNOSIS — I208 Other forms of angina pectoris: Secondary | ICD-10-CM | POA: Diagnosis not present

## 2019-09-08 DIAGNOSIS — E785 Hyperlipidemia, unspecified: Secondary | ICD-10-CM | POA: Diagnosis not present

## 2019-09-09 DIAGNOSIS — G4733 Obstructive sleep apnea (adult) (pediatric): Secondary | ICD-10-CM | POA: Diagnosis not present

## 2019-09-11 ENCOUNTER — Ambulatory Visit
Admission: RE | Admit: 2019-09-11 | Discharge: 2019-09-11 | Disposition: A | Discharge status: Home / Self Care | Payer: Medicare Other | Source: Ambulatory Visit | Attending: Neurology | Admitting: Neurology

## 2019-09-11 DIAGNOSIS — R251 Tremor, unspecified: Secondary | ICD-10-CM | POA: Diagnosis not present

## 2019-09-11 MED ORDER — GADOBENATE DIMEGLUMINE 529 MG/ML IV SOLN
20.0000 mL | Freq: Once | INTRAVENOUS | Status: AC | PRN
  Administered 2019-09-11: 20 mL via INTRAVENOUS

## 2019-09-12 DEATH — deceased

## 2019-09-13 ENCOUNTER — Telehealth: Payer: Self-pay

## 2019-09-13 NOTE — Progress Notes (Signed)
Unremarkable MRI scan of the brain with and without contrast showing only minor changes of chronic small vessel disease. No evidence of stroke.

## 2019-09-13 NOTE — Telephone Encounter (Signed)
-----   Message from Larey Seat, MD sent at 09/13/2019  4:21 PM EDT ----- Unremarkable MRI scan of the brain with and without contrast showing only minor changes of chronic small vessel disease. No evidence of stroke.

## 2019-09-13 NOTE — Telephone Encounter (Signed)
Pt notified of result and verbalized understanding.Pt will keep up coming appt in Sept with our office.

## 2019-10-19 DIAGNOSIS — E890 Postprocedural hypothyroidism: Secondary | ICD-10-CM | POA: Diagnosis not present

## 2019-10-19 DIAGNOSIS — E785 Hyperlipidemia, unspecified: Secondary | ICD-10-CM | POA: Diagnosis not present

## 2019-10-19 DIAGNOSIS — R600 Localized edema: Secondary | ICD-10-CM | POA: Diagnosis not present

## 2019-10-19 DIAGNOSIS — E119 Type 2 diabetes mellitus without complications: Secondary | ICD-10-CM | POA: Diagnosis not present

## 2019-10-19 DIAGNOSIS — I10 Essential (primary) hypertension: Secondary | ICD-10-CM | POA: Diagnosis not present

## 2019-10-31 DIAGNOSIS — H2513 Age-related nuclear cataract, bilateral: Secondary | ICD-10-CM | POA: Diagnosis not present

## 2019-10-31 DIAGNOSIS — E119 Type 2 diabetes mellitus without complications: Secondary | ICD-10-CM | POA: Diagnosis not present

## 2019-11-01 ENCOUNTER — Ambulatory Visit: Payer: Medicare Other | Admitting: Neurology

## 2019-11-02 ENCOUNTER — Encounter: Payer: Self-pay | Admitting: Neurology

## 2019-11-02 ENCOUNTER — Ambulatory Visit (INDEPENDENT_AMBULATORY_CARE_PROVIDER_SITE_OTHER): Payer: Medicare Other | Admitting: Neurology

## 2019-11-02 VITALS — BP 128/64 | HR 78 | Ht 65.0 in | Wt 264.3 lb

## 2019-11-02 DIAGNOSIS — R251 Tremor, unspecified: Secondary | ICD-10-CM

## 2019-11-02 NOTE — Patient Instructions (Addendum)
Your your exam is stable, no obvious tremor at this time.  Please continue to hydrate well with water.  I do believe that stress could be a trigger for your tremors.  I do not see any signs of Parkinson's disease thankfully.  MRI brain was also benign and did not show any acute findings thankfully.  I would be happy to see you back as needed.
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
## 2019-11-03 ENCOUNTER — Ambulatory Visit: Payer: Medicare Other | Admitting: Podiatry

## 2019-11-09 DIAGNOSIS — E119 Type 2 diabetes mellitus without complications: Secondary | ICD-10-CM | POA: Diagnosis not present

## 2019-11-09 DIAGNOSIS — R600 Localized edema: Secondary | ICD-10-CM | POA: Diagnosis not present

## 2019-11-09 DIAGNOSIS — E890 Postprocedural hypothyroidism: Secondary | ICD-10-CM | POA: Diagnosis not present

## 2019-11-09 DIAGNOSIS — L03119 Cellulitis of unspecified part of limb: Secondary | ICD-10-CM | POA: Diagnosis not present

## 2019-11-09 DIAGNOSIS — E785 Hyperlipidemia, unspecified: Secondary | ICD-10-CM | POA: Diagnosis not present

## 2019-11-09 DIAGNOSIS — I10 Essential (primary) hypertension: Secondary | ICD-10-CM | POA: Diagnosis not present

## 2019-11-09 DIAGNOSIS — I872 Venous insufficiency (chronic) (peripheral): Secondary | ICD-10-CM | POA: Diagnosis not present

## 2019-11-12 ENCOUNTER — Ambulatory Visit: Payer: Medicare Other | Admitting: Podiatry

## 2019-12-07 ENCOUNTER — Ambulatory Visit (INDEPENDENT_AMBULATORY_CARE_PROVIDER_SITE_OTHER): Payer: Medicare Other | Admitting: Podiatry

## 2019-12-07 ENCOUNTER — Encounter: Payer: Self-pay | Admitting: Podiatry

## 2019-12-07 ENCOUNTER — Other Ambulatory Visit: Payer: Self-pay

## 2019-12-07 DIAGNOSIS — L84 Corns and callosities: Secondary | ICD-10-CM

## 2019-12-07 DIAGNOSIS — M2012 Hallux valgus (acquired), left foot: Secondary | ICD-10-CM

## 2019-12-07 DIAGNOSIS — E119 Type 2 diabetes mellitus without complications: Secondary | ICD-10-CM | POA: Diagnosis not present

## 2019-12-07 DIAGNOSIS — M792 Neuralgia and neuritis, unspecified: Secondary | ICD-10-CM

## 2019-12-07 DIAGNOSIS — M79675 Pain in left toe(s): Secondary | ICD-10-CM

## 2019-12-07 DIAGNOSIS — M79674 Pain in right toe(s): Secondary | ICD-10-CM | POA: Diagnosis not present

## 2019-12-07 DIAGNOSIS — M2011 Hallux valgus (acquired), right foot: Secondary | ICD-10-CM

## 2019-12-07 DIAGNOSIS — B351 Tinea unguium: Secondary | ICD-10-CM | POA: Diagnosis not present

## 2019-12-07 NOTE — Progress Notes (Signed)
ANNUAL DIABETIC FOOT EXAM  Subjective: Tiffany Strickland presents today for for annual diabetic foot examination.  Patient relates 5 year h/o diabetes.  Patient denies h/o foot wounds.  Patient relates positive symptoms of right great toe numbness, sometimes all toes.  Patient denies symptoms of foot tingling.  Patient relates occasional symptoms of burning in feet.  Patient's blood sugar was 114 mg/dl yesterday. She did not check blood glucose this morning.  Audley Hose, MD is patient's PCP. Last visit was 08/11/2019.  Past Medical History:  Diagnosis Date  . Colon polyp    adenomatous  . Diabetes mellitus without complication ([place])   . Hyperlipidemia   . Hypertension   . Stroke ([place])   . Thyroid disease     Patient Active Problem List   Diagnosis Date Noted  . Pulmonary HTN ([place]) 06/29/2018  . Educated about COVID-19 virus infection 06/29/2018  . OSA (obstructive sleep apnea) 02/03/2017  . Hypothyroidism 02/02/2017  . Chest pain 02/02/2017  . Morbid obesity ([place]) 01/27/2017  . Plantar fasciitis of left foot 11/08/2015  . Non-insulin treated type 2 diabetes mellitus ([place]) 06/08/2015  . Colon cancer screening 06/08/2015  . Bilateral leg edema 12/15/2014  . Blurry vision, bilateral 06/16/2014  . Breast cancer screening 06/16/2014  . Other specified hypothyroidism 06/16/2014  . Other seasonal allergic rhinitis 06/16/2014  . History of stroke 01/31/2014  . Rectal nodule 09/06/2013  . Essential hypertension 08/24/2013  . Nonspecific (abnormal) findings on radiological and other examination of gastrointestinal tract 08/12/2013  . Lateral epicondylitis of right elbow 07/21/2013  . Right shoulder pain 07/21/2013  . Sprain of wrist, right 07/21/2013  . Unspecified constipation 04/15/2013  . Rectal bleeding 04/15/2013    Past Surgical History:  Procedure Laterality Date  . ABDOMINAL HYSTERECTOMY    . BLADDER SUSPENSION    . EUS N/A 08/12/2013   Procedure: LOWER  ENDOSCOPIC ULTRASOUND (EUS);  Surgeon: Milus Banister, MD;  Location: [facility];  Service: Endoscopy;  Laterality: N/A;  . KNEE SURGERY    . TUBAL LIGATION      Current Outpatient Medications on File Prior to Visit  Medication Sig Dispense Refill  . amLODipine (NORVASC) 2.5 MG tablet Take 5 mg by mouth daily.     Marland Kitchen amLODipine (NORVASC) 5 MG tablet Take 5 mg by mouth daily.    . Ascorbic Acid (VITAMIN C) 1000 MG tablet Take 1,000 mg by mouth daily.    Marland Kitchen aspirin (ASPIR-81) 81 MG EC tablet Take 1 tablet (81 mg total) by mouth daily. Swallow whole. 30 tablet 12  . Blood Glucose Monitoring Suppl (RELION PRIME MONITOR) DEVI Use as directed once daily 1 each 0  . Blood Glucose Monitoring Suppl (TRUE METRIX METER) DEVI 1 each by Does not apply route daily before breakfast. 1 Device 0  . cephALEXin (KEFLEX) 500 MG capsule Take 500 mg by mouth 4 (four) times daily.    . cetirizine (ZYRTEC) 10 MG tablet Take 10 mg by mouth daily.    . diclofenac Sodium (VOLTAREN) 1 % GEL Apply 2 g topically 4 (four) times daily.    . famotidine (PEPCID) 40 MG tablet Take 40 mg by mouth 2 (two) times daily.    . furosemide (LASIX) 20 MG tablet Take 20 mg by mouth daily.    Marland Kitchen glucose blood (RELION PRIME TEST) test strip Use as instructed once daily 100 each 12  . hydrochlorothiazide (HYDRODIURIL) 25 MG tablet Take 1 tablet (25 mg total) by mouth daily. 30 tablet  6  . ibuprofen (ADVIL) 600 MG tablet Take 1 tablet (600 mg total) by mouth every 6 (six) hours as needed. 30 tablet 0  . levothyroxine (EUTHYROX) 150 MCG tablet Take 1 tablet (150 mcg total) by mouth daily before breakfast. Must have office visit for refills. 30 tablet 0  . losartan (COZAAR) 100 MG tablet Take 1 tablet (100 mg total) by mouth daily. 30 tablet 6  . methocarbamol (ROBAXIN) 500 MG tablet Take 1,000 mg by mouth 2 (two) times daily as needed.    . montelukast (SINGULAIR) 10 MG tablet Take 10 mg by mouth daily.    . Multiple Vitamin (MULTIVITAMIN  WITH MINERALS) TABS tablet Take 1 tablet by mouth daily.    . naproxen sodium (ALEVE) 220 MG tablet Take 220 mg by mouth as needed (headache/pain).    . nitroGLYCERIN (NITROSTAT) 0.4 MG SL tablet Place 1 tablet (0.4 mg total) under the tongue every 5 (five) minutes as needed for chest pain. 20 tablet 0  . nystatin (MYCOSTATIN/NYSTOP) powder Apply 1 application topically 3 (three) times daily. 15 g 0  . ReliOn Lancets Micro-Thin 33G MISC Use as instructed once daily 100 each 12  . rosuvastatin (CRESTOR) 20 MG tablet Take 20 mg by mouth at bedtime.    . isosorbide mononitrate (IMDUR) 60 MG 24 hr tablet Take 1 tablet (60 mg total) by mouth daily. 30 tablet 6   No current facility-administered medications on file prior to visit.     Allergies  Allergen Reactions  . Pollen Extract Other (See Comments)    Seasonal allergies  . Tomato Itching  . Wool Alcohol [Lanolin] Itching    Reaction unknown    Social History   Occupational History  . Not on file  Tobacco Use  . Smoking status: Never Smoker  . Smokeless tobacco: Never Used  Vaping Use  . Vaping Use: Never used  Substance and Sexual Activity  . Alcohol use: No  . Drug use: No  . Sexual activity: Not Currently    Partners: Male    Birth control/protection: Surgical    Family History  Problem Relation Age of Onset  . Hypertension Mother   . Heart disease Maternal Grandmother   . Cancer Maternal Grandmother        ovarian  . Cancer Maternal Grandfather   . Hypertension Other   . Hyperlipidemia Other   . Cancer Other   . Sleep apnea Other   . Obesity Other     Immunization History  Administered Date(s) Administered  . Influenza Split 12/11/2012  . Influenza,inj,Quad PF,6+ Mos 12/11/2012, 01/31/2014, 11/08/2015, 01/27/2017, 11/06/2017  . Pneumococcal Polysaccharide-23 01/27/2017     Objective: There were no vitals filed for this visit.  Tiffany Strickland is a pleasant 64 y.o. African American female in NAD. AAO X  3.  Vascular Examination: Capillary refill time to digits immediate b/l. Palpable pedal pulses b/l LE. Pedal hair present. Lower extremity skin temperature gradient within normal limits. No pain with calf compression b/l. +1 pitting edema b/l lower extremities.  Dermatological Examination: Pedal skin with normal turgor, texture and tone bilaterally. No open wounds bilaterally. No interdigital macerations bilaterally. Toenails 1-5 b/l elongated, discolored, dystrophic, thickened, crumbly with subungual debris and tenderness to dorsal palpation. Hyperkeratotic lesion(s) L hallux and R hallux.  No erythema, no edema, no drainage, no flocculence.  Musculoskeletal Examination: Normal muscle strength 5/5 to all lower extremity muscle groups bilaterally. No pain crepitus or joint limitation noted with ROM b/l. Hallux valgus with  bunion deformity noted b/l lower extremities.  Footwear Assessment: Does the patient wear appropriate shoes? Yes. Does the patient need inserts/orthotics? No.  Neurological Examination: Pt has subjective symptoms of neuropathy. Protective sensation intact 5/5 intact bilaterally with 10g monofilament b/l. Vibratory sensation intact b/l. Clonus negative b/l.   Last A1c was 7.3%.  Assessment: 1. Pain due to onychomycosis of toenails of both feet   2. Callus   3. Neuropathic pain   4. Hallux valgus, acquired, bilateral   5. Controlled type 2 diabetes mellitus without complication, without long-term current use of insulin ([place])   6. Encounter for diabetic foot exam ([place])      ADA Risk Categorization: Low Risk :  Patient has all of the following: Intact protective sensation No prior foot ulcer  No severe deformity Pedal pulses present  Plan: -Examined patient. -Diabetic foot examination performed on today's visit. -Continue diabetic foot care principles. -Toenails 1-5 b/l were debrided in length and girth with sterile nail nippers and dremel without iatrogenic  bleeding.  -Callus(es) L hallux and R hallux pared utilizing sterile scalpel blade without complication or incident. Total number debrided =2. -Patient to report any pedal injuries to medical professional immediately. -Patient to continue soft, supportive shoe gear daily. -Patient/POA to call should there be question/concern in the interim.  Return in about 3 months (around 03/08/2020) for diabetic toenails, corn(s)/callus(es).  Marzetta Board, DPM

## 2019-12-08 DIAGNOSIS — I208 Other forms of angina pectoris: Secondary | ICD-10-CM | POA: Diagnosis not present

## 2019-12-08 DIAGNOSIS — I10 Essential (primary) hypertension: Secondary | ICD-10-CM | POA: Diagnosis not present

## 2019-12-08 DIAGNOSIS — I639 Cerebral infarction, unspecified: Secondary | ICD-10-CM | POA: Diagnosis not present

## 2019-12-08 DIAGNOSIS — E119 Type 2 diabetes mellitus without complications: Secondary | ICD-10-CM | POA: Diagnosis not present

## 2019-12-08 DIAGNOSIS — E785 Hyperlipidemia, unspecified: Secondary | ICD-10-CM | POA: Diagnosis not present

## 2019-12-22 ENCOUNTER — Ambulatory Visit: Payer: Medicare Other | Admitting: Podiatry

## 2020-01-14 DIAGNOSIS — I87399 Chronic venous hypertension (idiopathic) with other complications of unspecified lower extremity: Secondary | ICD-10-CM | POA: Diagnosis not present

## 2020-01-14 DIAGNOSIS — R0981 Nasal congestion: Secondary | ICD-10-CM | POA: Diagnosis not present

## 2020-01-14 DIAGNOSIS — R200 Anesthesia of skin: Secondary | ICD-10-CM | POA: Diagnosis not present

## 2020-01-14 DIAGNOSIS — E1165 Type 2 diabetes mellitus with hyperglycemia: Secondary | ICD-10-CM | POA: Diagnosis not present

## 2020-02-02 ENCOUNTER — Other Ambulatory Visit: Payer: Self-pay | Admitting: Internal Medicine

## 2020-02-02 DIAGNOSIS — Z1211 Encounter for screening for malignant neoplasm of colon: Secondary | ICD-10-CM | POA: Diagnosis not present

## 2020-02-02 DIAGNOSIS — Z13820 Encounter for screening for osteoporosis: Secondary | ICD-10-CM | POA: Diagnosis not present

## 2020-02-02 DIAGNOSIS — Z0001 Encounter for general adult medical examination with abnormal findings: Secondary | ICD-10-CM | POA: Diagnosis not present

## 2020-02-02 DIAGNOSIS — Z23 Encounter for immunization: Secondary | ICD-10-CM | POA: Diagnosis not present

## 2020-02-02 DIAGNOSIS — Z1231 Encounter for screening mammogram for malignant neoplasm of breast: Secondary | ICD-10-CM

## 2020-02-02 DIAGNOSIS — E1165 Type 2 diabetes mellitus with hyperglycemia: Secondary | ICD-10-CM | POA: Diagnosis not present

## 2020-02-02 DIAGNOSIS — I447 Left bundle-branch block, unspecified: Secondary | ICD-10-CM | POA: Diagnosis not present

## 2020-02-03 DIAGNOSIS — E119 Type 2 diabetes mellitus without complications: Secondary | ICD-10-CM | POA: Diagnosis not present

## 2020-02-03 DIAGNOSIS — I10 Essential (primary) hypertension: Secondary | ICD-10-CM | POA: Diagnosis not present

## 2020-02-03 DIAGNOSIS — I447 Left bundle-branch block, unspecified: Secondary | ICD-10-CM | POA: Diagnosis not present

## 2020-02-03 DIAGNOSIS — I639 Cerebral infarction, unspecified: Secondary | ICD-10-CM | POA: Diagnosis not present

## 2020-02-03 DIAGNOSIS — I208 Other forms of angina pectoris: Secondary | ICD-10-CM | POA: Diagnosis not present

## 2020-02-07 ENCOUNTER — Other Ambulatory Visit: Payer: Self-pay | Admitting: Internal Medicine

## 2020-02-07 DIAGNOSIS — M79661 Pain in right lower leg: Secondary | ICD-10-CM

## 2020-02-07 DIAGNOSIS — M79662 Pain in left lower leg: Secondary | ICD-10-CM

## 2020-02-08 ENCOUNTER — Other Ambulatory Visit: Payer: Self-pay

## 2020-02-08 ENCOUNTER — Ambulatory Visit (HOSPITAL_COMMUNITY)
Admission: RE | Admit: 2020-02-08 | Discharge: 2020-02-08 | Disposition: A | Discharge status: Home / Self Care | Payer: Medicare Other | Source: Ambulatory Visit | Attending: Cardiology | Admitting: Cardiology

## 2020-02-08 DIAGNOSIS — M79662 Pain in left lower leg: Secondary | ICD-10-CM

## 2020-02-08 DIAGNOSIS — M79661 Pain in right lower leg: Secondary | ICD-10-CM

## 2020-02-22 DIAGNOSIS — Z20822 Contact with and (suspected) exposure to covid-19: Secondary | ICD-10-CM | POA: Diagnosis not present

## 2020-02-25 ENCOUNTER — Other Ambulatory Visit: Payer: Self-pay

## 2020-02-25 ENCOUNTER — Other Ambulatory Visit: Payer: Medicare Other

## 2020-02-25 DIAGNOSIS — Z20822 Contact with and (suspected) exposure to covid-19: Secondary | ICD-10-CM | POA: Diagnosis not present

## 2020-02-27 LAB — SARS-COV-2, NAA 2 DAY TAT

## 2020-02-27 LAB — NOVEL CORONAVIRUS, NAA: SARS-CoV-2, NAA: DETECTED — AB

## 2020-02-28 ENCOUNTER — Telehealth: Payer: Self-pay | Admitting: Oncology

## 2020-02-28 NOTE — Telephone Encounter (Signed)
Called to Discuss with patient about Covid symptoms and the use of the monoclonal antibody infusion for those with mild to moderate Covid symptoms and at a high risk of hospitalization.     Pt is qualified for this infusion due to co-morbid conditions and/or a member of an at-risk group.     Patient Active Problem List   Diagnosis Date Noted  . Pulmonary HTN ([place]) 06/29/2018  . Educated about COVID-19 virus infection 06/29/2018  . OSA (obstructive sleep apnea) 02/03/2017  . Hypothyroidism 02/02/2017  . Chest pain 02/02/2017  . Morbid obesity ([place]) 01/27/2017  . Plantar fasciitis of left foot 11/08/2015  . Non-insulin treated type 2 diabetes mellitus ([place]) 06/08/2015  . Colon cancer screening 06/08/2015  . Bilateral leg edema 12/15/2014  . Blurry vision, bilateral 06/16/2014  . Breast cancer screening 06/16/2014  . Other specified hypothyroidism 06/16/2014  . Other seasonal allergic rhinitis 06/16/2014  . History of stroke 01/31/2014  . Rectal nodule 09/06/2013  . Essential hypertension 08/24/2013  . Nonspecific (abnormal) findings on radiological and other examination of gastrointestinal tract 08/12/2013  . Lateral epicondylitis of right elbow 07/21/2013  . Right shoulder pain 07/21/2013  . Sprain of wrist, right 07/21/2013  . Unspecified constipation 04/15/2013  . Rectal bleeding 04/15/2013    Patient declines infusion at this time. Symptoms tier reviewed as well as criteria for ending isolation. Preventative practices reviewed. Patient verbalized understanding.   She is asymptomatic.   Patient advised to call back if he/she decides that he/she does want to get infusion. Callback number to the infusion center given. Patient advised to go to Urgent care or ED with severe symptoms.   Faythe Casa, NP 02/28/2020 1:19 PM

## 2020-02-29 DIAGNOSIS — U071 COVID-19: Secondary | ICD-10-CM | POA: Diagnosis not present

## 2020-02-29 DIAGNOSIS — R051 Acute cough: Secondary | ICD-10-CM | POA: Diagnosis not present

## 2020-03-15 ENCOUNTER — Encounter: Payer: Self-pay | Admitting: Podiatry

## 2020-03-15 ENCOUNTER — Ambulatory Visit (INDEPENDENT_AMBULATORY_CARE_PROVIDER_SITE_OTHER): Payer: Medicare Other | Admitting: Podiatry

## 2020-03-15 ENCOUNTER — Other Ambulatory Visit: Payer: Self-pay

## 2020-03-15 DIAGNOSIS — M79675 Pain in left toe(s): Secondary | ICD-10-CM

## 2020-03-15 DIAGNOSIS — M79674 Pain in right toe(s): Secondary | ICD-10-CM | POA: Diagnosis not present

## 2020-03-15 DIAGNOSIS — R1013 Epigastric pain: Secondary | ICD-10-CM | POA: Insufficient documentation

## 2020-03-15 DIAGNOSIS — L84 Corns and callosities: Secondary | ICD-10-CM | POA: Diagnosis not present

## 2020-03-15 DIAGNOSIS — M2012 Hallux valgus (acquired), left foot: Secondary | ICD-10-CM

## 2020-03-15 DIAGNOSIS — E119 Type 2 diabetes mellitus without complications: Secondary | ICD-10-CM

## 2020-03-15 DIAGNOSIS — B351 Tinea unguium: Secondary | ICD-10-CM

## 2020-03-15 DIAGNOSIS — R194 Change in bowel habit: Secondary | ICD-10-CM | POA: Insufficient documentation

## 2020-03-15 DIAGNOSIS — M2011 Hallux valgus (acquired), right foot: Secondary | ICD-10-CM

## 2020-03-15 DIAGNOSIS — M792 Neuralgia and neuritis, unspecified: Secondary | ICD-10-CM

## 2020-03-15 DIAGNOSIS — K219 Gastro-esophageal reflux disease without esophagitis: Secondary | ICD-10-CM | POA: Insufficient documentation

## 2020-03-15 DIAGNOSIS — A048 Other specified bacterial intestinal infections: Secondary | ICD-10-CM | POA: Insufficient documentation

## 2020-03-20 NOTE — Progress Notes (Addendum)
Subjective: Tiffany Strickland presents today for preventative diabetic foot care and callus(es) b/l and painful thick toenails that are difficult to trim. Painful toenails interfere with ambulation. Aggravating factors include wearing enclosed shoe gear. Pain is relieved with periodic professional debridement. Painful calluses are aggravated when weightbearing with and without shoegear. Pain is relieved with periodic professional debridement.  Patient relates positive symptoms of right great toe numbness, sometimes all toes. Patient relates occasional symptoms of burning in feet.  She did not check blood glucose this morning.  Audley Hose, MD is patient's PCP. Last visit was December, 2021, per patient recall. Next appointment is March, 2022.  Past Medical History:  Diagnosis Date  . Colon polyp    adenomatous  . Diabetes mellitus without complication ([place])   . Hyperlipidemia   . Hypertension   . Stroke ([place])   . Thyroid disease     Patient Active Problem List   Diagnosis Date Noted  . Change in bowel habit 03/15/2020  . Epigastric pain 03/15/2020  . Gastroesophageal reflux disease 03/15/2020  . Other specified bacterial intestinal infections 03/15/2020  . Pulmonary HTN ([place]) 06/29/2018  . Educated about COVID-19 virus infection 06/29/2018  . OSA (obstructive sleep apnea) 02/03/2017  . Hypothyroidism 02/02/2017  . Chest pain 02/02/2017  . Morbid obesity ([place]) 01/27/2017  . Plantar fasciitis of left foot 11/08/2015  . Non-insulin treated type 2 diabetes mellitus ([place]) 06/08/2015  . Colon cancer screening 06/08/2015  . Bilateral leg edema 12/15/2014  . Blurry vision, bilateral 06/16/2014  . Breast cancer screening 06/16/2014  . Other specified hypothyroidism 06/16/2014  . Other seasonal allergic rhinitis 06/16/2014  . History of stroke 01/31/2014  . Rectal nodule 09/06/2013  . Essential hypertension 08/24/2013  . Nonspecific (abnormal) findings on radiological and other  examination of gastrointestinal tract 08/12/2013  . Lateral epicondylitis of right elbow 07/21/2013  . Right shoulder pain 07/21/2013  . Sprain of wrist, right 07/21/2013  . Unspecified constipation 04/15/2013  . Rectal bleeding 04/15/2013    Past Surgical History:  Procedure Laterality Date  . ABDOMINAL HYSTERECTOMY    . BLADDER SUSPENSION    . EUS N/A 08/12/2013   Procedure: LOWER ENDOSCOPIC ULTRASOUND (EUS);  Surgeon: Milus Banister, MD;  Location: [facility];  Service: Endoscopy;  Laterality: N/A;  . KNEE SURGERY    . TUBAL LIGATION      Current Outpatient Medications on File Prior to Visit  Medication Sig Dispense Refill  . amLODipine (NORVASC) 2.5 MG tablet Take 5 mg by mouth daily.     Marland Kitchen amLODipine (NORVASC) 5 MG tablet Take 5 mg by mouth daily.    . Ascorbic Acid (VITAMIN C) 1000 MG tablet Take 1,000 mg by mouth daily.    Marland Kitchen aspirin (ASPIR-81) 81 MG EC tablet Take 1 tablet (81 mg total) by mouth daily. Swallow whole. 30 tablet 12  . Blood Glucose Monitoring Suppl (RELION PRIME MONITOR) DEVI Use as directed once daily 1 each 0  . Blood Glucose Monitoring Suppl (TRUE METRIX METER) DEVI 1 each by Does not apply route daily before breakfast. 1 Device 0  . cephALEXin (KEFLEX) 500 MG capsule Take 500 mg by mouth 4 (four) times daily.    . cetirizine (ZYRTEC) 10 MG tablet Take 10 mg by mouth daily.    . diclofenac Sodium (VOLTAREN) 1 % GEL Apply 2 g topically 4 (four) times daily.    . famotidine (PEPCID) 40 MG tablet Take 40 mg by mouth 2 (two) times daily.    Marland Kitchen  furosemide (LASIX) 20 MG tablet Take 20 mg by mouth daily.    Marland Kitchen glucose blood (RELION PRIME TEST) test strip Use as instructed once daily 100 each 12  . hydrochlorothiazide (HYDRODIURIL) 25 MG tablet Take 1 tablet (25 mg total) by mouth daily. 30 tablet 6  . ibuprofen (ADVIL) 600 MG tablet Take 1 tablet (600 mg total) by mouth every 6 (six) hours as needed. 30 tablet 0  . isosorbide mononitrate (IMDUR) 60 MG 24 hr tablet  Take 1 tablet (60 mg total) by mouth daily. 30 tablet 6  . levothyroxine (EUTHYROX) 150 MCG tablet Take 1 tablet (150 mcg total) by mouth daily before breakfast. Must have office visit for refills. 30 tablet 0  . losartan (COZAAR) 100 MG tablet Take 1 tablet (100 mg total) by mouth daily. 30 tablet 6  . methocarbamol (ROBAXIN) 500 MG tablet Take 1,000 mg by mouth 2 (two) times daily as needed.    . montelukast (SINGULAIR) 10 MG tablet Take 10 mg by mouth daily.    . Multiple Vitamin (MULTIVITAMIN WITH MINERALS) TABS tablet Take 1 tablet by mouth daily.    . naproxen sodium (ALEVE) 220 MG tablet Take 220 mg by mouth as needed (headache/pain).    . nitroGLYCERIN (NITROSTAT) 0.4 MG SL tablet Place 1 tablet (0.4 mg total) under the tongue every 5 (five) minutes as needed for chest pain. 20 tablet 0  . nystatin (MYCOSTATIN/NYSTOP) powder Apply 1 application topically 3 (three) times daily. 15 g 0  . ReliOn Lancets Micro-Thin 33G MISC Use as instructed once daily 100 each 12  . rosuvastatin (CRESTOR) 20 MG tablet Take 20 mg by mouth at bedtime.     No current facility-administered medications on file prior to visit.     Allergies  Allergen Reactions  . Pollen Extract Other (See Comments)    Seasonal allergies  . Tomato Itching  . Wool Alcohol [Lanolin] Itching    Reaction unknown    Social History   Occupational History  . Not on file  Tobacco Use  . Smoking status: Never Smoker  . Smokeless tobacco: Never Used  Vaping Use  . Vaping Use: Never used  Substance and Sexual Activity  . Alcohol use: No  . Drug use: No  . Sexual activity: Not Currently    Partners: Male    Birth control/protection: Surgical    Family History  Problem Relation Age of Onset  . Hypertension Mother   . Heart disease Maternal Grandmother   . Cancer Maternal Grandmother        ovarian  . Cancer Maternal Grandfather   . Hypertension Other   . Hyperlipidemia Other   . Cancer Other   . Sleep apnea Other    . Obesity Other     Immunization History  Administered Date(s) Administered  . Influenza Split 12/11/2012  . Influenza,inj,Quad PF,6+ Mos 12/11/2012, 01/31/2014, 11/08/2015, 01/27/2017, 11/06/2017  . Pneumococcal Polysaccharide-23 01/27/2017     Objective: There were no vitals filed for this visit.  Glorine Hanratty is a pleasant 65 y.o. African American female in NAD. AAO X 3.  Vascular Examination: Capillary refill time to digits immediate b/l. Palpable pedal pulses b/l LE. Pedal hair present. Lower extremity skin temperature gradient within normal limits. No pain with calf compression b/l. +1 pitting edema b/l lower extremities.  Dermatological Examination: Pedal skin with normal turgor, texture and tone bilaterally. No open wounds bilaterally. No interdigital macerations bilaterally. Toenails 1-5 b/l elongated, discolored, dystrophic, thickened, crumbly with subungual debris  and tenderness to dorsal palpation. Hyperkeratotic lesion(s) L hallux and R hallux.  No erythema, no edema, no drainage, no flocculence.  Musculoskeletal Examination: Normal muscle strength 5/5 to all lower extremity muscle groups bilaterally. No pain crepitus or joint limitation noted with ROM b/l. Hallux valgus with bunion deformity noted b/l lower extremities.  Neurological Examination: Pt has subjective symptoms of neuropathy. Protective sensation intact 5/5 intact bilaterally with 10g monofilament b/l. Vibratory sensation intact b/l. Clonus negative b/l.   Assessment: 1. Pain due to onychomycosis of toenails of both feet   2. Callus   3. Neuropathic pain   4. Hallux valgus, acquired, bilateral   5. Controlled type 2 diabetes mellitus without complication, without long-term current use of insulin ([place])     Plan: -Examined patient. -No new findings. No new orders. -Continue diabetic foot care principles. -Toenails 1-5 b/l were debrided in length and girth with sterile nail nippers and dremel without  iatrogenic bleeding.  -Callus(es) L hallux and R hallux pared utilizing sterile scalpel blade without complication or incident. Total number debrided =2. -Patient to report any pedal injuries to medical professional immediately. -Patient/POA to call should there be question/concern in the interim.  Return in about 3 months (around 06/12/2020).  Marzetta Board, DPM

## 2020-03-23 ENCOUNTER — Other Ambulatory Visit: Payer: Self-pay | Admitting: Gastroenterology

## 2020-03-23 DIAGNOSIS — Z1211 Encounter for screening for malignant neoplasm of colon: Secondary | ICD-10-CM | POA: Diagnosis not present

## 2020-03-23 DIAGNOSIS — K625 Hemorrhage of anus and rectum: Secondary | ICD-10-CM | POA: Diagnosis not present

## 2020-03-23 DIAGNOSIS — K5904 Chronic idiopathic constipation: Secondary | ICD-10-CM | POA: Diagnosis not present

## 2020-03-31 DIAGNOSIS — G4733 Obstructive sleep apnea (adult) (pediatric): Secondary | ICD-10-CM | POA: Diagnosis not present

## 2020-04-25 ENCOUNTER — Ambulatory Visit
Admission: RE | Admit: 2020-04-25 | Discharge: 2020-04-25 | Disposition: A | Discharge status: Home / Self Care | Payer: Medicare Other | Source: Ambulatory Visit | Attending: Internal Medicine | Admitting: Internal Medicine

## 2020-04-25 ENCOUNTER — Other Ambulatory Visit: Payer: Self-pay

## 2020-04-25 ENCOUNTER — Other Ambulatory Visit: Payer: Self-pay | Admitting: Internal Medicine

## 2020-04-25 DIAGNOSIS — I10 Essential (primary) hypertension: Secondary | ICD-10-CM | POA: Diagnosis not present

## 2020-04-25 DIAGNOSIS — J302 Other seasonal allergic rhinitis: Secondary | ICD-10-CM | POA: Diagnosis not present

## 2020-04-25 DIAGNOSIS — M1611 Unilateral primary osteoarthritis, right hip: Secondary | ICD-10-CM | POA: Diagnosis not present

## 2020-04-25 DIAGNOSIS — M25551 Pain in right hip: Secondary | ICD-10-CM | POA: Diagnosis not present

## 2020-04-25 DIAGNOSIS — R309 Painful micturition, unspecified: Secondary | ICD-10-CM | POA: Diagnosis not present

## 2020-04-26 ENCOUNTER — Ambulatory Visit (INDEPENDENT_AMBULATORY_CARE_PROVIDER_SITE_OTHER): Payer: Medicare Other

## 2020-04-26 ENCOUNTER — Ambulatory Visit (INDEPENDENT_AMBULATORY_CARE_PROVIDER_SITE_OTHER): Payer: Medicare Other | Admitting: Podiatry

## 2020-04-26 DIAGNOSIS — S9000XA Contusion of unspecified ankle, initial encounter: Secondary | ICD-10-CM

## 2020-04-26 DIAGNOSIS — M65172 Other infective (teno)synovitis, left ankle and foot: Secondary | ICD-10-CM

## 2020-04-26 DIAGNOSIS — M659 Synovitis and tenosynovitis, unspecified: Secondary | ICD-10-CM

## 2020-04-26 DIAGNOSIS — S9002XS Contusion of left ankle, sequela: Secondary | ICD-10-CM | POA: Diagnosis not present

## 2020-04-26 MED ORDER — MELOXICAM 7.5 MG PO TABS: 7.5000 mg | ORAL_TABLET | Freq: Every day | ORAL | 0 refills | Status: DC

## 2020-04-28 NOTE — Progress Notes (Signed)
Attempted to obtain medical history via telephone, unable to reach at this time. I left a voicemail to return pre surgical testing department's phone call.  

## 2020-05-03 ENCOUNTER — Ambulatory Visit: Payer: Medicare Other

## 2020-05-03 DIAGNOSIS — E119 Type 2 diabetes mellitus without complications: Secondary | ICD-10-CM | POA: Diagnosis not present

## 2020-05-03 DIAGNOSIS — I10 Essential (primary) hypertension: Secondary | ICD-10-CM | POA: Diagnosis not present

## 2020-05-03 DIAGNOSIS — E039 Hypothyroidism, unspecified: Secondary | ICD-10-CM | POA: Diagnosis not present

## 2020-05-03 DIAGNOSIS — I447 Left bundle-branch block, unspecified: Secondary | ICD-10-CM | POA: Diagnosis not present

## 2020-05-03 DIAGNOSIS — I208 Other forms of angina pectoris: Secondary | ICD-10-CM | POA: Diagnosis not present

## 2020-05-03 DIAGNOSIS — I639 Cerebral infarction, unspecified: Secondary | ICD-10-CM | POA: Diagnosis not present

## 2020-05-03 DIAGNOSIS — E785 Hyperlipidemia, unspecified: Secondary | ICD-10-CM | POA: Diagnosis not present

## 2020-05-04 ENCOUNTER — Other Ambulatory Visit (HOSPITAL_COMMUNITY)
Admission: RE | Admit: 2020-05-04 | Discharge: 2020-05-04 | Disposition: A | Discharge status: Home / Self Care | Payer: Medicare Other | Source: Ambulatory Visit | Attending: Gastroenterology | Admitting: Gastroenterology

## 2020-05-04 DIAGNOSIS — E785 Hyperlipidemia, unspecified: Secondary | ICD-10-CM | POA: Diagnosis not present

## 2020-05-04 DIAGNOSIS — E890 Postprocedural hypothyroidism: Secondary | ICD-10-CM | POA: Diagnosis not present

## 2020-05-04 DIAGNOSIS — Z01812 Encounter for preprocedural laboratory examination: Secondary | ICD-10-CM | POA: Diagnosis not present

## 2020-05-04 DIAGNOSIS — Z20822 Contact with and (suspected) exposure to covid-19: Secondary | ICD-10-CM | POA: Insufficient documentation

## 2020-05-04 DIAGNOSIS — I10 Essential (primary) hypertension: Secondary | ICD-10-CM | POA: Diagnosis not present

## 2020-05-04 DIAGNOSIS — E1165 Type 2 diabetes mellitus with hyperglycemia: Secondary | ICD-10-CM | POA: Diagnosis not present

## 2020-05-04 LAB — SARS CORONAVIRUS 2 (TAT 6-24 HRS): SARS Coronavirus 2: NEGATIVE

## 2020-05-04 NOTE — Anesthesia Preprocedure Evaluation (Addendum)
Anesthesia Evaluation  Patient identified by MRN, date of birth, ID band Patient awake    Reviewed: Allergy & Precautions, H&P , NPO status , Patient's Chart, lab work & pertinent test results  Airway Mallampati: II  TM Distance: >3 FB Neck ROM: Full    Dental no notable dental hx. (+) Partial Upper, Dental Advisory Given, Poor Dentition   Pulmonary sleep apnea ,    Pulmonary exam normal breath sounds clear to auscultation       Cardiovascular Exercise Tolerance: Good hypertension, Pt. on medications  Rhythm:Regular Rate:Normal     Neuro/Psych CVA, No Residual Symptoms negative psych ROS   GI/Hepatic Neg liver ROS, GERD  ,  Endo/Other  diabetes, Type 2, Oral Hypoglycemic AgentsHypothyroidism   Renal/GU negative Renal ROS  negative genitourinary   Musculoskeletal  (+) Arthritis ,   Abdominal   Peds  Hematology negative hematology ROS (+)   Anesthesia Other Findings   Reproductive/Obstetrics negative OB ROS                            Anesthesia Physical Anesthesia Plan  ASA: III  Anesthesia Plan: MAC   Post-op Pain Management:    Induction: Intravenous  PONV Risk Score and Plan: 2 and Propofol infusion and Treatment may vary due to age or medical condition  Airway Management Planned: Simple Face Mask  Additional Equipment:   Intra-op Plan:   Post-operative Plan:   Informed Consent: I have reviewed the patients History and Physical, chart, labs and discussed the procedure including the risks, benefits and alternatives for the proposed anesthesia with the patient or authorized representative who has indicated his/her understanding and acceptance.     Dental advisory given  Plan Discussed with: CRNA  Anesthesia Plan Comments:        Anesthesia Quick Evaluation

## 2020-05-05 ENCOUNTER — Ambulatory Visit (HOSPITAL_COMMUNITY): Payer: Medicare Other | Admitting: Anesthesiology

## 2020-05-05 ENCOUNTER — Encounter (HOSPITAL_COMMUNITY)
Admission: RE | Disposition: A | Discharge status: Home / Self Care | Payer: Self-pay | Source: Home / Self Care | Attending: Gastroenterology

## 2020-05-05 ENCOUNTER — Ambulatory Visit (HOSPITAL_COMMUNITY)
Admission: RE | Admit: 2020-05-05 | Discharge: 2020-05-05 | Disposition: A | Discharge status: Home / Self Care | Payer: Medicare Other | Attending: Gastroenterology | Admitting: Gastroenterology

## 2020-05-05 ENCOUNTER — Encounter (HOSPITAL_COMMUNITY): Payer: Self-pay | Admitting: Gastroenterology

## 2020-05-05 ENCOUNTER — Other Ambulatory Visit: Payer: Self-pay

## 2020-05-05 DIAGNOSIS — Z8673 Personal history of transient ischemic attack (TIA), and cerebral infarction without residual deficits: Secondary | ICD-10-CM | POA: Diagnosis not present

## 2020-05-05 DIAGNOSIS — Z836 Family history of other diseases of the respiratory system: Secondary | ICD-10-CM | POA: Diagnosis not present

## 2020-05-05 DIAGNOSIS — K5730 Diverticulosis of large intestine without perforation or abscess without bleeding: Secondary | ICD-10-CM | POA: Insufficient documentation

## 2020-05-05 DIAGNOSIS — Z8041 Family history of malignant neoplasm of ovary: Secondary | ICD-10-CM | POA: Insufficient documentation

## 2020-05-05 DIAGNOSIS — D122 Benign neoplasm of ascending colon: Secondary | ICD-10-CM | POA: Diagnosis not present

## 2020-05-05 DIAGNOSIS — Z9109 Other allergy status, other than to drugs and biological substances: Secondary | ICD-10-CM | POA: Diagnosis not present

## 2020-05-05 DIAGNOSIS — Z8249 Family history of ischemic heart disease and other diseases of the circulatory system: Secondary | ICD-10-CM | POA: Diagnosis not present

## 2020-05-05 DIAGNOSIS — K621 Rectal polyp: Secondary | ICD-10-CM | POA: Insufficient documentation

## 2020-05-05 DIAGNOSIS — Z8349 Family history of other endocrine, nutritional and metabolic diseases: Secondary | ICD-10-CM | POA: Insufficient documentation

## 2020-05-05 DIAGNOSIS — K635 Polyp of colon: Secondary | ICD-10-CM | POA: Diagnosis not present

## 2020-05-05 DIAGNOSIS — K625 Hemorrhage of anus and rectum: Secondary | ICD-10-CM | POA: Diagnosis not present

## 2020-05-05 DIAGNOSIS — E119 Type 2 diabetes mellitus without complications: Secondary | ICD-10-CM | POA: Insufficient documentation

## 2020-05-05 DIAGNOSIS — Z1211 Encounter for screening for malignant neoplasm of colon: Secondary | ICD-10-CM | POA: Diagnosis not present

## 2020-05-05 DIAGNOSIS — Z91018 Allergy to other foods: Secondary | ICD-10-CM | POA: Diagnosis not present

## 2020-05-05 DIAGNOSIS — K921 Melena: Secondary | ICD-10-CM | POA: Diagnosis not present

## 2020-05-05 DIAGNOSIS — D124 Benign neoplasm of descending colon: Secondary | ICD-10-CM | POA: Insufficient documentation

## 2020-05-05 HISTORY — DX: Sleep apnea, unspecified: G47.30

## 2020-05-05 HISTORY — PX: COLONOSCOPY WITH PROPOFOL: SHX5780

## 2020-05-05 HISTORY — PX: POLYPECTOMY: SHX5525

## 2020-05-05 LAB — GLUCOSE, CAPILLARY: Glucose-Capillary: 112 mg/dL — ABNORMAL HIGH (ref 70–99)

## 2020-05-05 SURGERY — COLONOSCOPY WITH PROPOFOL
Anesthesia: Monitor Anesthesia Care

## 2020-05-05 MED ORDER — PROPOFOL 500 MG/50ML IV EMUL
INTRAVENOUS | Status: DC | PRN
  Administered 2020-05-05: 125 ug/kg/min via INTRAVENOUS

## 2020-05-05 MED ORDER — SODIUM CHLORIDE 0.9 % IV SOLN: INTRAVENOUS | Status: DC

## 2020-05-05 MED ORDER — LACTATED RINGERS IV SOLN
INTRAVENOUS | Status: DC
  Administered 2020-05-05: 1000 mL via INTRAVENOUS

## 2020-05-05 MED ORDER — ONDANSETRON HCL 4 MG/2ML IJ SOLN
INTRAMUSCULAR | Status: DC | PRN
  Administered 2020-05-05: 4 mg via INTRAVENOUS

## 2020-05-05 MED ORDER — PROPOFOL 10 MG/ML IV BOLUS
INTRAVENOUS | Status: DC | PRN
  Administered 2020-05-05 (×2): 10 mg via INTRAVENOUS

## 2020-05-05 MED ORDER — PROPOFOL 500 MG/50ML IV EMUL
INTRAVENOUS | Status: AC
  Filled 2020-05-05: qty 50

## 2020-05-05 MED ORDER — PROPOFOL 10 MG/ML IV BOLUS
INTRAVENOUS | Status: AC
  Filled 2020-05-05: qty 20

## 2020-05-05 MED ORDER — EPHEDRINE SULFATE-NACL 50-0.9 MG/10ML-% IV SOSY
PREFILLED_SYRINGE | INTRAVENOUS | Status: DC | PRN
  Administered 2020-05-05: 10 mg via INTRAVENOUS

## 2020-05-05 SURGICAL SUPPLY — 21 items

## 2020-05-05 NOTE — H&P (Signed)
  [name]   HPI: This 65 year old black female presents to the office for further evaluation of rectal bleeding and constipation. She usually has 1 BM per day. Her mother was in Hospice due to a cerebral hemorrhage and seizures and she reports she has not been eating or drinking properly. She has 1 small volume BM per day. She has a lot of gas and bloating. She has good appetite and has gained 17 pounds over the last 2 years. She denies having any complaints of abdominal pain, nausea, vomiting, acid reflux, dysphagia or odynophagia. She denies having a family history of colon cancer, celiac sprue or IBD. Her last colonoscopy was done on 08/06/2013 which revealed moderate diverticulosis in the sigmoid colon.     Past Medical History:  Diagnosis Date  . Colon polyp    adenomatous  . Diabetes mellitus without complication ([place])   . Hyperlipidemia   . Hypertension   . Sleep apnea   . Stroke ([place])   . Thyroid disease     Past Surgical History:  Procedure Laterality Date  . ABDOMINAL HYSTERECTOMY    . BLADDER SUSPENSION    . EUS N/A 08/12/2013   Procedure: LOWER ENDOSCOPIC ULTRASOUND (EUS);  Surgeon: Milus Banister, MD;  Location: [facility];  Service: Endoscopy;  Laterality: N/A;  . KNEE SURGERY    . TUBAL LIGATION      Family History  Problem Relation Age of Onset  . Hypertension Mother   . Heart disease Maternal Grandmother   . Cancer Maternal Grandmother        ovarian  . Cancer Maternal Grandfather   . Hypertension Other   . Hyperlipidemia Other   . Cancer Other   . Sleep apnea Other   . Obesity Other     Social History:  reports that she has never smoked. She has never used smokeless tobacco. She reports that she does not drink alcohol and does not use drugs.  Allergies:  Allergies  Allergen Reactions  . Pollen Extract Other (See Comments)    Seasonal allergies  . Tomato Itching  . Wool Alcohol [Lanolin] Itching    Reaction unknown    Medications:   Scheduled:  Continuous: . sodium chloride      Results for orders placed or performed during the hospital encounter of 05/04/20 (from the past 24 hour(s))  SARS CORONAVIRUS 2 (TAT 6-24 HRS) Nasopharyngeal Nasopharyngeal Swab     Status: None   Collection Time: 05/04/20 12:10 PM   Specimen: Nasopharyngeal Swab  Result Value Ref Range   SARS Coronavirus 2 NEGATIVE NEGATIVE     No results found.  ROS:  As stated above in the HPI otherwise negative.  There were no vitals taken for this visit.    PE: Gen: NAD, Alert and Oriented HEENT:  [place]/AT, EOMI Neck: Supple, no LAD Lungs: CTA Bilaterally CV: RRR without M/G/R ABD: Soft, NTND, +BS Ext: No C/C/E  Assessment/Plan: 1) Rectal bleeding - colonoscopy.  HUNG,PATRICK D 05/05/2020, 7:27 AM

## 2020-05-05 NOTE — Op Note (Signed)
[facility] Patient Name: Tiffany Strickland Procedure Date: 05/05/2020 MRN: [id number] Attending MD: Carol Ada , MD Date of Birth: Aug 02, 1955 CSN: [id number] Age: 65 Admit Type: Outpatient Procedure:                Colonoscopy Indications:              Hematochezia Providers:                Carol Ada, MD, Cleda Daub, RN, Laverda Sorenson,                            Technician, Virgia Land, CRNA Referring MD:              Medicines:                Propofol per Anesthesia Complications:            No immediate complications. Estimated Blood Loss:     Estimated blood loss: none. Procedure:                Pre-Anesthesia Assessment:                           - Prior to the procedure, a History and Physical                            was performed, and patient medications and                            allergies were reviewed. The patient's tolerance of                            previous anesthesia was also reviewed. The risks                            and benefits of the procedure and the sedation                            options and risks were discussed with the patient.                            All questions were answered, and informed consent                            was obtained. Prior Anticoagulants: The patient has                            taken no previous anticoagulant or antiplatelet                            agents. ASA Grade Assessment: III - A patient with                            severe systemic disease. After reviewing the risks                            and  benefits, the patient was deemed in                            satisfactory condition to undergo the procedure.                           - Sedation was administered by an anesthesia                            professional. Deep sedation was attained.                           After obtaining informed consent, the colonoscope                            was passed under direct vision.  Throughout the                            procedure, the patient's blood pressure, pulse, and                            oxygen saturations were monitored continuously. The                            CF-HQ190L ([id number] Olympus colonoscope was                            introduced through the anus and advanced to the the                            cecum, identified by appendiceal orifice and                            ileocecal valve. The colonoscopy was performed                            without difficulty. The patient tolerated the                            procedure well. The quality of the bowel                            preparation was good. The ileocecal valve,                            appendiceal orifice, and rectum were photographed. Scope In: 8:19:51 AM Scope Out: 8:41:48 AM Scope Withdrawal Time: 0 hours 14 minutes 58 seconds  Total Procedure Duration: 0 hours 21 minutes 57 seconds  Findings:      Three sessile polyps were found in the rectum, descending colon and       ascending colon. The polyps were 2 to 3 mm in size. These polyps were       removed with a cold snare. Resection and retrieval were complete.      Scattered small and large-mouthed diverticula were found in the  sigmoid       colon. Impression:               - Three 2 to 3 mm polyps in the rectum, in the                            descending colon and in the ascending colon,                            removed with a cold snare. Resected and retrieved.                           - Diverticulosis in the sigmoid colon. Moderate Sedation:      Not Applicable - Patient had care per Anesthesia. Recommendation:           - Patient has a contact number available for                            emergencies. The signs and symptoms of potential                            delayed complications were discussed with the                            patient. Return to normal activities tomorrow.                             Written discharge instructions were provided to the                            patient.                           - Resume previous diet.                           - Continue present medications.                           - Await pathology results.                           - Repeat colonoscopy in 5 years for surveillance. Procedure Code(s):        --- Professional ---                           [phone], Colonoscopy, flexible; with removal of                            tumor(s), polyp(s), or other lesion(s) by snare                            technique Diagnosis Code(s):        --- Professional ---  K62.1, Rectal polyp                           K63.5, Polyp of colon                           K92.1, Melena (includes Hematochezia)                           K57.30, Diverticulosis of large intestine without                            perforation or abscess without bleeding CPT copyright 2019 American Medical Association. All rights reserved. The codes documented in this report are preliminary and upon coder review may  be revised to meet current compliance requirements. Carol Ada, MD Carol Ada, MD 05/05/2020 8:45:52 AM This report has been signed electronically. Number of Addenda: 0

## 2020-05-05 NOTE — Anesthesia Procedure Notes (Signed)
Procedure Name: MAC Date/Time: 05/05/2020 8:13 AM Performed by: Maxwell Caul, CRNA Pre-anesthesia Checklist: Patient identified, Emergency Drugs available, Suction available and Patient being monitored Oxygen Delivery Method: Simple face mask

## 2020-05-05 NOTE — Transfer of Care (Signed)
Immediate Anesthesia Transfer of Care Note  Patient: Tiffany Strickland  Procedure(s) Performed: COLONOSCOPY WITH PROPOFOL (N/A ) POLYPECTOMY  Patient Location: Endoscopy Unit  Anesthesia Type:MAC  Level of Consciousness: awake, alert  and oriented  Airway & Oxygen Therapy: Patient Spontanous Breathing and Patient connected to face mask oxygen  Post-op Assessment: Report given to RN  Post vital signs: Reviewed and stable  Last Vitals:  Vitals Value Taken Time  BP    Temp    Pulse    Resp    SpO2      Last Pain:  Vitals:   05/05/20 0726  TempSrc: Oral  PainSc: 0-No pain         Complications: No complications documented.

## 2020-05-05 NOTE — Discharge Instructions (Signed)

## 2020-05-05 NOTE — Anesthesia Postprocedure Evaluation (Signed)
Anesthesia Post Note  Patient: Tiffany Strickland  Procedure(s) Performed: COLONOSCOPY WITH PROPOFOL (N/A ) POLYPECTOMY     Patient location during evaluation: Endoscopy Anesthesia Type: MAC Level of consciousness: awake and alert Pain management: pain level controlled Vital Signs Assessment: post-procedure vital signs reviewed and stable Respiratory status: spontaneous breathing, nonlabored ventilation and respiratory function stable Cardiovascular status: stable and blood pressure returned to baseline Postop Assessment: no apparent nausea or vomiting Anesthetic complications: no   No complications documented.  Last Vitals:  Vitals:   05/05/20 0903 05/05/20 0913  BP: 138/74 137/74  Pulse: 60 60  Resp: 13 15  Temp:    SpO2: 100% 99%    Last Pain:  Vitals:   05/05/20 0913  TempSrc:   PainSc: 0-No pain                 Niamh Rada,W. EDMOND

## 2020-05-08 ENCOUNTER — Encounter (HOSPITAL_COMMUNITY): Payer: Self-pay | Admitting: Gastroenterology

## 2020-05-08 ENCOUNTER — Ambulatory Visit: Payer: Medicare Other

## 2020-05-08 LAB — SURGICAL PATHOLOGY

## 2020-05-09 ENCOUNTER — Ambulatory Visit
Admission: RE | Admit: 2020-05-09 | Discharge: 2020-05-09 | Disposition: A | Discharge status: Home / Self Care | Payer: Medicare Other | Source: Ambulatory Visit | Attending: Internal Medicine | Admitting: Internal Medicine

## 2020-05-09 ENCOUNTER — Other Ambulatory Visit: Payer: Self-pay

## 2020-05-09 DIAGNOSIS — Z1231 Encounter for screening mammogram for malignant neoplasm of breast: Secondary | ICD-10-CM | POA: Diagnosis not present

## 2020-05-10 ENCOUNTER — Other Ambulatory Visit: Payer: Self-pay

## 2020-05-10 ENCOUNTER — Encounter: Payer: Self-pay | Admitting: Registered"

## 2020-05-10 ENCOUNTER — Encounter: Payer: Medicare Other | Attending: Internal Medicine | Admitting: Registered"

## 2020-05-10 DIAGNOSIS — E785 Hyperlipidemia, unspecified: Secondary | ICD-10-CM | POA: Diagnosis not present

## 2020-05-10 DIAGNOSIS — I10 Essential (primary) hypertension: Secondary | ICD-10-CM | POA: Diagnosis not present

## 2020-05-10 DIAGNOSIS — E1165 Type 2 diabetes mellitus with hyperglycemia: Secondary | ICD-10-CM | POA: Diagnosis not present

## 2020-05-10 DIAGNOSIS — E119 Type 2 diabetes mellitus without complications: Secondary | ICD-10-CM | POA: Diagnosis not present

## 2020-05-10 DIAGNOSIS — R251 Tremor, unspecified: Secondary | ICD-10-CM | POA: Diagnosis not present

## 2020-05-10 DIAGNOSIS — E890 Postprocedural hypothyroidism: Secondary | ICD-10-CM | POA: Diagnosis not present

## 2020-05-10 DIAGNOSIS — G4733 Obstructive sleep apnea (adult) (pediatric): Secondary | ICD-10-CM | POA: Diagnosis not present

## 2020-05-10 NOTE — Patient Instructions (Signed)
Goals:  Follow Diabetes Meal Plan as instructed  Eat 3 meals and 2 snacks, every 3-5 hrs  Aim to have 1/2 plate of non-starchy vegetables, 1/4 plate of lean protein, and 1/4 plate of carbohydrates.   Aim to have a source carbohydrates + lean protein as snack.  Add lean protein foods to meals/snacks  Monitor glucose levels as instructed by your doctor  Bring food record and glucose log to your next nutrition visit

## 2020-05-10 NOTE — Progress Notes (Signed)
Diabetes Self-Management Education  Visit Type:  First/Initial  Appt. Start Time: 8:24 Appt. End Time: 9:30  05/10/2020  [name], identified by name and date of birth, is a 65 y.o. female with a diagnosis of Diabetes: Type 2.   ASSESSMENT  Per referral - recent A1c was 7.4, an increase from 3 months prior 7.3. Pt states she checks BS once a day: FBS (108-138). States when she feels unstable, she will check BS. States BP is well controlled (123/88, 125/78).   States she is going through a lot with stress and managing family affairs. Reports she has been eating more snack foods at times when grandchildren are visiting.    States she doesn't eat a lot of carbohydrates (bread, potatoes, pasta, sweets) or fried foods. States she eats a lot of salads. Mainly eats baked chicken, fish, or seafood. Does not eat beef often and does not eat pork. States she is lactose intolerant and does not eat much cheese. Reports she will make pizza with cauliflower  Crust, tomato sauce, and plenty of vegetables. Will sometimes make a smoothie (fruit, seeds, protein powder, water/almond milk).   States she just signed up for the local [employer] for constructive exercise. Will also begin doing things at senior center. States she likes to Psychologist, occupational.   Pt expectations: get back on healthy eating track, reading labels   There were no vitals taken for this visit. There is no height or weight on file to calculate BMI.    Diabetes Self-Management Education - 05/10/20 0830      Health Coping   How would you rate your overall health? Good      Psychosocial Assessment   Patient Belief/Attitude about Diabetes Motivated to manage diabetes    Self-care barriers None    Self-management support Doctor's office;Family    Patient Concerns Nutrition/Meal planning    Special Needs None    Preferred Learning Style No preference indicated    Learning Readiness Ready      Complications   Last HgB A1C per  patient/outside source 7.4 %    How often do you check your blood sugar? 1-2 times/day    Fasting Blood glucose range (mg/dL) 70-129    Number of hypoglycemic episodes per month 1    Can you tell when your blood sugar is low? Yes    What do you do if your blood sugar is low? will eat a piece of candy, drink orange juice and water    Number of hyperglycemic episodes per week 0    Have you had a dilated eye exam in the past 12 months? Yes    Have you had a dental exam in the past 12 months? Yes    Are you checking your feet? No      Dietary Intake   Breakfast toast + olive oil mayo + [place] bacon + egg + strawberrry activia yogurt + a few chips + water + green tea    Lunch collard greens    Snack (afternoon) sometimes fruit + PB    Dinner baked chicken + a little bit of rice + a few grilled chicken tenders    Beverage(s) water (88 oz), green tea (8 oz)      Exercise   Exercise Type ADL's    How many days per week to you exercise? 0    How many minutes per day do you exercise? 0    Total minutes per week of exercise 0  Patient Education   Previous Diabetes Education Yes (please comment)   particiapted in diabetes education class years ago)   Disease state  Definition of diabetes, type 1 and 2, and the diagnosis of diabetes;Factors that contribute to the development of diabetes    Nutrition management  Role of diet in the treatment of diabetes and the relationship between the three main macronutrients and blood glucose level;Reviewed blood glucose goals for pre and post meals and how to evaluate the patients' food intake on their blood glucose level.;Food label reading, portion sizes and measuring food.;Effects of alcohol on blood glucose and safety factors with consumption of alcohol.;Information on hints to eating out and maintain blood glucose control.    Monitoring Purpose and frequency of SMBG.;Taught/discussed recording of test results and interpretation of SMBG.;Interpreting lab  values - A1C, lipid, urine microalbumina.;Identified appropriate SMBG and/or A1C goals.    Acute complications Taught treatment of hypoglycemia - the 15 rule.;Discussed and identified patients' treatment of hyperglycemia.    Chronic complications Applicable immunizations;Lipid levels, blood glucose control and heart disease;Reviewed with patient heart disease, higher risk of, and prevention    Psychosocial adjustment Role of stress on diabetes      Individualized Goals (developed by patient)   Nutrition General guidelines for healthy choices and portions discussed    Medications take my medication as prescribed    Monitoring  test my blood glucose as discussed    Reducing Risk examine blood glucose patterns;increase portions of nuts and seeds;treat hypoglycemia with 15 grams of carbs if blood glucose less than 70mg /dL      Post-Education Assessment   Patient understands the diabetes disease and treatment process. Demonstrates understanding / competency    Patient understands incorporating nutritional management into lifestyle. Demonstrates understanding / competency    Patient undertands incorporating physical activity into lifestyle. Needs Review    Patient understands using medications safely. Needs Review    Patient understands monitoring blood glucose, interpreting and using results Demonstrates understanding / competency    Patient understands prevention, detection, and treatment of acute complications. Demonstrates understanding / competency    Patient understands prevention, detection, and treatment of chronic complications. Needs Review    Patient understands how to develop strategies to address psychosocial issues. Demonstrates understanding / competency    Patient understands how to develop strategies to promote health/change behavior. Demonstrates understanding / competency      Outcomes   Program Status Not Completed           Learning Objective:  Patient will have a greater  understanding of diabetes self-management. Patient education plan is to attend individual and/or group sessions per assessed needs and concerns.   Plan:   Patient Instructions  Goals:  Follow Diabetes Meal Plan as instructed  Eat 3 meals and 2 snacks, every 3-5 hrs  Aim to have 1/2 plate of non-starchy vegetables, 1/4 plate of lean protein, and 1/4 plate of carbohydrates.   Aim to have a source carbohydrates + lean protein as snack.  Add lean protein foods to meals/snacks  Monitor glucose levels as instructed by your doctor  Bring food record and glucose log to your next nutrition visit     Expected Outcomes:  Demonstrated interest in learning. Expect positive outcomes  Education material provided: ADA - How to Thrive: A Guide for Your Journey with Diabetes  If problems or questions, patient to contact team via:  Phone and Email  Future DSME appointment: - 4-6 wks

## 2020-05-18 DIAGNOSIS — M65172 Other infective (teno)synovitis, left ankle and foot: Secondary | ICD-10-CM | POA: Diagnosis not present

## 2020-05-18 MED ORDER — BETAMETHASONE SOD PHOS & ACET 6 (3-3) MG/ML IJ SUSP
3.0000 mg | Freq: Once | INTRAMUSCULAR | Status: AC
  Administered 2020-05-18: 3 mg via INTRA_ARTICULAR

## 2020-05-18 NOTE — Progress Notes (Signed)
   Subjective:  65 y.o. female presenting today for evaluation of left ankle pain is been going on approximately 2-5 months now.  Gradual onset.  Patient states that the pain has gotten worse over the last few weeks when applying pressure on the foot and walking.  She has not done anything for treatment.  She denies a history of trauma or injury.   Past Medical History:  Diagnosis Date  . Colon polyp    adenomatous  . Diabetes mellitus without complication ([place])   . Hyperlipidemia   . Hypertension   . Sleep apnea   . Stroke ([place])   . Thyroid disease      Objective / Physical Exam:  General:  The patient is alert and oriented x3 in no acute distress. Dermatology:  Skin is warm, dry and supple bilateral lower extremities. Negative for open lesions or macerations. Vascular:  Palpable pedal pulses bilaterally. No edema or erythema noted. Capillary refill within normal limits. Neurological:  Epicritic and protective threshold grossly intact bilaterally.  Musculoskeletal Exam:  Pain on palpation to the anterior lateral medial aspects of the patient's left ankle. Mild edema noted. Range of motion within normal limits to all pedal and ankle joints bilateral. Muscle strength 5/5 in all groups bilateral.   Radiographic Exam:  Normal osseous mineralization. Joint spaces preserved. No fracture/dislocation/boney destruction.    Assessment: 1.  Synovitis of left ankle  Plan of Care:  1. Patient was evaluated. X-Rays reviewed.  2. Injection of 0.5 mL Celestone Soluspan injected in the patient's left ankle. 3.  Prescription for meloxicam 7.5 mg daily 4.  Return to clinic on next scheduled appointment with Dr. Adah Perl for routine foot care   Edrick Kins, DPM [facility]  Dr. Edrick Kins, [address]                                        [place], [place] [address]                Office [phone]  Fax [phone]

## 2020-05-23 ENCOUNTER — Other Ambulatory Visit: Payer: Self-pay | Admitting: Internal Medicine

## 2020-06-15 ENCOUNTER — Encounter: Payer: Self-pay | Admitting: Registered"

## 2020-06-15 ENCOUNTER — Other Ambulatory Visit: Payer: Self-pay

## 2020-06-15 ENCOUNTER — Encounter: Payer: Medicare Other | Attending: Internal Medicine | Admitting: Registered"

## 2020-06-15 DIAGNOSIS — E119 Type 2 diabetes mellitus without complications: Secondary | ICD-10-CM | POA: Insufficient documentation

## 2020-06-15 NOTE — Progress Notes (Signed)
  Diabetes Self-Management Education  Visit Type:  Follow-up  Appt. Start Time: 8:11 Appt. End Time: 8:48  06/15/2020  [name], identified by name and date of birth, is a 65 y.o. female with a diagnosis of Diabetes:  .   ASSESSMENT  States she needs to come up with a plan. States she knows things she shouldn't eat. Reports she has a mindset to do particular things but she has grandkids with her. States she has started to avoid fast food.  Feels like she has had increased fatigue lately. States she realizes she needs to drink more water. States she has not been motivated. States she bought another case of water yesterday. States she didn't follow through on previous goals because of everything that has been going on around her home. States she needs to let things go and concentrate on herself. States she snacks on apples and PB or tangerines.   States she hasn't checked BS daily but checked today: FBS (140).   Reports she has been going to [employer] playing bingo and participating in arts and crafts a few days a week to get out of the house.   Wakes up around 5-6 am. States her sleep schedule is challenging. States she drinks almond milk and wants to try oat milk.   There were no vitals taken for this visit. There is no height or weight on file to calculate BMI.    Diabetes Self-Management Education - 55/97/41 6384      Complications   Fasting Blood glucose range (mg/dL) 130-179      Dietary Intake   Breakfast skipped    Lunch 1:30 pm - 2 hot dogs (ketchup, saurkraut, mayo, bun)    Dinner 8:30 pm - 2 hot dogs (ketchup, saurkraut, mayo, bun) + 2 mini blueberry muffins      Outcomes   Program Status Not Completed           Learning Objective:  Patient will have a greater understanding of diabetes self-management. Patient education plan is to attend individual and/or group sessions per assessed needs and concerns.   Plan:   Patient Instructions  - Aim to  eat about every 3-5 hours.   Breakfast - no later than 8 am  Lunch - no later than 1 pm  Dinner - no later than 6 pm  Evening snack - no later than 11 pm  - Snacks to be balanced with a source of carbohydrates + lean protein.         Expected Outcomes:  Demonstrated limited interest in learning.  Expect minimal changes  Education material provided: ADA - How to Thrive: A Guide for Your Journey with Diabetes  If problems or questions, patient to contact team via:  Phone and Email  Future DSME appointment: - 4-6 wks

## 2020-06-15 NOTE — Patient Instructions (Addendum)
-   Aim to eat about every 3-5 hours.   Breakfast - no later than 8 am  Lunch - no later than 1 pm  Dinner - no later than 6 pm  Evening snack - no later than 11 pm  - Snacks to be balanced with a source of carbohydrates + lean protein.

## 2020-06-21 ENCOUNTER — Other Ambulatory Visit: Payer: Self-pay

## 2020-06-21 ENCOUNTER — Ambulatory Visit (INDEPENDENT_AMBULATORY_CARE_PROVIDER_SITE_OTHER): Payer: Medicare Other | Admitting: Podiatry

## 2020-06-21 DIAGNOSIS — M79674 Pain in right toe(s): Secondary | ICD-10-CM

## 2020-06-21 DIAGNOSIS — B351 Tinea unguium: Secondary | ICD-10-CM | POA: Diagnosis not present

## 2020-06-21 DIAGNOSIS — E119 Type 2 diabetes mellitus without complications: Secondary | ICD-10-CM | POA: Diagnosis not present

## 2020-06-21 DIAGNOSIS — M2011 Hallux valgus (acquired), right foot: Secondary | ICD-10-CM

## 2020-06-21 DIAGNOSIS — M79675 Pain in left toe(s): Secondary | ICD-10-CM

## 2020-06-21 DIAGNOSIS — M792 Neuralgia and neuritis, unspecified: Secondary | ICD-10-CM

## 2020-06-21 DIAGNOSIS — K5730 Diverticulosis of large intestine without perforation or abscess without bleeding: Secondary | ICD-10-CM | POA: Insufficient documentation

## 2020-06-21 DIAGNOSIS — M2012 Hallux valgus (acquired), left foot: Secondary | ICD-10-CM

## 2020-06-21 DIAGNOSIS — K5904 Chronic idiopathic constipation: Secondary | ICD-10-CM | POA: Insufficient documentation

## 2020-06-21 DIAGNOSIS — L84 Corns and callosities: Secondary | ICD-10-CM

## 2020-06-25 ENCOUNTER — Encounter: Payer: Self-pay | Admitting: Podiatry

## 2020-06-25 NOTE — Progress Notes (Signed)
  Subjective:  [name], female    DOB: [birth year],  MRN: [id number]  Tiffany Strickland presents to clinic today for preventative diabetic foot care and callus(es) b/l feet and painful thick toenails that are difficult to trim. Painful toenails interfere with ambulation. Aggravating factors include wearing enclosed shoe gear. Pain is relieved with periodic professional debridement. Painful calluses are aggravated when weightbearing with and without shoegear. Pain is relieved with periodic professional debridement.   PCP is Dr. Latanya Presser and last visit was 02/29/2020.  Right great toe is tender on today's visit.  Allergies  Allergen Reactions  . Pollen Extract Other (See Comments)    Seasonal allergies  . Tomato Itching  . Wool Alcohol [Lanolin] Itching    Reaction unknown    Review of Systems: Negative except as noted in the HPI. Objective:   Constitutional Tiffany Strickland is a pleasant 65 y.o. African American female, obese in NAD. AAO x 3.   Vascular Capillary refill time to digits immediate b/l. Palpable DP pulse(s) b/l lower extremities Palpable PT pulse(s) b/l lower extremities Pedal hair present. Lower extremity skin temperature gradient within normal limits. No pain with calf compression b/l. +1 pitting edema b/l lower extremities. No cyanosis or clubbing noted.  Neurologic Normal speech. Oriented to person, place, and time. Pt has subjective symptoms of neuropathy. Protective sensation intact 5/5 intact bilaterally with 10g monofilament b/l. Clonus negative b/l.  Dermatologic Pedal skin with normal turgor, texture and tone bilaterally. No open wounds bilaterally. No interdigital macerations bilaterally. Toenails 1-5 b/l elongated, discolored, dystrophic, thickened, crumbly with subungual debris and tenderness to dorsal palpation. Incurvated nailplate medial border(s) R hallux.  Nail border hypertrophy absent. There is tenderness to palpation. Sign(s) of infection:  no clinical signs of infection noted on examination today.. Hyperkeratotic lesion(s) L hallux and R hallux.  No erythema, no edema, no drainage, no fluctuance.  Orthopedic: Normal muscle strength 5/5 to all lower extremity muscle groups bilaterally. No pain crepitus or joint limitation noted with ROM b/l. Hallux valgus with bunion deformity noted b/l lower extremities.   Radiographs: None Assessment:   1. Pain due to onychomycosis of toenails of both feet   2. Callus   3. Neuropathic pain   4. Hallux valgus, acquired, bilateral   5. Controlled type 2 diabetes mellitus without complication, without long-term current use of insulin ([place])    Plan:  Patient was evaluated and treated and all questions answered.  Onychomycosis with pain -Nails palliatively debridement as below -Educated on self-care  Procedure: Nail Debridement Rationale: Pain Type of Debridement: manual, sharp debridement. Instrumentation: Nail nipper, rotary burr. Number of Nails: 10 -Examined patient. -Continue diabetic foot care principles. -Patient to continue soft, supportive shoe gear daily. -Toenails 1-5 b/l were debrided in length and girth with sterile nail nippers and dremel without iatrogenic bleeding.  -Offending nail border debrided and curretaged R hallux utilizing sterile nail nipper and currette. Border cleansed with alcohol and triple antibiotic applied. No further treatment required by patient/caregiver. -Callus(es) L hallux and R hallux pared utilizing sterile scalpel blade without complication or incident. Total number debrided =2. -Patient to report any pedal injuries to medical professional immediately. -Patient/POA to call should there be question/concern in the interim.  Return in about 3 months (around 09/21/2020).  Marzetta Board, DPM

## 2020-06-27 DIAGNOSIS — E890 Postprocedural hypothyroidism: Secondary | ICD-10-CM | POA: Diagnosis not present

## 2020-07-19 ENCOUNTER — Encounter: Payer: Medicare Other | Attending: Internal Medicine | Admitting: Registered"

## 2020-07-19 ENCOUNTER — Other Ambulatory Visit: Payer: Self-pay

## 2020-07-19 ENCOUNTER — Encounter: Payer: Self-pay | Admitting: Registered"

## 2020-07-19 DIAGNOSIS — E119 Type 2 diabetes mellitus without complications: Secondary | ICD-10-CM | POA: Insufficient documentation

## 2020-07-19 NOTE — Patient Instructions (Signed)
-   Aim to have 3 meals a day:  Breakfast no later than 8 am  Lunch no later than 1 pm  Dinner no later than 6 pm  Bedtime snack no later than 11 pm  - Balance snacks with protein + carbohydrates  - Check out [email or web address] for diabetes-friendly recipes

## 2020-07-19 NOTE — Progress Notes (Signed)
Diabetes Self-Management Education  Visit Type:  Follow-up  Appt. Start Time: 8:04 Appt. End Time: 8:45  07/19/2020  [name], identified by name and date of birth, is a 64 y.o. female with a diagnosis of Diabetes:  .   ASSESSMENT  Checks BS once a day: FBS (107-135). States majority of the time her FBS are in the teens (110-119).   States she drunk a lot of water yesterday and that was filling her up and was not feeling hungry. States she woke up around 1-2 am, ate a shrimp cocktail and fell asleep again. States she thinks she fell asleep while eating the shrimp cocktail.   States she has been busy lately. 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   There were no vitals taken for this visit. There is no height or weight on file to calculate BMI.    Diabetes Self-Management Education - 62/95/28 4132      Complications   How often do you check your blood sugar? 1-2 times/day    Fasting Blood glucose range (mg/dL) 70-129    Number of hypoglycemic episodes per month 0    Number of hyperglycemic episodes per week 0    Are you checking your feet? No      Dietary Intake   Breakfast chicken ceasar salad with croutons    Lunch skipped    Dinner 2 slices of [place] + 4 pineapple cupcakes    Snack (evening) shrimp cocktail    Beverage(s) water      Exercise   Exercise Type ADL's    How many days per week to you exercise? 0    How many minutes per day do you exercise? 0    Total minutes per week of exercise 0      Post-Education Assessment   Patient undertands incorporating physical activity into lifestyle. Demonstrates understanding / competency    Patient understands using medications safely. Demonstrates understanding / competency    Patient understands prevention, detection, and treatment of chronic complications. Demonstrates understanding / competency      Outcomes   Program Status  Completed      Subsequent Visit   Since your last visit have you continued or begun to take your medications as prescribed? Yes    Since your last visit have you had your blood pressure checked? Yes    Is your most recent blood pressure lower, unchanged, or higher since your last visit? Unchanged    Since your last visit have you experienced any weight changes? No change    Since your last visit, are you checking your blood glucose at least once a day? Yes           Learning Objective:  Patient will have a greater understanding of diabetes self-management. Patient education plan is to attend individual and/or group sessions per assessed needs and concerns.   Plan:   Patient Instructions  - Aim to have 3 meals a day:  Breakfast no later than 8 am  Lunch no later than 1 pm  Dinner no later than 6 pm  Bedtime snack no later than 11 pm  - Balance snacks with protein + carbohydrates  - Check out [employer] for diabetes-friendly recipes    Expected Outcomes:  Demonstrated interest in learning. Expect positive outcomes  Education material provided: none  If problems or questions, patient to contact team via:  Phone and Email  Future DSME appointment: - PRN

## 2020-08-02 DIAGNOSIS — E785 Hyperlipidemia, unspecified: Secondary | ICD-10-CM | POA: Diagnosis not present

## 2020-08-02 DIAGNOSIS — I208 Other forms of angina pectoris: Secondary | ICD-10-CM | POA: Diagnosis not present

## 2020-08-02 DIAGNOSIS — E039 Hypothyroidism, unspecified: Secondary | ICD-10-CM | POA: Diagnosis not present

## 2020-08-02 DIAGNOSIS — E119 Type 2 diabetes mellitus without complications: Secondary | ICD-10-CM | POA: Diagnosis not present

## 2020-08-02 DIAGNOSIS — I447 Left bundle-branch block, unspecified: Secondary | ICD-10-CM | POA: Diagnosis not present

## 2020-08-02 DIAGNOSIS — I10 Essential (primary) hypertension: Secondary | ICD-10-CM | POA: Diagnosis not present

## 2020-08-08 DIAGNOSIS — J302 Other seasonal allergic rhinitis: Secondary | ICD-10-CM | POA: Diagnosis not present

## 2020-08-08 DIAGNOSIS — R251 Tremor, unspecified: Secondary | ICD-10-CM | POA: Diagnosis not present

## 2020-08-08 DIAGNOSIS — G4733 Obstructive sleep apnea (adult) (pediatric): Secondary | ICD-10-CM | POA: Diagnosis not present

## 2020-08-08 DIAGNOSIS — E1165 Type 2 diabetes mellitus with hyperglycemia: Secondary | ICD-10-CM | POA: Diagnosis not present

## 2020-08-08 DIAGNOSIS — M791 Myalgia, unspecified site: Secondary | ICD-10-CM | POA: Diagnosis not present

## 2020-08-08 DIAGNOSIS — E890 Postprocedural hypothyroidism: Secondary | ICD-10-CM | POA: Diagnosis not present

## 2020-08-08 DIAGNOSIS — I10 Essential (primary) hypertension: Secondary | ICD-10-CM | POA: Diagnosis not present

## 2020-08-08 DIAGNOSIS — E785 Hyperlipidemia, unspecified: Secondary | ICD-10-CM | POA: Diagnosis not present

## 2020-08-08 DIAGNOSIS — I208 Other forms of angina pectoris: Secondary | ICD-10-CM | POA: Diagnosis not present

## 2020-08-08 DIAGNOSIS — R200 Anesthesia of skin: Secondary | ICD-10-CM | POA: Diagnosis not present

## 2020-08-08 DIAGNOSIS — R600 Localized edema: Secondary | ICD-10-CM | POA: Diagnosis not present

## 2020-09-07 DIAGNOSIS — M1712 Unilateral primary osteoarthritis, left knee: Secondary | ICD-10-CM | POA: Diagnosis not present

## 2020-09-13 DIAGNOSIS — M25662 Stiffness of left knee, not elsewhere classified: Secondary | ICD-10-CM | POA: Diagnosis not present

## 2020-09-13 DIAGNOSIS — M25562 Pain in left knee: Secondary | ICD-10-CM | POA: Diagnosis not present

## 2020-09-13 DIAGNOSIS — M6281 Muscle weakness (generalized): Secondary | ICD-10-CM | POA: Diagnosis not present

## 2020-09-13 DIAGNOSIS — R262 Difficulty in walking, not elsewhere classified: Secondary | ICD-10-CM | POA: Diagnosis not present

## 2020-09-19 DIAGNOSIS — M25562 Pain in left knee: Secondary | ICD-10-CM | POA: Diagnosis not present

## 2020-09-19 DIAGNOSIS — M25662 Stiffness of left knee, not elsewhere classified: Secondary | ICD-10-CM | POA: Diagnosis not present

## 2020-09-19 DIAGNOSIS — R262 Difficulty in walking, not elsewhere classified: Secondary | ICD-10-CM | POA: Diagnosis not present

## 2020-09-19 DIAGNOSIS — M6281 Muscle weakness (generalized): Secondary | ICD-10-CM | POA: Diagnosis not present

## 2020-09-21 DIAGNOSIS — R262 Difficulty in walking, not elsewhere classified: Secondary | ICD-10-CM | POA: Diagnosis not present

## 2020-09-21 DIAGNOSIS — M6281 Muscle weakness (generalized): Secondary | ICD-10-CM | POA: Diagnosis not present

## 2020-09-21 DIAGNOSIS — M1712 Unilateral primary osteoarthritis, left knee: Secondary | ICD-10-CM | POA: Diagnosis not present

## 2020-09-21 DIAGNOSIS — M25662 Stiffness of left knee, not elsewhere classified: Secondary | ICD-10-CM | POA: Diagnosis not present

## 2020-09-26 DIAGNOSIS — M6281 Muscle weakness (generalized): Secondary | ICD-10-CM | POA: Diagnosis not present

## 2020-09-26 DIAGNOSIS — M25662 Stiffness of left knee, not elsewhere classified: Secondary | ICD-10-CM | POA: Diagnosis not present

## 2020-09-26 DIAGNOSIS — R262 Difficulty in walking, not elsewhere classified: Secondary | ICD-10-CM | POA: Diagnosis not present

## 2020-09-26 DIAGNOSIS — M25562 Pain in left knee: Secondary | ICD-10-CM | POA: Diagnosis not present

## 2020-09-27 ENCOUNTER — Encounter: Payer: Self-pay | Admitting: Podiatry

## 2020-09-27 ENCOUNTER — Ambulatory Visit (INDEPENDENT_AMBULATORY_CARE_PROVIDER_SITE_OTHER): Payer: Medicare Other | Admitting: Podiatry

## 2020-09-27 ENCOUNTER — Other Ambulatory Visit: Payer: Self-pay

## 2020-09-27 DIAGNOSIS — M79675 Pain in left toe(s): Secondary | ICD-10-CM

## 2020-09-27 DIAGNOSIS — E1142 Type 2 diabetes mellitus with diabetic polyneuropathy: Secondary | ICD-10-CM

## 2020-09-27 DIAGNOSIS — L84 Corns and callosities: Secondary | ICD-10-CM

## 2020-09-27 DIAGNOSIS — M79674 Pain in right toe(s): Secondary | ICD-10-CM | POA: Diagnosis not present

## 2020-09-27 DIAGNOSIS — B351 Tinea unguium: Secondary | ICD-10-CM

## 2020-09-28 DIAGNOSIS — M6281 Muscle weakness (generalized): Secondary | ICD-10-CM | POA: Diagnosis not present

## 2020-09-28 DIAGNOSIS — M25662 Stiffness of left knee, not elsewhere classified: Secondary | ICD-10-CM | POA: Diagnosis not present

## 2020-09-28 DIAGNOSIS — M25562 Pain in left knee: Secondary | ICD-10-CM | POA: Diagnosis not present

## 2020-09-28 DIAGNOSIS — R262 Difficulty in walking, not elsewhere classified: Secondary | ICD-10-CM | POA: Diagnosis not present

## 2020-10-01 NOTE — Progress Notes (Signed)
Subjective: Tiffany Strickland is a pleasant 65 y.o. female patient seen today for preventative diabetic foot care for painful thick toenails that are difficult to trim. Pain interferes with ambulation. Aggravating factors include wearing enclosed shoe gear. Pain is relieved with periodic professional debridement.  Patient states their blood glucose was 125 mg/dl. Patient is requesting diabetic shoes on today's visit.  PCP is Audley Hose, MD. Last visit was: 02/29/2020.  Allergies  Allergen Reactions   Pollen Extract Other (See Comments)    Seasonal allergies   Tomato Itching   Wool Alcohol [Lanolin] Itching    Reaction unknown    Objective: Physical Exam  General: Tiffany Strickland is a pleasant 65 y.o. African American female, in NAD. AAO x 3.   Vascular:  Capillary refill time to digits immediate b/l. Palpable DP pulse(s) b/l lower extremities Palpable PT pulse(s) b/l lower extremities Pedal hair present. Lower extremity skin temperature gradient within normal limits. No pain with calf compression b/l.  Dermatological:  Pedal skin with normal turgor, texture and tone b/l lower extremities. No open wounds b/l lower extremities. No interdigital macerations b/l lower extremities. Toenails 1-5 b/l elongated, discolored, dystrophic, thickened, crumbly with subungual debris and tenderness to dorsal palpation. Hyperkeratotic lesion(s) L hallux, R hallux, and R 5th toe.  No erythema, no edema, no drainage, no fluctuance.  Musculoskeletal:  Normal muscle strength 5/5 to all lower extremity muscle groups bilaterally. Hallux valgus with bunion deformity noted b/l lower extremities. Hammertoe(s) noted to the R 5th toe.  Neurological:  Protective sensation intact 5/5 intact bilaterally with 10g monofilament b/l. Vibratory sensation intact b/l.  Assessment and Plan:  1. Pain due to onychomycosis of toenails of both feet   2. Corns and callosities   3. Diabetic peripheral neuropathy  associated with type 2 diabetes mellitus (Tiffany Strickland)      -Examined patient. -Continue diabetic foot care principles: inspect feet daily, monitor glucose as recommended by PCP and/or Endocrinologist, and follow prescribed diet per PCP, Endocrinologist and/or dietician. -Patient to continue soft, supportive shoe gear daily. Start procedure for diabetic shoes. Patient qualifies based on diagnoses. -Toenails 1-5 b/l were debrided in length and girth with sterile nail nippers and dremel without iatrogenic bleeding.  -Corn(s) R 5th toe and callus(es) L hallux and R hallux were pared utilizing sterile scalpel blade without incident. Total number debrided =3. -Patient to report any pedal injuries to medical professional immediately. -Patient/POA to call should there be question/concern in the interim.  Return in about 3 months (around 12/28/2020).  Tiffany Strickland, DPM

## 2020-10-02 ENCOUNTER — Ambulatory Visit (INDEPENDENT_AMBULATORY_CARE_PROVIDER_SITE_OTHER): Payer: Medicare Other | Admitting: *Deleted

## 2020-10-02 ENCOUNTER — Other Ambulatory Visit: Payer: Self-pay

## 2020-10-02 DIAGNOSIS — M2012 Hallux valgus (acquired), left foot: Secondary | ICD-10-CM

## 2020-10-02 DIAGNOSIS — E1142 Type 2 diabetes mellitus with diabetic polyneuropathy: Secondary | ICD-10-CM

## 2020-10-02 DIAGNOSIS — M2011 Hallux valgus (acquired), right foot: Secondary | ICD-10-CM

## 2020-10-02 NOTE — Progress Notes (Signed)
Patient presents to the office today for diabetic shoe and insole measuring.  Patient was measured with brannock device to determine size and width for 1 pair of extra depth shoes and foam casted for 3 pair of insoles.   Documentation of medical necessity will be sent to patient's treating diabetic doctor to verify and sign.   Patient's diabetic provider: Dr. Latanya Presser  Shoes and insoles will be ordered at that time and patient will be notified for an appointment for fitting when they arrive.   Shoe size (per patient): 9.5   Brannock measurement: RIGHT - 9.5 C, LEFT - 9.5 D  Patient shoe selection-   1st choice:   Apex [id number]  2nd choice:  Apex [id number]  Shoe size ordered: Women's 9.5 X-Wide

## 2020-10-03 DIAGNOSIS — K5900 Constipation, unspecified: Secondary | ICD-10-CM | POA: Diagnosis not present

## 2020-10-03 DIAGNOSIS — K649 Unspecified hemorrhoids: Secondary | ICD-10-CM | POA: Diagnosis not present

## 2020-10-03 DIAGNOSIS — R142 Eructation: Secondary | ICD-10-CM | POA: Diagnosis not present

## 2020-10-03 DIAGNOSIS — I872 Venous insufficiency (chronic) (peripheral): Secondary | ICD-10-CM | POA: Diagnosis not present

## 2020-10-03 DIAGNOSIS — K30 Functional dyspepsia: Secondary | ICD-10-CM | POA: Diagnosis not present

## 2020-10-04 DIAGNOSIS — K30 Functional dyspepsia: Secondary | ICD-10-CM | POA: Diagnosis not present

## 2020-10-10 DIAGNOSIS — M6281 Muscle weakness (generalized): Secondary | ICD-10-CM | POA: Diagnosis not present

## 2020-10-10 DIAGNOSIS — R262 Difficulty in walking, not elsewhere classified: Secondary | ICD-10-CM | POA: Diagnosis not present

## 2020-10-10 DIAGNOSIS — M25562 Pain in left knee: Secondary | ICD-10-CM | POA: Diagnosis not present

## 2020-10-10 DIAGNOSIS — M25662 Stiffness of left knee, not elsewhere classified: Secondary | ICD-10-CM | POA: Diagnosis not present

## 2020-10-12 DIAGNOSIS — M25562 Pain in left knee: Secondary | ICD-10-CM | POA: Diagnosis not present

## 2020-10-12 DIAGNOSIS — M25662 Stiffness of left knee, not elsewhere classified: Secondary | ICD-10-CM | POA: Diagnosis not present

## 2020-10-12 DIAGNOSIS — M6281 Muscle weakness (generalized): Secondary | ICD-10-CM | POA: Diagnosis not present

## 2020-10-12 DIAGNOSIS — R262 Difficulty in walking, not elsewhere classified: Secondary | ICD-10-CM | POA: Diagnosis not present

## 2020-10-17 DIAGNOSIS — K219 Gastro-esophageal reflux disease without esophagitis: Secondary | ICD-10-CM | POA: Diagnosis not present

## 2020-10-17 DIAGNOSIS — K5730 Diverticulosis of large intestine without perforation or abscess without bleeding: Secondary | ICD-10-CM | POA: Diagnosis not present

## 2020-10-17 DIAGNOSIS — K5904 Chronic idiopathic constipation: Secondary | ICD-10-CM | POA: Diagnosis not present

## 2020-10-17 DIAGNOSIS — K625 Hemorrhage of anus and rectum: Secondary | ICD-10-CM | POA: Diagnosis not present

## 2020-10-19 DIAGNOSIS — M25562 Pain in left knee: Secondary | ICD-10-CM | POA: Diagnosis not present

## 2020-10-19 DIAGNOSIS — M6281 Muscle weakness (generalized): Secondary | ICD-10-CM | POA: Diagnosis not present

## 2020-10-19 DIAGNOSIS — M25662 Stiffness of left knee, not elsewhere classified: Secondary | ICD-10-CM | POA: Diagnosis not present

## 2020-10-19 DIAGNOSIS — R262 Difficulty in walking, not elsewhere classified: Secondary | ICD-10-CM | POA: Diagnosis not present

## 2020-10-24 DIAGNOSIS — R262 Difficulty in walking, not elsewhere classified: Secondary | ICD-10-CM | POA: Diagnosis not present

## 2020-10-24 DIAGNOSIS — M6281 Muscle weakness (generalized): Secondary | ICD-10-CM | POA: Diagnosis not present

## 2020-10-24 DIAGNOSIS — M25662 Stiffness of left knee, not elsewhere classified: Secondary | ICD-10-CM | POA: Diagnosis not present

## 2020-10-24 DIAGNOSIS — M25562 Pain in left knee: Secondary | ICD-10-CM | POA: Diagnosis not present

## 2020-10-26 DIAGNOSIS — M25662 Stiffness of left knee, not elsewhere classified: Secondary | ICD-10-CM | POA: Diagnosis not present

## 2020-10-26 DIAGNOSIS — R262 Difficulty in walking, not elsewhere classified: Secondary | ICD-10-CM | POA: Diagnosis not present

## 2020-10-26 DIAGNOSIS — M6281 Muscle weakness (generalized): Secondary | ICD-10-CM | POA: Diagnosis not present

## 2020-10-26 DIAGNOSIS — M1712 Unilateral primary osteoarthritis, left knee: Secondary | ICD-10-CM | POA: Diagnosis not present

## 2020-11-01 DIAGNOSIS — I10 Essential (primary) hypertension: Secondary | ICD-10-CM | POA: Diagnosis not present

## 2020-11-01 DIAGNOSIS — I447 Left bundle-branch block, unspecified: Secondary | ICD-10-CM | POA: Diagnosis not present

## 2020-11-01 DIAGNOSIS — I208 Other forms of angina pectoris: Secondary | ICD-10-CM | POA: Diagnosis not present

## 2020-11-01 DIAGNOSIS — E785 Hyperlipidemia, unspecified: Secondary | ICD-10-CM | POA: Diagnosis not present

## 2020-11-01 DIAGNOSIS — I639 Cerebral infarction, unspecified: Secondary | ICD-10-CM | POA: Diagnosis not present

## 2020-11-02 ENCOUNTER — Emergency Department (HOSPITAL_COMMUNITY): Payer: Medicare Other

## 2020-11-02 ENCOUNTER — Other Ambulatory Visit: Payer: Self-pay

## 2020-11-02 ENCOUNTER — Encounter (HOSPITAL_COMMUNITY): Payer: Self-pay

## 2020-11-02 ENCOUNTER — Emergency Department (HOSPITAL_COMMUNITY)
Admission: EM | Admit: 2020-11-02 | Discharge: 2020-11-02 | Disposition: A | Discharge status: Home / Self Care | Payer: Medicare Other | Attending: Emergency Medicine | Admitting: Emergency Medicine

## 2020-11-02 DIAGNOSIS — R4701 Aphasia: Secondary | ICD-10-CM | POA: Diagnosis not present

## 2020-11-02 DIAGNOSIS — J029 Acute pharyngitis, unspecified: Secondary | ICD-10-CM | POA: Diagnosis not present

## 2020-11-02 DIAGNOSIS — R4781 Slurred speech: Secondary | ICD-10-CM | POA: Insufficient documentation

## 2020-11-02 DIAGNOSIS — Z79899 Other long term (current) drug therapy: Secondary | ICD-10-CM | POA: Insufficient documentation

## 2020-11-02 DIAGNOSIS — Z7982 Long term (current) use of aspirin: Secondary | ICD-10-CM | POA: Diagnosis not present

## 2020-11-02 DIAGNOSIS — I639 Cerebral infarction, unspecified: Secondary | ICD-10-CM | POA: Diagnosis not present

## 2020-11-02 DIAGNOSIS — R0981 Nasal congestion: Secondary | ICD-10-CM | POA: Insufficient documentation

## 2020-11-02 DIAGNOSIS — J352 Hypertrophy of adenoids: Secondary | ICD-10-CM

## 2020-11-02 DIAGNOSIS — Z20822 Contact with and (suspected) exposure to covid-19: Secondary | ICD-10-CM | POA: Diagnosis not present

## 2020-11-02 DIAGNOSIS — Z0389 Encounter for observation for other suspected diseases and conditions ruled out: Secondary | ICD-10-CM | POA: Diagnosis not present

## 2020-11-02 DIAGNOSIS — Y900 Blood alcohol level of less than 20 mg/100 ml: Secondary | ICD-10-CM | POA: Insufficient documentation

## 2020-11-02 DIAGNOSIS — R9431 Abnormal electrocardiogram [ECG] [EKG]: Secondary | ICD-10-CM | POA: Diagnosis not present

## 2020-11-02 DIAGNOSIS — Z7984 Long term (current) use of oral hypoglycemic drugs: Secondary | ICD-10-CM | POA: Diagnosis not present

## 2020-11-02 LAB — DIFFERENTIAL
Abs Immature Granulocytes: 0.04 10*3/uL (ref 0.00–0.07)
Basophils Absolute: 0 10*3/uL (ref 0.0–0.1)
Basophils Relative: 0 %
Eosinophils Absolute: 0 10*3/uL (ref 0.0–0.5)
Eosinophils Relative: 0 %
Immature Granulocytes: 0 %
Lymphocytes Relative: 12 %
Lymphs Abs: 1.2 10*3/uL (ref 0.7–4.0)
Monocytes Absolute: 0.7 10*3/uL (ref 0.1–1.0)
Monocytes Relative: 7 %
Neutro Abs: 8.3 10*3/uL — ABNORMAL HIGH (ref 1.7–7.7)
Neutrophils Relative %: 81 %

## 2020-11-02 LAB — I-STAT CHEM 8, ED
BUN: 12 mg/dL (ref 8–23)
Calcium, Ion: 1.23 mmol/L (ref 1.15–1.40)
Chloride: 103 mmol/L (ref 98–111)
Creatinine, Ser: 0.8 mg/dL (ref 0.44–1.00)
Glucose, Bld: 158 mg/dL — ABNORMAL HIGH (ref 70–99)
HCT: 45 % (ref 36.0–46.0)
Hemoglobin: 15.3 g/dL — ABNORMAL HIGH (ref 12.0–15.0)
Potassium: 3.8 mmol/L (ref 3.5–5.1)
Sodium: 139 mmol/L (ref 135–145)
TCO2: 27 mmol/L (ref 22–32)

## 2020-11-02 LAB — URINALYSIS, ROUTINE W REFLEX MICROSCOPIC
Bacteria, UA: NONE SEEN
Bilirubin Urine: NEGATIVE
Glucose, UA: NEGATIVE mg/dL
Hgb urine dipstick: NEGATIVE
Ketones, ur: NEGATIVE mg/dL
Leukocytes,Ua: NEGATIVE
Nitrite: NEGATIVE
Specific Gravity, Urine: >=1.03 (ref 1.005–1.030)
pH: 6 (ref 5.0–8.0)

## 2020-11-02 LAB — RAPID URINE DRUG SCREEN, HOSP PERFORMED
Amphetamines: NOT DETECTED
Barbiturates: NOT DETECTED
Benzodiazepines: NOT DETECTED
Cocaine: NOT DETECTED
Opiates: NOT DETECTED
Tetrahydrocannabinol: NOT DETECTED

## 2020-11-02 LAB — CBC
HCT: 44.4 % (ref 36.0–46.0)
Hemoglobin: 13.9 g/dL (ref 12.0–15.0)
MCH: 29.7 pg (ref 26.0–34.0)
MCHC: 31.3 g/dL (ref 30.0–36.0)
MCV: 94.9 fL (ref 80.0–100.0)
Platelets: 293 10*3/uL (ref 150–400)
RBC: 4.68 MIL/uL (ref 3.87–5.11)
RDW: 14.8 % (ref 11.5–15.5)
WBC: 10.2 10*3/uL (ref 4.0–10.5)
nRBC: 0 % (ref 0.0–0.2)

## 2020-11-02 LAB — RESP PANEL BY RT-PCR (FLU A&B, COVID) ARPGX2
Influenza A by PCR: NEGATIVE
Influenza B by PCR: NEGATIVE
SARS Coronavirus 2 by RT PCR: NEGATIVE

## 2020-11-02 LAB — COMPREHENSIVE METABOLIC PANEL
ALT: 21 U/L (ref 0–44)
AST: 18 U/L (ref 15–41)
Albumin: 4.2 g/dL (ref 3.5–5.0)
Alkaline Phosphatase: 78 U/L (ref 38–126)
Anion gap: 11 (ref 5–15)
BUN: 13 mg/dL (ref 8–23)
CO2: 27 mmol/L (ref 22–32)
Calcium: 9.7 mg/dL (ref 8.9–10.3)
Chloride: 102 mmol/L (ref 98–111)
Creatinine, Ser: 0.83 mg/dL (ref 0.44–1.00)
GFR, Estimated: >60 mL/min (ref >60)
Glucose, Bld: 161 mg/dL — ABNORMAL HIGH (ref 70–99)
Potassium: 4 mmol/L (ref 3.5–5.1)
Sodium: 140 mmol/L (ref 135–145)
Total Bilirubin: 0.8 mg/dL (ref 0.3–1.2)
Total Protein: 8.4 g/dL — ABNORMAL HIGH (ref 6.5–8.1)

## 2020-11-02 LAB — ETHANOL: Alcohol, Ethyl (B): <10 mg/dL (ref <10)

## 2020-11-02 LAB — APTT: aPTT: 29 seconds (ref 24–36)

## 2020-11-02 LAB — PROTIME-INR
INR: 1 (ref 0.8–1.2)
Prothrombin Time: 12.9 seconds (ref 11.4–15.2)

## 2020-11-02 LAB — GROUP A STREP BY PCR: Group A Strep by PCR: NOT DETECTED

## 2020-11-02 LAB — TSH: TSH: 0.829 u[IU]/mL (ref 0.350–4.500)

## 2020-11-02 MED ORDER — DEXAMETHASONE SODIUM PHOSPHATE 10 MG/ML IJ SOLN
12.0000 mg | Freq: Once | INTRAMUSCULAR | Status: AC
  Administered 2020-11-02: 12 mg via INTRAVENOUS
  Filled 2020-11-02: qty 2

## 2020-11-02 MED ORDER — IOHEXOL 350 MG/ML SOLN
75.0000 mL | Freq: Once | INTRAVENOUS | Status: AC | PRN
  Administered 2020-11-02: 75 mL via INTRAVENOUS

## 2020-11-02 MED ORDER — ACETAMINOPHEN 325 MG PO TABS
650.0000 mg | ORAL_TABLET | Freq: Once | ORAL | Status: AC
  Administered 2020-11-02: 650 mg via ORAL
  Filled 2020-11-02: qty 2

## 2020-11-02 NOTE — ED Triage Notes (Signed)
Pt BIB daughter who reports slurred speech that she noticed this morning. Pt endorses throat pain and sinus congestion. Denies weakness, numbness, loss of sensation, confusion. Equal grips, strength and sensation. Without facial droop. Hx of stroke.

## 2020-11-02 NOTE — Discharge Instructions (Addendum)
Please follow up with ENT Dr. Benjamine Mola for further evaluation of your sore throat. Your CT scan showed some enlargement of the adenoids and tonsils.   I would recommend Ibuprofen and Tylenol as needed for pain control. Drink plenty of fluids to stay hydrated.   Return to the ED for any new/worsening symptoms

## 2020-11-02 NOTE — ED Provider Notes (Signed)
Care assumed from [facility], [place], at shift change, please see their notes for full documentation of patient's complaint/HPI. Briefly, pt here with sore throat that began last night. There was concern about slurred speech and facial asymmetry with family member. Results so far show negative CT head and Mri for stroke. Pt has been cleared by neurology. Awaiting CT soft tissue  neck to assess for any abnormality as there was some difficulty evaluated posterior oropharynx by previous provider. Plan is to dispo accordingly.   Physical Exam  BP 127/65   Pulse 85   Temp 99.5 F (37.5 C) (Oral)   Resp 15   SpO2 96%   Physical Exam Vitals and nursing note reviewed.  Constitutional:      Appearance: She is not ill-appearing.  HENT:     Head: Normocephalic and atraumatic.  Eyes:     Conjunctiva/sclera: Conjunctivae normal.  Cardiovascular:     Rate and Rhythm: Normal rate and regular rhythm.  Pulmonary:     Effort: Pulmonary effort is normal.     Breath sounds: Normal breath sounds.  Skin:    General: Skin is warm and dry.     Coloration: Skin is not jaundiced.  Neurological:     Mental Status: She is alert.    ED Course/Procedures   Clinical Course as of 11/02/20 1851  Thu Nov 02, 2020  1433 Group A Strep by PCR: NOT DETECTED [JS]    Clinical Course User Index [JS] Janeece Fitting, PA-C    Procedures  Results for orders placed or performed during the hospital encounter of 11/02/20  Resp Panel by RT-PCR (Flu A&B, Covid) Nasopharyngeal Swab   Specimen: Nasopharyngeal Swab; Nasopharyngeal(NP) swabs in vial transport medium  Result Value Ref Range   SARS Coronavirus 2 by RT PCR NEGATIVE NEGATIVE   Influenza A by PCR NEGATIVE NEGATIVE   Influenza B by PCR NEGATIVE NEGATIVE  Group A Strep by PCR   Specimen: Throat; Sterile Swab  Result Value Ref Range   Group A Strep by PCR NOT DETECTED NOT DETECTED  Ethanol  Result Value Ref Range   Alcohol, Ethyl (B) <10 <10 mg/dL   Protime-INR  Result Value Ref Range   Prothrombin Time 12.9 11.4 - 15.2 seconds   INR 1.0 0.8 - 1.2  APTT  Result Value Ref Range   aPTT 29 24 - 36 seconds  CBC  Result Value Ref Range   WBC 10.2 4.0 - 10.5 K/uL   RBC 4.68 3.87 - 5.11 MIL/uL   Hemoglobin 13.9 12.0 - 15.0 g/dL   HCT 44.4 36.0 - 46.0 %   MCV 94.9 80.0 - 100.0 fL   MCH 29.7 26.0 - 34.0 pg   MCHC 31.3 30.0 - 36.0 g/dL   RDW 14.8 11.5 - 15.5 %   Platelets 293 150 - 400 K/uL   nRBC 0.0 0.0 - 0.2 %  Differential  Result Value Ref Range   Neutrophils Relative % 81 %   Neutro Abs 8.3 (H) 1.7 - 7.7 K/uL   Lymphocytes Relative 12 %   Lymphs Abs 1.2 0.7 - 4.0 K/uL   Monocytes Relative 7 %   Monocytes Absolute 0.7 0.1 - 1.0 K/uL   Eosinophils Relative 0 %   Eosinophils Absolute 0.0 0.0 - 0.5 K/uL   Basophils Relative 0 %   Basophils Absolute 0.0 0.0 - 0.1 K/uL   Immature Granulocytes 0 %   Abs Immature Granulocytes 0.04 0.00 - 0.07 K/uL  Comprehensive metabolic panel  Result  Value Ref Range   Sodium 140 135 - 145 mmol/L   Potassium 4.0 3.5 - 5.1 mmol/L   Chloride 102 98 - 111 mmol/L   CO2 27 22 - 32 mmol/L   Glucose, Bld 161 (H) 70 - 99 mg/dL   BUN 13 8 - 23 mg/dL   Creatinine, Ser 0.83 0.44 - 1.00 mg/dL   Calcium 9.7 8.9 - 10.3 mg/dL   Total Protein 8.4 (H) 6.5 - 8.1 g/dL   Albumin 4.2 3.5 - 5.0 g/dL   AST 18 15 - 41 U/L   ALT 21 0 - 44 U/L   Alkaline Phosphatase 78 38 - 126 U/L   Total Bilirubin 0.8 0.3 - 1.2 mg/dL   GFR, Estimated >60 >60 mL/min   Anion gap 11 5 - 15  Urine rapid drug screen (hosp performed)  Result Value Ref Range   Opiates NONE DETECTED NONE DETECTED   Cocaine NONE DETECTED NONE DETECTED   Benzodiazepines NONE DETECTED NONE DETECTED   Amphetamines NONE DETECTED NONE DETECTED   Tetrahydrocannabinol NONE DETECTED NONE DETECTED   Barbiturates NONE DETECTED NONE DETECTED  Urinalysis, Routine w reflex microscopic  Result Value Ref Range   Color, Urine YELLOW YELLOW   APPearance HAZY  (A) CLEAR   Specific Gravity, Urine >=1.030 1.005 - 1.030   pH 6.0 5.0 - 8.0   Glucose, UA NEGATIVE NEGATIVE mg/dL   Hgb urine dipstick NEGATIVE NEGATIVE   Bilirubin Urine NEGATIVE NEGATIVE   Ketones, ur NEGATIVE NEGATIVE mg/dL   Protein, ur TRACE (A) NEGATIVE mg/dL   Nitrite NEGATIVE NEGATIVE   Leukocytes,Ua NEGATIVE NEGATIVE   RBC / HPF 0-5 0 - 5 RBC/hpf   WBC, UA 0-5 0 - 5 WBC/hpf   Bacteria, UA NONE SEEN NONE SEEN   Squamous Epithelial / LPF 0-5 0 - 5   Mucus PRESENT    Uric Acid Crys, UA PRESENT   TSH  Result Value Ref Range   TSH 0.829 0.350 - 4.500 uIU/mL  I-stat chem 8, ED  Result Value Ref Range   Sodium 139 135 - 145 mmol/L   Potassium 3.8 3.5 - 5.1 mmol/L   Chloride 103 98 - 111 mmol/L   BUN 12 8 - 23 mg/dL   Creatinine, Ser 0.80 0.44 - 1.00 mg/dL   Glucose, Bld 158 (H) 70 - 99 mg/dL   Calcium, Ion 1.23 1.15 - 1.40 mmol/L   TCO2 27 22 - 32 mmol/L   Hemoglobin 15.3 (H) 12.0 - 15.0 g/dL   HCT 45.0 36.0 - 46.0 %   CT HEAD WO CONTRAST (5MM)  Result Date: 11/02/2020 CLINICAL DATA:  Slurred speech EXAM: CT HEAD WITHOUT CONTRAST TECHNIQUE: Contiguous axial images were obtained from the base of the skull through the vertex without intravenous contrast. COMPARISON:  Brain MRI 09/11/2019 FINDINGS: Brain: There is no evidence of acute intracranial hemorrhage, extra-axial fluid collection, or acute infarct. The ventricles are normal in size. There is no mass lesion. There is no midline shift. Vascular: No hyperdense vessel or unexpected calcification. Skull: Normal. Negative for fracture or focal lesion. Sinuses/Orbits: There is mucosal thickening in the right maxillary sinus, incompletely imaged. The globes and orbits are unremarkable. Other: None. IMPRESSION: Normal head CT. Electronically Signed   By: Valetta Mole M.D.   On: 11/02/2020 11:25   CT Soft Tissue Neck W Contrast  Result Date: 11/02/2020 CLINICAL DATA:  Epiglottitis or tonsillitis suspected sore throat EXAM: CT NECK  WITH CONTRAST TECHNIQUE: Multidetector CT imaging of the neck was  performed using the standard protocol following the bolus administration of intravenous contrast. CONTRAST:  71mL OMNIPAQUE IOHEXOL 350 MG/ML SOLN COMPARISON:  None. FINDINGS: Pharynx and larynx: Mild prominence of the adenoids and palatine tonsils. No evidence of peritonsillar abscess. There are some retained secretions within the nasopharynx and oropharynx. Salivary glands: Parotid and submandibular glands are unremarkable. Thyroid: Small in size. Lymph nodes: No enlarged lymph nodes. Vascular: Major neck vessels are patent. Limited intracranial: No abnormal enhancement. Visualized orbits: Unremarkable. Mastoids and visualized paranasal sinuses: Minor paranasal sinus mucosal thickening. Included mastoid air cells are clear. Skeleton: Mild degenerative changes of the included spine. Upper chest: 2 mm right upper lobe nodule (series 2, image 95). Other: None. IMPRESSION: Mild prominence of adenoids and palatine tonsils is probably reactive. No evidence of abscess. Electronically Signed   By: Macy Mis M.D.   On: 11/02/2020 17:54   MR BRAIN WO CONTRAST  Result Date: 11/02/2020 CLINICAL DATA:  Transient ischemic attack. Additional provided: Slurred speech. EXAM: MRI HEAD WITHOUT CONTRAST TECHNIQUE: Multiplanar, multiecho pulse sequences of the brain and surrounding structures were obtained without intravenous contrast. COMPARISON:  Prior head CT examinations 11/02/2020 and earlier. Brain MRI 09/10/2020. FINDINGS: Brain: Mild intermittent motion degradation. Cerebral volume is normal for age. Mild multifocal T2/FLAIR hyperintensity within the cerebral white matter, nonspecific but compatible with chronic small vessel ischemic disease. There is no acute infarct. No evidence of an intracranial mass. No chronic intracranial blood products. No extra-axial fluid collection. No midline shift. Vascular: Maintained flow voids within the proximal large  arterial vessels. Skull and upper cervical spine: No focal suspicious marrow lesion. Sinuses/Orbits: Visualized orbits show no acute finding. Mild mucosal thickening within the left ethmoid and right maxillary sinuses at the imaged levels. IMPRESSION: Mildly motion degraded examination. No evidence of acute intracranial abnormality. Mild chronic small-vessel ischemic changes within the cerebral white matter, stable as compared to the brain MRI of 09/10/2020. Mild paranasal sinus mucosal thickening. Electronically Signed   By: Kellie Simmering D.O.   On: 11/02/2020 14:02    MDM  CT Soft tissue with mild prominence of the adenoids and palantine tonsils. No abscess identified. Pt reevaluated after decadron - reports slight improvement. She has been tolerating secretions here without difficulty. I do feel she is stable for discharge home. Will have her follow up with ENT. Have recommend Ibuprofen and Tylenol for pain and inflammation. Pt in agreement with plan. Stable for discharge.        Eustaquio Maize, PA-C 11/02/20 1851    Blanchie Dessert, MD 11/03/20 [phone]

## 2020-11-02 NOTE — ED Provider Notes (Signed)
[facility] DEPT Provider Note   CSN: [id number] Arrival date & time: 11/02/20  5638     History Chief Complaint  Patient presents with   Aphasia   Sore Throat    Tiffany Strickland is a 65 y.o. female.  65 y.o female with a PMH of Stroke, DM, HTN, hyperlipidemia presents to the ED with a chief complaint of slurred speech x last night. According to granddaughter, patient's speech was very different this morning.  Patient reported this was likely happening due to some nasal congestion that she has been experiencing in the past week and now has developed a sore throat.  Patient's daughter also reports, she felt that her mother's facial expression was different, she had noted some facial asymmetry with some changes to the right side of her face.  She called PCP who recommended further evaluation in the emergency department due to patients risk factors and prior hx of stroke. Patient denies any focal weakness, no facial droop, no numbness or other complaints.   The history is provided by the patient and medical records.  Sore Throat Pertinent negatives include no chest pain, no abdominal pain and no shortness of breath.      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   Change in bowel habit 03/15/2020   Epigastric pain 03/15/2020   Gastroesophageal reflux disease 03/15/2020   Other specified bacterial intestinal infections 03/15/2020   Pulmonary HTN ([place]) 06/29/2018   Educated about COVID-19 virus infection 06/29/2018   OSA (obstructive sleep apnea) 02/03/2017   Hypothyroidism 02/02/2017   Chest pain 02/02/2017   Morbid obesity ([place]) 01/27/2017    Plantar fasciitis of left foot 11/08/2015   Non-insulin treated type 2 diabetes mellitus ([place]) 06/08/2015   Colon cancer screening 06/08/2015   Bilateral leg edema 12/15/2014   Blurry vision, bilateral 06/16/2014   Breast cancer screening 06/16/2014   Other specified hypothyroidism 06/16/2014   Other seasonal allergic rhinitis 06/16/2014   History of stroke 01/31/2014   Rectal nodule 09/06/2013   Essential hypertension 08/24/2013   Nonspecific (abnormal) findings on radiological and other examination of gastrointestinal tract 08/12/2013   Lateral epicondylitis of right elbow 07/21/2013   Right shoulder pain 07/21/2013   Sprain of wrist, right 07/21/2013   Unspecified constipation 04/15/2013   Rectal bleeding 04/15/2013    Past Surgical History:  Procedure Laterality Date   ABDOMINAL HYSTERECTOMY     BLADDER SUSPENSION     COLONOSCOPY WITH PROPOFOL N/A 05/05/2020   Procedure: COLONOSCOPY WITH PROPOFOL;  Surgeon: Carol Ada, MD;  Location: [facility] ENDOSCOPY;  Service: Endoscopy;  Laterality: N/A;   EUS N/A 08/12/2013   Procedure: LOWER ENDOSCOPIC ULTRASOUND (EUS);  Surgeon: Milus Banister, MD;  Location: [facility];  Service: Endoscopy;  Laterality: N/A;   KNEE SURGERY     POLYPECTOMY  05/05/2020   Procedure: POLYPECTOMY;  Surgeon: Carol Ada, MD;  Location: [facility] ENDOSCOPY;  Service: Endoscopy;;   TUBAL LIGATION       OB History     Gravida  6   Para  5   Term  5   Preterm  AB  1   Living  5      SAB  1   IAB      Ectopic      Multiple      Live Births              Family History  Problem Relation Age of Onset   Hypertension Mother    Heart disease Maternal Grandmother    Cancer Maternal Grandmother        ovarian   Cancer Maternal Grandfather    Hypertension Other    Hyperlipidemia Other    Cancer Other    Sleep apnea Other    Obesity Other     Social History   Tobacco Use   Smoking status: Never   Smokeless tobacco: Never  Vaping  Use   Vaping Use: Never used  Substance Use Topics   Alcohol use: No   Drug use: No    Home Medications Prior to Admission medications   Medication Sig Start Date End Date Taking? Authorizing Provider  amLODipine (NORVASC) 5 MG tablet Take 5 mg by mouth daily. 10/26/19   [provider]  aspirin (ASPIR-81) 81 MG EC tablet Take 1 tablet (81 mg total) by mouth daily. Swallow whole. 11/08/15   [place]ott Rakes, MD  azelastine (ASTELIN) 0.1 % nasal spray Place 1 spray into both nostrils daily. 09/07/20   [provider]  Blood Glucose Monitoring Suppl (RELION PRIME MONITOR) DEVI Use as directed once daily 08/06/18   [place]ott Rakes, MD  Blood Glucose Monitoring Suppl (TRUE METRIX METER) DEVI 1 each by Does not apply route daily before breakfast. 07/14/18   Charlott Rakes, MD  diclofenac Sodium (VOLTAREN) 1 % GEL Apply 4 g topically 4 (four) times daily as needed for pain. 09/08/20   [provider]  furosemide (LASIX) 20 MG tablet Take 20 mg by mouth daily. 10/03/20   [provider]  glipiZIDE (GLUCOTROL XL) 5 MG 24 hr tablet Take 5 mg by mouth daily with breakfast.    [provider]  glucose blood (RELION PRIME TEST) test strip Use as instructed once daily 08/06/18   [place]ott Rakes, MD  hydrochlorothiazide (HYDRODIURIL) 25 MG tablet Take 1 tablet (25 mg total) by mouth daily. 07/07/18   Charlott Rakes, MD  isosorbide mononitrate (IMDUR) 60 MG 24 hr tablet Take 60 mg by mouth daily.    [provider]  levothyroxine (EUTHYROX) 150 MCG tablet Take 1 tablet (150 mcg total) by mouth daily before breakfast. Must have office visit for refills. 11/10/18   Charlott Rakes, MD  Lidocaine 3.5 % PTCH Apply 1 patch topically every 12 (twelve) hours as needed for pain. 09/11/20   [provider]  losartan (COZAAR) 100 MG tablet Take 1 tablet (100 mg total) by mouth daily. 07/07/18   Charlott Rakes, MD  Multiple Vitamin (MULTIVITAMIN WITH MINERALS) TABS  tablet Take 1 tablet by mouth daily.    [provider]  nitroGLYCERIN (NITROSTAT) 0.4 MG SL tablet Place 1 tablet (0.4 mg total) under the tongue every 5 (five) minutes as needed for chest pain. 07/07/18   [place]ott Rakes, MD  potassium chloride (KLOR-CON) 10 MEQ tablet Take 10 mEq by mouth daily. 09/07/20   [provider]  ReliOn Lancets Micro-Thin 33G MISC Use as instructed once daily 08/06/18   Charlott Rakes, MD  rosuvastatin (CRESTOR) 10 MG tablet Take 10 mg by mouth at bedtime. 11/03/19   [provider]  vitamin C (ASCORBIC ACID) 500 MG  tablet Take 500 mg by mouth daily.    [provider]    Allergies    Pollen extract, Tomato, and Wool alcohol [lanolin]  Review of Systems   Review of Systems  Constitutional:  Negative for chills and fever.  HENT:  Positive for sore throat. Negative for voice change.   Respiratory:  Negative for shortness of breath.   Cardiovascular:  Negative for chest pain.  Gastrointestinal:  Negative for abdominal pain, diarrhea, nausea and vomiting.  Genitourinary:  Negative for flank pain.  Musculoskeletal:  Negative for back pain.  Skin:  Negative for pallor and wound.  Neurological:  Positive for facial asymmetry and speech difficulty. Negative for dizziness, syncope, weakness and numbness.  All other systems reviewed and are negative.  Physical Exam Updated Vital Signs BP 127/65   Pulse 85   Temp 98.9 F (37.2 C)   Resp 15   SpO2 96%   Physical Exam Vitals and nursing note reviewed.  Constitutional:      Appearance: She is well-developed.  HENT:     Head: Normocephalic and atraumatic.     Mouth/Throat:     Mouth: Mucous membranes are moist.     Tonsils: No tonsillar exudate or tonsillar abscesses.     Comments: To go to visualize oropharynx fully.  However uvula appears midline, no visible sign of PTA on my exam. Neck:     Comments: Left side cervical adenopathy Cardiovascular:     Rate and Rhythm:  Normal rate.  Pulmonary:     Effort: Pulmonary effort is normal.     Breath sounds: No wheezing.  Abdominal:     Palpations: Abdomen is soft.  Lymphadenopathy:     Cervical: Cervical adenopathy present.  Skin:    General: Skin is warm and dry.  Neurological:     Mental Status: She is alert and oriented to person, place, and time.     Cranial Nerves: Facial asymmetry present.     Comments: Alert, oriented, thought content appropriate. Speech fluent without evidence of aphasia. Able to follow 2 step commands without difficulty.  Cranial Nerves:  II:  Peripheral visual fields grossly normal, pupils, round, reactive to light III,IV, VI: ptosis not present, extra-ocular motions intact bilaterally  V,VII: smile symmetric, with slight facial change noted to the right side of her face VIII: hearing grossly normal bilaterally  IX,X: midline uvula rise  XI: bilateral shoulder shrug equal and strong XII: midline tongue extension  Motor:  5/5 in upper and lower extremities bilaterally including strong and equal grip strength and dorsiflexion/plantar flexion Sensory: light touch normal in all extremities.  Cerebellar: normal finger-to-nose with bilateral upper extremities, pronator drift negative Gait: normal gait and balance      ED Results / Procedures / Treatments   Labs (all labs ordered are listed, but only abnormal results are displayed) Labs Reviewed  DIFFERENTIAL - Abnormal; Notable for the following components:      Result Value   Neutro Abs 8.3 (*)    All other components within normal limits  COMPREHENSIVE METABOLIC PANEL - Abnormal; Notable for the following components:   Glucose, Bld 161 (*)    Total Protein 8.4 (*)    All other components within normal limits  URINALYSIS, ROUTINE W REFLEX MICROSCOPIC - Abnormal; Notable for the following components:   APPearance HAZY (*)    Protein, ur TRACE (*)    All other components within normal limits  I-STAT CHEM 8, ED -  Abnormal; Notable for the following  components:   Glucose, Bld 158 (*)    Hemoglobin 15.3 (*)    All other components within normal limits  RESP PANEL BY RT-PCR (FLU A&B, COVID) ARPGX2  GROUP A STREP BY PCR  ETHANOL  PROTIME-INR  APTT  CBC  RAPID URINE DRUG SCREEN, HOSP PERFORMED  TSH    EKG EKG Interpretation  Date/Time:  Thursday November 02 2020 11:58:43 EDT Ventricular Rate:  101 PR Interval:  197 QRS Duration: 142 QT Interval:  371 QTC Calculation: 481 R Axis:   -50 Text Interpretation: Sinus tachycardia Nonspecific IVCD with LAD Left bundle branch block Confirmed by Addison Lank [phone]) on 11/03/2020 10:04:57 PM  Radiology No results found.  Procedures Procedures   Medications Ordered in ED Medications  acetaminophen (TYLENOL) tablet 650 mg (650 mg Oral Given 11/02/20 1514)  dexamethasone (DECADRON) injection 12 mg (12 mg Intravenous Given 11/02/20 1646)  iohexol (OMNIPAQUE) 350 MG/ML injection 75 mL (75 mLs Intravenous Contrast Given 11/02/20 1721)    ED Course  I have reviewed the triage vital signs and the nursing notes.  Pertinent labs & imaging results that were available during my care of the patient were reviewed by me and considered in my medical decision making (see chart for details).  Clinical Course as of 11/11/20 1456  Thu Nov 02, 2020  1433 Group A Strep by PCR: NOT DETECTED [JS]    Clinical Course User Index [JS] Janeece Fitting, PA-C   MDM Rules/Calculators/A&P    Patient with a medical history of hypertension, stroke, hyperlipidemia, diabetes, thyroid presents to the ED with a chief complaint of slurred speech according to family member.  Last normal last night around 9 PM.  According to patient she is only suffering from some left-sided sore throat that is been ongoing for the past day.  Feels like her sinuses were draining and now have settled into her throat.  She has not taken any medication for improvement in the symptoms.  According to  daughter at the bedside, she was urged to be seen in the ED after contacting PCP due to patient's risk factors along with prior history of CVA.  Patient is currently not anticoagulated.  On arrival vitals are stable, temperature is 99.9, will obtain recheck.  During evaluation no aphasia is noted, no dysarthria.  According to daughter her speech is different than previous although she sounds mostly congested.  There is a slight difference between right and left side of her buccal folds.  No focal weakness on my exam.  Ambulatory in the ED with a steady gait.  According to extensive chart reviews, prior history of CVA on Jun 27 2006.  MRI 2008 then showed: 1 cm area of acute infarct in the left lateral thalamus. Possible occlusion or slow flow in the left posterior cerebral artery.   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  As patient symptoms began last night she is out of our window however will proceed with stroke work-up due to patient's risk factors.  Labs on today's visit with unremarkable CBC, hemoglobin is within normal limits.  CMP without any electrolyte derangement, LFTs are unremarkable.  Creatinine level is normal.  COVID-19, influenza A, influenza B were negative.  Rest of the labs are benign.  CT head on today's visit showed: No acute findings.  MRI brain has been ordered to rule  out any acute finding at this time.  She is resting comfortably with stable vital signs.  MRI Brain showed:  Mildly motion degraded examination.     No evidence of acute intracranial abnormality.     Mild chronic small-vessel ischemic changes within the cerebral white  matter, stable as compared to the brain MRI of 09/10/2020.     Mild paranasal sinus mucosal thickening.      Call placed to neurology for further  recommendations. Patient reports symptoms have improved.   3:04 PM patient reevaluated by me, she reports improvement in her symptoms, however still has difficulty with swallowing.  Oropharynx was attempted to be visualized once again, however uvula does appear midline.  3:25 PM spoke to Dr. Cheral Marker who agreed with disposition home at this time with a negative CT along with MRI.  Patient is febrile with a temp of 100.1 however without any neck rigidity, no signs of meningitis at this time.  Some suspicion for PTA although unable to really visualize the posterior oropharynx.  Will order CT soft tissue neck to rule out any PTA, if negative I feel that patient is reasonable for disposition home.  Patient care signed out to incoming team.   Portions of this note were generated with Dragon dictation software. Dictation errors may occur despite best attempts at proofreading.  Final Clinical Impression(s) / ED Diagnoses Final diagnoses:  Aphasia  Adenoid enlargement  Sore throat    Rx / DC Orders ED Discharge Orders     None        Janeece Fitting, PA-C 11/11/20 1456    Gareth Morgan, MD 11/11/20 1645

## 2020-11-02 NOTE — ED Notes (Signed)
Patient ambulatory to restroom  ?

## 2020-11-06 DIAGNOSIS — J3503 Chronic tonsillitis and adenoiditis: Secondary | ICD-10-CM | POA: Diagnosis not present

## 2020-11-06 DIAGNOSIS — J302 Other seasonal allergic rhinitis: Secondary | ICD-10-CM | POA: Diagnosis not present

## 2020-11-15 DIAGNOSIS — E1165 Type 2 diabetes mellitus with hyperglycemia: Secondary | ICD-10-CM | POA: Diagnosis not present

## 2020-11-15 DIAGNOSIS — G4733 Obstructive sleep apnea (adult) (pediatric): Secondary | ICD-10-CM | POA: Diagnosis not present

## 2020-11-15 DIAGNOSIS — E890 Postprocedural hypothyroidism: Secondary | ICD-10-CM | POA: Diagnosis not present

## 2020-11-15 DIAGNOSIS — Z0001 Encounter for general adult medical examination with abnormal findings: Secondary | ICD-10-CM | POA: Diagnosis not present

## 2020-11-15 DIAGNOSIS — E785 Hyperlipidemia, unspecified: Secondary | ICD-10-CM | POA: Diagnosis not present

## 2020-11-15 DIAGNOSIS — I10 Essential (primary) hypertension: Secondary | ICD-10-CM | POA: Diagnosis not present

## 2020-11-20 ENCOUNTER — Telehealth: Payer: Self-pay | Admitting: Podiatry

## 2020-11-20 NOTE — Telephone Encounter (Signed)
Diabetic shoes/inserts in..lvm for pt to call to schedule an appt to pick them up. 

## 2020-11-23 ENCOUNTER — Other Ambulatory Visit: Payer: Self-pay

## 2020-11-23 ENCOUNTER — Ambulatory Visit (INDEPENDENT_AMBULATORY_CARE_PROVIDER_SITE_OTHER): Payer: Medicare Other | Admitting: *Deleted

## 2020-11-23 DIAGNOSIS — E1142 Type 2 diabetes mellitus with diabetic polyneuropathy: Secondary | ICD-10-CM | POA: Diagnosis not present

## 2020-11-23 DIAGNOSIS — M2012 Hallux valgus (acquired), left foot: Secondary | ICD-10-CM | POA: Diagnosis not present

## 2020-11-23 DIAGNOSIS — M2011 Hallux valgus (acquired), right foot: Secondary | ICD-10-CM

## 2020-11-23 DIAGNOSIS — L84 Corns and callosities: Secondary | ICD-10-CM | POA: Diagnosis not present

## 2020-11-23 NOTE — Progress Notes (Signed)
Patient presents today to pick up diabetic shoes and insoles.  Patient was dispensed 1 pair of diabetic shoes and 3 pairs of foam casted diabetic insoles. Fit was satisfactory. Instructions for break-in and wear was reviewed and a copy was given to the patient.   Re-appointment for regularly scheduled diabetic foot care visits or if they should experience any trouble with the shoes or insoles.  

## 2020-12-04 ENCOUNTER — Ambulatory Visit: Payer: Medicare Other | Admitting: Neurology

## 2020-12-04 DIAGNOSIS — E119 Type 2 diabetes mellitus without complications: Secondary | ICD-10-CM | POA: Diagnosis not present

## 2020-12-04 DIAGNOSIS — H2513 Age-related nuclear cataract, bilateral: Secondary | ICD-10-CM | POA: Diagnosis not present

## 2020-12-06 DIAGNOSIS — J309 Allergic rhinitis, unspecified: Secondary | ICD-10-CM | POA: Diagnosis not present

## 2020-12-06 DIAGNOSIS — G4733 Obstructive sleep apnea (adult) (pediatric): Secondary | ICD-10-CM | POA: Diagnosis not present

## 2020-12-06 DIAGNOSIS — J343 Hypertrophy of nasal turbinates: Secondary | ICD-10-CM | POA: Diagnosis not present

## 2020-12-12 ENCOUNTER — Encounter: Payer: Self-pay | Admitting: Neurology

## 2020-12-12 ENCOUNTER — Ambulatory Visit (INDEPENDENT_AMBULATORY_CARE_PROVIDER_SITE_OTHER): Payer: Medicare Other | Admitting: Neurology

## 2020-12-12 VITALS — BP 118/71 | HR 71 | Ht 65.0 in | Wt 275.4 lb

## 2020-12-12 DIAGNOSIS — R251 Tremor, unspecified: Secondary | ICD-10-CM | POA: Diagnosis not present

## 2020-12-12 NOTE — Progress Notes (Signed)
Subjective:    [name] is a 65 y.o. female.  HPI    Interim history:   Tiffany Strickland is a 65 year old right-handed woman with an underlying medical history of hypertension, sleep apnea, stroke hyperlipidemia, diabetes, thyroid disease, and morbid obesity with a BMI of over 40, who presents for follow-up consultation of her hand tremors.  The patient is unaccompanied today. I last saw her on 11/02/2019, at which time she reported no new symptoms. Her tremor was intermittent and she did not have a significant hand tremor on examination at the time.  We mutually agreed that stress could be a potential trigger, she did not have any evidence of parkinsonism.  She had moved in with her mother to help take care of her.  She was advised to follow-up as needed.  Today, 12/12/2020: She reports feeling fairly well at this time, she just wanted to get checked out for the tremor again, feels like it has been more or less stable, it is intermittent and has not become a constant issue.  She is not keen on trying any new medication.  She tries to hydrate well with water, avoids caffeine typically.  She drinks soda occasionally and drinks herbal tea without caffeine and no coffee.  She does not drink any alcohol.  She has started taking omega-3 fish oil.   She is waiting to get a new CPAP machine.  Her old machine and was infested with ants she reports.  She has been having left knee pain.  She has an appointment with orthopedics tomorrow.  She is using Voltaren gel.  She has her annual visit with her primary care next month.  Of note, she was recently evaluated in the emergency room for throat pain and difficulty with her speech.  She had a sore throat and imaging test with CT soft tissue neck with contrast showed mild prominence of adenoids and palatine tonsils, which was deemed probably reactive, there was no evidence of abscess.  Her TSH was normal on 11/02/2020 at 0.829.  She has had  abnormalities in her TSH before.  She also reported that her family felt her face was asymmetric when she came down with her symptoms.  She had a head CT without contrast on 11/02/2020 and I reviewed the results: Impression: Normal head CT.   She also had a brain MRI without contrast on 11/02/2020 and I reviewed the results:   IMPRESSION: Mildly motion degraded examination.   No evidence of acute intracranial abnormality.   Mild chronic small-vessel ischemic changes within the cerebral white matter, stable as compared to the brain MRI of 09/10/2020.   Mild paranasal sinus mucosal thickening.   She recently saw Dr. Fredric Dine in ENT and I reviewed the note from 12/06/2020.  She reported improved symptoms at the time and exam was noted to be benign.  She was encouraged to start using her CPAP device.  She was advised to use Flonase and Zyrtec for her allergic rhinitis.  Previously:    I first met her on 08/04/2019 at the request of her primary care physician, at which time she reported a hand tremor intermittently for the past few months.  She had no significant tremor on examination at the time and no evidence of parkinsonism.  She was largely reassured.  She was advised to get back to using her CPAP. We talked about tremor triggers. Given the recent onset of tremor I suggested we proceed with a brain MRI with and without contrast  to rule out a structural cause of her symptoms.  She had a brain MRI with and without contrast on 09/11/2019 and I reviewed the results:   IMPRESSION: Unremarkable MRI scan of the brain with and without contrast showing only minor changes of chronic small vessel disease.  Incidental changes of chronic paranasal sinusitis are noted as described above.   She was advised of the test results by phone call.     08/04/19: (She) reports A bilateral hand tremor for the past few months.  The tremor is intermittent.  She has noticed it more on the right side as she is right-handed  but has noticed it on the left as well.  It occurs when she holds something or when she feeds herself.  It is not particularly bothersome but she has noticed it on several occasions.  She admits to having quite a bit of stress lately.  Her mother has been in and out of the hospital, patient reports that she is currently in the ER.  The patient is in the process of moving in with her mother to help her out.  She is an only child and reports no family history of tremor or Parkinson's disease.  She has not been sleeping very well.  She attributes this to stress and having to take care of her 54 year old mother.  The patient has had some blurry vision, she is scheduled to see her eye doctor.  She was told she had cataracts.  She has prescription readers. I reviewed your office records. She had blood tests on 10/14/2018. A1c was 6.5, TSH slightly low at 0.38, free T4 1.5 which is in the normal range. RPR nonreactive, B12 675. She had an office visit with you on 06/18/2019.  She has a history of stroke in 2008 and had seen Dr. Erling Cruz.  She had a brain MRI without contrast on 06/27/2006 and I reviewed the results: IMPRESSION:  1 cm area of acute infarct in the left lateral thalamus. Possible occlusion or slow flow in the left posterior cerebral artery.   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     She reports having no long-term repercussions or sequelae from her stroke.  She is a non-smoker.  She does not drink caffeine on a daily basis and does not drink any alcohol.  She reports that she has not used her CPAP consistently in the past 2 months due to stress and not having a set schedule and having to stay with mom.   She reports having banged her head twice in the recent past.  1 time she hit her head against a fuse box and another time she hit  her head against the door frame.  She did not fall down or lose consciousness. The patient's allergies, current medications, family history, past medical history, past social history, past surgical history and problem list were reviewed and updated as appropriate.     Her Past Medical History Is Significant For: Past Medical History:  Diagnosis Date   Colon polyp    adenomatous   Diabetes mellitus without complication ([place])    Hyperlipidemia    Hypertension    Sleep apnea    Stroke [facility])    Thyroid disease     Her Past Surgical History Is Significant For: Past Surgical History:  Procedure Laterality Date  ABDOMINAL HYSTERECTOMY     BLADDER SUSPENSION     COLONOSCOPY WITH PROPOFOL N/A 05/05/2020   Procedure: COLONOSCOPY WITH PROPOFOL;  Surgeon: Carol Ada, MD;  Location: [facility];  Service: Endoscopy;  Laterality: N/A;   EUS N/A 08/12/2013   Procedure: LOWER ENDOSCOPIC ULTRASOUND (EUS);  Surgeon: Milus Banister, MD;  Location: [facility];  Service: Endoscopy;  Laterality: N/A;   KNEE SURGERY     POLYPECTOMY  05/05/2020   Procedure: POLYPECTOMY;  Surgeon: Carol Ada, MD;  Location: [facility];  Service: Endoscopy;;   TUBAL LIGATION      Her Family History Is Significant For: Family History  Problem Relation Age of Onset   Hypertension Mother    Heart disease Maternal Grandmother    Cancer Maternal Grandmother        ovarian   Cancer Maternal Grandfather    Hypertension Other    Hyperlipidemia Other    Cancer Other    Sleep apnea Other    Obesity Other     Her Social History Is Significant For: Social History   Socioeconomic History   Marital status: Divorced    Spouse name: Not on file   Number of children: Not on file   Years of education: Not on file   Highest education level: Not on file  Occupational History   Not on file  Tobacco Use   Smoking status: Never   Smokeless tobacco: Never  Vaping Use   Vaping Use: Never used  Substance and  Sexual Activity   Alcohol use: No   Drug use: No   Sexual activity: Not Currently    Partners: Male    Birth control/protection: Surgical  Other Topics Concern   Not on file  Social History Narrative   Not on file   Social Determinants of Health   Financial Resource Strain: Not on file  Food Insecurity: No Food Insecurity   Worried About Charity fundraiser in the Last Year: Never true   Ran Out of Food in the Last Year: Never true  Transportation Needs: Not on file  Physical Activity: Not on file  Stress: Not on file  Social Connections: Not on file    Her Allergies Are:  Allergies  Allergen Reactions   Pollen Extract Other (See Comments)    Seasonal allergies   Tomato Itching   Wool Alcohol [Lanolin] Itching    Reaction unknown  :   Her Current Medications Are:  Outpatient Encounter Medications as of 12/12/2020  Medication Sig   amLODipine (NORVASC) 5 MG tablet Take 5 mg by mouth daily.   aspirin (ASPIR-81) 81 MG EC tablet Take 1 tablet (81 mg total) by mouth daily. Swallow whole.   Blood Glucose Monitoring Suppl (RELION PRIME MONITOR) DEVI Use as directed once daily   Blood Glucose Monitoring Suppl (TRUE METRIX METER) DEVI 1 each by Does not apply route daily before breakfast.   diclofenac Sodium (VOLTAREN) 1 % GEL Apply 4 g topically 4 (four) times daily as needed for pain.   fluticasone (FLONASE) 50 MCG/ACT nasal spray Place 1 spray into both nostrils daily.   furosemide (LASIX) 20 MG tablet Take 20 mg by mouth daily as needed.   glipiZIDE (GLUCOTROL XL) 5 MG 24 hr tablet Take 10 mg by mouth daily with breakfast.   glucose blood (RELION PRIME TEST) test strip Use as instructed once daily   hydrochlorothiazide (HYDRODIURIL) 25 MG tablet Take 1 tablet (25 mg total) by mouth daily.   isosorbide mononitrate (  IMDUR) 60 MG 24 hr tablet Take 60 mg by mouth daily.   levothyroxine (EUTHYROX) 150 MCG tablet Take 1 tablet (150 mcg total) by mouth daily before breakfast. Must  have office visit for refills. (Patient taking differently: Take 150 mcg by mouth daily before breakfast. Must have office visit for refills.0.5 tablet MWF 1 tab SS TU TH)   losartan (COZAAR) 100 MG tablet Take 1 tablet (100 mg total) by mouth daily.   Multiple Vitamin (MULTIVITAMIN WITH MINERALS) TABS tablet Take 1 tablet by mouth daily.   nitroGLYCERIN (NITROSTAT) 0.4 MG SL tablet Place 1 tablet (0.4 mg total) under the tongue every 5 (five) minutes as needed for chest pain.   potassium chloride (KLOR-CON) 10 MEQ tablet Take 10 mEq by mouth daily.   rosuvastatin (CRESTOR) 10 MG tablet Take 10 mg by mouth at bedtime.   vitamin C (ASCORBIC ACID) 500 MG tablet Take 500 mg by mouth daily.   Lidocaine 3.5 % PTCH Apply 1 patch topically every 12 (twelve) hours as needed for pain. (Patient not taking: Reported on 12/12/2020)   [DISCONTINUED] azelastine (ASTELIN) 0.1 % nasal spray Place 1 spray into both nostrils daily.   [DISCONTINUED] ReliOn Lancets Micro-Thin 33G MISC Use as instructed once daily   No facility-administered encounter medications on file as of 12/12/2020.  :  Review of Systems:  Out of a complete 14 point review of systems, all are reviewed and negative with the exception of these symptoms as listed below:  Review of Systems  Neurological:        Tremors, slight worsening.     Objective:  Neurological Exam  Physical Exam Physical Examination:   Vitals:   12/12/20 0721  BP: 118/71  Pulse: 71   General Examination: The patient is a very pleasant 66 y.o. female in no acute distress. She appears well-developed and well-nourished and well groomed.   HEENT: Normocephalic, atraumatic, pupils are equal, round and reactive to light, extraocular tracking is well preserved, no nystagmus, no limitation in her gaze.  Hearing is grossly intact, face is symmetric with normal facial animation, speech is clear without dysarthria, voice tremor.  She has no neck tremor.  She has no nuchal  rigidity, no carotid bruits.  Airway examination reveals mild mouth dryness, tongue protrudes centrally in palate elevates symmetrically.  Tonsillar prominence noted, right more than left.  No erythema or discharge noted.   Chest: Clear to auscultation without wheezing, rhonchi or crackles noted.   Heart: S1+S2+0, regular and normal without murmurs, rubs or gallops noted.    Abdomen: Soft, non-tender and non-distended.   Extremities: There is mild swelling in the distal lower extremities bilaterally.    Skin: Warm and dry without trophic changes noted.   Musculoskeletal: exam reveals left knee discomfort.     Neurologically:  Mental status: The patient is awake, alert and oriented in all 4 spheres. Her immediate and remote memory, attention, language skills and fund of knowledge are appropriate. There is no evidence of aphasia, agnosia, apraxia or anomia. Speech is clear with normal prosody and enunciation. Thought process is linear. Mood is normal and affect is normal.  Cranial nerves II - XII are as described above under HEENT exam.  Motor exam: Normal bulk, strength and tone is noted. There is no drift, tremor or rebound. Romberg is negative.    (On 08/04/2019: On Archimedes spiral drawing she has no significant trembling with the right or left hand, handwriting with the right hand which is her dominant hand  is legible, not tremulous, in fact, very neat, not micrographic.)    She has no resting tremor, no significant postural or action tremor, no intention tremor in both upper extremities, no lower extremity tremors.   Fine motor skills and coordination: intact with normal finger taps, normal hand movements, normal rapid alternating patting, normal foot taps and normal foot agility.  Cerebellar testing: No dysmetria or intention tremor on finger to nose testing. Heel to shin is difficult due to decrease in range of motion in her hips but otherwise without dysmetria.  There is no truncal or  gait ataxia.   Sensory exam: intact to light touch.  Gait, station and balance: She stands easily. No veering to one side is noted. No leaning to one side is noted. Posture is age-appropriate and stance is narrow based. Gait shows  mild limp on the left, she has no walking aid.     Assessment and Plan:  In summary, Tiffany Strickland is a very pleasant 65 year old female with an underlying medical history of hypertension, sleep apnea, prior stroke, hyperlipidemia, diabetes, thyroid disease, and morbid obesity with a BMI of over 45, who presents for reevaluation of her intermittent hand tremors.  Exam is benign today and we talked about common tremor triggers and some symptomatic treatment options such as a beta-blocker.  I advised her to continue to monitor her symptoms.  We mutually agreed not to start any symptomatic medication.  Especially in light of her at times low normal blood pressure I would like to avoid a beta-blocker at this time.  She is encouraged to continue to try to rest well.  She is advised to restart her CPAP as soon as possible.  At this juncture, she is advised to follow-up routinely with her primary care physician and other specialists.  I answered all her questions today and she was in agreement. I spent 30 minutes in total face-to-face time and in reviewing records during pre-charting, more than 50% of which was spent in counseling and coordination of care, reviewing test results, reviewing medications and treatment regimen and/or in discussing or reviewing the diagnosis of intermittent tremor, the prognosis and treatment options. Pertinent laboratory and imaging test results that were available during this visit with the patient were reviewed by me and considered in my medical decision making (see chart for details).

## 2020-12-20 DIAGNOSIS — M1712 Unilateral primary osteoarthritis, left knee: Secondary | ICD-10-CM | POA: Diagnosis not present

## 2021-01-01 ENCOUNTER — Ambulatory Visit: Payer: Medicare Other | Admitting: Podiatry

## 2021-01-19 ENCOUNTER — Ambulatory Visit: Payer: Medicare Other | Admitting: Podiatry

## 2021-01-26 ENCOUNTER — Ambulatory Visit (INDEPENDENT_AMBULATORY_CARE_PROVIDER_SITE_OTHER): Payer: Medicare Other | Admitting: Podiatry

## 2021-01-26 ENCOUNTER — Other Ambulatory Visit: Payer: Self-pay

## 2021-01-26 DIAGNOSIS — E1142 Type 2 diabetes mellitus with diabetic polyneuropathy: Secondary | ICD-10-CM | POA: Diagnosis not present

## 2021-01-26 DIAGNOSIS — M2011 Hallux valgus (acquired), right foot: Secondary | ICD-10-CM

## 2021-01-26 DIAGNOSIS — B351 Tinea unguium: Secondary | ICD-10-CM | POA: Diagnosis not present

## 2021-01-26 DIAGNOSIS — M2012 Hallux valgus (acquired), left foot: Secondary | ICD-10-CM | POA: Diagnosis not present

## 2021-01-26 DIAGNOSIS — M79675 Pain in left toe(s): Secondary | ICD-10-CM

## 2021-01-26 DIAGNOSIS — E119 Type 2 diabetes mellitus without complications: Secondary | ICD-10-CM | POA: Diagnosis not present

## 2021-01-26 DIAGNOSIS — M79674 Pain in right toe(s): Secondary | ICD-10-CM

## 2021-01-26 DIAGNOSIS — L84 Corns and callosities: Secondary | ICD-10-CM

## 2021-01-26 NOTE — Progress Notes (Signed)
ANNUAL DIABETIC FOOT EXAM  Subjective: Tiffany Strickland presents today for for annual diabetic foot examination.  Patient relates 2 year h/o diabetes.  Patient denies any h/o foot wounds.  Patient endorses symptoms of numbness, tingling and burning in toes on occasion.  Patient denies any numbness, tingling, burning, or pins/needle sensation in feet  Patient's blood sugar was 110 mg/dl today.   Audley Hose, MD is patient's PCP. Last visit was 10/03/2020.  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/21/2020   Change in bowel habit 03/15/2020   Epigastric pain 03/15/2020   Gastroesophageal reflux disease 03/15/2020   Other specified bacterial intestinal infections 03/15/2020   Pulmonary HTN ([place]) 06/29/2018   Educated about COVID-19 virus infection 06/29/2018   OSA (obstructive sleep apnea) 02/03/2017   Hypothyroidism 02/02/2017   Chest pain 02/02/2017   Morbid obesity ([place]) 01/27/2017   Plantar fasciitis of left foot 11/08/2015   Non-insulin treated type 2 diabetes mellitus ([place]) 06/08/2015   Colon cancer screening 06/08/2015   Bilateral leg edema 12/15/2014   Blurry vision, bilateral 06/16/2014   Breast cancer screening 06/16/2014   Other specified hypothyroidism 06/16/2014   Other seasonal allergic rhinitis 06/16/2014   History of stroke 01/31/2014   Rectal nodule 09/06/2013   Essential hypertension 08/24/2013   Nonspecific (abnormal) findings on radiological and other examination of gastrointestinal tract 08/12/2013   Lateral epicondylitis of right elbow 07/21/2013   Right shoulder pain 07/21/2013   Sprain of wrist, right 07/21/2013   Unspecified constipation 04/15/2013    Rectal bleeding 04/15/2013   Past Surgical History:  Procedure Laterality Date   ABDOMINAL HYSTERECTOMY     BLADDER SUSPENSION     COLONOSCOPY WITH PROPOFOL N/A 05/05/2020   Procedure: COLONOSCOPY WITH PROPOFOL;  Surgeon: Carol Ada, MD;  Location: [facility];  Service: Endoscopy;  Laterality: N/A;   EUS N/A 08/12/2013   Procedure: LOWER ENDOSCOPIC ULTRASOUND (EUS);  Surgeon: Milus Banister, MD;  Location: [facility];  Service: Endoscopy;  Laterality: N/A;   KNEE SURGERY     POLYPECTOMY  05/05/2020   Procedure: POLYPECTOMY;  Surgeon: Carol Ada, MD;  Location: [facility];  Service: Endoscopy;;   TUBAL LIGATION     Current Outpatient Medications on File Prior to Visit  Medication Sig Dispense Refill   Accu-Chek FastClix Lancets MISC SMARTSIG:1 Topical Daily     amLODipine (NORVASC) 5 MG tablet Take 5 mg by mouth daily.     aspirin (ASPIR-81) 81 MG EC tablet Take 1 tablet (81 mg total) by mouth daily. Swallow whole. 30 tablet 12   Blood Glucose Monitoring Suppl (RELION PRIME MONITOR) DEVI Use as directed once daily 1 each 0   Blood Glucose Monitoring Suppl (TRUE METRIX METER) DEVI 1 each by Does not apply route daily before breakfast. 1 Device 0   diclofenac Sodium (VOLTAREN) 1 % GEL Apply 4 g topically 4 (four) times daily as needed for pain.     fluticasone (FLONASE) 50 MCG/ACT nasal spray Place 1 spray into both nostrils daily.     furosemide (LASIX) 20 MG tablet Take 20 mg by mouth daily as needed.     glipiZIDE (GLUCOTROL XL)  5 MG 24 hr tablet Take 10 mg by mouth daily with breakfast.     glucose blood (RELION PRIME TEST) test strip Use as instructed once daily 100 each 12   hydrochlorothiazide (HYDRODIURIL) 25 MG tablet Take 1 tablet (25 mg total) by mouth daily. 30 tablet 6   isosorbide mononitrate (IMDUR) 60 MG 24 hr tablet Take 60 mg by mouth daily.     levothyroxine (EUTHYROX) 150 MCG tablet Take 1 tablet (150 mcg total) by mouth daily before breakfast. Must have  office visit for refills. (Patient taking differently: Take 150 mcg by mouth daily before breakfast. Must have office visit for refills.0.5 tablet MWF 1 tab SS TU TH) 30 tablet 0   Lidocaine 3.5 % PTCH Apply 1 patch topically every 12 (twelve) hours as needed for pain. (Patient not taking: Reported on 12/12/2020)     losartan (COZAAR) 100 MG tablet Take 1 tablet (100 mg total) by mouth daily. 30 tablet 6   Multiple Vitamin (MULTIVITAMIN PO) Take 1 tablet by mouth daily.     Multiple Vitamin (MULTIVITAMIN WITH MINERALS) TABS tablet Take 1 tablet by mouth daily.     nitroGLYCERIN (NITROSTAT) 0.4 MG SL tablet Place 1 tablet (0.4 mg total) under the tongue every 5 (five) minutes as needed for chest pain. 20 tablet 0   penicillin v potassium (VEETID) 500 MG tablet Take 500 mg by mouth every 6 (six) hours.     potassium chloride (KLOR-CON) 10 MEQ tablet Take 10 mEq by mouth daily.     rosuvastatin (CRESTOR) 10 MG tablet Take 10 mg by mouth at bedtime.     vitamin C (ASCORBIC ACID) 500 MG tablet Take 500 mg by mouth daily.     No current facility-administered medications on file prior to visit.    Allergies  Allergen Reactions   Pollen Extract Other (See Comments)    Seasonal allergies   Tomato Itching   Wool Alcohol [Lanolin] Itching    Reaction unknown   Social History   Occupational History   Not on file  Tobacco Use   Smoking status: Never   Smokeless tobacco: Never  Vaping Use   Vaping Use: Never used  Substance and Sexual Activity   Alcohol use: No   Drug use: No   Sexual activity: Not Currently    Partners: Male    Birth control/protection: Surgical   Family History  Problem Relation Age of Onset   Hypertension Mother    Heart disease Maternal Grandmother    Cancer Maternal Grandmother        ovarian   Cancer Maternal Grandfather    Hypertension Other    Hyperlipidemia Other    Cancer Other    Sleep apnea Other    Obesity Other    Immunization History  Administered  Date(s) Administered   Influenza Split 12/11/2012   Influenza,inj,Quad PF,6+ Mos 12/11/2012, 01/31/2014, 11/08/2015, 01/27/2017, 11/06/2017   Pneumococcal Polysaccharide-23 01/27/2017     Review of Systems: Negative except as noted in the HPI.   Objective:  Tiffany Strickland is a pleasant 65 y.o. female in NAD. AAO X 3.  Vascular Examination: CFT immediate b/l LE. Palpable DP/PT pulses b/l LE. Digital hair present b/l. Skin temperature gradient WNL b/l. No pain with calf compression b/l. No edema noted b/l. No cyanosis or clubbing noted b/l LE.  Dermatological Examination: Pedal integument with normal turgor, texture and tone b/l LE. No open wounds b/l. No interdigital macerations b/l. Toenails 1-5 b/l elongated, thickened, discolored with  subungual debris. +Tenderness with dorsal palpation of nailplates. Hyperkeratotic lesion(s) noted bilateral great toes and R 5th toe.  Musculoskeletal Examination: Normal muscle strength 5/5 to all lower extremity muscle groups bilaterally. HAV with bunion deformity noted b/l LE. Hammertoe(s) noted to the R 5th toe.Marland Kitchen No pain, crepitus or joint limitation noted with ROM b/l LE.  Patient ambulates independently without assistive aids.  Footwear Assessment: Does the patient wear appropriate shoes? Yes. Does the patient need inserts/orthotics? No.  Neurological Examination: Protective sensation intact 5/5 intact bilaterally with 10g monofilament b/l. Vibratory sensation intact b/l. Clonus negative b/l.  Assessment: 1. Pain due to onychomycosis of toenails of both feet   2. Corns and callosities   3. Hallux valgus, acquired, bilateral   4. Diabetic peripheral neuropathy associated with type 2 diabetes mellitus ([place])   5. Encounter for diabetic foot exam ([place])    ADA Risk Categorization: Low Risk :  Patient has all of the following: Intact protective sensation No prior foot ulcer  No severe deformity Pedal pulses present  Plan: -Diabetic foot  examination performed today. -Continue foot and shoe inspections daily. Monitor blood glucose per PCP/Endocrinologist's recommendations. -Mycotic toenails 1-5 bilaterally were debrided in length and girth with sterile nail nippers and dremel without incident. -Corn(s) R 5th toe and callus(es) bilateral great toes were pared utilizing sterile scalpel blade without incident. Total number debrided =3. -Patient/POA to call should there be question/concern in the interim.  Return in about 3 months (around 04/26/2021).  Marzetta Board, DPM

## 2021-01-30 DIAGNOSIS — Z0001 Encounter for general adult medical examination with abnormal findings: Secondary | ICD-10-CM | POA: Diagnosis not present

## 2021-01-30 DIAGNOSIS — E890 Postprocedural hypothyroidism: Secondary | ICD-10-CM | POA: Diagnosis not present

## 2021-01-30 DIAGNOSIS — E785 Hyperlipidemia, unspecified: Secondary | ICD-10-CM | POA: Diagnosis not present

## 2021-01-31 ENCOUNTER — Encounter: Payer: Self-pay | Admitting: Podiatry

## 2021-01-31 DIAGNOSIS — I208 Other forms of angina pectoris: Secondary | ICD-10-CM | POA: Diagnosis not present

## 2021-01-31 DIAGNOSIS — I10 Essential (primary) hypertension: Secondary | ICD-10-CM | POA: Diagnosis not present

## 2021-01-31 DIAGNOSIS — E785 Hyperlipidemia, unspecified: Secondary | ICD-10-CM | POA: Diagnosis not present

## 2021-02-07 DIAGNOSIS — Z1211 Encounter for screening for malignant neoplasm of colon: Secondary | ICD-10-CM | POA: Diagnosis not present

## 2021-02-07 DIAGNOSIS — B3731 Acute candidiasis of vulva and vagina: Secondary | ICD-10-CM | POA: Diagnosis not present

## 2021-02-07 DIAGNOSIS — Z23 Encounter for immunization: Secondary | ICD-10-CM | POA: Diagnosis not present

## 2021-02-07 DIAGNOSIS — I10 Essential (primary) hypertension: Secondary | ICD-10-CM | POA: Diagnosis not present

## 2021-02-07 DIAGNOSIS — E890 Postprocedural hypothyroidism: Secondary | ICD-10-CM | POA: Diagnosis not present

## 2021-02-07 DIAGNOSIS — I208 Other forms of angina pectoris: Secondary | ICD-10-CM | POA: Diagnosis not present

## 2021-02-07 DIAGNOSIS — M79674 Pain in right toe(s): Secondary | ICD-10-CM | POA: Diagnosis not present

## 2021-02-07 DIAGNOSIS — Z0001 Encounter for general adult medical examination with abnormal findings: Secondary | ICD-10-CM | POA: Diagnosis not present

## 2021-02-07 DIAGNOSIS — E1165 Type 2 diabetes mellitus with hyperglycemia: Secondary | ICD-10-CM | POA: Diagnosis not present

## 2021-02-07 DIAGNOSIS — Z1239 Encounter for other screening for malignant neoplasm of breast: Secondary | ICD-10-CM | POA: Diagnosis not present

## 2021-02-07 DIAGNOSIS — E876 Hypokalemia: Secondary | ICD-10-CM | POA: Diagnosis not present

## 2021-02-15 DIAGNOSIS — M1712 Unilateral primary osteoarthritis, left knee: Secondary | ICD-10-CM | POA: Diagnosis not present

## 2021-02-20 DIAGNOSIS — R262 Difficulty in walking, not elsewhere classified: Secondary | ICD-10-CM | POA: Diagnosis not present

## 2021-02-20 DIAGNOSIS — M25662 Stiffness of left knee, not elsewhere classified: Secondary | ICD-10-CM | POA: Diagnosis not present

## 2021-02-20 DIAGNOSIS — M1712 Unilateral primary osteoarthritis, left knee: Secondary | ICD-10-CM | POA: Diagnosis not present

## 2021-02-20 DIAGNOSIS — M25562 Pain in left knee: Secondary | ICD-10-CM | POA: Diagnosis not present

## 2021-02-20 DIAGNOSIS — M6281 Muscle weakness (generalized): Secondary | ICD-10-CM | POA: Diagnosis not present

## 2021-02-26 DIAGNOSIS — I447 Left bundle-branch block, unspecified: Secondary | ICD-10-CM | POA: Diagnosis not present

## 2021-02-26 DIAGNOSIS — I639 Cerebral infarction, unspecified: Secondary | ICD-10-CM | POA: Diagnosis not present

## 2021-02-26 DIAGNOSIS — E1169 Type 2 diabetes mellitus with other specified complication: Secondary | ICD-10-CM | POA: Diagnosis not present

## 2021-02-26 DIAGNOSIS — I208 Other forms of angina pectoris: Secondary | ICD-10-CM | POA: Diagnosis not present

## 2021-02-26 DIAGNOSIS — I10 Essential (primary) hypertension: Secondary | ICD-10-CM | POA: Diagnosis not present

## 2021-02-27 DIAGNOSIS — R262 Difficulty in walking, not elsewhere classified: Secondary | ICD-10-CM | POA: Diagnosis not present

## 2021-02-27 DIAGNOSIS — M1712 Unilateral primary osteoarthritis, left knee: Secondary | ICD-10-CM | POA: Diagnosis not present

## 2021-02-27 DIAGNOSIS — M25662 Stiffness of left knee, not elsewhere classified: Secondary | ICD-10-CM | POA: Diagnosis not present

## 2021-02-27 DIAGNOSIS — M6281 Muscle weakness (generalized): Secondary | ICD-10-CM | POA: Diagnosis not present

## 2021-02-27 DIAGNOSIS — M25562 Pain in left knee: Secondary | ICD-10-CM | POA: Diagnosis not present

## 2021-02-28 DIAGNOSIS — K5900 Constipation, unspecified: Secondary | ICD-10-CM | POA: Diagnosis not present

## 2021-02-28 DIAGNOSIS — T148XXA Other injury of unspecified body region, initial encounter: Secondary | ICD-10-CM | POA: Diagnosis not present

## 2021-02-28 DIAGNOSIS — J302 Other seasonal allergic rhinitis: Secondary | ICD-10-CM | POA: Diagnosis not present

## 2021-02-28 DIAGNOSIS — R202 Paresthesia of skin: Secondary | ICD-10-CM | POA: Diagnosis not present

## 2021-03-01 DIAGNOSIS — M25562 Pain in left knee: Secondary | ICD-10-CM | POA: Diagnosis not present

## 2021-03-01 DIAGNOSIS — M1712 Unilateral primary osteoarthritis, left knee: Secondary | ICD-10-CM | POA: Diagnosis not present

## 2021-03-01 DIAGNOSIS — R262 Difficulty in walking, not elsewhere classified: Secondary | ICD-10-CM | POA: Diagnosis not present

## 2021-03-01 DIAGNOSIS — M25662 Stiffness of left knee, not elsewhere classified: Secondary | ICD-10-CM | POA: Diagnosis not present

## 2021-03-01 DIAGNOSIS — M6281 Muscle weakness (generalized): Secondary | ICD-10-CM | POA: Diagnosis not present

## 2021-03-08 DIAGNOSIS — R262 Difficulty in walking, not elsewhere classified: Secondary | ICD-10-CM | POA: Diagnosis not present

## 2021-03-08 DIAGNOSIS — M25662 Stiffness of left knee, not elsewhere classified: Secondary | ICD-10-CM | POA: Diagnosis not present

## 2021-03-08 DIAGNOSIS — M6281 Muscle weakness (generalized): Secondary | ICD-10-CM | POA: Diagnosis not present

## 2021-03-08 DIAGNOSIS — M25562 Pain in left knee: Secondary | ICD-10-CM | POA: Diagnosis not present

## 2021-03-08 DIAGNOSIS — M1712 Unilateral primary osteoarthritis, left knee: Secondary | ICD-10-CM | POA: Diagnosis not present

## 2021-03-15 DIAGNOSIS — M25562 Pain in left knee: Secondary | ICD-10-CM | POA: Diagnosis not present

## 2021-03-15 DIAGNOSIS — R262 Difficulty in walking, not elsewhere classified: Secondary | ICD-10-CM | POA: Diagnosis not present

## 2021-03-15 DIAGNOSIS — M6281 Muscle weakness (generalized): Secondary | ICD-10-CM | POA: Diagnosis not present

## 2021-03-15 DIAGNOSIS — M25662 Stiffness of left knee, not elsewhere classified: Secondary | ICD-10-CM | POA: Diagnosis not present

## 2021-03-15 DIAGNOSIS — M1712 Unilateral primary osteoarthritis, left knee: Secondary | ICD-10-CM | POA: Diagnosis not present

## 2021-03-20 DIAGNOSIS — E785 Hyperlipidemia, unspecified: Secondary | ICD-10-CM | POA: Diagnosis not present

## 2021-03-20 DIAGNOSIS — M6281 Muscle weakness (generalized): Secondary | ICD-10-CM | POA: Diagnosis not present

## 2021-03-20 DIAGNOSIS — I739 Peripheral vascular disease, unspecified: Secondary | ICD-10-CM | POA: Diagnosis not present

## 2021-03-20 DIAGNOSIS — E559 Vitamin D deficiency, unspecified: Secondary | ICD-10-CM | POA: Diagnosis not present

## 2021-03-20 DIAGNOSIS — M1712 Unilateral primary osteoarthritis, left knee: Secondary | ICD-10-CM | POA: Diagnosis not present

## 2021-03-20 DIAGNOSIS — M25662 Stiffness of left knee, not elsewhere classified: Secondary | ICD-10-CM | POA: Diagnosis not present

## 2021-03-20 DIAGNOSIS — R262 Difficulty in walking, not elsewhere classified: Secondary | ICD-10-CM | POA: Diagnosis not present

## 2021-03-20 DIAGNOSIS — M25562 Pain in left knee: Secondary | ICD-10-CM | POA: Diagnosis not present

## 2021-03-20 DIAGNOSIS — E890 Postprocedural hypothyroidism: Secondary | ICD-10-CM | POA: Diagnosis not present

## 2021-03-20 DIAGNOSIS — R202 Paresthesia of skin: Secondary | ICD-10-CM | POA: Diagnosis not present

## 2021-03-20 DIAGNOSIS — E1165 Type 2 diabetes mellitus with hyperglycemia: Secondary | ICD-10-CM | POA: Diagnosis not present

## 2021-03-27 DIAGNOSIS — M1712 Unilateral primary osteoarthritis, left knee: Secondary | ICD-10-CM | POA: Diagnosis not present

## 2021-03-27 DIAGNOSIS — M25562 Pain in left knee: Secondary | ICD-10-CM | POA: Diagnosis not present

## 2021-03-27 DIAGNOSIS — M6281 Muscle weakness (generalized): Secondary | ICD-10-CM | POA: Diagnosis not present

## 2021-03-27 DIAGNOSIS — R262 Difficulty in walking, not elsewhere classified: Secondary | ICD-10-CM | POA: Diagnosis not present

## 2021-03-27 DIAGNOSIS — M25662 Stiffness of left knee, not elsewhere classified: Secondary | ICD-10-CM | POA: Diagnosis not present

## 2021-03-29 DIAGNOSIS — M25662 Stiffness of left knee, not elsewhere classified: Secondary | ICD-10-CM | POA: Diagnosis not present

## 2021-03-29 DIAGNOSIS — M25562 Pain in left knee: Secondary | ICD-10-CM | POA: Diagnosis not present

## 2021-03-29 DIAGNOSIS — M6281 Muscle weakness (generalized): Secondary | ICD-10-CM | POA: Diagnosis not present

## 2021-03-29 DIAGNOSIS — M1712 Unilateral primary osteoarthritis, left knee: Secondary | ICD-10-CM | POA: Diagnosis not present

## 2021-03-29 DIAGNOSIS — R262 Difficulty in walking, not elsewhere classified: Secondary | ICD-10-CM | POA: Diagnosis not present

## 2021-04-19 DIAGNOSIS — K5730 Diverticulosis of large intestine without perforation or abscess without bleeding: Secondary | ICD-10-CM | POA: Diagnosis not present

## 2021-04-19 DIAGNOSIS — K625 Hemorrhage of anus and rectum: Secondary | ICD-10-CM | POA: Diagnosis not present

## 2021-04-19 DIAGNOSIS — K5904 Chronic idiopathic constipation: Secondary | ICD-10-CM | POA: Diagnosis not present

## 2021-05-02 ENCOUNTER — Encounter: Payer: Self-pay | Admitting: Podiatry

## 2021-05-02 ENCOUNTER — Other Ambulatory Visit: Payer: Self-pay

## 2021-05-02 ENCOUNTER — Ambulatory Visit (INDEPENDENT_AMBULATORY_CARE_PROVIDER_SITE_OTHER): Payer: Medicare Other | Admitting: Podiatry

## 2021-05-02 DIAGNOSIS — I208 Other forms of angina pectoris: Secondary | ICD-10-CM | POA: Diagnosis not present

## 2021-05-02 DIAGNOSIS — E039 Hypothyroidism, unspecified: Secondary | ICD-10-CM | POA: Diagnosis not present

## 2021-05-02 DIAGNOSIS — I10 Essential (primary) hypertension: Secondary | ICD-10-CM | POA: Diagnosis not present

## 2021-05-02 DIAGNOSIS — L84 Corns and callosities: Secondary | ICD-10-CM | POA: Diagnosis not present

## 2021-05-02 DIAGNOSIS — E1169 Type 2 diabetes mellitus with other specified complication: Secondary | ICD-10-CM | POA: Diagnosis not present

## 2021-05-02 DIAGNOSIS — M79674 Pain in right toe(s): Secondary | ICD-10-CM | POA: Diagnosis not present

## 2021-05-02 DIAGNOSIS — B351 Tinea unguium: Secondary | ICD-10-CM

## 2021-05-02 DIAGNOSIS — E119 Type 2 diabetes mellitus without complications: Secondary | ICD-10-CM | POA: Diagnosis not present

## 2021-05-02 DIAGNOSIS — M79675 Pain in left toe(s): Secondary | ICD-10-CM | POA: Diagnosis not present

## 2021-05-10 DIAGNOSIS — G4733 Obstructive sleep apnea (adult) (pediatric): Secondary | ICD-10-CM | POA: Diagnosis not present

## 2021-05-10 DIAGNOSIS — E1165 Type 2 diabetes mellitus with hyperglycemia: Secondary | ICD-10-CM | POA: Diagnosis not present

## 2021-05-10 DIAGNOSIS — H1013 Acute atopic conjunctivitis, bilateral: Secondary | ICD-10-CM | POA: Diagnosis not present

## 2021-05-10 DIAGNOSIS — E890 Postprocedural hypothyroidism: Secondary | ICD-10-CM | POA: Diagnosis not present

## 2021-05-10 DIAGNOSIS — I10 Essential (primary) hypertension: Secondary | ICD-10-CM | POA: Diagnosis not present

## 2021-05-10 DIAGNOSIS — I872 Venous insufficiency (chronic) (peripheral): Secondary | ICD-10-CM | POA: Diagnosis not present

## 2021-05-10 DIAGNOSIS — E785 Hyperlipidemia, unspecified: Secondary | ICD-10-CM | POA: Diagnosis not present

## 2021-05-10 DIAGNOSIS — E1142 Type 2 diabetes mellitus with diabetic polyneuropathy: Secondary | ICD-10-CM | POA: Diagnosis not present

## 2021-05-10 NOTE — Progress Notes (Signed)
?  Subjective:  ?[name], female    DOB: Jan 04, 1956,  MRN: [id number] ? ?Tiffany Strickland presents to clinic today for preventative diabetic foot care and corn(s) right foot, callus(es) b/l lower extremitiesand painful mycotic nails.  Pain interferes with ambulation. Aggravating factors include wearing enclosed shoe gear. Painful toenails interfere with ambulation. Aggravating factors include wearing enclosed shoe gear. Pain is relieved with periodic professional debridement. Painful corns and calluses are aggravated when weightbearing with and without shoegear. Pain is relieved with periodic professional debridement. ? ?Patient cannot remember her blood glucose reading nor last A1c today. ? ?New problem(s): None.  ? ?PCP is Audley Hose, MD , and last visit was November 15, 2020. ? ?Allergies  ?Allergen Reactions  ? Pollen Extract Other (See Comments)  ?  Seasonal allergies  ? Tomato Itching  ? Wool Alcohol [Lanolin] Itching  ?  Reaction unknown  ? ? ?Review of Systems: Negative except as noted in the HPI. ? ?Objective: ? ?Tiffany Strickland is a pleasant 66 y.o. female in NAD. AAO X 3. ? ?Vascular Examination: ?CFT immediate b/l LE. Palpable DP/PT pulses b/l LE. Digital hair present b/l. Skin temperature gradient WNL b/l. No pain with calf compression b/l. No edema noted b/l. No cyanosis or clubbing noted b/l LE. ? ?Dermatological Examination: ?Pedal integument with normal turgor, texture and tone b/l LE. No open wounds b/l. No interdigital macerations b/l. Toenails 1-5 b/l elongated, thickened, discolored with subungual debris. +Tenderness with dorsal palpation of nailplates. Hyperkeratotic lesion(s) noted R 5th toe. Calluses b/l great toes resolved. ? ?Musculoskeletal Examination: ?Normal muscle strength 5/5 to all lower extremity muscle groups bilaterally. HAV with bunion deformity noted b/l LE. Hammertoe(s) noted to the R 5th toe.Marland Kitchen No pain, crepitus or joint limitation noted with ROM b/l LE.   Patient ambulates independently without assistive aids. ? ?Neurological Examination: ?Protective sensation intact 5/5 intact bilaterally with 10g monofilament b/l. Vibratory sensation intact b/l. Clonus negative b/l. ? ?Assessment/Plan: ?1. Pain due to onychomycosis of toenails of both feet   ?2. Corns   ?3. Diabetic peripheral neuropathy associated with type 2 diabetes mellitus ([place])   ?  ?-Patient was evaluated and treated. All patient's and/or POA's questions/concerns answered on today's visit. ?-Toenails 1-5 b/l were debrided in length and girth with sterile nail nippers and dremel without iatrogenic bleeding.  ?-Corn(s) right fifth digit pared utilizing sterile scalpel blade without complication or incident. Total number debrided=1. ?-Patient/POA to call should there be question/concern in the interim.  ? ?Return in about 3 months (around 08/02/2021). ? ?Marzetta Board, DPM  ?

## 2021-05-24 DIAGNOSIS — R519 Headache, unspecified: Secondary | ICD-10-CM | POA: Diagnosis not present

## 2021-05-24 DIAGNOSIS — M542 Cervicalgia: Secondary | ICD-10-CM | POA: Diagnosis not present

## 2021-05-24 DIAGNOSIS — R070 Pain in throat: Secondary | ICD-10-CM | POA: Diagnosis not present

## 2021-05-24 DIAGNOSIS — J019 Acute sinusitis, unspecified: Secondary | ICD-10-CM | POA: Diagnosis not present

## 2021-05-29 DIAGNOSIS — M542 Cervicalgia: Secondary | ICD-10-CM | POA: Diagnosis not present

## 2021-06-12 ENCOUNTER — Other Ambulatory Visit: Payer: Self-pay | Admitting: Internal Medicine

## 2021-06-12 DIAGNOSIS — Z1231 Encounter for screening mammogram for malignant neoplasm of breast: Secondary | ICD-10-CM

## 2021-06-14 ENCOUNTER — Ambulatory Visit
Admission: RE | Admit: 2021-06-14 | Discharge: 2021-06-14 | Disposition: A | Discharge status: Home / Self Care | Payer: Medicare Other | Source: Ambulatory Visit | Attending: Internal Medicine | Admitting: Internal Medicine

## 2021-06-14 DIAGNOSIS — Z1231 Encounter for screening mammogram for malignant neoplasm of breast: Secondary | ICD-10-CM | POA: Diagnosis not present

## 2021-06-19 DIAGNOSIS — K5730 Diverticulosis of large intestine without perforation or abscess without bleeding: Secondary | ICD-10-CM | POA: Diagnosis not present

## 2021-06-19 DIAGNOSIS — K5904 Chronic idiopathic constipation: Secondary | ICD-10-CM | POA: Diagnosis not present

## 2021-06-19 DIAGNOSIS — K219 Gastro-esophageal reflux disease without esophagitis: Secondary | ICD-10-CM | POA: Diagnosis not present

## 2021-08-15 ENCOUNTER — Ambulatory Visit: Payer: Medicare Other | Admitting: Podiatry

## 2021-08-18 LAB — GLUCOSE, POCT (MANUAL RESULT ENTRY): POC Glucose: 134 mg/dl — AB (ref 70–99)

## 2021-08-22 DIAGNOSIS — I10 Essential (primary) hypertension: Secondary | ICD-10-CM | POA: Diagnosis not present

## 2021-08-22 DIAGNOSIS — E785 Hyperlipidemia, unspecified: Secondary | ICD-10-CM | POA: Diagnosis not present

## 2021-08-22 DIAGNOSIS — E039 Hypothyroidism, unspecified: Secondary | ICD-10-CM | POA: Diagnosis not present

## 2021-08-22 DIAGNOSIS — I208 Other forms of angina pectoris: Secondary | ICD-10-CM | POA: Diagnosis not present

## 2021-08-22 DIAGNOSIS — E119 Type 2 diabetes mellitus without complications: Secondary | ICD-10-CM | POA: Diagnosis not present

## 2021-08-23 DIAGNOSIS — I10 Essential (primary) hypertension: Secondary | ICD-10-CM | POA: Diagnosis not present

## 2021-08-23 DIAGNOSIS — E039 Hypothyroidism, unspecified: Secondary | ICD-10-CM | POA: Diagnosis not present

## 2021-08-23 DIAGNOSIS — E1165 Type 2 diabetes mellitus with hyperglycemia: Secondary | ICD-10-CM | POA: Diagnosis not present

## 2021-08-23 DIAGNOSIS — E785 Hyperlipidemia, unspecified: Secondary | ICD-10-CM | POA: Diagnosis not present

## 2021-08-28 DIAGNOSIS — E890 Postprocedural hypothyroidism: Secondary | ICD-10-CM | POA: Diagnosis not present

## 2021-08-28 DIAGNOSIS — Z13820 Encounter for screening for osteoporosis: Secondary | ICD-10-CM | POA: Diagnosis not present

## 2021-08-28 DIAGNOSIS — E785 Hyperlipidemia, unspecified: Secondary | ICD-10-CM | POA: Diagnosis not present

## 2021-08-28 DIAGNOSIS — I872 Venous insufficiency (chronic) (peripheral): Secondary | ICD-10-CM | POA: Diagnosis not present

## 2021-08-28 DIAGNOSIS — E876 Hypokalemia: Secondary | ICD-10-CM | POA: Diagnosis not present

## 2021-08-28 DIAGNOSIS — G4733 Obstructive sleep apnea (adult) (pediatric): Secondary | ICD-10-CM | POA: Diagnosis not present

## 2021-08-28 DIAGNOSIS — E1165 Type 2 diabetes mellitus with hyperglycemia: Secondary | ICD-10-CM | POA: Diagnosis not present

## 2021-08-28 DIAGNOSIS — I208 Other forms of angina pectoris: Secondary | ICD-10-CM | POA: Diagnosis not present

## 2021-08-28 DIAGNOSIS — I10 Essential (primary) hypertension: Secondary | ICD-10-CM | POA: Diagnosis not present

## 2021-08-28 DIAGNOSIS — E1142 Type 2 diabetes mellitus with diabetic polyneuropathy: Secondary | ICD-10-CM | POA: Diagnosis not present

## 2021-08-28 DIAGNOSIS — H1013 Acute atopic conjunctivitis, bilateral: Secondary | ICD-10-CM | POA: Diagnosis not present

## 2021-08-30 ENCOUNTER — Other Ambulatory Visit: Payer: Self-pay | Admitting: Family Medicine

## 2021-08-30 DIAGNOSIS — Z13820 Encounter for screening for osteoporosis: Secondary | ICD-10-CM

## 2021-09-04 ENCOUNTER — Ambulatory Visit: Payer: Medicare Other | Admitting: Podiatry

## 2021-09-05 ENCOUNTER — Encounter: Payer: Self-pay | Admitting: Podiatry

## 2021-09-05 ENCOUNTER — Ambulatory Visit (INDEPENDENT_AMBULATORY_CARE_PROVIDER_SITE_OTHER): Payer: Medicare Other | Admitting: Podiatry

## 2021-09-05 DIAGNOSIS — L84 Corns and callosities: Secondary | ICD-10-CM | POA: Diagnosis not present

## 2021-09-05 DIAGNOSIS — B351 Tinea unguium: Secondary | ICD-10-CM

## 2021-09-05 DIAGNOSIS — M79674 Pain in right toe(s): Secondary | ICD-10-CM

## 2021-09-05 DIAGNOSIS — E1142 Type 2 diabetes mellitus with diabetic polyneuropathy: Secondary | ICD-10-CM

## 2021-09-05 DIAGNOSIS — M79675 Pain in left toe(s): Secondary | ICD-10-CM

## 2021-09-09 NOTE — Progress Notes (Signed)
  Subjective:  [name], female    DOB: Jan 02, 1956,  MRN: [id number]  Tiffany Strickland presents to clinic today for at risk foot care with history of diabetic neuropathy and corn(s) right lower extremity, callus(es) b/l lower extremities and painful mycotic nails.  Pain interferes with ambulation. Aggravating factors include wearing enclosed shoe gear. Painful toenails interfere with ambulation. Aggravating factors include wearing enclosed shoe gear. Pain is relieved with periodic professional debridement. Painful corns and calluses are aggravated when weightbearing with and without shoegear. Pain is relieved with periodic professional debridement.  Last A1c was 7.6%. Patient did not check blood glucose today.  New problem(s): None.   PCP is Audley Hose, MD , and last visit was  May 24, 2021  Allergies  Allergen Reactions   Pollen Extract Other (See Comments)    Seasonal allergies   Tomato Itching   Wool Alcohol [Lanolin] Itching    Reaction unknown    Review of Systems: Negative except as noted in the HPI.  Objective:  Tiffany Strickland is a pleasant 66 y.o. female in NAD. AAO X 3.  Vascular Examination: CFT immediate b/l LE. Palpable DP/PT pulses b/l LE. Digital hair present b/l. Skin temperature gradient WNL b/l. No pain with calf compression b/l. No edema noted b/l. No cyanosis or clubbing noted b/l LE.  Dermatological Examination: Pedal integument with normal turgor, texture and tone b/l LE. No open wounds b/l. No interdigital macerations b/l. Toenails 1-5 b/l elongated, thickened, discolored with subungual debris. +Tenderness with dorsal palpation of nailplates. Hyperkeratotic lesion(s) noted R 5th toe and b/l great toes.  Musculoskeletal Examination: Normal muscle strength 5/5 to all lower extremity muscle groups bilaterally. HAV with bunion deformity noted b/l LE. Hammertoe(s) noted to the R 5th toe. No pain, crepitus or joint limitation noted with ROM  b/l LE.  Patient ambulates independently without assistive aids.  Neurological Examination: Protective sensation intact 5/5 intact bilaterally with 10g monofilament b/l. Vibratory sensation intact b/l. Clonus negative b/l.  Assessment/Plan: 1. Pain due to onychomycosis of toenails of both feet   2. Corns and callosities   3. Diabetic peripheral neuropathy associated with type 2 diabetes mellitus ([place])     -Examined patient. -Mycotic toenails 1-5 bilaterally were debrided in length and girth with sterile nail nippers and dremel without incident. -Corn(s) R 5th toe and callus(es) bilateral great toes were pared utilizing sterile scalpel blade without incident. Total number debrided =3. -Patient/POA to call should there be question/concern in the interim.   Return in about 3 months (around 12/06/2021).  Marzetta Board, DPM

## 2021-09-26 DIAGNOSIS — Z23 Encounter for immunization: Secondary | ICD-10-CM | POA: Diagnosis not present

## 2021-09-26 DIAGNOSIS — Z862 Personal history of diseases of the blood and blood-forming organs and certain disorders involving the immune mechanism: Secondary | ICD-10-CM | POA: Diagnosis not present

## 2021-09-26 DIAGNOSIS — Z880 Allergy status to penicillin: Secondary | ICD-10-CM | POA: Diagnosis not present

## 2021-09-26 DIAGNOSIS — S0081XA Abrasion of other part of head, initial encounter: Secondary | ICD-10-CM | POA: Diagnosis not present

## 2021-09-26 DIAGNOSIS — Z884 Allergy status to anesthetic agent status: Secondary | ICD-10-CM | POA: Diagnosis not present

## 2021-11-16 DIAGNOSIS — I872 Venous insufficiency (chronic) (peripheral): Secondary | ICD-10-CM | POA: Diagnosis not present

## 2021-11-16 DIAGNOSIS — L0292 Furuncle, unspecified: Secondary | ICD-10-CM | POA: Diagnosis not present

## 2021-11-16 DIAGNOSIS — E1142 Type 2 diabetes mellitus with diabetic polyneuropathy: Secondary | ICD-10-CM | POA: Diagnosis not present

## 2021-11-16 DIAGNOSIS — R519 Headache, unspecified: Secondary | ICD-10-CM | POA: Diagnosis not present

## 2021-11-16 DIAGNOSIS — J309 Allergic rhinitis, unspecified: Secondary | ICD-10-CM | POA: Diagnosis not present

## 2021-11-16 DIAGNOSIS — R079 Chest pain, unspecified: Secondary | ICD-10-CM | POA: Diagnosis not present

## 2021-11-16 DIAGNOSIS — H1013 Acute atopic conjunctivitis, bilateral: Secondary | ICD-10-CM | POA: Diagnosis not present

## 2021-11-16 DIAGNOSIS — Z77120 Contact with and (suspected) exposure to mold (toxic): Secondary | ICD-10-CM | POA: Diagnosis not present

## 2021-11-20 ENCOUNTER — Other Ambulatory Visit: Payer: Self-pay | Admitting: Family Medicine

## 2021-11-20 ENCOUNTER — Other Ambulatory Visit: Payer: Self-pay | Admitting: Emergency Medicine

## 2021-11-20 ENCOUNTER — Ambulatory Visit
Admission: RE | Admit: 2021-11-20 | Discharge: 2021-11-20 | Disposition: A | Discharge status: Home / Self Care | Payer: Medicare Other | Source: Ambulatory Visit | Attending: Family Medicine | Admitting: Family Medicine

## 2021-11-20 DIAGNOSIS — M47814 Spondylosis without myelopathy or radiculopathy, thoracic region: Secondary | ICD-10-CM | POA: Diagnosis not present

## 2021-11-20 DIAGNOSIS — R079 Chest pain, unspecified: Secondary | ICD-10-CM

## 2021-11-20 DIAGNOSIS — R0989 Other specified symptoms and signs involving the circulatory and respiratory systems: Secondary | ICD-10-CM | POA: Diagnosis not present

## 2021-11-23 ENCOUNTER — Ambulatory Visit (INDEPENDENT_AMBULATORY_CARE_PROVIDER_SITE_OTHER): Payer: Medicare Other | Admitting: Podiatry

## 2021-11-23 ENCOUNTER — Encounter: Payer: Self-pay | Admitting: Podiatry

## 2021-11-23 DIAGNOSIS — E1142 Type 2 diabetes mellitus with diabetic polyneuropathy: Secondary | ICD-10-CM | POA: Diagnosis not present

## 2021-11-23 DIAGNOSIS — B351 Tinea unguium: Secondary | ICD-10-CM

## 2021-11-23 DIAGNOSIS — L84 Corns and callosities: Secondary | ICD-10-CM

## 2021-11-23 DIAGNOSIS — M79674 Pain in right toe(s): Secondary | ICD-10-CM

## 2021-11-23 DIAGNOSIS — M79675 Pain in left toe(s): Secondary | ICD-10-CM

## 2021-11-23 NOTE — Progress Notes (Signed)
  Subjective:  [name], female    DOB: 1955-05-06,  MRN: [id number]  Tiffany Strickland presents to clinic today for:  Chief Complaint  Patient presents with   Nail Problem    Diabetic foot care BS-did not check today A1C-7.6 PCP-Bakare PCP VST-Last week    New problem(s): None. {jgcomplaint:23593}  PCP is Bakare, Mobolaji B, MD , and last visit was  {Time; dates multiple:15870}.  Allergies  Allergen Reactions   Pollen Extract Other (See Comments)    Seasonal allergies   Tomato Itching   Wool Alcohol [Lanolin] Itching    Reaction unknown    Review of Systems: Negative except as noted in the HPI.  Objective: No changes noted in today's physical examination.  Tiffany Strickland is a pleasant 66 y.o. female {jgbodyhabitus:24098} AAO x 3.  Vascular Examination: CFT immediate b/l LE. Palpable DP/PT pulses b/l LE. Digital hair present b/l. Skin temperature gradient WNL b/l. No pain with calf compression b/l. No edema noted b/l. No cyanosis or clubbing noted b/l LE.  Dermatological Examination: Pedal integument with normal turgor, texture and tone b/l LE. No open wounds b/l. No interdigital macerations b/l. Toenails 1-5 b/l elongated, thickened, discolored with subungual debris. +Tenderness with dorsal palpation of nailplates.   Hyperkeratotic lesion(s) noted R 5th toe and b/l great toes.  Musculoskeletal Examination: Normal muscle strength 5/5 to all lower extremity muscle groups bilaterally. HAV with bunion deformity noted b/l LE. Hammertoe(s) noted to the R 5th toe. No pain, crepitus or joint limitation noted with ROM b/l LE.  Patient ambulates independently without assistive aids.  Neurological Examination: Protective sensation intact 5/5 intact bilaterally with 10g monofilament b/l. Vibratory sensation intact b/l. Clonus negative b/l.  Assessment/Plan: 1. Pain due to onychomycosis of toenails of both feet   2. Corns and callosities   3. Diabetic peripheral  neuropathy associated with type 2 diabetes mellitus ([place])     No orders of the defined types were placed in this encounter.   {Jgplan:23602::"-Patient/POA to call should there be question/concern in the interim."}   No follow-ups on file.  Marzetta Board, DPM

## 2021-12-03 ENCOUNTER — Other Ambulatory Visit: Payer: Medicare Other

## 2021-12-10 ENCOUNTER — Telehealth: Payer: Self-pay | Admitting: Podiatry

## 2021-12-10 NOTE — Telephone Encounter (Signed)
Pt called because she missed her appt for diabetic shoe measurements and was wanting to r/s.  I apologized but explained we are not doing any more diabetic shoes measurements for this yr. I asked pt to call back in December and we should have a schedule for that dept. I will add her to waitlist as well.

## 2021-12-14 ENCOUNTER — Encounter (HOSPITAL_BASED_OUTPATIENT_CLINIC_OR_DEPARTMENT_OTHER): Payer: Self-pay

## 2021-12-14 ENCOUNTER — Other Ambulatory Visit: Payer: Self-pay

## 2021-12-14 ENCOUNTER — Ambulatory Visit: Payer: Medicare Other | Admitting: Podiatry

## 2021-12-14 DIAGNOSIS — E119 Type 2 diabetes mellitus without complications: Secondary | ICD-10-CM | POA: Diagnosis not present

## 2021-12-14 DIAGNOSIS — W260XXA Contact with knife, initial encounter: Secondary | ICD-10-CM | POA: Diagnosis not present

## 2021-12-14 DIAGNOSIS — Z23 Encounter for immunization: Secondary | ICD-10-CM | POA: Insufficient documentation

## 2021-12-14 DIAGNOSIS — Z7984 Long term (current) use of oral hypoglycemic drugs: Secondary | ICD-10-CM | POA: Insufficient documentation

## 2021-12-14 DIAGNOSIS — Z7982 Long term (current) use of aspirin: Secondary | ICD-10-CM | POA: Insufficient documentation

## 2021-12-14 DIAGNOSIS — S61211A Laceration without foreign body of left index finger without damage to nail, initial encounter: Secondary | ICD-10-CM | POA: Diagnosis not present

## 2021-12-14 DIAGNOSIS — I10 Essential (primary) hypertension: Secondary | ICD-10-CM | POA: Diagnosis not present

## 2021-12-14 DIAGNOSIS — Z79899 Other long term (current) drug therapy: Secondary | ICD-10-CM | POA: Diagnosis not present

## 2021-12-14 DIAGNOSIS — S6992XA Unspecified injury of left wrist, hand and finger(s), initial encounter: Secondary | ICD-10-CM | POA: Diagnosis present

## 2021-12-14 NOTE — ED Triage Notes (Signed)
Patient here POV from Home.  Endorses Lacerating her Left Second Digit with a Kitchen Knife approximately 30 Minutes ago. 1-2 cm in Length. Bleeding controlled.  Unknown Tetanus Status.   NAD Noted during Triage. A&Ox4. Gcs 15. Ambulatory.

## 2021-12-15 ENCOUNTER — Emergency Department (HOSPITAL_BASED_OUTPATIENT_CLINIC_OR_DEPARTMENT_OTHER)
Admission: EM | Admit: 2021-12-15 | Discharge: 2021-12-15 | Disposition: A | Discharge status: Home / Self Care | Payer: Medicare Other | Attending: Emergency Medicine | Admitting: Emergency Medicine

## 2021-12-15 DIAGNOSIS — S61211A Laceration without foreign body of left index finger without damage to nail, initial encounter: Secondary | ICD-10-CM

## 2021-12-15 DIAGNOSIS — I10 Essential (primary) hypertension: Secondary | ICD-10-CM

## 2021-12-15 MED ORDER — LIDOCAINE HCL 2 % IJ SOLN
10.0000 mL | Freq: Once | INTRAMUSCULAR | Status: AC
  Administered 2021-12-15: 200 mg
  Filled 2021-12-15: qty 40

## 2021-12-15 MED ORDER — TETANUS-DIPHTH-ACELL PERTUSSIS 5-2.5-18.5 LF-MCG/0.5 IM SUSY
0.5000 mL | PREFILLED_SYRINGE | Freq: Once | INTRAMUSCULAR | Status: AC
  Administered 2021-12-15: 0.5 mL via INTRAMUSCULAR
  Filled 2021-12-15: qty 0.5

## 2021-12-15 NOTE — ED Provider Notes (Addendum)
[facility] EMERGENCY DEPT Provider Note   CSN: [id number] Arrival date & time: 12/14/21  1929     History  Chief Complaint  Patient presents with   Laceration    Tiffany Strickland is a 66 y.o. female.  The history is provided by the patient.  Laceration She has history of hypertension, diabetes, hyperlipidemia, stroke comes in after accidentally cutting her left second finger with a knife.  She does not know when her last tetanus immunization was.   Home Medications Prior to Admission medications   Medication Sig Start Date End Date Taking? Authorizing Provider  Accu-Chek FastClix Lancets MISC SMARTSIG:1 Topical Daily 12/28/20   [provider]  amLODipine (NORVASC) 5 MG tablet Take 5 mg by mouth daily. 10/26/19   [provider]  aspirin (ASPIR-81) 81 MG EC tablet Take 1 tablet (81 mg total) by mouth daily. Swallow whole. 11/08/15   Charlott Rakes, MD  azelastine (OPTIVAR) 0.05 % ophthalmic solution SMARTSIG:1 Drop(s) In Eye(s) Twice Daily PRN 05/10/21   [provider]  Azelastine HCl 137 MCG/SPRAY SOLN Place 1 spray into both nostrils 2 (two) times daily. 11/16/21   [provider]  Blood Glucose Monitoring Suppl (RELION PRIME MONITOR) DEVI Use as directed once daily 08/06/18   Charlott Rakes, MD  Blood Glucose Monitoring Suppl (TRUE METRIX METER) DEVI 1 each by Does not apply route daily before breakfast. 07/14/18   Charlott Rakes, MD  diclofenac Sodium (VOLTAREN) 1 % GEL Apply 4 g topically 4 (four) times daily as needed for pain. 09/08/20   [provider]  furosemide (LASIX) 20 MG tablet Take 20 mg by mouth daily as needed. 10/03/20   [provider]  glipiZIDE (GLUCOTROL XL) 10 MG 24 hr tablet Take 10 mg by mouth daily. 08/15/21   [provider]  glucose blood (RELION PRIME TEST) test strip Use as instructed once daily 08/06/18   Charlott Rakes, MD  hydrochlorothiazide (HYDRODIURIL) 25 MG tablet Take 1  tablet (25 mg total) by mouth daily. 07/07/18   Charlott Rakes, MD  ibuprofen (ADVIL) 600 MG tablet Take 600 mg by mouth 3 (three) times daily. 05/24/21   [provider]  isosorbide mononitrate (IMDUR) 60 MG 24 hr tablet Take 60 mg by mouth daily.    [provider]  levothyroxine (EUTHYROX) 150 MCG tablet Take 1 tablet (150 mcg total) by mouth daily before breakfast. Must have office visit for refills. Patient taking differently: Take 150 mcg by mouth daily before breakfast. Must have office visit for refills.0.5 tablet MWF 1 tab SS TU TH 11/10/18   Newlin, Enobong, MD  Lidocaine 3.5 % PTCH Apply 1 patch topically every 12 (twelve) hours as needed for pain. Patient not taking: Reported on 12/12/2020 09/11/20   [provider]  losartan (COZAAR) 100 MG tablet Take 1 tablet (100 mg total) by mouth daily. 07/07/18   Charlott Rakes, MD  Multiple Vitamin (MULTIVITAMIN PO) Take 1 tablet by mouth daily.    [provider]  nitroGLYCERIN (NITROSTAT) 0.4 MG SL tablet Place 1 tablet (0.4 mg total) under the tongue every 5 (five) minutes as needed for chest pain. 07/07/18   Charlott Rakes, MD  potassium chloride (KLOR-CON) 10 MEQ tablet Take 10 mEq by mouth daily. 09/07/20   [provider]  rosuvastatin (CRESTOR) 10 MG tablet Take 10 mg by mouth at bedtime. 11/03/19   [provider]  vitamin C (ASCORBIC ACID) 500 MG tablet Take 500 mg by mouth daily.  [provider]      Allergies    Pollen extract, Tomato, and Wool alcohol [lanolin]    Review of Systems   Review of Systems  All other systems reviewed and are negative.   Physical Exam Updated Vital Signs BP (!) 169/83 (BP Location: Right Arm)   Pulse 68   Temp 97.6 F (36.4 C) (Temporal)   Resp 18   Ht '5\' 5"'$  (1.651 m)   Wt 124.9 kg   SpO2 99%   BMI 45.82 kg/m  Physical Exam Vitals and nursing note reviewed.   66 year old female, resting comfortably and in no acute distress.  Vital signs are significant for elevated blood pressure. Oxygen saturation is 99%, which is normal. Head is normocephalic and atraumatic. PERRLA, EOMI. Oropharynx is clear. Neck is nontender and supple. Back is nontender. Lungs are clear without rales, wheezes, or rhonchi. Chest is nontender. Heart has regular rate and rhythm without murmur. Abdomen is soft, flat, nontender. Extremities: Laceration present across the pad of the left second finger. Skin is warm and dry without rash. Neurologic: Mental status is normal, cranial nerves are intact, moves all extremities equally.   Media Information   Document Information  Photos    12/15/2021 01:02  Attached To:  Hospital Encounter on 12/15/21  Source Information  Delora Fuel, MD  [facility]   ED Results / Procedures / Treatments    Procedures .Marland KitchenLaceration Repair  Date/Time: 12/15/2021 1:39 AM  Performed by: Delora Fuel, MD Authorized by: Delora Fuel, MD   Consent:    Consent obtained:  Verbal   Consent given by:  Patient   Risks, benefits, and alternatives were discussed: yes     Risks discussed:  Infection, pain and poor wound healing   Alternatives discussed:  No treatment Universal protocol:    Procedure explained and questions answered to patient or proxy's satisfaction: yes     Relevant documents present and verified: yes     Required blood products, implants, devices, and special equipment available: yes     Site/side marked: yes     Immediately prior to procedure, a time out was called: yes     Patient identity confirmed:  Verbally with patient and arm band Anesthesia:    Anesthesia method:  Nerve block   Block location:  Left hand   Block needle gauge:  25 G   Block anesthetic:  Lidocaine 2% w/o epi   Block technique:  Digital block   Block injection procedure:  Anatomic landmarks palpated, anatomic landmarks identified, introduced needle, negative aspiration for blood and incremental injection    Block outcome:  Anesthesia achieved Laceration details:    Location:  Finger   Finger location:  L index finger   Length (cm):  2.5   Depth (mm):  3 Pre-procedure details:    Preparation:  Patient was prepped and draped in usual sterile fashion Exploration:    Limited defect created (wound extended): no     Hemostasis achieved with:  Direct pressure   Wound exploration: entire depth of wound visualized     Wound extent: no foreign body, no nerve damage, no tendon damage and no vascular damage     Contaminated: no   Treatment:    Area cleansed with:  Saline   Amount of cleaning:  Standard   Debridement:  None   Undermining:  None   Scar revision: no   Skin repair:    Repair method:  Sutures   Suture size:  5-0  Suture material:  Prolene   Suture technique:  Simple interrupted   Number of sutures:  5 Approximation:    Approximation:  Close Repair type:    Repair type:  Simple Post-procedure details:    Dressing:  Antibiotic ointment and sterile dressing   Procedure completion:  Tolerated well, no immediate complications     Medications Ordered in ED Medications  lidocaine (XYLOCAINE) 2 % (with pres) injection 200 mg (has no administration in time range)  Tdap (BOOSTRIX) injection 0.5 mL (has no administration in time range)    ED Course/ Medical Decision Making/ A&P                           Medical Decision Making Risk Prescription drug management.   Laceration of left second finger closed with sutures.  I have ordered a Tdap booster.  Final Clinical Impression(s) / ED Diagnoses Final diagnoses:  Laceration of left index finger, initial encounter  Elevated blood pressure reading with diagnosis of hypertension    Rx / DC Orders ED Discharge Orders     None         Delora Fuel, MD 20/23/34 3568    Delora Fuel, MD 61/68/37 [phone]

## 2021-12-20 DIAGNOSIS — E1165 Type 2 diabetes mellitus with hyperglycemia: Secondary | ICD-10-CM | POA: Diagnosis not present

## 2021-12-20 DIAGNOSIS — R519 Headache, unspecified: Secondary | ICD-10-CM | POA: Diagnosis not present

## 2021-12-20 DIAGNOSIS — E890 Postprocedural hypothyroidism: Secondary | ICD-10-CM | POA: Diagnosis not present

## 2021-12-20 DIAGNOSIS — Z77120 Contact with and (suspected) exposure to mold (toxic): Secondary | ICD-10-CM | POA: Diagnosis not present

## 2021-12-20 DIAGNOSIS — G4733 Obstructive sleep apnea (adult) (pediatric): Secondary | ICD-10-CM | POA: Diagnosis not present

## 2021-12-20 DIAGNOSIS — J309 Allergic rhinitis, unspecified: Secondary | ICD-10-CM | POA: Diagnosis not present

## 2021-12-20 DIAGNOSIS — R079 Chest pain, unspecified: Secondary | ICD-10-CM | POA: Diagnosis not present

## 2021-12-20 DIAGNOSIS — I872 Venous insufficiency (chronic) (peripheral): Secondary | ICD-10-CM | POA: Diagnosis not present

## 2021-12-20 DIAGNOSIS — E785 Hyperlipidemia, unspecified: Secondary | ICD-10-CM | POA: Diagnosis not present

## 2021-12-20 DIAGNOSIS — E1142 Type 2 diabetes mellitus with diabetic polyneuropathy: Secondary | ICD-10-CM | POA: Diagnosis not present

## 2021-12-20 DIAGNOSIS — I10 Essential (primary) hypertension: Secondary | ICD-10-CM | POA: Diagnosis not present

## 2021-12-26 ENCOUNTER — Encounter: Payer: Self-pay | Admitting: *Deleted

## 2021-12-28 NOTE — Progress Notes (Signed)
Unable to contact pt by phone x 3 attempts but chart review indicates she was seen by PCP, Dr. Maia Petties at [facility] on 08/28/21 and [facility] ED visit notes from November 3-4, 2023 were also sent to Dr. Maia Petties. Pt also being followed by [facility]. Letter sent to pt to confirm her results from 08/18/21 and confirm her continuing f/u with Dr. Maia Petties as her PCP. No additional health equity support indicated at this time.

## 2022-01-01 DIAGNOSIS — E119 Type 2 diabetes mellitus without complications: Secondary | ICD-10-CM | POA: Diagnosis not present

## 2022-01-01 DIAGNOSIS — H2513 Age-related nuclear cataract, bilateral: Secondary | ICD-10-CM | POA: Diagnosis not present

## 2022-01-05 ENCOUNTER — Ambulatory Visit (HOSPITAL_COMMUNITY)
Admission: EM | Admit: 2022-01-05 | Discharge: 2022-01-05 | Disposition: A | Discharge status: Home / Self Care | Payer: Medicare Other

## 2022-01-05 ENCOUNTER — Encounter (HOSPITAL_COMMUNITY): Payer: Self-pay | Admitting: *Deleted

## 2022-01-05 DIAGNOSIS — Z4802 Encounter for removal of sutures: Secondary | ICD-10-CM | POA: Diagnosis not present

## 2022-01-05 DIAGNOSIS — S61211D Laceration without foreign body of left index finger without damage to nail, subsequent encounter: Secondary | ICD-10-CM

## 2022-01-05 NOTE — Discharge Instructions (Signed)
Your wound is healing well.  Can continue with antibiotic ointment Return if you develop concerning symptoms like redness, swelling, or drainage

## 2022-01-05 NOTE — ED Triage Notes (Signed)
Pt presents for suture removal of left index finger, placed 12/15/2021 - states had been out of town so she was unable to get them removed in recommended period of time. Left index finger wound well approximated without S/S infection. Sutures intact.

## 2022-01-05 NOTE — ED Provider Notes (Signed)
[place]    CSN: [id number] Arrival date & time: 01/05/22  1158      History   Chief Complaint Chief Complaint  Patient presents with   Suture / Staple Removal    HPI Tiffany Strickland is a 66 y.o. female.   Pt presents for suture removal.  Reports sutures were placed 12/15/21 after she cut her index finger on a knife while opening a bag of food.  Denies drainage, swelling.  Reports pain has improved.  She has been applying antibiotic ointment.     Past Medical History:  Diagnosis Date   Colon polyp    adenomatous   Diabetes mellitus without complication ([place])    Hyperlipidemia    Hypertension    Sleep apnea    Stroke [facility])    Thyroid disease     Patient Active Problem List   Diagnosis Date Noted   Posterior neck pain 05/29/2021   Chronic idiopathic constipation 06/21/2020   Diverticular disease of colon 06/21/2020   Change in bowel habit 03/15/2020   Epigastric pain 03/15/2020   Gastroesophageal reflux disease 03/15/2020   Other specified bacterial intestinal infections 03/15/2020   Pulmonary HTN ([place]) 06/29/2018   Educated about COVID-19 virus infection 06/29/2018   OSA (obstructive sleep apnea) 02/03/2017   Hypothyroidism 02/02/2017   Chest pain 02/02/2017   Morbid obesity ([place]) 01/27/2017   Plantar fasciitis of left foot 11/08/2015   Non-insulin treated type 2 diabetes mellitus ([place]) 06/08/2015   Colon cancer screening 06/08/2015   Bilateral leg edema 12/15/2014   Blurry vision, bilateral 06/16/2014   Breast cancer screening 06/16/2014   Other specified hypothyroidism 06/16/2014   Other seasonal allergic rhinitis 06/16/2014   History of stroke 01/31/2014   Rectal nodule 09/06/2013   Essential hypertension 08/24/2013   Nonspecific (abnormal) findings on radiological and other examination of gastrointestinal tract 08/12/2013   Lateral epicondylitis of right elbow 07/21/2013   Right shoulder pain 07/21/2013   Sprain of wrist, right  07/21/2013   Unspecified constipation 04/15/2013   Rectal bleeding 04/15/2013    Past Surgical History:  Procedure Laterality Date   ABDOMINAL HYSTERECTOMY     BLADDER SUSPENSION     COLONOSCOPY WITH PROPOFOL N/A 05/05/2020   Procedure: COLONOSCOPY WITH PROPOFOL;  Surgeon: Carol Ada, MD;  Location: [facility];  Service: Endoscopy;  Laterality: N/A;   EUS N/A 08/12/2013   Procedure: LOWER ENDOSCOPIC ULTRASOUND (EUS);  Surgeon: Milus Banister, MD;  Location: [facility];  Service: Endoscopy;  Laterality: N/A;   KNEE SURGERY     POLYPECTOMY  05/05/2020   Procedure: POLYPECTOMY;  Surgeon: Carol Ada, MD;  Location: [facility];  Service: Endoscopy;;   TUBAL LIGATION      OB History     Gravida  6   Para  5   Term  5   Preterm      AB  1   Living  5      SAB  1   IAB      Ectopic      Multiple      Live Births               Home Medications    Prior to Admission medications   Medication Sig Start Date End Date Taking? Authorizing Provider  amLODipine (NORVASC) 5 MG tablet Take 5 mg by mouth daily. 10/26/19  Yes [provider]  aspirin (ASPIR-81) 81 MG EC tablet Take 1 tablet (81 mg total) by mouth daily. Swallow whole.  11/08/15  Yes Charlott Rakes, MD  azelastine (OPTIVAR) 0.05 % ophthalmic solution SMARTSIG:1 Drop(s) In Eye(s) Twice Daily PRN 05/10/21  Yes [provider]  Azelastine HCl 137 MCG/SPRAY SOLN Place 1 spray into both nostrils 2 (two) times daily. 11/16/21  Yes [provider]  diclofenac Sodium (VOLTAREN) 1 % GEL Apply 4 g topically 4 (four) times daily as needed for pain. 09/08/20  Yes [provider]  glipiZIDE (GLUCOTROL XL) 10 MG 24 hr tablet Take 10 mg by mouth daily. 08/15/21  Yes [provider]  hydrochlorothiazide (HYDRODIURIL) 25 MG tablet Take 1 tablet (25 mg total) by mouth daily. 07/07/18  Yes Charlott Rakes, MD  levothyroxine (EUTHYROX) 150 MCG tablet Take 1 tablet (150 mcg total) by  mouth daily before breakfast. Must have office visit for refills. Patient taking differently: Take 150 mcg by mouth daily before breakfast. Must have office visit for refills.0.5 tablet MWF 1 tab SS TU TH 11/10/18  Yes Newlin, Enobong, MD  losartan (COZAAR) 100 MG tablet Take 1 tablet (100 mg total) by mouth daily. 07/07/18  Yes Charlott Rakes, MD  Multiple Vitamin (MULTIVITAMIN PO) Take 1 tablet by mouth daily.   Yes [provider]  potassium chloride (KLOR-CON) 10 MEQ tablet Take 10 mEq by mouth daily. 09/07/20  Yes [provider]  rosuvastatin (CRESTOR) 10 MG tablet Take 10 mg by mouth at bedtime. 11/03/19  Yes [provider]  vitamin C (ASCORBIC ACID) 500 MG tablet Take 500 mg by mouth daily.   Yes [provider]  Accu-Chek FastClix Lancets MISC SMARTSIG:1 Topical Daily 12/28/20   [provider]  Blood Glucose Monitoring Suppl (RELION PRIME MONITOR) DEVI Use as directed once daily 08/06/18   Charlott Rakes, MD  Blood Glucose Monitoring Suppl (TRUE METRIX METER) DEVI 1 each by Does not apply route daily before breakfast. 07/14/18   Charlott Rakes, MD  furosemide (LASIX) 20 MG tablet Take 20 mg by mouth daily as needed. 10/03/20   [provider]  glucose blood (RELION PRIME TEST) test strip Use as instructed once daily 08/06/18   Charlott Rakes, MD  ibuprofen (ADVIL) 600 MG tablet Take 600 mg by mouth 3 (three) times daily. 05/24/21   [provider]  isosorbide mononitrate (IMDUR) 60 MG 24 hr tablet Take 60 mg by mouth daily.    [provider]  Lidocaine 3.5 % PTCH Apply 1 patch topically every 12 (twelve) hours as needed for pain. Patient not taking: Reported on 12/12/2020 09/11/20   [provider]  nitroGLYCERIN (NITROSTAT) 0.4 MG SL tablet Place 1 tablet (0.4 mg total) under the tongue every 5 (five) minutes as needed for chest pain. 07/07/18   Charlott Rakes, MD    Family History Family History  Problem  Relation Age of Onset   Hypertension Mother    Heart disease Maternal Grandmother    Cancer Maternal Grandmother        ovarian   Cancer Maternal Grandfather    Hypertension Other    Hyperlipidemia Other    Cancer Other    Sleep apnea Other    Obesity Other     Social History Social History   Tobacco Use   Smoking status: Never   Smokeless tobacco: Never  Vaping Use   Vaping Use: Never used  Substance Use Topics   Alcohol use: No   Drug use: No     Allergies   Pollen extract, Tomato, and Wool alcohol [lanolin]   Review of Systems Review  of Systems  Constitutional:  Negative for chills and fever.  HENT:  Negative for ear pain and sore throat.   Eyes:  Negative for pain and visual disturbance.  Respiratory:  Negative for cough and shortness of breath.   Cardiovascular:  Negative for chest pain and palpitations.  Gastrointestinal:  Negative for abdominal pain and vomiting.  Genitourinary:  Negative for dysuria and hematuria.  Musculoskeletal:  Negative for arthralgias and back pain.  Skin:  Positive for wound. Negative for color change and rash.  Neurological:  Negative for seizures and syncope.  All other systems reviewed and are negative.    Physical Exam Triage Vital Signs ED Triage Vitals  Enc Vitals Group     BP 01/05/22 1313 (!) 150/65     Pulse Rate 01/05/22 1313 72     Resp 01/05/22 1313 18     Temp 01/05/22 1313 97.9 F (36.6 C)     Temp Source 01/05/22 1313 Oral     SpO2 01/05/22 1313 98 %     Weight --      Height --      Head Circumference --      Peak Flow --      Pain Score 01/05/22 1317 0     Pain Loc --      Pain Edu? --      Excl. in [place]? --    No data found.  Updated Vital Signs BP (!) 150/65   Pulse 72   Temp 97.9 F (36.6 C) (Oral)   Resp 18   SpO2 98%   Visual Acuity Right Eye Distance:   Left Eye Distance:   Bilateral Distance:    Right Eye Near:   Left Eye Near:    Bilateral Near:     Physical Exam Vitals and  nursing note reviewed.  Constitutional:      General: She is not in acute distress.    Appearance: She is well-developed.  HENT:     Head: Normocephalic and atraumatic.  Eyes:     Conjunctiva/sclera: Conjunctivae normal.  Cardiovascular:     Rate and Rhythm: Normal rate and regular rhythm.     Heart sounds: No murmur heard. Pulmonary:     Effort: Pulmonary effort is normal. No respiratory distress.     Breath sounds: Normal breath sounds.  Abdominal:     Palpations: Abdomen is soft.     Tenderness: There is no abdominal tenderness.  Musculoskeletal:        General: No swelling.     Cervical back: Neck supple.  Skin:    General: Skin is warm and dry.     Capillary Refill: Capillary refill takes less than 2 seconds.     Comments: Left index finger with 5 sutures in place to dorsal aspect of distal finger.  Wound healing well, no dehiscence, no signs of infection  Neurological:     Mental Status: She is alert.  Psychiatric:        Mood and Affect: Mood normal.      UC Treatments / Results  Labs (all labs ordered are listed, but only abnormal results are displayed) Labs Reviewed - No data to display  EKG   Radiology No results found.  Procedures Procedures (including critical care time)  Medications Ordered in UC Medications - No data to display  Initial Impression / Assessment and Plan / UC Course  I have reviewed the triage vital signs and the nursing notes.  Pertinent labs & imaging results  that were available during my care of the patient were reviewed by me and considered in my medical decision making (see chart for details).     Sutures removed in clinic.  Wound healing well with no signs of infection.  Wound care discussed. Return precautions discussed.  Final Clinical Impressions(s) / UC Diagnoses   Final diagnoses:  Visit for suture removal     Discharge Instructions      Your wound is healing well.  Can continue with antibiotic  ointment Return if you develop concerning symptoms like redness, swelling, or drainage    ED Prescriptions   None    PDMP not reviewed this encounter.   Ward, Lenise Arena, PA-C 01/05/22 1645

## 2022-02-06 DIAGNOSIS — R251 Tremor, unspecified: Secondary | ICD-10-CM | POA: Diagnosis not present

## 2022-02-06 DIAGNOSIS — Z23 Encounter for immunization: Secondary | ICD-10-CM | POA: Diagnosis not present

## 2022-02-06 DIAGNOSIS — K039 Disease of hard tissues of teeth, unspecified: Secondary | ICD-10-CM | POA: Diagnosis not present

## 2022-02-06 DIAGNOSIS — M542 Cervicalgia: Secondary | ICD-10-CM | POA: Diagnosis not present

## 2022-02-06 DIAGNOSIS — Z1211 Encounter for screening for malignant neoplasm of colon: Secondary | ICD-10-CM | POA: Diagnosis not present

## 2022-02-06 DIAGNOSIS — E1165 Type 2 diabetes mellitus with hyperglycemia: Secondary | ICD-10-CM | POA: Diagnosis not present

## 2022-02-06 DIAGNOSIS — Z13820 Encounter for screening for osteoporosis: Secondary | ICD-10-CM | POA: Diagnosis not present

## 2022-02-06 DIAGNOSIS — Z1239 Encounter for other screening for malignant neoplasm of breast: Secondary | ICD-10-CM | POA: Diagnosis not present

## 2022-02-06 DIAGNOSIS — Z0001 Encounter for general adult medical examination with abnormal findings: Secondary | ICD-10-CM | POA: Diagnosis not present

## 2022-02-06 DIAGNOSIS — I10 Essential (primary) hypertension: Secondary | ICD-10-CM | POA: Diagnosis not present

## 2022-02-07 DIAGNOSIS — J019 Acute sinusitis, unspecified: Secondary | ICD-10-CM | POA: Diagnosis not present

## 2022-02-07 DIAGNOSIS — K0500 Acute gingivitis, plaque induced: Secondary | ICD-10-CM | POA: Diagnosis not present

## 2022-02-07 DIAGNOSIS — I10 Essential (primary) hypertension: Secondary | ICD-10-CM | POA: Diagnosis not present

## 2022-02-08 ENCOUNTER — Ambulatory Visit
Admission: RE | Admit: 2022-02-08 | Discharge: 2022-02-08 | Disposition: A | Discharge status: Home / Self Care | Payer: Medicare Other | Source: Ambulatory Visit | Attending: Family Medicine | Admitting: Family Medicine

## 2022-02-08 DIAGNOSIS — Z780 Asymptomatic menopausal state: Secondary | ICD-10-CM | POA: Diagnosis not present

## 2022-02-08 DIAGNOSIS — Z13820 Encounter for screening for osteoporosis: Secondary | ICD-10-CM

## 2022-02-13 ENCOUNTER — Ambulatory Visit: Payer: Medicare Other | Admitting: *Deleted

## 2022-02-13 NOTE — Progress Notes (Signed)
Patient presents to the office today for diabetic shoe and insole measuring.  Patient was measured with brannock device to determine size and width for 1 pair of extra depth shoes and foam casted for 3 pair of insoles.   ABN signed.   Documentation of medical necessity will be sent to patient's treating diabetic doctor to verify and sign.   Patient's diabetic provider: Audley Hose, MD   Shoes and insoles will be ordered at that time and patient will be notified for an appointment for fitting when they arrive.   Brannock measurement: 9 Wide  Patient shoe selection-   1st   Shoe choice:   882 ORTHOFEET  2nd  Shoe choice:   [place] size ordered: 9.5 WIDE

## 2022-02-18 ENCOUNTER — Other Ambulatory Visit (INDEPENDENT_AMBULATORY_CARE_PROVIDER_SITE_OTHER): Payer: Medicare Other | Admitting: Podiatry

## 2022-02-18 DIAGNOSIS — M2040 Other hammer toe(s) (acquired), unspecified foot: Secondary | ICD-10-CM

## 2022-02-18 DIAGNOSIS — L84 Corns and callosities: Secondary | ICD-10-CM

## 2022-02-18 DIAGNOSIS — M2011 Hallux valgus (acquired), right foot: Secondary | ICD-10-CM

## 2022-02-18 DIAGNOSIS — E1142 Type 2 diabetes mellitus with diabetic polyneuropathy: Secondary | ICD-10-CM

## 2022-02-18 DIAGNOSIS — M2012 Hallux valgus (acquired), left foot: Secondary | ICD-10-CM

## 2022-02-18 NOTE — Progress Notes (Signed)
Order entered for one pair of diabetic shoes with 3 pair total contact insoles. 1. Corns   2. Hallux valgus, acquired, bilateral   3. Acquired hammertoe   4. Diabetic peripheral neuropathy associated with type 2 diabetes mellitus ([place])

## 2022-03-11 ENCOUNTER — Encounter: Payer: Self-pay | Admitting: Podiatry

## 2022-03-11 ENCOUNTER — Ambulatory Visit (INDEPENDENT_AMBULATORY_CARE_PROVIDER_SITE_OTHER): Payer: 59 | Admitting: Podiatry

## 2022-03-11 VITALS — BP 136/63 | HR 78

## 2022-03-11 DIAGNOSIS — M79675 Pain in left toe(s): Secondary | ICD-10-CM

## 2022-03-11 DIAGNOSIS — M79674 Pain in right toe(s): Secondary | ICD-10-CM

## 2022-03-11 DIAGNOSIS — E1142 Type 2 diabetes mellitus with diabetic polyneuropathy: Secondary | ICD-10-CM | POA: Diagnosis not present

## 2022-03-11 DIAGNOSIS — B351 Tinea unguium: Secondary | ICD-10-CM

## 2022-03-11 NOTE — Progress Notes (Signed)
  Subjective:  [name], female    DOB: Feb 12, 1955,   MRN: [id number]  Chief Complaint  Patient presents with   Diabetes    Diabetic foot care, A1c-7.6 BG- 126, nail trim     67 y.o. female presents for concern of thickened elongated and painful nails that are difficult to trim. Requesting to have them trimmed today. Relates burning and tingling in their feet. Patient is diabetic and last A1c was  Lab Results  Component Value Date   HGBA1C 6.8 (H) 09/09/2018   .   PCP:  Audley Hose, MD    . Denies any other pedal complaints. Denies n/v/f/c.   Past Medical History:  Diagnosis Date   Colon polyp    adenomatous   Diabetes mellitus without complication ([place])    Hyperlipidemia    Hypertension    Sleep apnea    Stroke [facility])    Thyroid disease     Objective:  Physical Exam: Vascular: DP/PT pulses 2/4 bilateral. CFT <3 seconds. Absent hair growth on digits. Edema noted to bilateral lower extremities. Xerosis noted bilaterally.  Skin. No lacerations or abrasions bilateral feet. Nails 1-5 bilateral  are thickened discolored and elongated with subungual debris.  Musculoskeletal: MMT 5/5 bilateral lower extremities in DF, PF, Inversion and Eversion. Deceased ROM in DF of ankle joint.  Neurological: Sensation intact to light touch. Protective sensation diminished bilateral.    Assessment:  No diagnosis found.   Plan:  Patient was evaluated and treated and all questions answered. -Discussed and educated patient on diabetic foot care, especially with  regards to the vascular, neurological and musculoskeletal systems.  -Stressed the importance of good glycemic control and the detriment of not  controlling glucose levels in relation to the foot. -Discussed supportive shoes at all times and checking feet regularly.  -Mechanically debrided all nails 1-5 bilateral using sterile nail nipper and filed with dremel without incident  -Answered all patient  questions -Patient to return  in 3 months for at risk foot care -Patient advised to call the office if any problems or questions arise in the meantime.   Lorenda Peck, DPM

## 2022-03-12 DIAGNOSIS — E890 Postprocedural hypothyroidism: Secondary | ICD-10-CM | POA: Diagnosis not present

## 2022-03-12 DIAGNOSIS — E785 Hyperlipidemia, unspecified: Secondary | ICD-10-CM | POA: Diagnosis not present

## 2022-03-15 ENCOUNTER — Ambulatory Visit: Payer: Medicare Other | Admitting: Podiatry

## 2022-03-15 DIAGNOSIS — I739 Peripheral vascular disease, unspecified: Secondary | ICD-10-CM | POA: Diagnosis not present

## 2022-03-15 DIAGNOSIS — I2089 Other forms of angina pectoris: Secondary | ICD-10-CM | POA: Diagnosis not present

## 2022-03-15 DIAGNOSIS — M542 Cervicalgia: Secondary | ICD-10-CM | POA: Diagnosis not present

## 2022-03-15 DIAGNOSIS — E1165 Type 2 diabetes mellitus with hyperglycemia: Secondary | ICD-10-CM | POA: Diagnosis not present

## 2022-03-15 DIAGNOSIS — E876 Hypokalemia: Secondary | ICD-10-CM | POA: Diagnosis not present

## 2022-03-15 DIAGNOSIS — I872 Venous insufficiency (chronic) (peripheral): Secondary | ICD-10-CM | POA: Diagnosis not present

## 2022-03-15 DIAGNOSIS — E785 Hyperlipidemia, unspecified: Secondary | ICD-10-CM | POA: Diagnosis not present

## 2022-03-15 DIAGNOSIS — J019 Acute sinusitis, unspecified: Secondary | ICD-10-CM | POA: Diagnosis not present

## 2022-03-15 DIAGNOSIS — E890 Postprocedural hypothyroidism: Secondary | ICD-10-CM | POA: Diagnosis not present

## 2022-03-15 DIAGNOSIS — E1142 Type 2 diabetes mellitus with diabetic polyneuropathy: Secondary | ICD-10-CM | POA: Diagnosis not present

## 2022-03-15 DIAGNOSIS — I10 Essential (primary) hypertension: Secondary | ICD-10-CM | POA: Diagnosis not present

## 2022-03-20 ENCOUNTER — Ambulatory Visit (INDEPENDENT_AMBULATORY_CARE_PROVIDER_SITE_OTHER): Payer: 59

## 2022-03-20 DIAGNOSIS — E119 Type 2 diabetes mellitus without complications: Secondary | ICD-10-CM

## 2022-03-20 DIAGNOSIS — M2012 Hallux valgus (acquired), left foot: Secondary | ICD-10-CM

## 2022-03-20 DIAGNOSIS — M2040 Other hammer toe(s) (acquired), unspecified foot: Secondary | ICD-10-CM

## 2022-03-20 DIAGNOSIS — M2011 Hallux valgus (acquired), right foot: Secondary | ICD-10-CM

## 2022-03-20 NOTE — Progress Notes (Signed)
Patient presents today to pick up diabetic shoes and insoles.  She tried on the shoes with the insoles and the fit was too tight  New order submitted for 882 orthofeet 9.5 xw.

## 2022-04-09 ENCOUNTER — Telehealth: Payer: Self-pay | Admitting: Podiatry

## 2022-04-09 NOTE — Telephone Encounter (Signed)
Tried to reach patient to pick up diabetic shoes, no answer no vm

## 2022-04-24 ENCOUNTER — Ambulatory Visit (INDEPENDENT_AMBULATORY_CARE_PROVIDER_SITE_OTHER): Payer: 59

## 2022-04-24 DIAGNOSIS — M2011 Hallux valgus (acquired), right foot: Secondary | ICD-10-CM | POA: Diagnosis not present

## 2022-04-24 DIAGNOSIS — M2012 Hallux valgus (acquired), left foot: Secondary | ICD-10-CM | POA: Diagnosis not present

## 2022-04-24 DIAGNOSIS — E119 Type 2 diabetes mellitus without complications: Secondary | ICD-10-CM | POA: Diagnosis not present

## 2022-04-24 DIAGNOSIS — M2040 Other hammer toe(s) (acquired), unspecified foot: Secondary | ICD-10-CM

## 2022-04-24 NOTE — Progress Notes (Signed)
Patient presents today to pick up diabetic shoes and insoles.  Patient was dispensed 1 pair of diabetic shoes and 3 pairs of foam casted diabetic insoles.   She tried on the shoes with the insoles and the fit was satisfactory.   Will follow up next year for new order.    

## 2022-04-29 ENCOUNTER — Other Ambulatory Visit: Payer: Self-pay | Admitting: Family

## 2022-04-29 DIAGNOSIS — Z1231 Encounter for screening mammogram for malignant neoplasm of breast: Secondary | ICD-10-CM

## 2022-04-30 ENCOUNTER — Ambulatory Visit: Payer: 59 | Admitting: Podiatry

## 2022-04-30 ENCOUNTER — Ambulatory Visit (INDEPENDENT_AMBULATORY_CARE_PROVIDER_SITE_OTHER): Payer: 59 | Admitting: Podiatry

## 2022-04-30 ENCOUNTER — Encounter: Payer: Self-pay | Admitting: Podiatry

## 2022-04-30 ENCOUNTER — Ambulatory Visit (INDEPENDENT_AMBULATORY_CARE_PROVIDER_SITE_OTHER): Payer: 59

## 2022-04-30 DIAGNOSIS — M25572 Pain in left ankle and joints of left foot: Secondary | ICD-10-CM

## 2022-04-30 DIAGNOSIS — M7752 Other enthesopathy of left foot: Secondary | ICD-10-CM

## 2022-04-30 NOTE — Progress Notes (Signed)
  Subjective:  [name], female    DOB: 01-29-1956,   MRN: [id number]  Chief Complaint  Patient presents with   Ankle Pain    RM 19  Left ankle weakness x 6 months. Pt states there is edema that is recurrent. Pt states her ankle feel brusied but no known injury.      67 y.o. female presents for concern as above. Relates the pain will come and go and usually rotating her ankle will help calm it down. She has been taking tylenol and that helps some.   Denies any other pedal complaints. Denies n/v/f/c.   Past Medical History:  Diagnosis Date   Colon polyp    adenomatous   Diabetes mellitus without complication ([place])    Hyperlipidemia    Hypertension    Sleep apnea    Stroke [facility])    Thyroid disease     Objective:  Physical Exam: Vascular: DP/PT pulses 2/4 bilateral. CFT <3 seconds. Absent hair growth on digits. Edema noted to bilateral lower extremities. Xerosis noted bilaterally.  Skin. No lacerations or abrasions bilateral feet. Nails 1-5 bilateral  are thickened discolored and elongated with subungual debris.  Musculoskeletal: MMT 5/5 bilateral lower extremities in DF, PF, Inversion and Eversion. Deceased ROM in DF of ankle joint.  Tender over anterior ankle joint line and some pain to lateral peeroneal tendons and PT tendon and some pain along the achilles tendon. No pain with MMT. No pain with anterior drawer. Neurological: Sensation intact to light touch. Protective sensation diminished bilateral.    Assessment:   1. Left ankle pain, unspecified chronicity      Plan:  Patient was evaluated and treated and all questions answered. X-rays reviewed and discussed with patient. No acute fractures or dislocations noted. Degenerative changes noted in midfoot area. There may be some joint space narrowing of the ankle noted. No acute fractures or dislocations.  Discussed capsulitis and inflammation of joint and treatment options with patient.  Radiographs reviewed  and discussed with patient.  Injection offered today. Patient defers today. .  Trilock brace dispensed.  Discussed if pain does not improve may consider PT and/or MRI for further surgical planning.  Patient to return in 6 weeks or sooner if concerns arise.     Lorenda Peck, DPM

## 2022-05-06 ENCOUNTER — Other Ambulatory Visit: Payer: Self-pay | Admitting: Podiatry

## 2022-05-06 DIAGNOSIS — M7752 Other enthesopathy of left foot: Secondary | ICD-10-CM

## 2022-05-06 DIAGNOSIS — M25572 Pain in left ankle and joints of left foot: Secondary | ICD-10-CM

## 2022-06-10 ENCOUNTER — Ambulatory Visit (INDEPENDENT_AMBULATORY_CARE_PROVIDER_SITE_OTHER): Payer: 59 | Admitting: Podiatry

## 2022-06-10 VITALS — BP 136/67

## 2022-06-10 DIAGNOSIS — E1142 Type 2 diabetes mellitus with diabetic polyneuropathy: Secondary | ICD-10-CM

## 2022-06-10 DIAGNOSIS — M2012 Hallux valgus (acquired), left foot: Secondary | ICD-10-CM

## 2022-06-10 DIAGNOSIS — M79675 Pain in left toe(s): Secondary | ICD-10-CM | POA: Diagnosis not present

## 2022-06-10 DIAGNOSIS — M2011 Hallux valgus (acquired), right foot: Secondary | ICD-10-CM | POA: Diagnosis not present

## 2022-06-10 DIAGNOSIS — M2040 Other hammer toe(s) (acquired), unspecified foot: Secondary | ICD-10-CM | POA: Diagnosis not present

## 2022-06-10 DIAGNOSIS — M79674 Pain in right toe(s): Secondary | ICD-10-CM

## 2022-06-10 DIAGNOSIS — B351 Tinea unguium: Secondary | ICD-10-CM | POA: Diagnosis not present

## 2022-06-10 DIAGNOSIS — E119 Type 2 diabetes mellitus without complications: Secondary | ICD-10-CM

## 2022-06-10 NOTE — Progress Notes (Signed)
ANNUAL DIABETIC FOOT EXAM  Subjective: Tiffany Strickland presents today {jgcomplaint:23593}.  Chief Complaint  Patient presents with   Nail Problem    [facility] BS- did not check today A1C-6.4 PCP-Daye., [place] ([facility]) PCP VST-"Couple weeks ago'    Patient confirms h/o diabetes.  Patient relates {Numbers; 0-100:15068} year h/o diabetes.  Patient denies any h/o foot wounds.  Patient has h/o foot ulcer of {jgPodToeLocator:23637}, which healed via help of ***.  Patient has h/o amputation(s): {jgamp:23617}.  Patient endorses symptoms of foot numbness.   Patient endorses symptoms of foot tingling.  Patient endorses symptoms of burning in feet.  Patient endorses symptoms of pins/needles sensation in feet.  Patient denies any numbness, tingling, burning, or pins/needle sensation in feet.  Patient has been diagnosed with neuropathy.  Risk factors: {jgriskfactors:24044}.  Harvest [place], MD is patient's PCP.  Past Medical History:  Diagnosis Date   Colon polyp    adenomatous   Diabetes mellitus without complication (HCC)    Hyperlipidemia    Hypertension    Sleep apnea    Stroke [facility])    Thyroid disease    Patient Active Problem List   Diagnosis Date Noted   Posterior neck pain 05/29/2021   Chronic idiopathic constipation 06/21/2020   Diverticular disease of colon 06/21/2020   Change in bowel habit 03/15/2020   Epigastric pain 03/15/2020   Gastroesophageal reflux disease 03/15/2020   Other specified bacterial intestinal infections 03/15/2020   Pulmonary HTN (HCC) 06/29/2018   Educated about COVID-19 virus infection 06/29/2018   OSA (obstructive sleep apnea) 02/03/2017   Hypothyroidism 02/02/2017   Chest pain 02/02/2017   Morbid obesity (HCC) 01/27/2017   Plantar fasciitis of left foot 11/08/2015   Non-insulin treated type 2 diabetes mellitus (HCC) 06/08/2015   Colon cancer screening 06/08/2015   Bilateral leg edema 12/15/2014   Blurry vision,  bilateral 06/16/2014   Breast cancer screening 06/16/2014   Other specified hypothyroidism 06/16/2014   Other seasonal allergic rhinitis 06/16/2014   History of stroke 01/31/2014   Rectal nodule 09/06/2013   Essential hypertension 08/24/2013   Nonspecific (abnormal) findings on radiological and other examination of gastrointestinal tract 08/12/2013   Lateral epicondylitis of right elbow 07/21/2013   Right shoulder pain 07/21/2013   Sprain of wrist, right 07/21/2013   Unspecified constipation 04/15/2013   Rectal bleeding 04/15/2013   Past Surgical History:  Procedure Laterality Date   ABDOMINAL HYSTERECTOMY     BLADDER SUSPENSION     COLONOSCOPY WITH PROPOFOL N/A 05/05/2020   Procedure: COLONOSCOPY WITH PROPOFOL;  Surgeon: Jeani Hawking, MD;  [facility];  Service: Endoscopy;  Laterality: N/A;   EUS N/A 08/12/2013   Procedure: LOWER ENDOSCOPIC ULTRASOUND (EUS);  Surgeon: Rachael Fee, MD;  Location: [facility];  Service: Endoscopy;  Laterality: N/A;   KNEE SURGERY     POLYPECTOMY  05/05/2020   Procedure: POLYPECTOMY;  Surgeon: Jeani Hawking, MD;  [facility];  Service: Endoscopy;;   TUBAL LIGATION     Current Outpatient Medications on File Prior to Visit  Medication Sig Dispense Refill   Accu-Chek FastClix Lancets MISC SMARTSIG:1 Topical Daily     amLODipine (NORVASC) 5 MG tablet Take 5 mg by mouth daily.     aspirin (ASPIR-81) 81 MG EC tablet Take 1 tablet (81 mg total) by mouth daily. Swallow whole. 30 tablet 12   azelastine (OPTIVAR) 0.05 % ophthalmic solution SMARTSIG:1 Drop(s) In Eye(s) Twice Daily PRN     Azelastine HCl 137 MCG/SPRAY SOLN Place 1 spray  into both nostrils 2 (two) times daily.     Blood Glucose Monitoring Suppl (RELION PRIME MONITOR) DEVI Use as directed once daily 1 each 0   Blood Glucose Monitoring Suppl (TRUE METRIX METER) DEVI 1 each by Does not apply route daily before breakfast. 1 Device 0   diclofenac Sodium (VOLTAREN) 1 % GEL  Apply 4 g topically 4 (four) times daily as needed for pain.     furosemide (LASIX) 20 MG tablet Take 20 mg by mouth daily as needed.     glipiZIDE (GLUCOTROL XL) 10 MG 24 hr tablet Take 10 mg by mouth daily.     glucose blood (RELION PRIME TEST) test strip Use as instructed once daily 100 each 12   hydrochlorothiazide (HYDRODIURIL) 25 MG tablet Take 1 tablet (25 mg total) by mouth daily. 30 tablet 6   ibuprofen (ADVIL) 600 MG tablet Take 600 mg by mouth 3 (three) times daily.     isosorbide mononitrate (IMDUR) 60 MG 24 hr tablet Take 60 mg by mouth daily.     levothyroxine (EUTHYROX) 150 MCG tablet Take 1 tablet (150 mcg total) by mouth daily before breakfast. Must have office visit for refills. (Patient taking differently: Take 150 mcg by mouth daily before breakfast. Must have office visit for refills.0.5 tablet MWF 1 tab SS TU TH) 30 tablet 0   Lidocaine 3.5 % PTCH Apply 1 patch topically every 12 (twelve) hours as needed for pain. (Patient not taking: Reported on 12/12/2020)     losartan (COZAAR) 100 MG tablet Take 1 tablet (100 mg total) by mouth daily. 30 tablet 6   Multiple Vitamin (MULTIVITAMIN PO) Take 1 tablet by mouth daily.     nitroGLYCERIN (NITROSTAT) 0.4 MG SL tablet Place 1 tablet (0.4 mg total) under the tongue every 5 (five) minutes as needed for chest pain. 20 tablet 0   potassium chloride (KLOR-CON) 10 MEQ tablet Take 10 mEq by mouth daily.     rosuvastatin (CRESTOR) 10 MG tablet Take 10 mg by mouth at bedtime.     vitamin C (ASCORBIC ACID) 500 MG tablet Take 500 mg by mouth daily.     No current facility-administered medications on file prior to visit.    Allergies  Allergen Reactions   Pollen Extract Other (See Comments)    Seasonal allergies   Tomato Itching   Wool Alcohol [Lanolin] Itching    Reaction unknown   Social History   Occupational History   Not on file  Tobacco Use   Smoking status: Never   Smokeless tobacco: Never  Vaping Use   Vaping Use: Never  used  Substance and Sexual Activity   Alcohol use: No   Drug use: No   Sexual activity: Not on file   Family History  Problem Relation Age of Onset   Hypertension Mother    Heart disease Maternal Grandmother    Cancer Maternal Grandmother        ovarian   Cancer Maternal Grandfather    Hypertension Other    Hyperlipidemia Other    Cancer Other    Sleep apnea Other    Obesity Other    Immunization History  Administered Date(s) Administered   Influenza Split 12/11/2012   Influenza,inj,Quad PF,6+ Mos 12/11/2012, 01/31/2014, 11/08/2015, 01/27/2017, 11/06/2017   Pneumococcal Polysaccharide-23 01/27/2017   Tdap 12/15/2021     Review of Systems: Negative except as noted in the HPI.   Objective: Vitals:   06/10/22 0955  BP: 136/67    [name]  is a pleasant 67 y.o. female in NAD. AAO X 3.  Vascular Examination: {jgvascular:23595}  Dermatological Examination: {jgderm:23598}  Neurological Examination: {jgneuro:23601::"Protective sensation intact 5/5 intact bilaterally with 10g monofilament b/l.","Vibratory sensation intact b/l.","Proprioception intact bilaterally."}  Musculoskeletal Examination: {jgmsk:23600}  Footwear Assessment: Does the patient wear appropriate shoes? {Yes,No}. Does the patient need inserts/orthotics? {Yes,No}.  Lab Results  Component Value Date   HGBA1C 6.8 (H) 09/09/2018   ADA Risk Categorization: Low Risk :  Patient has all of the following: Intact protective sensation No prior foot ulcer  No severe deformity Pedal pulses present  High Risk  Patient has one or more of the following: Loss of protective sensation Absent pedal pulses Severe Foot deformity History of foot ulcer  Assessment: 1. Pain due to onychomycosis of toenails of both feet   2. Hallux valgus, acquired, bilateral   3. Acquired hammertoe   4. Diabetic peripheral neuropathy associated with type 2 diabetes mellitus (HCC)   5. Encounter for diabetic foot exam  (HCC)      Plan: No orders of the defined types were placed in this encounter.   No orders of the defined types were placed in this encounter.   None  {jgplan:23602::"-Patient/POA to call should there be question/concern in the interim."} No follow-ups on file.  Freddie Breech, DPM

## 2022-06-11 ENCOUNTER — Encounter: Payer: Self-pay | Admitting: Podiatry

## 2022-06-11 ENCOUNTER — Ambulatory Visit (INDEPENDENT_AMBULATORY_CARE_PROVIDER_SITE_OTHER): Payer: 59 | Admitting: Podiatry

## 2022-06-11 DIAGNOSIS — M7752 Other enthesopathy of left foot: Secondary | ICD-10-CM

## 2022-06-11 DIAGNOSIS — M25572 Pain in left ankle and joints of left foot: Secondary | ICD-10-CM | POA: Diagnosis not present

## 2022-06-11 NOTE — Progress Notes (Signed)
  Subjective:  [name], female    DOB: Jun 04, 1955,   MRN: [id number]  Chief Complaint  Patient presents with   Follow-up    Patient here for follow up on left ankle pain, patient stated she's doing good, Pain comes and goes but not often, patient wearing an elastic ankle brace and compression socks which she states helps     67 y.o. female presents for follow-up of left ankle pain and relates as above.    Denies any other pedal complaints. Denies n/v/f/c.   Past Medical History:  Diagnosis Date   Colon polyp    adenomatous   Diabetes mellitus without complication (HCC)    Hyperlipidemia    Hypertension    Sleep apnea    Stroke [facility])    Thyroid disease     Objective:  Physical Exam: Vascular: DP/PT pulses 2/4 bilateral. CFT <3 seconds. Absent hair growth on digits. Edema noted to bilateral lower extremities. Xerosis noted bilaterally.  Skin. No lacerations or abrasions bilateral feet. Nails 1-5 bilateral  are thickened discolored and elongated with subungual debris.  Musculoskeletal: MMT 5/5 bilateral lower extremities in DF, PF, Inversion and Eversion. Deceased ROM in DF of ankle joint.  Non tender over anterior ankle joint line and some pain to lateral peeroneal tendons and PT tendon and some pain along the achilles tendon. No pain with MMT. No pain with anterior drawer. Neurological: Sensation intact to light touch. Protective sensation diminished bilateral.    Assessment:   1. Capsulitis of left ankle   2. Left ankle pain, unspecified chronicity       Plan:  Patient was evaluated and treated and all questions answered. X-rays reviewed and discussed with patient. No acute fractures or dislocations noted. Degenerative changes noted in midfoot area. There may be some joint space narrowing of the ankle noted. No acute fractures or dislocations.  Discussed capsulitis and inflammation of joint and treatment options with patient.  Radiographs reviewed and  discussed with patient.  Continue with brace and compression stockings.  Patient to return as needed.     Louann Sjogren, DPM

## 2022-06-17 ENCOUNTER — Other Ambulatory Visit: Payer: 59

## 2022-06-17 ENCOUNTER — Ambulatory Visit
Admission: RE | Admit: 2022-06-17 | Discharge: 2022-06-17 | Disposition: A | Discharge status: Home / Self Care | Payer: 59 | Source: Ambulatory Visit | Attending: Family | Admitting: Family

## 2022-06-17 DIAGNOSIS — Z1231 Encounter for screening mammogram for malignant neoplasm of breast: Secondary | ICD-10-CM

## 2022-07-17 ENCOUNTER — Ambulatory Visit: Payer: 59

## 2022-07-17 DIAGNOSIS — L84 Corns and callosities: Secondary | ICD-10-CM

## 2022-07-17 DIAGNOSIS — E119 Type 2 diabetes mellitus without complications: Secondary | ICD-10-CM

## 2022-07-17 NOTE — Progress Notes (Signed)
Patient presents today to pick up diabetic shoes that she was exchanging.    Patient was dispensed 1 pair of diabetic shoes patient forgot to bring insoles back with her, but she said the shoe fit was satisfactory. Instructions for break-in and wear was reviewed and a copy was given to the patient.   Re-appointment for regularly scheduled diabetic foot care visits or if they should experience any trouble with the shoes or insoles.

## 2022-09-03 ENCOUNTER — Ambulatory Visit: Payer: 59 | Admitting: Podiatry

## 2022-09-03 DIAGNOSIS — B351 Tinea unguium: Secondary | ICD-10-CM | POA: Diagnosis not present

## 2022-09-03 DIAGNOSIS — M79674 Pain in right toe(s): Secondary | ICD-10-CM | POA: Diagnosis not present

## 2022-09-03 DIAGNOSIS — E119 Type 2 diabetes mellitus without complications: Secondary | ICD-10-CM

## 2022-09-03 DIAGNOSIS — M79675 Pain in left toe(s): Secondary | ICD-10-CM

## 2022-09-08 ENCOUNTER — Encounter: Payer: Self-pay | Admitting: Podiatry

## 2022-09-08 NOTE — Progress Notes (Signed)
  Subjective:  [name], female    DOB: [birth year],  MRN: [id number]  Tiffany Strickland presents to clinic today for preventative diabetic foot care and painful elongated mycotic toenails 1-5 bilaterally which are tender when wearing enclosed shoe gear. Pain is relieved with periodic professional debridement.  Chief Complaint  Patient presents with   Diabetes    DFC BS - DIDN'T TAKE IT THIS MORNING A1C - 6.8 LVPCP - 08/2022   New problem(s): None.   PCP is Vladimir Crofts, FNP.  Allergies  Allergen Reactions   Pollen Extract Other (See Comments)    Seasonal allergies   Tomato Itching   Wool Alcohol [Lanolin] Itching    Reaction unknown    Review of Systems: Negative except as noted in the HPI.  Objective: No changes noted in today's physical examination. There were no vitals filed for this visit. Tiffany Strickland is a pleasant 67 y.o. female in NAD. AAO x 3.  Vascular Examination: CFT <3 seconds b/l LE. Palpable DP pulse(s) b/l LE. Faintly palpable PT pulse(s) b/l LE. Pedal hair absent. No pain with calf compression b/l. Lower extremity skin temperature gradient within normal limits. Nonpitting edema noted BLE. Varicosities present b/l. No cyanosis or clubbing noted b/l LE.  Dermatological Examination: Pedal integument with normal turgor, texture and tone b/l LE. No open wounds b/l. No interdigital macerations b/l. Toenails 1-5 b/l elongated, thickened, discolored with subungual debris. +Tenderness with dorsal palpation of nailplates. No hyperkeratotic or porokeratotic lesions present.  Neurological Examination: Pt has subjective symptoms of neuropathy. Protective sensation intact 5/5 intact bilaterally with 10g monofilament b/l. Proprioception intact bilaterally.  Musculoskeletal Examination: Normal muscle strength 5/5 to all lower extremity muscle groups bilaterally. HAV with bunion deformity noted b/l LE. Hammertoe(s) noted to the R 5th toe.Marland Kitchen No pain, crepitus  or joint limitation noted with ROM b/l LE.  Patient ambulates independently without assistive aids.  Assessment/Plan: 1. Pain due to onychomycosis of toenails of both feet   2. Controlled type 2 diabetes mellitus without complication, without long-term current use of insulin (HCC)    -Patient was evaluated and treated. All patient's and/or POA's questions/concerns answered on today's visit. -Continue foot and shoe inspections daily. Monitor blood glucose per PCP/Endocrinologist's recommendations. -Continue supportive shoe gear daily. -Toenails 1-5 b/l were debrided in length and girth with sterile nail nippers and dremel without iatrogenic bleeding.  -Patient/POA to call should there be question/concern in the interim.   Return in about 3 months (around 12/04/2022).  Freddie Breech, DPM

## 2022-12-04 ENCOUNTER — Ambulatory Visit: Payer: 59 | Admitting: Podiatry

## 2022-12-10 ENCOUNTER — Ambulatory Visit (INDEPENDENT_AMBULATORY_CARE_PROVIDER_SITE_OTHER): Payer: 59 | Admitting: Podiatry

## 2022-12-10 ENCOUNTER — Encounter: Payer: Self-pay | Admitting: Podiatry

## 2022-12-10 VITALS — Ht 65.0 in | Wt 275.4 lb

## 2022-12-10 DIAGNOSIS — M79675 Pain in left toe(s): Secondary | ICD-10-CM

## 2022-12-10 DIAGNOSIS — L84 Corns and callosities: Secondary | ICD-10-CM | POA: Diagnosis not present

## 2022-12-10 DIAGNOSIS — M79674 Pain in right toe(s): Secondary | ICD-10-CM

## 2022-12-10 DIAGNOSIS — B351 Tinea unguium: Secondary | ICD-10-CM

## 2022-12-10 DIAGNOSIS — E119 Type 2 diabetes mellitus without complications: Secondary | ICD-10-CM | POA: Diagnosis not present

## 2022-12-15 ENCOUNTER — Encounter: Payer: Self-pay | Admitting: Podiatry

## 2022-12-15 NOTE — Progress Notes (Signed)
  Subjective:  [name], female    DOB: [birth year],  MRN: [id number]  67 y.o. female presents preventative diabetic foot care and callus(es) of both feet and painful thick toenails that are difficult to trim. Painful toenails interfere with ambulation. Aggravating factors include wearing enclosed shoe gear. Pain is relieved with periodic professional debridement. Painful calluses are aggravated when weightbearing with and without shoegear. Pain is relieved with periodic professional debridement.  Chief Complaint  Patient presents with   Nail Problem    DFC, Pt is a diabetic, last A1C was 7.3, last office visit was a few months ago, PCP is Dr Melina Fiddler.   New problem(s): None   PCP is Daye, Margorie John, FNP.  Allergies  Allergen Reactions   Pollen Extract Other (See Comments)    Seasonal allergies   Tomato Itching   Wool Alcohol [Lanolin] Itching    Reaction unknown    Review of Systems: Negative except as noted in the HPI.   Objective:  Tiffany Strickland is a pleasant 67 y.o. female in NAD. AAO x 3.  Vascular Examination: CFT <3 seconds b/l LE. Palpable DP pulse(s) b/l LE. Faintly palpable pedal pulses b/l. Pedal hair absent. No pain with calf compression b/l. Nonpitting edema noted BLE. Varicosities present b/l. No cyanosis or clubbing noted b/l LE.  Neurological Examination: Sensation grossly intact b/l with 10 gram monofilament. Vibratory sensation intact b/l. Pt has subjective symptoms of neuropathy.   Dermatological Examination: Pedal skin with normal turgor, texture and tone b/l. No open wounds nor interdigital macerations noted. Toenails 1-5 b/l thick, discolored, elongated with subungual debris and pain on dorsal palpation.   Hyperkeratotic lesion(s) bilateral great toes.  No erythema, no edema, no drainage, no fluctuance.  Musculoskeletal Examination: Muscle strength 5/5 to b/l LE.  No pain, crepitus noted b/l.  Hammertoe right 5th digit. HAV with bunion  deformity noted b/l LE.Patient ambulates independently without assistive aids.   Radiographs: None  Last A1c:       No data to display         Assessment:   1. Pain due to onychomycosis of toenails of both feet   2. Callus   3. Controlled type 2 diabetes mellitus without complication, without long-term current use of insulin (HCC)    Plan:  -Consent given for treatment as described below: -Examined patient. -Patient to continue soft, supportive shoe gear daily. -Mycotic toenails 1-5 bilaterally were debrided in length and girth with sterile nail nippers and dremel without incident. -Callus(es) bilateral great toes pared utilizing sterile scalpel blade without complication or incident. Total number debrided =2. -Patient/POA to call should there be question/concern in the interim.  Return in about 3 months (around 03/12/2023).  Freddie Breech, DPM

## 2023-02-20 ENCOUNTER — Emergency Department (HOSPITAL_COMMUNITY)
Admission: EM | Admit: 2023-02-20 | Discharge: 2023-02-20 | Disposition: A | Discharge status: Home / Self Care | Payer: 59 | Attending: Emergency Medicine | Admitting: Emergency Medicine

## 2023-02-20 ENCOUNTER — Emergency Department (HOSPITAL_COMMUNITY): Payer: 59

## 2023-02-20 ENCOUNTER — Other Ambulatory Visit: Payer: Self-pay

## 2023-02-20 ENCOUNTER — Encounter (HOSPITAL_COMMUNITY): Payer: Self-pay

## 2023-02-20 ENCOUNTER — Emergency Department (HOSPITAL_BASED_OUTPATIENT_CLINIC_OR_DEPARTMENT_OTHER): Payer: 59

## 2023-02-20 DIAGNOSIS — M79605 Pain in left leg: Secondary | ICD-10-CM | POA: Diagnosis not present

## 2023-02-20 DIAGNOSIS — M7989 Other specified soft tissue disorders: Secondary | ICD-10-CM | POA: Diagnosis not present

## 2023-02-20 DIAGNOSIS — R079 Chest pain, unspecified: Secondary | ICD-10-CM | POA: Insufficient documentation

## 2023-02-20 DIAGNOSIS — I10 Essential (primary) hypertension: Secondary | ICD-10-CM | POA: Diagnosis not present

## 2023-02-20 DIAGNOSIS — Z7984 Long term (current) use of oral hypoglycemic drugs: Secondary | ICD-10-CM | POA: Insufficient documentation

## 2023-02-20 DIAGNOSIS — M7120 Synovial cyst of popliteal space [Baker], unspecified knee: Secondary | ICD-10-CM

## 2023-02-20 DIAGNOSIS — Z8673 Personal history of transient ischemic attack (TIA), and cerebral infarction without residual deficits: Secondary | ICD-10-CM | POA: Diagnosis not present

## 2023-02-20 DIAGNOSIS — E039 Hypothyroidism, unspecified: Secondary | ICD-10-CM | POA: Insufficient documentation

## 2023-02-20 DIAGNOSIS — Z79899 Other long term (current) drug therapy: Secondary | ICD-10-CM | POA: Diagnosis not present

## 2023-02-20 DIAGNOSIS — R600 Localized edema: Secondary | ICD-10-CM | POA: Insufficient documentation

## 2023-02-20 DIAGNOSIS — M79661 Pain in right lower leg: Secondary | ICD-10-CM

## 2023-02-20 DIAGNOSIS — E119 Type 2 diabetes mellitus without complications: Secondary | ICD-10-CM | POA: Insufficient documentation

## 2023-02-20 DIAGNOSIS — M79604 Pain in right leg: Secondary | ICD-10-CM | POA: Insufficient documentation

## 2023-02-20 DIAGNOSIS — Z7982 Long term (current) use of aspirin: Secondary | ICD-10-CM | POA: Diagnosis not present

## 2023-02-20 LAB — CBC WITH DIFFERENTIAL/PLATELET
Abs Immature Granulocytes: 0.02 10*3/uL (ref 0.00–0.07)
Basophils Absolute: 0 10*3/uL (ref 0.0–0.1)
Basophils Relative: 0 %
Eosinophils Absolute: 0.1 10*3/uL (ref 0.0–0.5)
Eosinophils Relative: 2 %
HCT: 39.5 % (ref 36.0–46.0)
Hemoglobin: 12.2 g/dL (ref 12.0–15.0)
Immature Granulocytes: 0 %
Lymphocytes Relative: 24 %
Lymphs Abs: 1.7 10*3/uL (ref 0.7–4.0)
MCH: 29.8 pg (ref 26.0–34.0)
MCHC: 30.9 g/dL (ref 30.0–36.0)
MCV: 96.3 fL (ref 80.0–100.0)
Monocytes Absolute: 0.9 10*3/uL (ref 0.1–1.0)
Monocytes Relative: 12 %
Neutro Abs: 4.5 10*3/uL (ref 1.7–7.7)
Neutrophils Relative %: 62 %
Platelets: 305 10*3/uL (ref 150–400)
RBC: 4.1 MIL/uL (ref 3.87–5.11)
RDW: 14.3 % (ref 11.5–15.5)
WBC: 7.2 10*3/uL (ref 4.0–10.5)
nRBC: 0 % (ref 0.0–0.2)

## 2023-02-20 LAB — BASIC METABOLIC PANEL
Anion gap: 9 (ref 5–15)
BUN: 19 mg/dL (ref 8–23)
CO2: 27 mmol/L (ref 22–32)
Calcium: 9.7 mg/dL (ref 8.9–10.3)
Chloride: 100 mmol/L (ref 98–111)
Creatinine, Ser: 0.8 mg/dL (ref 0.44–1.00)
GFR, Estimated: >60 mL/min (ref >60)
Glucose, Bld: 169 mg/dL — ABNORMAL HIGH (ref 70–99)
Potassium: 4.4 mmol/L (ref 3.5–5.1)
Sodium: 136 mmol/L (ref 135–145)

## 2023-02-20 LAB — BRAIN NATRIURETIC PEPTIDE: B Natriuretic Peptide: 32 pg/mL (ref 0.0–100.0)

## 2023-02-20 LAB — TROPONIN I (HIGH SENSITIVITY): Troponin I (High Sensitivity): 8 ng/L (ref <18)

## 2023-02-20 LAB — D-DIMER, QUANTITATIVE: D-Dimer, Quant: 0.42 ug{FEU}/mL (ref 0.00–0.50)

## 2023-02-20 NOTE — ED Triage Notes (Addendum)
 Patient said both knees, thighs, calves have been hurting since Tuesday after physical therapy. Had a pain shot that helped relieve her pain 1 hour ago.

## 2023-02-20 NOTE — ED Notes (Signed)
 [place] at bedside

## 2023-02-20 NOTE — ED Provider Notes (Signed)
 [place] EMERGENCY DEPARTMENT AT [facility] Provider Note   CSN: [id number] Arrival date & time: 02/20/23  1538     History  Chief Complaint  Patient presents with   Knee Pain    Tiffany Strickland is a 68 y.o. female with past medical history of HTN, CVA, T2DM, hypothyroidism, OSA, GERD, diverticulosis presents to emergency department for bilateral ultrasounds of BLE to rule out DVT by PCP (Danita Day). She has no history of blood clots, recent surgery, recent immobilization, thinners.    She states that she went to PT for ankle OA to build up strength on Monday and then has been walking with a limp on Tuesday.  She also endorses BLE pain below the knee and swelling.  Starting Tuesday she started walking with a cane due to limp.  She denies recent falls, thinners  Additionally, she complains of 6-7/10 chest pain that she describes as a pressure that started yesterday.  She occasionally has shortness of breath with ambulation.  She denies fevers, productive cough, nasal congestion.   Knee Pain Associated symptoms: no fatigue and no fever      Home Medications Prior to Admission medications   Medication Sig Start Date End Date Taking? Authorizing Provider  Accu-Chek FastClix Lancets MISC SMARTSIG:1 Topical Daily 12/28/20   [provider]  amLODipine  (NORVASC ) 5 MG tablet Take 5 mg by mouth daily. 10/26/19   [provider]  aspirin  (ASPIR-81) 81 MG EC tablet Take 1 tablet (81 mg total) by mouth daily. Swallow whole. 11/08/15   Newlin, Enobong, MD  azelastine (OPTIVAR) 0.05 % ophthalmic solution SMARTSIG:1 Drop(s) In Eye(s) Twice Daily PRN 05/10/21   [provider]  Azelastine HCl 137 MCG/SPRAY SOLN Place 1 spray into both nostrils 2 (two) times daily. 11/16/21   [provider]  Blood Glucose Monitoring Suppl (RELION PRIME MONITOR) DEVI Use as directed once daily 08/06/18   Newlin, Enobong, MD  Blood Glucose Monitoring Suppl (TRUE METRIX  METER) DEVI 1 each by Does not apply route daily before breakfast. 07/14/18   Newlin, Enobong, MD  diclofenac Sodium (VOLTAREN) 1 % GEL Apply 4 g topically 4 (four) times daily as needed for pain. 09/08/20   [provider]  furosemide (LASIX) 20 MG tablet Take 20 mg by mouth daily as needed. 10/03/20   [provider]  glipiZIDE  (GLUCOTROL  XL) 10 MG 24 hr tablet Take 10 mg by mouth daily. 08/15/21   [provider]  glucose blood (RELION PRIME TEST) test strip Use as instructed once daily 08/06/18   Newlin, Enobong, MD  hydrochlorothiazide  (HYDRODIURIL ) 25 MG tablet Take 1 tablet (25 mg total) by mouth daily. 07/07/18   Newlin, Enobong, MD  ibuprofen  (ADVIL ) 600 MG tablet Take 600 mg by mouth 3 (three) times daily. 05/24/21   [provider]  isosorbide  mononitrate (IMDUR ) 60 MG 24 hr tablet Take 60 mg by mouth daily.    [provider]  levothyroxine  (EUTHYROX ) 150 MCG tablet Take 1 tablet (150 mcg total) by mouth daily before breakfast. Must have office visit for refills. Patient taking differently: Take 150 mcg by mouth daily before breakfast. Must have office visit for refills.0.5 tablet MWF 1 tab SS TU TH 11/10/18   Newlin, Enobong, MD  Lidocaine  3.5 % PTCH Apply 1 patch topically every 12 (twelve) hours as needed for pain. 09/11/20   [provider]  losartan  (COZAAR ) 100 MG tablet Take 1 tablet (100 mg total) by mouth daily. 07/07/18   Newlin,  Enobong, MD  Multiple Vitamin (MULTIVITAMIN PO) Take 1 tablet by mouth daily.    [provider]  nitroGLYCERIN  (NITROSTAT ) 0.4 MG SL tablet Place 1 tablet (0.4 mg total) under the tongue every 5 (five) minutes as needed for chest pain. 07/07/18   Newlin, Enobong, MD  potassium chloride (KLOR-CON) 10 MEQ tablet Take 10 mEq by mouth daily. 09/07/20   [provider]  rosuvastatin  (CRESTOR ) 10 MG tablet Take 10 mg by mouth at bedtime. 11/03/19   [provider]  vitamin C (ASCORBIC ACID) 500  MG tablet Take 500 mg by mouth daily.    [provider]      Allergies    Pollen extract, Tomato, and Wool alcohol [lanolin]    Review of Systems   Review of Systems  Constitutional:  Negative for chills, fatigue and fever.  Respiratory:  Positive for shortness of breath. Negative for cough, chest tightness and wheezing.   Cardiovascular:  Positive for chest pain and leg swelling. Negative for palpitations.  Gastrointestinal:  Negative for abdominal pain, constipation, diarrhea, nausea and vomiting.  Neurological:  Negative for dizziness, seizures, weakness, light-headedness, numbness and headaches.    Physical Exam Updated Vital Signs BP 113/69   Pulse 83   Temp 97.7 F (36.5 C) (Oral)   Resp 18   Ht 5' 5 (1.651 m)   Wt 124.7 kg   SpO2 94%   BMI 45.76 kg/m  Physical Exam Vitals and nursing note reviewed.  Constitutional:      General: She is not in acute distress.    Appearance: Normal appearance. She is not ill-appearing.  HENT:     Head: Normocephalic and atraumatic.  Eyes:     Conjunctiva/sclera: Conjunctivae normal.  Cardiovascular:     Rate and Rhythm: Normal rate.     Pulses:          Radial pulses are 2+ on the right side and 2+ on the left side.       Dorsalis pedis pulses are 2+ on the right side and 2+ on the left side.       Posterior tibial pulses are 2+ on the right side and 2+ on the left side.  Pulmonary:     Effort: Pulmonary effort is normal. No respiratory distress.     Breath sounds: Normal breath sounds.  Chest:     Chest wall: No tenderness.  Abdominal:     General: Bowel sounds are normal. There is no distension.     Palpations: Abdomen is soft.     Tenderness: There is no abdominal tenderness.  Musculoskeletal:     Cervical back: Normal range of motion and neck supple. No rigidity or tenderness.     Right lower leg: Edema (Nonpitting) present.     Left lower leg: Edema (Nonpitting) present.     Comments: BLE tenderness to light  palpation of knee, calf  Skin:    General: Skin is warm.     Capillary Refill: Capillary refill takes less than 2 seconds.     Coloration: Skin is not jaundiced or pale.  Neurological:     Mental Status: She is alert and oriented to person, place, and time. Mental status is at baseline.     ED Results / Procedures / Treatments   Labs (all labs ordered are listed, but only abnormal results are displayed) Labs Reviewed  BASIC METABOLIC PANEL - Abnormal; Notable for the following components:      Result Value   Glucose, Bld  169 (*)    All other components within normal limits  BRAIN NATRIURETIC PEPTIDE  D-DIMER, QUANTITATIVE  CBC WITH DIFFERENTIAL/PLATELET  TROPONIN I (HIGH SENSITIVITY)  TROPONIN I (HIGH SENSITIVITY)    EKG EKG Interpretation Date/Time:  Thursday February 20 2023 18:10:17 EST Ventricular Rate:  74 PR Interval:  176 QRS Duration:  144 QT Interval:  436 QTC Calculation: 484 R Axis:   106  Text Interpretation: Sinus rhythm Left bundle branch block No significant change since last tracing Confirmed by Patt Alm DEL [phone]) on 02/20/2023 6:30:58 PM  Radiology VAS US  LOWER EXTREMITY VENOUS (DVT) (ONLY [facility] & [facility]) Result Date: 02/20/2023  Lower Venous DVT Study Patient Name:  Tiffany Strickland  Date of Exam:   02/20/2023 Medical Rec #: [id number]        Accession #:    [id number] Date of Birth: 1955/10/16         Patient Gender: F Patient Age:   73 years Exam Location:  [facility] Procedure:      VAS US  LOWER EXTREMITY VENOUS (DVT) Referring Phys: DAVID YAO --------------------------------------------------------------------------------  Indications: Swelling, and Edema.  Comparison Study: Previous study of right lower extremity on 10.3.2019. Performing Technologist: Edilia Elden Appl  Examination Guidelines: A complete evaluation includes B-mode imaging, spectral Doppler, color Doppler, and power Doppler as needed of all accessible portions of each vessel. Bilateral  testing is considered an integral part of a complete examination. Limited examinations for reoccurring indications may be performed as noted. The reflux portion of the exam is performed with the patient in reverse Trendelenburg.  +---------+---------------+---------+-----------+----------+-------------------+ RIGHT    CompressibilityPhasicitySpontaneityPropertiesThrombus Aging      +---------+---------------+---------+-----------+----------+-------------------+ CFV      Full           Yes      Yes                                      +---------+---------------+---------+-----------+----------+-------------------+ SFJ      Full           Yes      Yes                                      +---------+---------------+---------+-----------+----------+-------------------+ FV Prox  Full                                                             +---------+---------------+---------+-----------+----------+-------------------+ FV Mid   Full                                         Limited.            +---------+---------------+---------+-----------+----------+-------------------+ FV DistalFull                                         Limited.            +---------+---------------+---------+-----------+----------+-------------------+ PFV      Full                                                             +---------+---------------+---------+-----------+----------+-------------------+  POP      Full           Yes      Yes                                      +---------+---------------+---------+-----------+----------+-------------------+ PTV      Full                                                             +---------+---------------+---------+-----------+----------+-------------------+ PERO                                                  Not well visualized +---------+---------------+---------+-----------+----------+-------------------+    +---------+---------------+---------+-----------+----------+----------------+ LEFT     CompressibilityPhasicitySpontaneityPropertiesThrombus Aging   +---------+---------------+---------+-----------+----------+----------------+ CFV      Full                                                          +---------+---------------+---------+-----------+----------+----------------+ SFJ      Full                                                          +---------+---------------+---------+-----------+----------+----------------+ FV Prox  Full                                                          +---------+---------------+---------+-----------+----------+----------------+ FV Mid   Full           Yes      Yes                  Limited.         +---------+---------------+---------+-----------+----------+----------------+ FV DistalFull           Yes      Yes                  Limited.         +---------+---------------+---------+-----------+----------+----------------+ PFV      Full                                                          +---------+---------------+---------+-----------+----------+----------------+ POP      Full           Yes      Yes                                   +---------+---------------+---------+-----------+----------+----------------+  PTV      Full                                                          +---------+---------------+---------+-----------+----------+----------------+ PERO                                                  Patent by color. +---------+---------------+---------+-----------+----------+----------------+ Cystic structure noted in left popliteal fossa measuring 2.8 x 1.6 x 2.2 cm   Summary: RIGHT: - There is no evidence of deep vein thrombosis in the lower extremity. However, portions of this examination were limited- see technologist comments above.  - No cystic structure found in the popliteal fossa.  LEFT: -  There is no evidence of deep vein thrombosis in the lower extremity. However, portions of this examination were limited- see technologist comments above.  - A cystic structure is found in the popliteal fossa.  *See table(s) above for measurements and observations.    Preliminary    DG Chest 2 View Result Date: 02/20/2023 CLINICAL DATA:  Shortness of breath and chest pain. EXAM: CHEST - 2 VIEW COMPARISON:  Chest radiograph dated 11/20/2021. FINDINGS: Evaluation is limited due to superimposition of the chest wall soft tissues. Right lower lung field density, likely atelectasis. Pneumonia is not excluded. No consolidative changes. There is no pleural effusion or pneumothorax. Stable cardiac silhouette. No acute osseous pathology. IMPRESSION: Shallow inspiration with probable atelectasis in the right lung base. Pneumonia is not excluded. Electronically Signed   By: Vanetta Chou M.D.   On: 02/20/2023 17:08    Procedures Procedures    Medications Ordered in ED Medications - No data to display  ED Course/ Medical Decision Making/ A&P                                 Medical Decision Making Amount and/or Complexity of Data Reviewed Labs: ordered. Radiology: ordered.   Patient presents to the ED for concern of DVT, CP, pedal edema, this involves an extensive number of treatment options, and is a complaint that carries with it a high risk of complications and morbidity.  The differential diagnosis includes DVT, lymphedema, HF, ACS, PE, PNA. Not exhaustive list   Co morbidities that complicate the patient evaluation  HTN, CVA, T2DM, hypothyroidism, OSA, GERD, diverticulosis   Additional history obtained:  Additional history obtained from Nursing and Outside Medical Records   External records from outside source obtained and reviewed including  Triage RN note Recent medical evaluation and med list   Lab Tests:  I Ordered, and personally interpreted labs.  The pertinent results  include:   Troponin negative Dimer neg BNP WNL CBG 169   Imaging Studies ordered:  I ordered imaging studies including CXR  I independently visualized and interpreted imaging which showed right lower lung field density I agree with the radiologist interpretation   Cardiac Monitoring:  The patient was maintained on a cardiac monitor.  I personally viewed and interpreted the cardiac monitored which showed an underlying rhythm of: NSR with LBBB with no change from prior     Problem List / ED Course:  Chest pain Trop neg EKG unchanged Shortness of breath Dimer neg CXR significant for right lower lobe density but without leukocytosis nor abnormal labs with no fever at home.  I do not feel that we should treat for pneumonia at this time. Provided return to emergency department precautions for this. Pedal edema and tenderness Sent by PCP for bilateral ultrasounds as patient has had increased pedal edema and tenderness.  There is edema without pitting. BNP neg. Dimer neg Ultrasound neg for DVT but shows one baker cyst - will provide ortho f/u in case needing drainage   Reevaluation:  After the interventions noted above, I reevaluated the patient and found that they have :stayed the same   Social Determinants of Health:  Has PCP follow-up   Dispostion:  After consideration of the diagnostic results and the patients response to treatment, I feel that the patent would benefit from outpatient management.   Discussed pt with Dr. Patt who reviewed her ED workup and agrees with plan Final Clinical Impression(s) / ED Diagnoses Final diagnoses:  Pain in both lower legs  Chest pain, unspecified type    Rx / DC Orders ED Discharge Orders     None         Minnie Tinnie BRAVO, PA 02/20/23 2223    Dean Clarity, MD 02/21/23 [phone]

## 2023-02-20 NOTE — Discharge Instructions (Addendum)
 Thank you for letting us  evaluate you today. You US  was negative for DVT but you do have a baker's cyst behind your knee. I will provide you with ortho follow up for further management or if it needs draining.  You do not have blood clot anywhere in your body.  Your troponin is negative which would indicate heart injury.  You do not have an elevated white blood cell count.  Your vital signs are within normal limits and you do not have a fever.  I do not think that we need to treat for possible pneumonia at this time as there is no significant suspicion for it.  Return to emergency department if you experience significant worsening of chest pain, shortness of breath especially in setting of fever.

## 2023-03-03 ENCOUNTER — Other Ambulatory Visit (INDEPENDENT_AMBULATORY_CARE_PROVIDER_SITE_OTHER): Payer: 59

## 2023-03-03 ENCOUNTER — Encounter: Payer: Self-pay | Admitting: Surgical

## 2023-03-03 ENCOUNTER — Ambulatory Visit (INDEPENDENT_AMBULATORY_CARE_PROVIDER_SITE_OTHER): Payer: 59 | Admitting: Surgical

## 2023-03-03 DIAGNOSIS — M25561 Pain in right knee: Secondary | ICD-10-CM

## 2023-03-03 DIAGNOSIS — M25562 Pain in left knee: Secondary | ICD-10-CM | POA: Diagnosis not present

## 2023-03-03 NOTE — Progress Notes (Signed)
Office Visit Note   Patient: Tiffany Strickland           Date of Birth: 1955/12/24           MRN: [id number] Visit Date: 03/03/2023 Requested by: Vladimir Crofts, FNP [address] [place],  [place] [address] PCP: Vladimir Crofts, FNP  Subjective: Chief Complaint  Patient presents with   Right Leg - Pain   Left Leg - Pain    HPI: Zyniya Laconte is a 68 y.o. female who presents to the office reporting bilateral knee pain.  Patient states that she has been doing physical therapy for her ankle as prescribed by her PCP.  She noticed after starting physical therapy that Monday that she began to have increased knee pain on Tuesday to the point where she had difficulty walking and had to use a wheelchair.  She was sent to the emergency department by her PCP where she had extensive workup with ultrasounds of her bilateral lower extremities demonstrating no DVT but did note a Baker's cyst in the left knee.  States that her left knee has been bothering her more than her right knee since Tuesday.  Prior to onset of pain, has not really had any consistent symptoms in her knee aside from 1 prior episode that has been similar to this that was resolved with cortisone injection several years back.  She denies any fevers or chills.  No prior history of surgery on the left knee but has had arthroscopic surgery on her right knee that was done in the 90s.  She has progressed from wheelchair to using a walker which she is mostly just using for balance at this point.  She has about 50% improvement in symptoms since onset.  Localizes pain to the anterior medial aspect of the knee without any significant radiation.  No groin pain.  No numbness or tingling.  She does have history of diabetes but this is well-controlled with diet and glipizide.  Takes Tylenol.  Does not take blood thinners.  Only other major medical history is GERD and a stroke which she had in 2005..                ROS: All systems reviewed are  negative as they relate to the chief complaint within the history of present illness.  Patient denies fevers or chills.  Assessment & Plan: Visit Diagnoses:  1. Acute pain of both knees     Plan: Patient is a 68 year old female who presents for evaluation of bilateral knee pain, left greater than right.  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  We discussed options available to patient.  Low suspicion for septic arthritis especially with 50% improvement over the last week.  Main differential would be flareup of her arthritis from the physical therapy versus gout attack though she has never had gout in the past.  We discussed options such as injection versus anti-inflammatory medication versus waiting this out since that she has fairly significant symptomatic improvement.  She would like to just see how this will trend and recommended she make an appointment for 2 weeks from now so that if there is a plateau of her improvement, we could consider injection at that time.  If she has near 100% symptomatic relief, she may call and cancel her appointment.  Follow-Up Instructions: No follow-ups on file.  Orders:  Orders Placed This Encounter  Procedures   XR Knee 1-2 Views Left   XR Knee 1-2 Views Right   No orders of the defined types were placed in this encounter.     Procedures: No procedures performed   Clinical Data: No additional findings.  Objective: Vital Signs: There were no vitals taken for this visit.  Physical Exam:  Constitutional: Patient appears well-developed HEENT:  Head: Normocephalic Eyes:EOM are normal Neck: Normal range of motion Cardiovascular: Normal rate Pulmonary/chest: Effort normal Neurologic: Patient is alert Skin: Skin is warm Psychiatric: Patient has normal mood and affect  Ortho Exam: Ortho exam demonstrates  left knee with 5 degrees extension and 95 degrees of knee flexion without significant discomfort until terminal flexion.  She has right knee with 0 degrees extension and 115 degrees of knee flexion.  No calf tenderness.  Negative Homans' sign.  Able to perform straight leg raise with bilateral lower extremities.  No pain with hip range of motion bilaterally.  She has tenderness over the medial joint line of both knees moderate to severely with minimal lateral joint line tenderness.  Stable to anterior and posterior drawer sign.  Stable to varus valgus stress at 0 and 30 degrees bilaterally.  No cellulitis or skin changes noted.  Specialty Comments:  No specialty comments available.  Imaging: No results found.   PMFS History: Patient Active Problem List   Diagnosis Date Noted   Posterior neck pain 05/29/2021   Chronic idiopathic constipation 06/21/2020   Diverticular disease of colon 06/21/2020   Change in bowel habit 03/15/2020   Epigastric pain 03/15/2020   Gastroesophageal reflux disease 03/15/2020   Other specified bacterial intestinal infections 03/15/2020   Pulmonary HTN (HCC) 06/29/2018   Educated about COVID-19 virus infection 06/29/2018   OSA (obstructive sleep apnea) 02/03/2017   Hypothyroidism 02/02/2017   Chest pain 02/02/2017   Morbid obesity (HCC) 01/27/2017   Plantar fasciitis of left foot 11/08/2015   Non-insulin treated type 2 diabetes mellitus (HCC) 06/08/2015   Colon cancer screening 06/08/2015   Bilateral leg edema 12/15/2014   Blurry vision, bilateral 06/16/2014   Breast cancer screening 06/16/2014   Other specified hypothyroidism 06/16/2014   Other seasonal allergic rhinitis 06/16/2014   History of stroke 01/31/2014   Rectal nodule 09/06/2013   Essential hypertension 08/24/2013   Nonspecific (abnormal) findings on radiological and other examination of gastrointestinal tract 08/12/2013   Lateral epicondylitis of right elbow 07/21/2013   Right shoulder pain  07/21/2013   Sprain of wrist, right 07/21/2013   Constipation 04/15/2013   Rectal bleeding 04/15/2013   Past Medical History:  Diagnosis Date   Colon polyp    adenomatous   Diabetes mellitus without complication (HCC)    Hyperlipidemia    Hypertension    Sleep apnea    Stroke (HCC)    Thyroid disease     Family History  Problem Relation Age of Onset   Hypertension Mother    Heart disease Maternal Grandmother    Cancer Maternal Grandmother        ovarian   Cancer Maternal Grandfather    Hypertension Other    Hyperlipidemia Other    Cancer Other    Sleep apnea Other    Obesity Other     Past Surgical History:  Procedure Laterality Date   ABDOMINAL HYSTERECTOMY     BLADDER SUSPENSION     COLONOSCOPY WITH PROPOFOL N/A 05/05/2020   Procedure: COLONOSCOPY WITH PROPOFOL;  Surgeon: Jeani Hawking, MD;  Location: [facility];  Service: Endoscopy;  Laterality: N/A;   EUS N/A 08/12/2013   Procedure: LOWER ENDOSCOPIC ULTRASOUND (EUS);  Surgeon: Rachael Fee, MD;  Location: [facility];  Service: Endoscopy;  Laterality: N/A;   KNEE SURGERY     POLYPECTOMY  05/05/2020   Procedure: POLYPECTOMY;  Surgeon: Jeani Hawking, MD;  Location: [facility];  Service: Endoscopy;;   TUBAL LIGATION     Social History   Occupational History   Not on file  Tobacco Use   Smoking status: Never   Smokeless tobacco: Never  Vaping Use   Vaping status: Never Used  Substance and Sexual Activity   Alcohol use: No   Drug use: No   Sexual activity: Not on file

## 2023-03-19 ENCOUNTER — Ambulatory Visit: Payer: 59 | Admitting: Surgical

## 2023-03-26 ENCOUNTER — Ambulatory Visit (INDEPENDENT_AMBULATORY_CARE_PROVIDER_SITE_OTHER): Payer: 59 | Admitting: Podiatry

## 2023-03-26 DIAGNOSIS — Z91199 Patient's noncompliance with other medical treatment and regimen due to unspecified reason: Secondary | ICD-10-CM

## 2023-03-26 NOTE — Progress Notes (Signed)
1. No-show for appointment

## 2023-04-22 ENCOUNTER — Ambulatory Visit (INDEPENDENT_AMBULATORY_CARE_PROVIDER_SITE_OTHER): Admitting: Podiatry

## 2023-04-22 ENCOUNTER — Encounter: Payer: Self-pay | Admitting: Podiatry

## 2023-04-22 VITALS — Ht 65.0 in | Wt 275.0 lb

## 2023-04-22 DIAGNOSIS — Q828 Other specified congenital malformations of skin: Secondary | ICD-10-CM | POA: Diagnosis not present

## 2023-04-22 DIAGNOSIS — E1142 Type 2 diabetes mellitus with diabetic polyneuropathy: Secondary | ICD-10-CM | POA: Diagnosis not present

## 2023-04-22 DIAGNOSIS — M79674 Pain in right toe(s): Secondary | ICD-10-CM | POA: Diagnosis not present

## 2023-04-22 DIAGNOSIS — M79675 Pain in left toe(s): Secondary | ICD-10-CM | POA: Diagnosis not present

## 2023-04-22 DIAGNOSIS — B351 Tinea unguium: Secondary | ICD-10-CM | POA: Diagnosis not present

## 2023-04-24 ENCOUNTER — Other Ambulatory Visit: Payer: Self-pay | Admitting: Family

## 2023-04-24 DIAGNOSIS — N644 Mastodynia: Secondary | ICD-10-CM

## 2023-04-27 NOTE — Progress Notes (Signed)
  Subjective:  [name], female    DOB: 1955/06/24,  MRN: [id number]  Bryton Waight presents to clinic today for preventative diabetic foot care and painful porokeratotic lesion(s) right foot and painful mycotic toenails that limit ambulation. Painful toenails interfere with ambulation. Aggravating factors include wearing enclosed shoe gear. Pain is relieved with periodic professional debridement. Painful porokeratotic lesions are aggravated when weightbearing with and without shoegear. Pain is relieved with periodic professional debridement.  Chief Complaint  Patient presents with   [facility]    She is here for a a diabetic nail trim, PCP is Dr day and seen 3 months ago and she does not know her last A1C.    New problem(s): None.   PCP is Vladimir Crofts, FNP.  Allergies  Allergen Reactions   Pollen Extract Other (See Comments)    Seasonal allergies   Tomato Itching   Wool Alcohol [Lanolin] Itching    Reaction unknown    Review of Systems: Negative except as noted in the HPI.  Objective: No changes noted in today's physical examination. There were no vitals filed for this visit. Tiffany Strickland is a pleasant 68 y.o. female morbidly obese in NAD. AAO x 3.  Vascular Examination: CFT <3 seconds b/l LE. Palpable DP pulse(s) b/l LE. Faintly palpable pedal pulses b/l. Pedal hair absent. No pain with calf compression b/l. Nonpitting edema noted BLE. Varicosities present b/l. No cyanosis or clubbing noted b/l LE.  Neurological Examination: Sensation grossly intact b/l with 10 gram monofilament. Vibratory sensation intact b/l. Pt has subjective symptoms of neuropathy.   Dermatological Examination: Pedal skin with normal turgor, texture and tone b/l. No open wounds nor interdigital macerations noted. Toenails 1-5 b/l thick, discolored, elongated with subungual debris and pain on dorsal palpation.   Porokeratotic lesion(s) distal tip of right 3rd toe. No erythema, no edema, no  drainage, no fluctuance.  Musculoskeletal Examination: Muscle strength 5/5 to b/l LE.  No pain, crepitus noted b/l. Hammertoe right 5th digit. HAV with bunion deformity noted b/l LE.Patient ambulates independently without assistive aids.   Radiographs: None  Assessment/Plan: 1. Pain due to onychomycosis of toenails of both feet   2. Porokeratosis   3. Diabetic peripheral neuropathy associated with type 2 diabetes mellitus (HCC)   Patient was evaluated and treated. All patient's and/or POA's questions/concerns addressed on today's visit. Toenails 1-5 debrided in length and girth without incident. Porokeratotic lesion(s) R 3rd toe pared with sharp debridement without incident. Continue daily foot inspections and monitor blood glucose per PCP/Endocrinologist's recommendations. Continue soft, supportive shoe gear daily. Report any pedal injuries to medical professional. Call office if there are any questions/concerns. -Patient/POA to call should there be question/concern in the interim.   Return in about 3 months (around 07/23/2023).  Freddie Breech, DPM      [facility] LOCATION: [address] [place] [address]                   Office [phone]   [facility] LOCATION: [address] [place], [place] [address] Office [phone]

## 2023-05-13 ENCOUNTER — Ambulatory Visit
Admission: RE | Admit: 2023-05-13 | Discharge: 2023-05-13 | Disposition: A | Discharge status: Home / Self Care | Source: Ambulatory Visit | Attending: Family | Admitting: Family

## 2023-05-13 ENCOUNTER — Ambulatory Visit

## 2023-05-13 ENCOUNTER — Ambulatory Visit: Admission: RE | Admit: 2023-05-13 | Source: Ambulatory Visit

## 2023-05-13 DIAGNOSIS — N644 Mastodynia: Secondary | ICD-10-CM

## 2023-06-09 ENCOUNTER — Other Ambulatory Visit

## 2023-06-09 ENCOUNTER — Encounter

## 2023-08-12 ENCOUNTER — Ambulatory Visit: Admitting: Podiatry

## 2023-08-16 ENCOUNTER — Emergency Department (HOSPITAL_COMMUNITY)

## 2023-08-16 ENCOUNTER — Other Ambulatory Visit: Payer: Self-pay

## 2023-08-16 ENCOUNTER — Encounter (HOSPITAL_COMMUNITY): Payer: Self-pay

## 2023-08-16 ENCOUNTER — Observation Stay (HOSPITAL_COMMUNITY)
Admission: EM | Admit: 2023-08-16 | Discharge: 2023-08-18 | Disposition: A | Discharge status: Home / Self Care | Attending: Internal Medicine | Admitting: Internal Medicine

## 2023-08-16 DIAGNOSIS — N179 Acute kidney failure, unspecified: Secondary | ICD-10-CM | POA: Diagnosis present

## 2023-08-16 DIAGNOSIS — K625 Hemorrhage of anus and rectum: Secondary | ICD-10-CM | POA: Diagnosis not present

## 2023-08-16 DIAGNOSIS — K5900 Constipation, unspecified: Secondary | ICD-10-CM | POA: Diagnosis present

## 2023-08-16 DIAGNOSIS — Z6841 Body Mass Index (BMI) 40.0 and over, adult: Secondary | ICD-10-CM | POA: Diagnosis not present

## 2023-08-16 DIAGNOSIS — I2699 Other pulmonary embolism without acute cor pulmonale: Principal | ICD-10-CM

## 2023-08-16 DIAGNOSIS — I10 Essential (primary) hypertension: Secondary | ICD-10-CM | POA: Diagnosis present

## 2023-08-16 DIAGNOSIS — E66813 Obesity, class 3: Secondary | ICD-10-CM | POA: Diagnosis present

## 2023-08-16 DIAGNOSIS — R030 Elevated blood-pressure reading, without diagnosis of hypertension: Secondary | ICD-10-CM | POA: Diagnosis present

## 2023-08-16 DIAGNOSIS — I272 Pulmonary hypertension, unspecified: Secondary | ICD-10-CM | POA: Diagnosis present

## 2023-08-16 DIAGNOSIS — I2609 Other pulmonary embolism with acute cor pulmonale: Principal | ICD-10-CM | POA: Insufficient documentation

## 2023-08-16 DIAGNOSIS — M549 Dorsalgia, unspecified: Secondary | ICD-10-CM | POA: Diagnosis present

## 2023-08-16 DIAGNOSIS — Z8673 Personal history of transient ischemic attack (TIA), and cerebral infarction without residual deficits: Secondary | ICD-10-CM | POA: Diagnosis not present

## 2023-08-16 DIAGNOSIS — E119 Type 2 diabetes mellitus without complications: Secondary | ICD-10-CM | POA: Diagnosis not present

## 2023-08-16 DIAGNOSIS — Z7982 Long term (current) use of aspirin: Secondary | ICD-10-CM | POA: Diagnosis not present

## 2023-08-16 LAB — CBC
HCT: 37.2 % (ref 36.0–46.0)
Hemoglobin: 11.4 g/dL — ABNORMAL LOW (ref 12.0–15.0)
MCH: 29.2 pg (ref 26.0–34.0)
MCHC: 30.6 g/dL (ref 30.0–36.0)
MCV: 95.4 fL (ref 80.0–100.0)
Platelets: 261 K/uL (ref 150–400)
RBC: 3.9 MIL/uL (ref 3.87–5.11)
RDW: 14 % (ref 11.5–15.5)
WBC: 6.7 K/uL (ref 4.0–10.5)
nRBC: 0 % (ref 0.0–0.2)

## 2023-08-16 LAB — BASIC METABOLIC PANEL WITH GFR
Anion gap: 8 (ref 5–15)
BUN: 28 mg/dL — ABNORMAL HIGH (ref 8–23)
CO2: 26 mmol/L (ref 22–32)
Calcium: 9.6 mg/dL (ref 8.9–10.3)
Chloride: 101 mmol/L (ref 98–111)
Creatinine, Ser: 1.35 mg/dL — ABNORMAL HIGH (ref 0.44–1.00)
GFR, Estimated: 43 mL/min — ABNORMAL LOW (ref >60)
Glucose, Bld: 122 mg/dL — ABNORMAL HIGH (ref 70–99)
Potassium: 4 mmol/L (ref 3.5–5.1)
Sodium: 135 mmol/L (ref 135–145)

## 2023-08-16 LAB — TROPONIN I (HIGH SENSITIVITY)
Troponin I (High Sensitivity): 16 ng/L (ref <18)
Troponin I (High Sensitivity): 16 ng/L (ref <18)

## 2023-08-16 LAB — PROTIME-INR
INR: 1 (ref 0.8–1.2)
Prothrombin Time: 13.2 s (ref 11.4–15.2)

## 2023-08-16 LAB — APTT: aPTT: 30 s (ref 24–36)

## 2023-08-16 MED ORDER — HEPARIN (PORCINE) 25000 UT/250ML-% IV SOLN
1350.0000 [IU]/h | INTRAVENOUS | Status: DC
  Administered 2023-08-16: 1500 [IU]/h via INTRAVENOUS
  Filled 2023-08-16: qty 250

## 2023-08-16 MED ORDER — LOSARTAN POTASSIUM 50 MG PO TABS
100.0000 mg | ORAL_TABLET | Freq: Every day | ORAL | Status: DC
  Administered 2023-08-17: 100 mg via ORAL
  Filled 2023-08-16 (×2): qty 2

## 2023-08-16 MED ORDER — LACTATED RINGERS IV BOLUS
1000.0000 mL | Freq: Once | INTRAVENOUS | Status: AC
  Administered 2023-08-16: 1000 mL via INTRAVENOUS

## 2023-08-16 MED ORDER — HYDROCHLOROTHIAZIDE 25 MG PO TABS
25.0000 mg | ORAL_TABLET | Freq: Every day | ORAL | Status: DC
  Administered 2023-08-17 – 2023-08-18 (×2): 25 mg via ORAL
  Filled 2023-08-16 (×2): qty 1

## 2023-08-16 MED ORDER — IOHEXOL 350 MG/ML SOLN
100.0000 mL | Freq: Once | INTRAVENOUS | Status: AC | PRN
  Administered 2023-08-16: 100 mL via INTRAVENOUS

## 2023-08-16 MED ORDER — ROSUVASTATIN CALCIUM 10 MG PO TABS
10.0000 mg | ORAL_TABLET | Freq: Every day | ORAL | Status: DC
  Administered 2023-08-17 (×2): 10 mg via ORAL
  Filled 2023-08-16 (×2): qty 1

## 2023-08-16 MED ORDER — GLIPIZIDE ER 10 MG PO TB24
10.0000 mg | ORAL_TABLET | Freq: Every day | ORAL | Status: DC
  Administered 2023-08-17 – 2023-08-18 (×2): 10 mg via ORAL
  Filled 2023-08-16 (×2): qty 1

## 2023-08-16 MED ORDER — AMLODIPINE BESYLATE 5 MG PO TABS
5.0000 mg | ORAL_TABLET | Freq: Every day | ORAL | Status: DC
  Administered 2023-08-17 – 2023-08-18 (×2): 5 mg via ORAL
  Filled 2023-08-16 (×2): qty 1

## 2023-08-16 MED ORDER — LEVOTHYROXINE SODIUM 150 MCG PO TABS
150.0000 ug | ORAL_TABLET | Freq: Every day | ORAL | Status: DC
  Administered 2023-08-17 – 2023-08-18 (×2): 150 ug via ORAL
  Filled 2023-08-16 (×2): qty 1

## 2023-08-16 MED ORDER — SPIRONOLACTONE 25 MG PO TABS
25.0000 mg | ORAL_TABLET | Freq: Every day | ORAL | Status: DC
  Administered 2023-08-17 – 2023-08-18 (×2): 25 mg via ORAL
  Filled 2023-08-16 (×2): qty 1

## 2023-08-16 MED ORDER — ISOSORBIDE MONONITRATE ER 60 MG PO TB24
60.0000 mg | ORAL_TABLET | Freq: Every day | ORAL | Status: DC
  Administered 2023-08-17 – 2023-08-18 (×2): 60 mg via ORAL
  Filled 2023-08-16 (×2): qty 1

## 2023-08-16 MED ORDER — HEPARIN BOLUS VIA INFUSION
4000.0000 [IU] | Freq: Once | INTRAVENOUS | Status: AC
  Administered 2023-08-16: 4000 [IU] via INTRAVENOUS
  Filled 2023-08-16: qty 4000

## 2023-08-16 NOTE — Assessment & Plan Note (Addendum)
 In the setting of known hypertension.  Currently with systolic blood pressure 160.  Has apparently missed some of her blood pressure medications today.  Will order her medications and monitor

## 2023-08-16 NOTE — Assessment & Plan Note (Signed)
 Patient with long pulmonary hypertension noted initially on CT scan in 2018 as evidenced by pulmonary artery. -Monitor closely -Conservative care -Consider pulmonary consultation as out patient

## 2023-08-16 NOTE — ED Notes (Signed)
 ED TO INPATIENT HANDOFF REPORT  ED Nurse Name and Phone #:   S Name/Age/Gender Tiffany Strickland 68 y.o. female Room/Bed: WA21/WA21  Code Status   Code Status: Full Code  Home/SNF/Other Home Patient oriented to: self, place, time, and situation Is this baseline? Yes   Triage Complete: Triage complete  Chief Complaint Acute pulmonary embolism (HCC) [I26.99]  Triage Note Pt reports R sided upper back pain radiating into her R breast since yesterday morning. No SHOB. No CP.   Allergies Allergies  Allergen Reactions   Pollen Extract Other (See Comments)    Seasonal allergies   Tomato Itching   Wool Alcohol [Lanolin] Itching    Reaction unknown    Level of Care/Admitting Diagnosis ED Disposition     ED Disposition  Admit   Condition  --   Comment  Hospital Area: [facility] [[id number]]  Level of Care: Telemetry [5]  Admit to tele based on following criteria: Other see comments  Comments: Pulmonary embolism  May admit patient to [facility] or [facility] if equivalent level of care is available:: Yes  Covid Evaluation: Asymptomatic - no recent exposure (last 10 days) testing not required  Diagnosis: Acute pulmonary embolism [facility]) [[id number]]  Admitting Physician: JACKEE ZEBEDEE BROCKS [[id number]]  Attending Physician: JACKEE ZEBEDEE BROCKS [[id number]]  Certification:: I certify this patient will need inpatient services for at least 2 midnights  Expected Medical Readiness: 08/18/2023          B Medical/Surgery History Past Medical History:  Diagnosis Date   Colon polyp    adenomatous   Diabetes mellitus without complication (HCC)    Hyperlipidemia    Hypertension    Sleep apnea    Stroke (HCC)    Thyroid  disease    Past Surgical History:  Procedure Laterality Date   ABDOMINAL HYSTERECTOMY     BLADDER SUSPENSION     COLONOSCOPY WITH PROPOFOL  N/A 05/05/2020   Procedure: COLONOSCOPY WITH PROPOFOL ;  Surgeon: Rollin Dover, MD;  Location: [facility];   Service: Endoscopy;  Laterality: N/A;   EUS N/A 08/12/2013   Procedure: LOWER ENDOSCOPIC ULTRASOUND (EUS);  Surgeon: Toribio SHAUNNA Cedar, MD;  Location: [facility];  Service: Endoscopy;  Laterality: N/A;   KNEE SURGERY     POLYPECTOMY  05/05/2020   Procedure: POLYPECTOMY;  Surgeon: Rollin Dover, MD;  Location: [facility];  Service: Endoscopy;;   TUBAL LIGATION       A IV Location/Drains/Wounds Patient Lines/Drains/Airways Status     Active Line/Drains/Airways     Name Placement date Placement time Site Days   Peripheral IV 08/16/23 20 G Left Antecubital 08/16/23  2103  Antecubital  less than 1            Intake/Output Last 24 hours No intake or output data in the 24 hours ending 08/16/23 2223  Labs/Imaging Results for orders placed or performed during the hospital encounter of 08/16/23 (from the past 48 hours)  Basic metabolic panel     Status: Abnormal   Collection Time: 08/16/23  7:40 PM  Result Value Ref Range   Sodium 135 135 - 145 mmol/L   Potassium 4.0 3.5 - 5.1 mmol/L   Chloride 101 98 - 111 mmol/L   CO2 26 22 - 32 mmol/L   Glucose, Bld 122 (H) 70 - 99 mg/dL    Comment: Glucose reference range applies only to samples taken after fasting for at least 8 hours.   BUN 28 (H) 8 - 23 mg/dL   Creatinine, Ser  1.35 (H) 0.44 - 1.00 mg/dL   Calcium  9.6 8.9 - 10.3 mg/dL   GFR, Estimated 43 (L) >60 mL/min    Comment: (NOTE) Calculated using the CKD-EPI Creatinine Equation (2021)    Anion gap 8 5 - 15    Comment: Performed at [facility], [address], [place], [place] [address]  CBC     Status: Abnormal   Collection Time: 08/16/23  7:40 PM  Result Value Ref Range   WBC 6.7 4.0 - 10.5 K/uL   RBC 3.90 3.87 - 5.11 MIL/uL   Hemoglobin 11.4 (L) 12.0 - 15.0 g/dL   HCT 62.7 63.9 - 53.9 %   MCV 95.4 80.0 - 100.0 fL   MCH 29.2 26.0 - 34.0 pg   MCHC 30.6 30.0 - 36.0 g/dL   RDW 85.9 88.4 - 84.4 %   Platelets 261 150 - 400 K/uL   nRBC 0.0 0.0 - 0.2 %     Comment: Performed at [facility], [address] [place], [place] [address]  Troponin I (High Sensitivity)     Status: None   Collection Time: 08/16/23  7:40 PM  Result Value Ref Range   Troponin I (High Sensitivity) 16 <18 ng/L    Comment: (NOTE) Elevated high sensitivity troponin I (hsTnI) values and significant  changes across serial measurements may suggest ACS but many other  chronic and acute conditions are known to elevate hsTnI results.  Refer to the Links section for chest pain algorithms and additional  guidance. Performed at [facility], [address] [place], [place] [address]   Protime-INR     Status: None   Collection Time: 08/16/23  9:50 PM  Result Value Ref Range   Prothrombin Time 13.2 11.4 - 15.2 seconds   INR 1.0 0.8 - 1.2    Comment: (NOTE) INR goal varies based on device and disease states. Performed at [facility], [address] [place], [place] [address]   APTT     Status: None   Collection Time: 08/16/23  9:50 PM  Result Value Ref Range   aPTT 30 24 - 36 seconds    Comment: Performed at [facility], [address] [place], [place] [address]   CT Angio Chest/Abd/Pel for Dissection W and/or W/WO Result Date: 08/16/2023 CLINICAL DATA:  Acute aortic syndrome suspected, dissection EXAM: CT ANGIOGRAPHY CHEST, ABDOMEN AND PELVIS TECHNIQUE: Non-contrast CT of the chest was initially obtained. Multidetector CT imaging through the chest, abdomen and pelvis was performed using the standard protocol during bolus administration of intravenous contrast. Multiplanar reconstructed images and MIPs were obtained and reviewed to evaluate the vascular anatomy. RADIATION DOSE REDUCTION: This exam was performed according to the departmental dose-optimization program which includes automated exposure control, adjustment of the mA and/or kV according to patient size and/or use of iterative  reconstruction technique. CONTRAST:  OMNIPAQUE  IOHEXOL  350 MG/ML SOLN COMPARISON:  None Available. FINDINGS: CTA CHEST FINDINGS VASCULAR Aorta: Satisfactory opacification of the aorta. Normal contour and caliber of the thoracic aorta. No evidence of aneurysm, dissection, or other acute aortic pathology. Cardiovascular: Positive examination for pulmonary embolism, with nonocclusive lobar to segmental embolus present in the right lower lobe (series 11, image 77, 88). Mild cardiomegaly. Gross enlargement of the main pulmonary artery measuring up to 4.4 cm in caliber. No pericardial effusion. Review of the MIP images confirms the above findings. NON VASCULAR Mediastinum/Nodes: No enlarged mediastinal, hilar, or axillary lymph nodes. Small hiatal  hernia. Thyroid  gland, trachea, and esophagus demonstrate no significant findings. Lungs/Pleura: Lungs are clear. No pleural effusion or pneumothorax. Musculoskeletal: No chest wall abnormality. No acute osseous findings. Review of the MIP images confirms the above findings. CTA ABDOMEN AND PELVIS FINDINGS VASCULAR Normal contour and caliber of the abdominal aorta. No evidence of aneurysm, dissection, or other acute aortic pathology. Standard branching pattern of the abdominal aorta with solitary bilateral renal arteries. Review of the MIP images confirms the above findings. NON-VASCULAR Hepatobiliary: No solid liver abnormality is seen. Hepatic steatosis. No gallstones, gallbladder wall thickening, or biliary dilatation. Pancreas: Unremarkable. No pancreatic ductal dilatation or surrounding inflammatory changes. Spleen: Normal in size without significant abnormality. Adrenals/Urinary Tract: Adrenal glands are unremarkable. Kidneys are normal, without renal calculi, solid lesion, or hydronephrosis. Bladder is unremarkable. Stomach/Bowel: Stomach is within normal limits. Periampullary descending duodenal diverticulum. Appendix appears normal. No evidence of bowel wall  thickening, distention, or inflammatory changes. Pancolonic diverticulosis. Lymphatic: No enlarged abdominal or pelvic lymph nodes. Reproductive: Status post hysterectomy. Other: No abdominal wall hernia or abnormality. No ascites. Musculoskeletal: No acute osseous findings. IMPRESSION: 1. Positive examination for pulmonary embolism, with nonocclusive lobar to segmental embolus present in the right lower lobe. 2. Normal contour and caliber of the thoracic and abdominal aorta. No evidence of aneurysm, dissection, or other acute aortic pathology. No significant atherosclerosis. 3. Mild cardiomegaly. Gross enlargement of the main pulmonary artery measuring up to 4.4 cm in caliber, as can be seen in pulmonary hypertension. Note that this is likely incidental to pulmonary embolism given modest volume of nonocclusive embolus. 4. Hepatic steatosis. Critical findings reported by telephone to Dr. Mannie, 9:36 p.m., 08/16/2023 Electronically Signed   By: Marolyn JONETTA Jaksch M.D.   On: 08/16/2023 21:38   DG Chest 2 View Result Date: 08/16/2023 CLINICAL DATA:  Chest pain EXAM: CHEST - 2 VIEW COMPARISON:  Chest x-ray 02/20/2023 FINDINGS: The heart and mediastinal contours are unchanged. No focal consolidation. No pulmonary edema. No pleural effusion. No pneumothorax. No acute osseous abnormality. IMPRESSION: No active cardiopulmonary disease. Electronically Signed   By: Morgane  Naveau M.D.   On: 08/16/2023 19:30    Pending Labs [employer] (From admission, onward)     Start     Ordered   Signed and Held  HIV Antibody (routine testing w rflx)  (HIV Antibody (Routine testing w reflex) panel)  Once,   R        Signed and Held   Signed and Held  CBC  (enoxaparin  (LOVENOX )    CrCl >/= 30 ml/min)  Once,   R       Comments: Baseline for enoxaparin  therapy IF NOT ALREADY DRAWN.  Notify MD if PLT < 100 K.    Signed and Held   Signed and Held  Creatinine, serum  (enoxaparin  (LOVENOX )    CrCl >/= 30 ml/min)  Once,   R        Comments: Baseline for enoxaparin  therapy IF NOT ALREADY DRAWN.    Signed and Held   Signed and Held  Creatinine, serum  (enoxaparin  (LOVENOX )    CrCl >/= 30 ml/min)  Weekly,   R     Comments: while on enoxaparin  therapy    Signed and Held   Signed and Held  Basic metabolic panel  Tomorrow morning,   R        Signed and Held   Signed and Held  CBC  Tomorrow morning,   R        Signed and  Held            Vitals/Pain Today's Vitals   08/16/23 1859 08/16/23 1902 08/16/23 2000 08/16/23 2030  BP: (!) 170/69  136/61 135/64  Pulse: 94  81 78  Resp: 18  (!) 22 18  Temp: 98.8 F (37.1 C)     TempSrc: Oral     SpO2: 96%  97% 96%  Weight: 275 lb (124.7 kg)     Height: 5' 5 (1.651 m)     PainSc:  6       Isolation Precautions No active isolations  Medications Medications  heparin  bolus via infusion 4,000 Units (has no administration in time range)    Followed by  heparin  ADULT infusion 100 units/mL (25000 units/250mL) (has no administration in time range)  amLODipine  (NORVASC ) tablet 5 mg (has no administration in time range)  glipiZIDE  (GLUCOTROL  XL) 24 hr tablet 10 mg (has no administration in time range)  hydrochlorothiazide  (HYDRODIURIL ) tablet 25 mg (has no administration in time range)  isosorbide  mononitrate (IMDUR ) 24 hr tablet 60 mg (has no administration in time range)  levothyroxine  (SYNTHROID ) tablet 150 mcg (has no administration in time range)  losartan  (COZAAR ) tablet 100 mg (has no administration in time range)  rosuvastatin  (CRESTOR ) tablet 10 mg (has no administration in time range)  spironolactone  (ALDACTONE ) tablet 25 mg (has no administration in time range)  iohexol  (OMNIPAQUE ) 350 MG/ML injection 100 mL (100 mLs Intravenous Contrast Given 08/16/23 2116)  lactated ringers  bolus 1,000 mL (1,000 mLs Intravenous New Bag/Given 08/16/23 2154)    Mobility walks     Focused Assessments    R Recommendations: See Admitting Provider Note  Report given to:    Additional Notes:

## 2023-08-16 NOTE — Assessment & Plan Note (Addendum)
 Patient found to have pulmonary embolism based on air travel in a cramped airline seat and CTA of the chest abnormality accounting for her right-sided chest pain which was pleuritic in nature. -Admit to hospital in inpatient setting -Begin heparin  drip -Monitor response to treatment -Convert to oral anticoagulation 24 to 48 hours

## 2023-08-16 NOTE — ED Notes (Signed)
 Save blue tube in lab

## 2023-08-16 NOTE — H&P (Addendum)
 History and Physical    Patient: Tiffany Strickland FMW:[id number] DOB: [birth year] DOA: 08/16/2023 DOS: the patient was seen and examined on 08/16/2023 PCP: Rik Glinda DASEN, FNP  Patient coming from: Home  Chief Complaint:  Chief Complaint  Patient presents with   Back Pain   Breast Pain   HPI: Tiffany Strickland is a 68 y.o. female with medical history significant of hyperlipidemia, diabetes mellitus type 2, sleep apnea, prior stroke who presents today after a trip to [place] where she stayed.  She says she was cramped in a plane.  She felt well thereafter on arrival however on Friday morning developed chest pain on the right side that is pleuritic in nature going from the front to the back.  Evaluation in the emergency department revealed an unremarkable chest x-ray but a CTA Neri emboli.  She was also noted to have fairly significant pulmonary artery hypertension and an acute kidney injury.  For these reasons she was referred to hospital medicine.   Review of Systems: As mentioned in the history of present illness. All other systems reviewed and are negative. Past Medical History:  Diagnosis Date   Colon polyp    adenomatous   Diabetes mellitus without complication (HCC)    Hyperlipidemia    Hypertension    Sleep apnea    Stroke [facility])    Thyroid  disease    Past Surgical History:  Procedure Laterality Date   ABDOMINAL HYSTERECTOMY     BLADDER SUSPENSION     COLONOSCOPY WITH PROPOFOL  N/A 05/05/2020   Procedure: COLONOSCOPY WITH PROPOFOL ;  Surgeon: Rollin Dover, MD;  Location: [facility];  Service: Endoscopy;  Laterality: N/A;   EUS N/A 08/12/2013   Procedure: LOWER ENDOSCOPIC ULTRASOUND (EUS);  Surgeon: Toribio SHAUNNA Cedar, MD;  Location: [facility];  Service: Endoscopy;  Laterality: N/A;   KNEE SURGERY     POLYPECTOMY  05/05/2020   Procedure: POLYPECTOMY;  Surgeon: Rollin Dover, MD;  Location: [facility];  Service: Endoscopy;;   TUBAL LIGATION     Social History:  reports that  she has never smoked. She has never used smokeless tobacco. She reports that she does not drink alcohol and does not use drugs.  Allergies  Allergen Reactions   Pollen Extract Other (See Comments)    Seasonal allergies   Tomato Itching   Wool Alcohol [Lanolin] Itching    Reaction unknown    Family History  Problem Relation Age of Onset   Hypertension Mother    Heart disease Maternal Grandmother    Cancer Maternal Grandmother        ovarian   Cancer Maternal Grandfather    Hypertension Other    Hyperlipidemia Other    Cancer Other    Sleep apnea Other    Obesity Other     Prior to Admission medications   Medication Sig Start Date End Date Taking? Authorizing Provider  amLODipine  (NORVASC ) 5 MG tablet Take 5 mg by mouth daily. 10/26/19  Yes [provider]  aspirin  (ASPIR-81) 81 MG EC tablet Take 1 tablet (81 mg total) by mouth daily. Swallow whole. 11/08/15  Yes Newlin, Enobong, MD  benzonatate  (TESSALON ) 100 MG capsule Oral 02/28/23  Yes [provider]  glipiZIDE  (GLUCOTROL  XL) 10 MG 24 hr tablet Take 10 mg by mouth daily. 08/15/21  Yes [provider]  hydrochlorothiazide  (HYDRODIURIL ) 25 MG tablet Take 1 tablet (25 mg total) by mouth daily. 07/07/18  Yes Newlin, Enobong, MD  isosorbide  mononitrate (IMDUR ) 60 MG 24 hr tablet Take  60 mg by mouth daily.   Yes [provider]  nystatin  (MYCOSTATIN /NYSTOP ) powder 1 Application. 04/23/23  Yes [provider]  Accu-Chek FastClix Lancets MISC SMARTSIG:1 Topical Daily 12/28/20   [provider]  azelastine (OPTIVAR) 0.05 % ophthalmic solution SMARTSIG:1 Drop(s) In Eye(s) Twice Daily PRN 05/10/21   [provider]  Azelastine HCl 137 MCG/SPRAY SOLN Place 1 spray into both nostrils 2 (two) times daily. 11/16/21   [provider]  Blood Glucose Monitoring Suppl (RELION PRIME MONITOR) DEVI Use as directed once daily 08/06/18   Newlin, Enobong, MD  Blood Glucose Monitoring Suppl  (TRUE METRIX METER) DEVI 1 each by Does not apply route daily before breakfast. 07/14/18   Newlin, Enobong, MD  diclofenac Sodium (VOLTAREN) 1 % GEL Apply 4 g topically 4 (four) times daily as needed for pain. 09/08/20   [provider]  furosemide (LASIX) 20 MG tablet Take 20 mg by mouth daily as needed. 10/03/20   [provider]  glucose blood (RELION PRIME TEST) test strip Use as instructed once daily 08/06/18   Newlin, Enobong, MD  ibuprofen  (ADVIL ) 600 MG tablet Take 600 mg by mouth 3 (three) times daily. 05/24/21   [provider]  levothyroxine  (EUTHYROX ) 150 MCG tablet Take 1 tablet (150 mcg total) by mouth daily before breakfast. Must have office visit for refills. Patient taking differently: Take 150 mcg by mouth daily before breakfast. Must have office visit for refills.0.5 tablet MWF 1 tab SS TU TH 11/10/18   Newlin, Enobong, MD  Lidocaine  3.5 % PTCH Apply 1 patch topically every 12 (twelve) hours as needed for pain. 09/11/20   [provider]  losartan  (COZAAR ) 100 MG tablet Take 1 tablet (100 mg total) by mouth daily. 07/07/18   Newlin, Enobong, MD  Multiple Vitamin (MULTIVITAMIN PO) Take 1 tablet by mouth daily.    [provider]  nitroGLYCERIN  (NITROSTAT ) 0.4 MG SL tablet Place 1 tablet (0.4 mg total) under the tongue every 5 (five) minutes as needed for chest pain. 07/07/18   Delbert Clam, MD  omeprazole  (PRILOSEC) 20 MG capsule 1 capsule 1/2 to 1 hour before morning meal Orally Once a day    [provider]  potassium chloride (KLOR-CON) 10 MEQ tablet Take 10 mEq by mouth daily. 09/07/20   [provider]  rosuvastatin  (CRESTOR ) 10 MG tablet Take 10 mg by mouth at bedtime. 11/03/19   [provider]  spironolactone  (ALDACTONE ) 25 MG tablet Take 25 mg by mouth daily.    [provider]  vitamin C (ASCORBIC ACID) 500 MG tablet Take 500 mg by mouth daily.    [provider]    Physical Exam: Vitals:    08/16/23 1859 08/16/23 2000 08/16/23 2030  BP: (!) 170/69 136/61 135/64  Pulse: 94 81 78  Resp: 18 (!) 22 18  Temp: 98.8 F (37.1 C)    TempSrc: Oral    SpO2: 96% 97% 96%  Weight: 124.7 kg    Height: 5' 5 (1.651 m)     Physical Exam Constitutional:      General: She is not in acute distress.    Appearance: She is obese.  HENT:     Head: Normocephalic and atraumatic.     Nose: Nose normal.     Mouth/Throat:     Mouth: Mucous membranes are moist.     Pharynx: Oropharynx is clear.  Eyes:     Extraocular Movements: Extraocular movements intact.     Pupils: Pupils  are equal, round, and reactive to light.  Cardiovascular:     Rate and Rhythm: Normal rate and regular rhythm.     Pulses: Normal pulses.  Pulmonary:     Effort: Pulmonary effort is normal.     Breath sounds: Normal breath sounds. No wheezing, rhonchi or rales.  Abdominal:     General: Bowel sounds are normal. There is no distension.     Palpations: Abdomen is soft.     Tenderness: There is no abdominal tenderness.  Musculoskeletal:        General: No swelling or deformity. Normal range of motion.     Cervical back: Normal range of motion and neck supple.     Right lower leg: No edema.     Left lower leg: No edema.  Skin:    General: Skin is warm and dry.     Capillary Refill: Capillary refill takes less than 2 seconds.  Neurological:     General: No focal deficit present.     Mental Status: She is alert and oriented to person, place, and time. Mental status is at baseline.  Psychiatric:        Mood and Affect: Mood normal.        Behavior: Behavior normal.     Data Reviewed: Creatinine 1.5 GFR 43, glucose 122 EKG personally viewed by fz:Dpwld rhythm IVCD, consider atypical LBBB Chest x-ray shows no active cardiopulmonary disease personally viewed by me CTA of the chest: IMPRESSION: 1. Positive examination for pulmonary embolism, with nonocclusive lobar to segmental embolus present in the right lower  lobe. 2. Normal contour and caliber of the thoracic and abdominal aorta. No evidence of aneurysm, dissection, or other acute aortic pathology. No significant atherosclerosis. 3. Mild cardiomegaly. Gross enlargement of the main pulmonary artery measuring up to 4.4 cm in caliber, as can be seen in pulmonary hypertension. Note that this is likely incidental to pulmonary embolism given modest volume of nonocclusive embolus. 4. Hepatic steatosis.  Assessment and Plan: * Acute pulmonary embolism (HCC) Patient found to have pulmonary embolism based on air travel in a cramped airline seat and CTA of the chest abnormality accounting for her right-sided chest pain which was pleuritic in nature. -Admit to hospital in inpatient setting -Begin heparin  drip -Monitor response to treatment -Convert to oral anticoagulation 24 to 48 hours  Pulmonary hypertension (HCC) Patient with long pulmonary hypertension noted initially on CT scan in 2018 as evidenced by pulmonary artery. -Monitor closely -Conservative care -Consider pulmonary consultation as out patient  Elevated blood pressure reading In the setting of known hypertension.  Currently with systolic blood pressure 160.  Has apparently missed some of her blood pressure medications today.  Will order her medications and monitor  Acute kidney injury [facility]) Patient with change in her creatinine from 0.80 to 1.35 in 5 months - Will hydrate patient for now -Recheck BUN and creatinine in a.m. -Avoid nephrotoxic agents      Advance Care Planning: Full code  Consults: None  Family Communication: No one present at the time of admission.  Patient retains capacity.  Severity of Illness: The appropriate patient status for this patient is INPATIENT. Inpatient status is judged to be reasonable and necessary in order to provide the required intensity of service to ensure the patient's safety. The patient's presenting symptoms, physical exam findings, and  initial radiographic and laboratory data in the context of their chronic comorbidities is felt to place them at high risk for further clinical deterioration. Furthermore, it is  not anticipated that the patient will be medically stable for discharge from the hospital within 2 midnights of admission.   * I certify that at the point of admission it is my clinical judgment that the patient will require inpatient hospital care spanning beyond 2 midnights from the point of admission due to high intensity of service, high risk for further deterioration and high frequency of surveillance required.*  Author: Zebedee JAYSON Fujisawa, MD, FACP 08/16/2023 10:23 PM  For on call review [email or web address].

## 2023-08-16 NOTE — ED Triage Notes (Signed)
 Pt reports R sided upper back pain radiating into her R breast since yesterday morning. No SHOB. No CP.

## 2023-08-16 NOTE — Assessment & Plan Note (Addendum)
 08-17-2023 In the setting of known hypertension.  Currently with systolic blood pressure 160.  Has apparently missed some of her blood pressure medications today.  Will order her medications and monitor  08-18-2023 stable.

## 2023-08-16 NOTE — ED Provider Notes (Signed)
 [place] EMERGENCY DEPARTMENT AT [facility] Provider Note  CSN: [id number] Arrival date & time: 08/16/23 1847  Chief Complaint(s) Back Pain and Breast Pain  HPI Tiffany Strickland is a 68 y.o. female here today with pain on the right side of her chest.  Patient says that yesterday, she felt as though there is pain in her back that was radiating to the front of her chest.  Says it is worse when she takes a deep breath, does not notice it is worse when she ambulates.  Today, it actually feels a bit better, but seems to be more constant.  Patient is also concerned that she might have been exposed to mold and mildew at home.  Did have a recent travel to [place] for a girls trip.  Says that while there, one of her travel partners and depending on the hospital and the patient's been a significant time visiting her in the hospital.   Past Medical History Past Medical History:  Diagnosis Date   Colon polyp    adenomatous   Diabetes mellitus without complication (HCC)    Hyperlipidemia    Hypertension    Sleep apnea    Stroke [facility])    Thyroid  disease    Patient Active Problem List   Diagnosis Date Noted   Posterior neck pain 05/29/2021   Chronic idiopathic constipation 06/21/2020   Diverticular disease of colon 06/21/2020   Change in bowel habit 03/15/2020   Epigastric pain 03/15/2020   Gastroesophageal reflux disease 03/15/2020   Other specified bacterial intestinal infections 03/15/2020   Pulmonary HTN (HCC) 06/29/2018   Educated about COVID-19 virus infection 06/29/2018   OSA (obstructive sleep apnea) 02/03/2017   Hypothyroidism 02/02/2017   Chest pain 02/02/2017   Morbid obesity (HCC) 01/27/2017   Plantar fasciitis of left foot 11/08/2015   Non-insulin  treated type 2 diabetes mellitus (HCC) 06/08/2015   Colon cancer screening 06/08/2015   Bilateral leg edema 12/15/2014   Blurry vision, bilateral 06/16/2014   Breast cancer screening 06/16/2014   Other specified  hypothyroidism 06/16/2014   Other seasonal allergic rhinitis 06/16/2014   History of stroke 01/31/2014   Rectal nodule 09/06/2013   Essential hypertension 08/24/2013   Nonspecific (abnormal) findings on radiological and other examination of gastrointestinal tract 08/12/2013   Lateral epicondylitis of right elbow 07/21/2013   Right shoulder pain 07/21/2013   Sprain of wrist, right 07/21/2013   Constipation 04/15/2013   Rectal bleeding 04/15/2013   Home Medication(s) Prior to Admission medications   Medication Sig Start Date End Date Taking? Authorizing Provider  Accu-Chek FastClix Lancets MISC SMARTSIG:1 Topical Daily 12/28/20   [provider]  amLODipine  (NORVASC ) 5 MG tablet Take 5 mg by mouth daily. 10/26/19   [provider]  aspirin  (ASPIR-81) 81 MG EC tablet Take 1 tablet (81 mg total) by mouth daily. Swallow whole. 11/08/15   Newlin, Enobong, MD  azelastine (OPTIVAR) 0.05 % ophthalmic solution SMARTSIG:1 Drop(s) In Eye(s) Twice Daily PRN 05/10/21   [provider]  Azelastine HCl 137 MCG/SPRAY SOLN Place 1 spray into both nostrils 2 (two) times daily. 11/16/21   [provider]  Blood Glucose Monitoring Suppl (RELION PRIME MONITOR) DEVI Use as directed once daily 08/06/18   Newlin, Enobong, MD  Blood Glucose Monitoring Suppl (TRUE METRIX METER) DEVI 1 each by Does not apply route daily before breakfast. 07/14/18   Newlin, Enobong, MD  diclofenac Sodium (VOLTAREN) 1 % GEL Apply 4 g topically 4 (four) times daily as needed for  pain. 09/08/20   [provider]  furosemide (LASIX) 20 MG tablet Take 20 mg by mouth daily as needed. 10/03/20   [provider]  glipiZIDE  (GLUCOTROL  XL) 10 MG 24 hr tablet Take 10 mg by mouth daily. 08/15/21   [provider]  glucose blood (RELION PRIME TEST) test strip Use as instructed once daily 08/06/18   Newlin, Enobong, MD  hydrochlorothiazide  (HYDRODIURIL ) 25 MG tablet Take 1 tablet (25 mg total) by  mouth daily. 07/07/18   Newlin, Enobong, MD  ibuprofen  (ADVIL ) 600 MG tablet Take 600 mg by mouth 3 (three) times daily. 05/24/21   [provider]  isosorbide  mononitrate (IMDUR ) 60 MG 24 hr tablet Take 60 mg by mouth daily.    [provider]  levothyroxine  (EUTHYROX ) 150 MCG tablet Take 1 tablet (150 mcg total) by mouth daily before breakfast. Must have office visit for refills. Patient taking differently: Take 150 mcg by mouth daily before breakfast. Must have office visit for refills.0.5 tablet MWF 1 tab SS TU TH 11/10/18   Newlin, Enobong, MD  Lidocaine  3.5 % PTCH Apply 1 patch topically every 12 (twelve) hours as needed for pain. 09/11/20   [provider]  losartan  (COZAAR ) 100 MG tablet Take 1 tablet (100 mg total) by mouth daily. 07/07/18   Newlin, Enobong, MD  Multiple Vitamin (MULTIVITAMIN PO) Take 1 tablet by mouth daily.    [provider]  nitroGLYCERIN  (NITROSTAT ) 0.4 MG SL tablet Place 1 tablet (0.4 mg total) under the tongue every 5 (five) minutes as needed for chest pain. 07/07/18   Newlin, Enobong, MD  potassium chloride (KLOR-CON) 10 MEQ tablet Take 10 mEq by mouth daily. 09/07/20   [provider]  rosuvastatin  (CRESTOR ) 10 MG tablet Take 10 mg by mouth at bedtime. 11/03/19   [provider]  spironolactone  (ALDACTONE ) 25 MG tablet Take 25 mg by mouth daily.    [provider]  vitamin C (ASCORBIC ACID) 500 MG tablet Take 500 mg by mouth daily.    [provider]                                                                                                                                    Past Surgical History Past Surgical History:  Procedure Laterality Date   ABDOMINAL HYSTERECTOMY     BLADDER SUSPENSION     COLONOSCOPY WITH PROPOFOL  N/A 05/05/2020   Procedure: COLONOSCOPY WITH PROPOFOL ;  Surgeon: Rollin Dover, MD;  Location: [facility];  Service: Endoscopy;  Laterality: N/A;   EUS N/A 08/12/2013    Procedure: LOWER ENDOSCOPIC ULTRASOUND (EUS);  Surgeon: Toribio SHAUNNA Cedar, MD;  Location: [facility];  Service: Endoscopy;  Laterality: N/A;   KNEE SURGERY     POLYPECTOMY  05/05/2020   Procedure: POLYPECTOMY;  Surgeon: Rollin Dover, MD;  Location: [facility];  Service: Endoscopy;;   TUBAL LIGATION     Family History Family  History  Problem Relation Age of Onset   Hypertension Mother    Heart disease Maternal Grandmother    Cancer Maternal Grandmother        ovarian   Cancer Maternal Grandfather    Hypertension Other    Hyperlipidemia Other    Cancer Other    Sleep apnea Other    Obesity Other     Social History Social History   Tobacco Use   Smoking status: Never   Smokeless tobacco: Never  Vaping Use   Vaping status: Never Used  Substance Use Topics   Alcohol use: No   Drug use: No   Allergies Pollen extract, Tomato, and Wool alcohol [lanolin]  Review of Systems Review of Systems  Physical Exam Vital Signs  I have reviewed the triage vital signs BP 135/64   Pulse 78   Temp 98.8 F (37.1 C) (Oral)   Resp 18   Ht 5' 5 (1.651 m)   Wt 124.7 kg   SpO2 96%   BMI 45.76 kg/m   Physical Exam Vitals and nursing note reviewed.  HENT:     Head: Normocephalic.  Eyes:     Pupils: Pupils are equal, round, and reactive to light.  Cardiovascular:     Rate and Rhythm: Normal rate and regular rhythm.     Pulses: Normal pulses.     Heart sounds: Normal heart sounds.  Pulmonary:     Effort: Pulmonary effort is normal.  Abdominal:     General: Abdomen is flat. There is no distension.     Palpations: Abdomen is soft.     Tenderness: There is no abdominal tenderness. There is no guarding.  Skin:    General: Skin is warm.  Neurological:     General: No focal deficit present.     Mental Status: She is alert.     Gait: Gait normal.     ED Results and Treatments Labs (all labs ordered are listed, but only abnormal results are displayed) Labs Reviewed  BASIC  METABOLIC PANEL WITH GFR - Abnormal; Notable for the following components:      Result Value   Glucose, Bld 122 (*)    BUN 28 (*)    Creatinine, Ser 1.35 (*)    GFR, Estimated 43 (*)    All other components within normal limits  CBC - Abnormal; Notable for the following components:   Hemoglobin 11.4 (*)    All other components within normal limits  PROTIME-INR  APTT  TROPONIN I (HIGH SENSITIVITY)  TROPONIN I (HIGH SENSITIVITY)                                                                                                                          Radiology CT Angio Chest/Abd/Pel for Dissection W and/or W/WO Result Date: 08/16/2023 CLINICAL DATA:  Acute aortic syndrome suspected, dissection EXAM: CT ANGIOGRAPHY CHEST, ABDOMEN AND PELVIS TECHNIQUE: Non-contrast CT of the chest was initially obtained. Multidetector CT imaging through the chest,  abdomen and pelvis was performed using the standard protocol during bolus administration of intravenous contrast. Multiplanar reconstructed images and MIPs were obtained and reviewed to evaluate the vascular anatomy. RADIATION DOSE REDUCTION: This exam was performed according to the departmental dose-optimization program which includes automated exposure control, adjustment of the mA and/or kV according to patient size and/or use of iterative reconstruction technique. CONTRAST:  OMNIPAQUE  IOHEXOL  350 MG/ML SOLN COMPARISON:  None Available. FINDINGS: CTA CHEST FINDINGS VASCULAR Aorta: Satisfactory opacification of the aorta. Normal contour and caliber of the thoracic aorta. No evidence of aneurysm, dissection, or other acute aortic pathology. Cardiovascular: Positive examination for pulmonary embolism, with nonocclusive lobar to segmental embolus present in the right lower lobe (series 11, image 77, 88). Mild cardiomegaly. Gross enlargement of the main pulmonary artery measuring up to 4.4 cm in caliber. No pericardial effusion. Review of the MIP images  confirms the above findings. NON VASCULAR Mediastinum/Nodes: No enlarged mediastinal, hilar, or axillary lymph nodes. Small hiatal hernia. Thyroid  gland, trachea, and esophagus demonstrate no significant findings. Lungs/Pleura: Lungs are clear. No pleural effusion or pneumothorax. Musculoskeletal: No chest wall abnormality. No acute osseous findings. Review of the MIP images confirms the above findings. CTA ABDOMEN AND PELVIS FINDINGS VASCULAR Normal contour and caliber of the abdominal aorta. No evidence of aneurysm, dissection, or other acute aortic pathology. Standard branching pattern of the abdominal aorta with solitary bilateral renal arteries. Review of the MIP images confirms the above findings. NON-VASCULAR Hepatobiliary: No solid liver abnormality is seen. Hepatic steatosis. No gallstones, gallbladder wall thickening, or biliary dilatation. Pancreas: Unremarkable. No pancreatic ductal dilatation or surrounding inflammatory changes. Spleen: Normal in size without significant abnormality. Adrenals/Urinary Tract: Adrenal glands are unremarkable. Kidneys are normal, without renal calculi, solid lesion, or hydronephrosis. Bladder is unremarkable. Stomach/Bowel: Stomach is within normal limits. Periampullary descending duodenal diverticulum. Appendix appears normal. No evidence of bowel wall thickening, distention, or inflammatory changes. Pancolonic diverticulosis. Lymphatic: No enlarged abdominal or pelvic lymph nodes. Reproductive: Status post hysterectomy. Other: No abdominal wall hernia or abnormality. No ascites. Musculoskeletal: No acute osseous findings. IMPRESSION: 1. Positive examination for pulmonary embolism, with nonocclusive lobar to segmental embolus present in the right lower lobe. 2. Normal contour and caliber of the thoracic and abdominal aorta. No evidence of aneurysm, dissection, or other acute aortic pathology. No significant atherosclerosis. 3. Mild cardiomegaly. Gross enlargement of the  main pulmonary artery measuring up to 4.4 cm in caliber, as can be seen in pulmonary hypertension. Note that this is likely incidental to pulmonary embolism given modest volume of nonocclusive embolus. 4. Hepatic steatosis. Critical findings reported by telephone to Dr. Mannie, 9:36 p.m., 08/16/2023 Electronically Signed   By: Marolyn JONETTA Jaksch M.D.   On: 08/16/2023 21:38   DG Chest 2 View Result Date: 08/16/2023 CLINICAL DATA:  Chest pain EXAM: CHEST - 2 VIEW COMPARISON:  Chest x-ray 02/20/2023 FINDINGS: The heart and mediastinal contours are unchanged. No focal consolidation. No pulmonary edema. No pleural effusion. No pneumothorax. No acute osseous abnormality. IMPRESSION: No active cardiopulmonary disease. Electronically Signed   By: Morgane  Naveau M.D.   On: 08/16/2023 19:30    Pertinent labs & imaging results that were available during my care of the patient were reviewed by me and considered in my medical decision making (see MDM for details).  Medications Ordered in ED Medications  heparin  bolus via infusion 4,000 Units (has no administration in time range)    Followed by  heparin  ADULT infusion 100 units/mL (25000 units/250mL) (has no administration  in time range)  iohexol  (OMNIPAQUE ) 350 MG/ML injection 100 mL (100 mLs Intravenous Contrast Given 08/16/23 2116)  lactated ringers  bolus 1,000 mL (1,000 mLs Intravenous New Bag/Given 08/16/23 2154)                                                                                                                                     Procedures Procedures  (including critical care time)  Medical Decision Making / ED Course   This patient presents to the ED for concern of right-sided chest pain with radiation from the back to the front of the chest, this involves an extensive number of treatment options, and is a complaint that carries with it a high risk of complications and morbidity.  The differential diagnosis includes musculoskeletal pain,  dissection, ACS, less likely PE, pleurisy.  MDM: Patient overall well-appearing on exam, however symptoms are concerning for dissection versus pulmonary embolism.  Will obtain dissection studies on the patient.  Patient also with some pain in her right arm.  Reassessment 9:55 PM-patient with pulmonary embolism.  Believe that she likely does have a DVT in her right upper extremity given the identified pulmonary embolism in the lungs, and the sensation of fullness and pain in her right arm.  She has good pulses.  Do not have access to vascular ultrasound at this hour, however given the known PE will start the patient on heparin .  Patient also with AKI.  Given the patient's acute kidney injury, pulmonary medicine likely right upper extremity DVT, she is not a candidate to be discharged with oral anticoagulants at this time.  Patient requires further stabilization and medical management.  Patient admitted to hospitalist service.  Additional history obtained: -Additional history obtained from  -External records from outside source obtained and reviewed including: Chart review including previous notes, labs, imaging, consultation notes   Lab Tests: -I ordered, reviewed, and interpreted labs.   The pertinent results include:   Labs Reviewed  BASIC METABOLIC PANEL WITH GFR - Abnormal; Notable for the following components:      Result Value   Glucose, Bld 122 (*)    BUN 28 (*)    Creatinine, Ser 1.35 (*)    GFR, Estimated 43 (*)    All other components within normal limits  CBC - Abnormal; Notable for the following components:   Hemoglobin 11.4 (*)    All other components within normal limits  PROTIME-INR  APTT  TROPONIN I (HIGH SENSITIVITY)  TROPONIN I (HIGH SENSITIVITY)      EKG my independent review of the patient's EKG shows no ST segment depressions or elevations, no T wave inversions, no evidence of acute ischemia.  EKG Interpretation Date/Time:  Saturday August 16 2023 19:04:30  EDT Ventricular Rate:  82 PR Interval:  173 QRS Duration:  143 QT Interval:  421 QTC Calculation: 492 R Axis:   18  Text Interpretation: Sinus rhythm IVCD, consider atypical LBBB  Confirmed by Mannie Pac [id number]) on 08/16/2023 8:52:54 PM         Imaging Studies ordered: I ordered imaging studies including CTA of the chest I independently visualized and interpreted imaging. I agree with the radiologist interpretation   Medicines ordered and prescription drug management: Meds ordered this encounter  Medications   iohexol  (OMNIPAQUE ) 350 MG/ML injection 100 mL   lactated ringers  bolus 1,000 mL   FOLLOWED BY Linked Order Group    heparin  bolus via infusion 4,000 Units    heparin  ADULT infusion 100 units/mL (25000 units/250mL)    -I have reviewed the patients home medicines and have made adjustments as needed  Critical interventions Anticoagulation for pulmonary embolism  Cardiac Monitoring: The patient was maintained on a cardiac monitor.  I personally viewed and interpreted the cardiac monitored which showed an underlying rhythm of: Normal sinus rhythm  Reevaluation: After the interventions noted above, I reevaluated the patient and found that they have :improved  Co morbidities that complicate the patient evaluation  Past Medical History:  Diagnosis Date   Colon polyp    adenomatous   Diabetes mellitus without complication (HCC)    Hyperlipidemia    Hypertension    Sleep apnea    Stroke (HCC)    Thyroid  disease        Final Clinical Impression(s) / ED Diagnoses Final diagnoses:  Other acute pulmonary embolism without acute cor pulmonale (HCC)  Acute kidney injury (HCC)     @PCDICTATION @    Mannie Pac T, DO 08/16/23 2156

## 2023-08-16 NOTE — ED Provider Triage Note (Signed)
 Emergency Medicine Provider Triage Evaluation Note  Tiffany Strickland , a 68 y.o. female  was evaluated in triage.  Pt complains of feeling pressure radiating from my back to my R breast, like someone is punching through me. Pressure started since yesterday, worse with taking deep breaths. Not worse on exertion.    Also concerned with some swelling perioral swelling, concerned for exposure to mold/mildew that has been intermittent for a couple of months.   Denies fever, headaches, vision changes, shortness of breath, abdominal pain, n/v/d, dysuria.   Review of Systems  Positive: See above  Negative: See above  Physical Exam  BP (!) 170/69 (BP Location: Left Arm)   Pulse 94   Temp 98.8 F (37.1 C) (Oral)   Resp 18   Ht 5' 5 (1.651 m)   Wt 124.7 kg   SpO2 96%   BMI 45.76 kg/m  Gen:   Awake, no distress   Resp:  Normal effort  MSK:   Moves extremities without difficulty  Other:    Medical Decision Making  Medically screening exam initiated at 7:33 PM.  Appropriate orders placed.  Tiffany Strickland was informed that the remainder of the evaluation will be completed by another provider, this initial triage assessment does not replace that evaluation, and the importance of remaining in the ED until their evaluation is complete.     Tiffany Strickland RAMAN, [place] 08/16/23 [phone]

## 2023-08-16 NOTE — Assessment & Plan Note (Deleted)
 Patient found to have CTA of the chest explains her right-sided chest pain which was pleuritic in nature. -Admit to hospital in inpatient setting -Begin heparin  drip -Monitor response to treatment -Convert to oral anticoagulation 24 to 48 hours

## 2023-08-16 NOTE — Progress Notes (Signed)
 PHARMACY - ANTICOAGULATION CONSULT NOTE  Pharmacy Consult for Heparin  Indication: pulmonary embolus  Allergies  Allergen Reactions   Pollen Extract Other (See Comments)    Seasonal allergies   Tomato Itching   Wool Alcohol [Lanolin] Itching    Reaction unknown    Patient Measurements: Height: 5' 5 (165.1 cm) Weight: 124.7 kg (275 lb) IBW/kg (Calculated) : 57 HEPARIN  DW (KG): 87.3  Vital Signs: Temp: 98.8 F (37.1 C) (07/05 1859) Temp Source: Oral (07/05 1859) BP: 135/64 (07/05 2030) Pulse Rate: 78 (07/05 2030)  Labs: Recent Labs    08/16/23 1940  HGB 11.4*  HCT 37.2  PLT 261  CREATININE 1.35*  TROPONINIHS 16    Estimated Creatinine Clearance: 52.2 mL/min (A) (by C-G formula based on SCr of 1.35 mg/dL (H)).   Medical History: Past Medical History:  Diagnosis Date   Colon polyp    adenomatous   Diabetes mellitus without complication (HCC)    Hyperlipidemia    Hypertension    Sleep apnea    Stroke (HCC)    Thyroid  disease     Medications:  No oral anticoagulation PTA  Assessment: 69 yr female with back pain radiating to right side of her chest.  CTAngio + PE  Goal of Therapy:  Heparin  level 0.3-0.7 units/ml Monitor platelets by anticoagulation protocol: Yes   Plan:  Obtain baseline aPTT and PT/INR Heparin  4000 unit IV bolus x 1 Heparin  gtt @ 1500 units/hr Check heparin  level 8 hr after infusion started Daily heparin  level & CBC  Arvin Gauss, PharmD 08/16/2023,9:42 PM

## 2023-08-16 NOTE — Assessment & Plan Note (Signed)
 Patient with change in her creatinine from 0.80 to 1.35 in 5 months - Will hydrate patient for now -Recheck BUN and creatinine in a.m. -Avoid nephrotoxic agents

## 2023-08-17 DIAGNOSIS — N179 Acute kidney failure, unspecified: Secondary | ICD-10-CM | POA: Diagnosis not present

## 2023-08-17 DIAGNOSIS — I272 Pulmonary hypertension, unspecified: Secondary | ICD-10-CM | POA: Diagnosis not present

## 2023-08-17 DIAGNOSIS — R030 Elevated blood-pressure reading, without diagnosis of hypertension: Secondary | ICD-10-CM | POA: Diagnosis not present

## 2023-08-17 DIAGNOSIS — I2699 Other pulmonary embolism without acute cor pulmonale: Secondary | ICD-10-CM | POA: Diagnosis not present

## 2023-08-17 DIAGNOSIS — I2609 Other pulmonary embolism with acute cor pulmonale: Secondary | ICD-10-CM | POA: Diagnosis not present

## 2023-08-17 LAB — CBC
HCT: 38.2 % (ref 36.0–46.0)
HCT: 39.1 % (ref 36.0–46.0)
Hemoglobin: 11.7 g/dL — ABNORMAL LOW (ref 12.0–15.0)
Hemoglobin: 12.3 g/dL (ref 12.0–15.0)
MCH: 29.2 pg (ref 26.0–34.0)
MCH: 29.7 pg (ref 26.0–34.0)
MCHC: 30.6 g/dL (ref 30.0–36.0)
MCHC: 31.5 g/dL (ref 30.0–36.0)
MCV: 95.3 fL (ref 80.0–100.0)
MCV: 94.4 fL (ref 80.0–100.0)
Platelets: 258 K/uL (ref 150–400)
Platelets: 251 K/uL (ref 150–400)
RBC: 4.01 MIL/uL (ref 3.87–5.11)
RBC: 4.14 MIL/uL (ref 3.87–5.11)
RDW: 13.7 % (ref 11.5–15.5)
RDW: 13.9 % (ref 11.5–15.5)
WBC: 6.4 K/uL (ref 4.0–10.5)
WBC: 5.5 K/uL (ref 4.0–10.5)
nRBC: 0 % (ref 0.0–0.2)
nRBC: 0 % (ref 0.0–0.2)

## 2023-08-17 LAB — HEPARIN LEVEL (UNFRACTIONATED)
Heparin Unfractionated: 0.76 [IU]/mL — ABNORMAL HIGH (ref 0.30–0.70)
Heparin Unfractionated: >1.1 [IU]/mL — ABNORMAL HIGH (ref 0.30–0.70)

## 2023-08-17 LAB — BASIC METABOLIC PANEL WITH GFR
Anion gap: 10 (ref 5–15)
BUN: 18 mg/dL (ref 8–23)
CO2: 24 mmol/L (ref 22–32)
Calcium: 9.1 mg/dL (ref 8.9–10.3)
Chloride: 104 mmol/L (ref 98–111)
Creatinine, Ser: 0.7 mg/dL (ref 0.44–1.00)
GFR, Estimated: >60 mL/min (ref >60)
Glucose, Bld: 159 mg/dL — ABNORMAL HIGH (ref 70–99)
Potassium: 3.9 mmol/L (ref 3.5–5.1)
Sodium: 138 mmol/L (ref 135–145)

## 2023-08-17 LAB — HIV ANTIBODY (ROUTINE TESTING W REFLEX): HIV Screen 4th Generation wRfx: REACTIVE — AB

## 2023-08-17 MED ORDER — POLYETHYLENE GLYCOL 3350 17 G PO PACK: 17.0000 g | PACK | Freq: Every day | ORAL | Status: DC | PRN

## 2023-08-17 MED ORDER — SODIUM CHLORIDE 0.9 % IV SOLN: INTRAVENOUS | Status: DC

## 2023-08-17 MED ORDER — RIVAROXABAN 20 MG PO TABS: 20.0000 mg | ORAL_TABLET | Freq: Every day | ORAL | 2 refills | Status: DC

## 2023-08-17 MED ORDER — RIVAROXABAN (XARELTO) VTE STARTER PACK (15 & 20 MG): ORAL_TABLET | ORAL | 0 refills | Status: DC

## 2023-08-17 MED ORDER — ONDANSETRON HCL 4 MG PO TABS: 4.0000 mg | ORAL_TABLET | Freq: Four times a day (QID) | ORAL | Status: DC | PRN

## 2023-08-17 MED ORDER — ACETAMINOPHEN 325 MG PO TABS: 650.0000 mg | ORAL_TABLET | Freq: Four times a day (QID) | ORAL | Status: DC | PRN

## 2023-08-17 MED ORDER — ACETAMINOPHEN 650 MG RE SUPP: 650.0000 mg | Freq: Four times a day (QID) | RECTAL | Status: DC | PRN

## 2023-08-17 MED ORDER — ONDANSETRON HCL 4 MG/2ML IJ SOLN
4.0000 mg | Freq: Four times a day (QID) | INTRAMUSCULAR | Status: DC | PRN
Start: 2023-08-17 — End: 2023-08-18

## 2023-08-17 MED ORDER — TRAZODONE HCL 50 MG PO TABS: 25.0000 mg | ORAL_TABLET | Freq: Every evening | ORAL | Status: DC | PRN

## 2023-08-17 MED ORDER — HYDRALAZINE HCL 20 MG/ML IJ SOLN: 5.0000 mg | Freq: Four times a day (QID) | INTRAMUSCULAR | Status: DC | PRN

## 2023-08-17 MED ORDER — SODIUM CHLORIDE 0.9% FLUSH
3.0000 mL | Freq: Two times a day (BID) | INTRAVENOUS | Status: DC
  Administered 2023-08-17 – 2023-08-18 (×3): 3 mL via INTRAVENOUS

## 2023-08-17 MED ORDER — TRAMADOL HCL 50 MG PO TABS: 100.0000 mg | ORAL_TABLET | Freq: Four times a day (QID) | ORAL | Status: DC | PRN

## 2023-08-17 MED ORDER — RIVAROXABAN 20 MG PO TABS: 20.0000 mg | ORAL_TABLET | Freq: Every day | ORAL | Status: DC

## 2023-08-17 MED ORDER — RIVAROXABAN 15 MG PO TABS
15.0000 mg | ORAL_TABLET | Freq: Two times a day (BID) | ORAL | Status: DC
  Administered 2023-08-17: 15 mg via ORAL
  Filled 2023-08-17: qty 1

## 2023-08-17 NOTE — Plan of Care (Signed)

## 2023-08-17 NOTE — Plan of Care (Signed)
 Patient was discharged earlier, just before leaving she had an episode of bleeding per rectum for which she was stating it was mixed with stool and way more than her normal bleeding due to hemorrhoid.  Holding Xarelto , repeat CBC was ordered and GI was consulted.  Might need to put her back on heparin  infusion if no more active bleeding at this time.  Currently just monitoring

## 2023-08-17 NOTE — Discharge Summary (Signed)
 Physician Discharge Summary   Patient: Tiffany Strickland MRN: [id number] DOB: [birth year]  Admit date:     08/16/2023  Discharge date: 08/17/23  Discharge Physician: Amaryllis Dare   PCP: Rik Glinda DASEN, FNP   Recommendations at discharge:  Please obtain CBC and BMP on follow-up Please consider holding aspirin  while taking Xarelto  if appropriate. Follow-up with primary care provider within a week.  Discharge Diagnoses: Principal Problem:   Acute pulmonary embolism (HCC) Active Problems:   Pulmonary hypertension (HCC)   Acute kidney injury (HCC)   Elevated blood pressure reading   Hospital Course: Tiffany Strickland is a 68 y.o. female with medical history significant of hyperlipidemia, diabetes mellitus type 2, sleep apnea, prior stroke who presented.  Patient has a recent travel to [place].  On presentation blood pressure elevated at 170/69, labs with concern of AKI, creatinine at 1.35 with baseline less than 1. Chest x-ray was negative for any active disease but CTA chest shows a right-sided nonocclusive lobar to subsegmental embolus.  Also shows gross enlargement of main pulmonary artery consistent with pulmonary hypertension.  No concern of right heart strain.  Patient was initially started on heparin  infusion.  7/6: Vital stable on room air.  AKI resolved.  PESI score for PE at 69 points which is low risk-transitioning heparin  with Xarelto .  Patient also had chronic bilateral lower extremity edema with some calf tenderness on right, likely a DVT which is the source of this PE.  Lower extremity venous Doppler was not done as there will not change the management at this point.  Patient with no shortness of breath or chest pain today.  She is being discharged on Xarelto -likely will need at least 3 to 6 months of anticoagulation as this PE seems provoked with recent travel.  Patient also not very mobile at baseline.  We are continuing her home baby aspirin , she need to follow-up with  her provider to discuss whether it can be discontinued or held while she is taking Xarelto .  She will continue the rest of her home medications and follow-up with her providers closely for further assistance.  Assessment and Plan: * Acute pulmonary embolism (HCC) Patient found to have pulmonary embolism based on air travel in a cramped airline seat and CTA of the chest abnormality accounting for her right-sided chest pain which was pleuritic in nature. Initially received heparin  infusion and now being transitioned to Xarelto .  Pulmonary hypertension (HCC) Patient with long pulmonary hypertension noted initially on CT scan in 2018 as evidenced by pulmonary artery. -Monitor closely -Conservative care -Consider pulmonary consultation as out patient  Elevated blood pressure reading In the setting of known hypertension.  Blood pressure improved and she will continue her home medications..  Acute kidney injury [facility]) Patient with change in her creatinine from 0.80 to 1.35 in 5 months AKI resolved with IV fluid.   Consultants: None Procedures performed: None Disposition: Home Diet recommendation:  Discharge Diet Orders (From admission, onward)     Start     Ordered   08/17/23 0000  Diet - low sodium heart healthy        08/17/23 1039           Cardiac and Carb modified diet DISCHARGE MEDICATION: Allergies as of 08/17/2023       Reactions   Pollen Extract Other (See Comments)   Seasonal allergies   Tomato Itching   Wool Alcohol [lanolin] Itching   Reaction unknown        Medication List  TAKE these medications    Accu-Chek FastClix Lancets Misc SMARTSIG:1 Topical Daily   amLODipine  5 MG tablet Commonly known as: NORVASC  Take 5 mg by mouth daily.   aspirin  EC 81 MG tablet Commonly known as: Aspirin  81 Take 1 tablet (81 mg total) by mouth daily. Swallow whole.   Azelastine HCl 137 MCG/SPRAY Soln Place 1 spray into both nostrils 2 (two) times daily as  needed.   glipiZIDE  10 MG 24 hr tablet Commonly known as: GLUCOTROL  XL Take 10 mg by mouth daily.   hydrochlorothiazide  25 MG tablet Commonly known as: HYDRODIURIL  Take 1 tablet (25 mg total) by mouth daily.   isosorbide  mononitrate 60 MG 24 hr tablet Commonly known as: IMDUR  Take 60 mg by mouth daily.   levothyroxine  150 MCG tablet Commonly known as: Euthyrox  Take 1 tablet (150 mcg total) by mouth daily before breakfast. Must have office visit for refills. What changed:  how much to take when to take this additional instructions   losartan  100 MG tablet Commonly known as: COZAAR  Take 1 tablet (100 mg total) by mouth daily.   MULTIVITAMIN PO Take 1 tablet by mouth daily.   nystatin  powder Commonly known as: MYCOSTATIN /NYSTOP  Apply 1 Application topically daily as needed.   omeprazole  20 MG capsule Commonly known as: PRILOSEC Take 20 mg by mouth daily as needed.   ReliOn Prime Test test strip Generic drug: glucose blood Use as instructed once daily   Rivaroxaban  Starter Pack (15 mg and 20 mg) Commonly known as: XARELTO  STARTER PACK Follow package directions: Take one 15mg  tablet by mouth twice a day. On day 22, switch to one 20mg  tablet once a day. Take with food.   rivaroxaban  20 MG Tabs tablet Commonly known as: Xarelto  Take 1 tablet (20 mg total) by mouth daily with supper. Start after finishing starter pack   rosuvastatin  10 MG tablet Commonly known as: CRESTOR  Take 10 mg by mouth at bedtime.   spironolactone  25 MG tablet Commonly known as: ALDACTONE  Take 25 mg by mouth daily.   True Metrix Meter Devi 1 each by Does not apply route daily before breakfast.   ReliOn Prime Monitor Devi Use as directed once daily        Follow-up Information     Daye, Glinda DASEN, FNP. Schedule an appointment as soon as possible for a visit in 1 week(s).   Specialty: Family Medicine Contact information: [address] [place] [address] [phone]                 Discharge Exam: Fredricka Weights   08/16/23 1859  Weight: 124.7 kg   General.  Morbidly obese lady, in no acute distress. Pulmonary.  Lungs clear bilaterally, normal respiratory effort. CV.  Regular rate and rhythm, no JVD, rub or murmur. Abdomen.  Soft, nontender, nondistended, BS positive. CNS.  Alert and oriented .  No focal neurologic deficit. Extremities.  2+ bilateral LE edema, mild calf tenderness on right Psychiatry.  Judgment and insight appears normal.   Condition at discharge: stable  The results of significant diagnostics from this hospitalization (including imaging, microbiology, ancillary and laboratory) are listed below for reference.   Imaging Studies: CT Angio Chest/Abd/Pel for Dissection W and/or W/WO Result Date: 08/16/2023 CLINICAL DATA:  Acute aortic syndrome suspected, dissection EXAM: CT ANGIOGRAPHY CHEST, ABDOMEN AND PELVIS TECHNIQUE: Non-contrast CT of the chest was initially obtained. Multidetector CT imaging through the chest, abdomen and pelvis was performed using the standard protocol during bolus administration of  intravenous contrast. Multiplanar reconstructed images and MIPs were obtained and reviewed to evaluate the vascular anatomy. RADIATION DOSE REDUCTION: This exam was performed according to the departmental dose-optimization program which includes automated exposure control, adjustment of the mA and/or kV according to patient size and/or use of iterative reconstruction technique. CONTRAST:  OMNIPAQUE  IOHEXOL  350 MG/ML SOLN COMPARISON:  None Available. FINDINGS: CTA CHEST FINDINGS VASCULAR Aorta: Satisfactory opacification of the aorta. Normal contour and caliber of the thoracic aorta. No evidence of aneurysm, dissection, or other acute aortic pathology. Cardiovascular: Positive examination for pulmonary embolism, with nonocclusive lobar to segmental embolus present in the right lower lobe (series 11, image 77, 88). Mild cardiomegaly.  Gross enlargement of the main pulmonary artery measuring up to 4.4 cm in caliber. No pericardial effusion. Review of the MIP images confirms the above findings. NON VASCULAR Mediastinum/Nodes: No enlarged mediastinal, hilar, or axillary lymph nodes. Small hiatal hernia. Thyroid  gland, trachea, and esophagus demonstrate no significant findings. Lungs/Pleura: Lungs are clear. No pleural effusion or pneumothorax. Musculoskeletal: No chest wall abnormality. No acute osseous findings. Review of the MIP images confirms the above findings. CTA ABDOMEN AND PELVIS FINDINGS VASCULAR Normal contour and caliber of the abdominal aorta. No evidence of aneurysm, dissection, or other acute aortic pathology. Standard branching pattern of the abdominal aorta with solitary bilateral renal arteries. Review of the MIP images confirms the above findings. NON-VASCULAR Hepatobiliary: No solid liver abnormality is seen. Hepatic steatosis. No gallstones, gallbladder wall thickening, or biliary dilatation. Pancreas: Unremarkable. No pancreatic ductal dilatation or surrounding inflammatory changes. Spleen: Normal in size without significant abnormality. Adrenals/Urinary Tract: Adrenal glands are unremarkable. Kidneys are normal, without renal calculi, solid lesion, or hydronephrosis. Bladder is unremarkable. Stomach/Bowel: Stomach is within normal limits. Periampullary descending duodenal diverticulum. Appendix appears normal. No evidence of bowel wall thickening, distention, or inflammatory changes. Pancolonic diverticulosis. Lymphatic: No enlarged abdominal or pelvic lymph nodes. Reproductive: Status post hysterectomy. Other: No abdominal wall hernia or abnormality. No ascites. Musculoskeletal: No acute osseous findings. IMPRESSION: 1. Positive examination for pulmonary embolism, with nonocclusive lobar to segmental embolus present in the right lower lobe. 2. Normal contour and caliber of the thoracic and abdominal aorta. No evidence of  aneurysm, dissection, or other acute aortic pathology. No significant atherosclerosis. 3. Mild cardiomegaly. Gross enlargement of the main pulmonary artery measuring up to 4.4 cm in caliber, as can be seen in pulmonary hypertension. Note that this is likely incidental to pulmonary embolism given modest volume of nonocclusive embolus. 4. Hepatic steatosis. Critical findings reported by telephone to Dr. Mannie, 9:36 p.m., 08/16/2023 Electronically Signed   By: Marolyn JONETTA Jaksch M.D.   On: 08/16/2023 21:38   DG Chest 2 View Result Date: 08/16/2023 CLINICAL DATA:  Chest pain EXAM: CHEST - 2 VIEW COMPARISON:  Chest x-ray 02/20/2023 FINDINGS: The heart and mediastinal contours are unchanged. No focal consolidation. No pulmonary edema. No pleural effusion. No pneumothorax. No acute osseous abnormality. IMPRESSION: No active cardiopulmonary disease. Electronically Signed   By: Morgane  Naveau M.D.   On: 08/16/2023 19:30    Microbiology: Results for orders placed or performed during the hospital encounter of 11/02/20  Resp Panel by RT-PCR (Flu A&B, Covid) Nasopharyngeal Swab     Status: None   Collection Time: 11/02/20 10:35 AM   Specimen: Nasopharyngeal Swab; Nasopharyngeal(NP) swabs in vial transport medium  Result Value Ref Range Status   SARS Coronavirus 2 by RT PCR NEGATIVE NEGATIVE Final    Comment: (NOTE) SARS-CoV-2 target nucleic acids are NOT DETECTED.  The SARS-CoV-2 RNA is generally detectable in upper respiratory specimens during the acute phase of infection. The lowest concentration of SARS-CoV-2 viral copies this assay can detect is 138 copies/mL. A negative result does not preclude SARS-Cov-2 infection and should not be used as the sole basis for treatment or other patient management decisions. A negative result may occur with  improper specimen collection/handling, submission of specimen other than nasopharyngeal swab, presence of viral mutation(s) within the areas targeted by this assay,  and inadequate number of viral copies(<138 copies/mL). A negative result must be combined with clinical observations, patient history, and epidemiological information. The expected result is Negative.  Fact Sheet for Patients:  BloggerCourse.com  Fact Sheet for Healthcare Providers:  SeriousBroker.it  This test is no t yet approved or cleared by the [place]  FDA and  has been authorized for detection and/or diagnosis of SARS-CoV-2 by FDA under an Emergency Use Authorization (EUA). This EUA will remain  in effect (meaning this test can be used) for the duration of the COVID-19 declaration under Section 564(b)(1) of the Act, 21 U.S.C.section 360bbb-3(b)(1), unless the authorization is terminated  or revoked sooner.       Influenza A by PCR NEGATIVE NEGATIVE Final   Influenza B by PCR NEGATIVE NEGATIVE Final    Comment: (NOTE) The Xpert Xpress SARS-CoV-2/FLU/RSV plus assay is intended as an aid in the diagnosis of influenza from Nasopharyngeal swab specimens and should not be used as a sole basis for treatment. Nasal washings and aspirates are unacceptable for Xpert Xpress SARS-CoV-2/FLU/RSV testing.  Fact Sheet for Patients: BloggerCourse.com  Fact Sheet for Healthcare Providers: SeriousBroker.it  This test is not yet approved or cleared by the [place]  FDA and has been authorized for detection and/or diagnosis of SARS-CoV-2 by FDA under an Emergency Use Authorization (EUA). This EUA will remain in effect (meaning this test can be used) for the duration of the COVID-19 declaration under Section 564(b)(1) of the Act, 21 U.S.C. section 360bbb-3(b)(1), unless the authorization is terminated or revoked.  Performed at [facility], [address], [place], [place] [address]   Group A Strep by PCR     Status: None   Collection Time: 11/02/20  1:01 PM    Specimen: Throat; Sterile Swab  Result Value Ref Range Status   Group A Strep by PCR NOT DETECTED NOT DETECTED Final    Comment: Performed at [facility], [address], [place], [place] [address]    Labs: CBC: Recent Labs  Lab 08/16/23 1940 08/17/23 0635  WBC 6.7 6.4  HGB 11.4* 11.7*  HCT 37.2 38.2  MCV 95.4 95.3  PLT 261 258   Basic Metabolic Panel: Recent Labs  Lab 08/16/23 1940 08/17/23 0635  NA 135 138  K 4.0 3.9  CL 101 104  CO2 26 24  GLUCOSE 122* 159*  BUN 28* 18  CREATININE 1.35* 0.70  CALCIUM  9.6 9.1   Liver Function Tests: No results for input(s): AST, ALT, ALKPHOS, BILITOT, PROT, ALBUMIN in the last 168 hours. CBG: No results for input(s): GLUCAP in the last 168 hours.  Discharge time spent: greater than 30 minutes.  This record has been created using Conservation officer, historic buildings. Errors have been sought and corrected,but may not always be located. Such creation errors do not reflect on the standard of care.   Signed: Amaryllis Dare, MD [facility] 08/17/2023

## 2023-08-17 NOTE — Plan of Care (Signed)
  Problem: Education: Goal: Knowledge of General Education information will improve Description: Including pain rating scale, medication(s)/side effects and non-pharmacologic comfort measures Outcome: Progressing   Problem: Health Behavior/Discharge Planning: Goal: Ability to manage health-related needs will improve Outcome: Progressing   Problem: Clinical Measurements: Goal: Ability to maintain clinical measurements within normal limits will improve Outcome: Progressing Goal: Will remain free from infection Outcome: Progressing Goal: Diagnostic test results will improve Outcome: Progressing Goal: Respiratory complications will improve Outcome: Progressing Goal: Cardiovascular complication will be avoided Outcome: Progressing   Problem: Activity: Goal: Risk for activity intolerance will decrease Outcome: Progressing   Problem: Nutrition: Goal: Adequate nutrition will be maintained Outcome: Progressing   Problem: Pain Managment: Goal: General experience of comfort will improve and/or be controlled Outcome: Progressing   Problem: Safety: Goal: Ability to remain free from injury will improve Outcome: Progressing

## 2023-08-17 NOTE — Care Management Obs Status (Signed)
 MEDICARE OBSERVATION STATUS NOTIFICATION   Patient Details  Name: Tiffany Strickland MRN: [id number] Date of Birth: 11/27/1955   Medicare Observation Status Notification Given:  Yes    Sheri ONEIDA Sharps, LCSW 08/17/2023, 12:05 PM

## 2023-08-17 NOTE — Progress Notes (Addendum)
 PHARMACY - ANTICOAGULATION CONSULT NOTE  Pharmacy Consult for Heparin  Indication: pulmonary embolus  Allergies  Allergen Reactions   Pollen Extract Other (See Comments)    Seasonal allergies   Tomato Itching   Wool Alcohol [Lanolin] Itching    Reaction unknown    Patient Measurements: Height: 5' 5 (165.1 cm) Weight: 124.7 kg (275 lb) IBW/kg (Calculated) : 57 HEPARIN  DW (KG): 87.3  Vital Signs: Temp: 98.3 F (36.8 C) (07/06 0311) BP: 160/73 (07/06 0311) Pulse Rate: 82 (07/06 0311)  Labs: Recent Labs    08/16/23 1940 08/16/23 2140 08/16/23 2150  HGB 11.4*  --   --   HCT 37.2  --   --   PLT 261  --   --   APTT  --   --  30  LABPROT  --   --  13.2  INR  --   --  1.0  CREATININE 1.35*  --   --   TROPONINIHS 16 16  --     Estimated Creatinine Clearance: 52.2 mL/min (A) (by C-G formula based on SCr of 1.35 mg/dL (H)).   Medical History: Past Medical History:  Diagnosis Date   Colon polyp    adenomatous   Diabetes mellitus without complication (HCC)    Hyperlipidemia    Hypertension    Sleep apnea    Stroke (HCC)    Thyroid  disease     Medications:  Scheduled:   amLODipine   5 mg Oral Daily   glipiZIDE   10 mg Oral QAC breakfast   hydrochlorothiazide   25 mg Oral Daily   isosorbide  mononitrate  60 mg Oral Daily   levothyroxine   150 mcg Oral QAC breakfast   losartan   100 mg Oral Daily   rosuvastatin   10 mg Oral QHS   sodium chloride  flush  3 mL Intravenous Q12H   spironolactone   25 mg Oral Daily   Infusions:   sodium chloride  100 mL/hr at 08/17/23 0416   heparin  1,500 Units/hr (08/17/23 0416)    Assessment: 36 yoF admitted on 7/5 with back pain radiating to right side of her chest.  CTAngio + PE, suspect related to recent airline travel.  No prior anticoagulation.    Today, 08/17/2023: Heparin  level: 0.76, supratherapeutic on heparin  1500 units/hr CBC:  Hgb low/stable at 11.7, Plt 258 No bleeding or IV complications reported by RN  Goal of  Therapy:  Heparin  level 0.3-0.7 units/ml Monitor platelets by anticoagulation protocol: Yes   Plan:  Decrease to heparin  IV infusion at 1350 units/hr Heparin  level in 6 hours after rate change Daily heparin  level and CBC  Wanda Hasting PharmD, BCPS [facility] main pharmacy [phone] 08/17/2023 7:20 AM   Addendum: Pharmacy is consulted to transition to Xarelto . Stop heparin , at the same time, give the first dose of Xarelto  Xarelto  15mg  PO BID x21 days, then 20mg  PO once daily. Pharmacy to provide education prior to discharge.  Wanda Hasting PharmD, BCPS [facility] pharmacy [phone] 08/17/2023 9:35 AM

## 2023-08-17 NOTE — Discharge Instructions (Signed)
 Information on my medicine - XARELTO  (rivaroxaban ) This medication education was reviewed with me or my healthcare representative as part of my discharge preparation.   WHY WAS XARELTO  PRESCRIBED FOR YOU? Xarelto  was prescribed to treat blood clots that may have been found in the veins of your legs (deep vein thrombosis) or in your lungs (pulmonary embolism) and to reduce the risk of them occurring again.  What do you need to know about Xarelto ? The starting dose is one 15 mg tablet taken TWICE daily with food for the FIRST 21 DAYS then on (enter date)  09/07/23  the dose is changed to one 20 mg tablet taken ONCE A DAY with your evening meal.  DO NOT stop taking Xarelto  without talking to the health care provider who prescribed the medication.  Refill your prescription for 20 mg tablets before you run out.  After discharge, you should have regular check-up appointments with your healthcare provider that is prescribing your Xarelto .  In the future your dose may need to be changed if your kidney function changes by a significant amount.  What do you do if you miss a dose? If you are taking Xarelto  TWICE DAILY and you miss a dose, take it as soon as you remember. You may take two 15 mg tablets (total 30 mg) at the same time then resume your regularly scheduled 15 mg twice daily the next day.  If you are taking Xarelto  ONCE DAILY and you miss a dose, take it as soon as you remember on the same day then continue your regularly scheduled once daily regimen the next day. Do not take two doses of Xarelto  at the same time.   Important Safety Information Xarelto  is a blood thinner medicine that can cause bleeding. You should call your healthcare provider right away if you experience any of the following: Bleeding from an injury or your nose that does not stop. Unusual colored urine (red or dark brown) or unusual colored stools (red or black). Unusual bruising for unknown reasons. A serious  fall or if you hit your head (even if there is no bleeding).  Some medicines may interact with Xarelto  and might increase your risk of bleeding while on Xarelto . To help avoid this, consult your healthcare provider or pharmacist prior to using any new prescription or non-prescription medications, including herbals, vitamins, non-steroidal anti-inflammatory drugs (NSAIDs) and supplements.  This website has more information on Xarelto : www.xarelto .com.

## 2023-08-17 NOTE — Hospital Course (Addendum)
 Tiffany Strickland is a 68 y.o. female with medical history significant of hyperlipidemia, diabetes mellitus type 2, sleep apnea, prior stroke who presented.  Patient has a recent travel to [place].  On presentation blood pressure elevated at 170/69, labs with concern of AKI, creatinine at 1.35 with baseline less than 1. Chest x-ray was negative for any active disease but CTA chest shows a right-sided nonocclusive lobar to subsegmental embolus.  Also shows gross enlargement of main pulmonary artery consistent with pulmonary hypertension.  No concern of right heart strain.  Patient was initially started on heparin  infusion.  7/6: Vital stable on room air.  AKI resolved.  PESI score for PE at 69 points which is low risk-transitioning heparin  with Xarelto .  Patient also had chronic bilateral lower extremity edema with some calf tenderness on right, likely a DVT which is the source of this PE.  Lower extremity venous Doppler was not done as there will not change the management at this point.  Patient with no shortness of breath or chest pain today.  She is being discharged on Xarelto -likely will need at least 3 to 6 months of anticoagulation as this PE seems provoked with recent travel.  Patient also not very mobile at baseline.  We are continuing her home baby aspirin , she need to follow-up with her provider to discuss whether it can be discontinued or held while she is taking Xarelto .  She will continue the rest of her home medications and follow-up with her providers closely for further assistance.  HPI:  Tiffany Strickland is a 68 y.o. female with medical history significant of hyperlipidemia, diabetes mellitus type 2, sleep apnea, prior stroke who presents today after a trip to [place] where she stayed.  She says she was cramped in a plane.  She felt well thereafter on arrival however on Friday morning developed chest pain on the right side that is pleuritic in nature going from the front to the  back.  Evaluation in the emergency department revealed an unremarkable chest x-ray but a CTA Neri emboli.  She was also noted to have fairly significant pulmonary artery hypertension and an acute kidney injury.  For these reasons she was referred to hospital medicine.   Significant Events: Admitted 08/16/2023 acute PE   Admission Labs: WBC 6.7, HgB 11.4, plt 261 Na 135, K 4.0, BUN 28, Scr 1.35, glu 122  Admission Imaging Studies: CTPA Positive examination for pulmonary embolism, with nonocclusive lobar to segmental embolus present in the right lower lobe. 2. Normal contour and caliber of the thoracic and abdominal aorta. No evidence of aneurysm, dissection, or other acute aortic pathology. No significant atherosclerosis.  3. Mild cardiomegaly. Gross enlargement of the main pulmonary artery measuring up to 4.4 cm in caliber, as can be seen in pulmonary hypertension. Note that this is likely incidental to pulmonary embolism given modest volume of nonocclusive embolus. 4. Hepatic steatosis.  Significant Labs:   Significant Imaging Studies: 1.  Antibiotic Therapy: Anti-infectives (From admission, onward)    None       Procedures:   Consultants: GI

## 2023-08-18 ENCOUNTER — Telehealth (HOSPITAL_COMMUNITY): Payer: Self-pay | Admitting: Pharmacy Technician

## 2023-08-18 ENCOUNTER — Other Ambulatory Visit (HOSPITAL_COMMUNITY): Payer: Self-pay

## 2023-08-18 DIAGNOSIS — I272 Pulmonary hypertension, unspecified: Secondary | ICD-10-CM | POA: Diagnosis not present

## 2023-08-18 DIAGNOSIS — N179 Acute kidney failure, unspecified: Secondary | ICD-10-CM | POA: Diagnosis not present

## 2023-08-18 DIAGNOSIS — I2699 Other pulmonary embolism without acute cor pulmonale: Secondary | ICD-10-CM | POA: Diagnosis not present

## 2023-08-18 DIAGNOSIS — I2609 Other pulmonary embolism with acute cor pulmonale: Secondary | ICD-10-CM | POA: Diagnosis not present

## 2023-08-18 DIAGNOSIS — K625 Hemorrhage of anus and rectum: Secondary | ICD-10-CM

## 2023-08-18 DIAGNOSIS — I10 Essential (primary) hypertension: Secondary | ICD-10-CM

## 2023-08-18 LAB — CBC
HCT: 36.8 % (ref 36.0–46.0)
Hemoglobin: 11.6 g/dL — ABNORMAL LOW (ref 12.0–15.0)
MCH: 30.2 pg (ref 26.0–34.0)
MCHC: 31.5 g/dL (ref 30.0–36.0)
MCV: 95.8 fL (ref 80.0–100.0)
Platelets: 246 K/uL (ref 150–400)
RBC: 3.84 MIL/uL — ABNORMAL LOW (ref 3.87–5.11)
RDW: 13.9 % (ref 11.5–15.5)
WBC: 5.2 K/uL (ref 4.0–10.5)
nRBC: 0 % (ref 0.0–0.2)

## 2023-08-18 MED ORDER — POLYETHYLENE GLYCOL 3350 17 GM/SCOOP PO POWD: 8.0000 g | Freq: Every day | ORAL | Status: AC

## 2023-08-18 MED ORDER — LEVOTHYROXINE SODIUM 150 MCG PO TABS: 150.0000 ug | ORAL_TABLET | ORAL | Status: DC

## 2023-08-18 MED ORDER — HYDROCORTISONE ACETATE 25 MG RE SUPP
25.0000 mg | Freq: Two times a day (BID) | RECTAL | 0 refills | Status: DC
  Filled 2023-08-18: qty 12, 6d supply, fill #0

## 2023-08-18 MED ORDER — RIVAROXABAN (XARELTO) VTE STARTER PACK (15 & 20 MG)
ORAL_TABLET | ORAL | 0 refills | Status: AC
  Filled 2023-08-18: qty 51, 30d supply, fill #0

## 2023-08-18 MED ORDER — HYDROCORTISONE ACETATE 25 MG RE SUPP: 25.0000 mg | Freq: Two times a day (BID) | RECTAL | 0 refills | Status: AC

## 2023-08-18 MED ORDER — DOCUSATE SODIUM 100 MG PO CAPS: 100.0000 mg | ORAL_CAPSULE | Freq: Two times a day (BID) | ORAL | Status: DC

## 2023-08-18 MED ORDER — LEVOTHYROXINE SODIUM 75 MCG PO TABS: 75.0000 ug | ORAL_TABLET | ORAL | Status: DC

## 2023-08-18 MED ORDER — POLYETHYLENE GLYCOL 3350 17 GM/SCOOP PO POWD: 8.0000 g | Freq: Every day | ORAL | Status: DC

## 2023-08-18 MED ORDER — DOCUSATE SODIUM 100 MG PO CAPS: 100.0000 mg | ORAL_CAPSULE | Freq: Two times a day (BID) | ORAL | Status: AC

## 2023-08-18 NOTE — Subjective & Objective (Signed)
 Pt seen and examined. GI has been pt and deemed pt stable for DC to home.

## 2023-08-18 NOTE — Assessment & Plan Note (Signed)
 08-18-2023 pt with known constipation. Pt need to take miralax  and colace every day or even twice a day to achieve stool consistency similar to soft-serve ice cream. Prn anusol  to keep rectal inflammation down.

## 2023-08-18 NOTE — Assessment & Plan Note (Signed)
 08-18-2023 continue glipizide  at home.

## 2023-08-18 NOTE — Progress Notes (Signed)
 PROGRESS NOTE    Tiffany Strickland  FMW:[id number] DOB: [birth year] DOA: 08/16/2023 PCP: Rik Glinda DASEN, FNP  Subjective: Pt seen and examined. GI has been pt and deemed pt stable for DC to home.   Hospital Course: Tiffany Strickland is a 68 y.o. female with medical history significant of hyperlipidemia, diabetes mellitus type 2, sleep apnea, prior stroke who presented.  Patient has a recent travel to [place].  On presentation blood pressure elevated at 170/69, labs with concern of AKI, creatinine at 1.35 with baseline less than 1. Chest x-ray was negative for any active disease but CTA chest shows a right-sided nonocclusive lobar to subsegmental embolus.  Also shows gross enlargement of main pulmonary artery consistent with pulmonary hypertension.  No concern of right heart strain.  Patient was initially started on heparin  infusion.  7/6: Vital stable on room air.  AKI resolved.  PESI score for PE at 69 points which is low risk-transitioning heparin  with Xarelto .  Patient also had chronic bilateral lower extremity edema with some calf tenderness on right, likely a DVT which is the source of this PE.  Lower extremity venous Doppler was not done as there will not change the management at this point.  Patient with no shortness of breath or chest pain today.  She is being discharged on Xarelto -likely will need at least 3 to 6 months of anticoagulation as this PE seems provoked with recent travel.  Patient also not very mobile at baseline.  We are continuing her home baby aspirin , she need to follow-up with her provider to discuss whether it can be discontinued or held while she is taking Xarelto .  She will continue the rest of her home medications and follow-up with her providers closely for further assistance.  HPI:  Tiffany Strickland is a 68 y.o. female with medical history significant of hyperlipidemia, diabetes mellitus type 2, sleep apnea, prior stroke who presents today after a trip to [place] where she stayed.  She says she was cramped in a plane.  She felt well thereafter on arrival however on Friday morning developed chest pain on the right side that is pleuritic in nature going from the front to the back.  Evaluation in the emergency department revealed an unremarkable chest x-ray but a CTA Neri emboli.  She was also noted to have fairly significant pulmonary artery hypertension and an acute kidney injury.  For these reasons she was referred to hospital medicine.   Significant Events: Admitted 08/16/2023 acute PE   Admission Labs: WBC 6.7, HgB 11.4, plt 261 Na 135, K 4.0, BUN 28, Scr 1.35, glu 122  Admission Imaging Studies: CTPA Positive examination for pulmonary embolism, with nonocclusive lobar to segmental embolus present in the right lower lobe. 2. Normal contour and caliber of the thoracic and abdominal aorta. No evidence of aneurysm, dissection, or other acute aortic pathology. No significant atherosclerosis.  3. Mild cardiomegaly. Gross enlargement of the main pulmonary artery measuring up to 4.4 cm in caliber, as can be seen in pulmonary hypertension. Note that this is likely incidental to pulmonary embolism given modest volume of nonocclusive embolus. 4. Hepatic steatosis.  Significant Labs:   Significant Imaging Studies: 1.  Antibiotic Therapy: Anti-infectives (From admission, onward)    None       Procedures:   Consultants: GI    Assessment and Plan: * Acute pulmonary embolism (HCC) 08-17-2023 Patient found to have pulmonary embolism based on air travel in a cramped airline seat and CTA of the chest abnormality  accounting for her right-sided chest pain which was pleuritic in nature. -Admit to hospital in inpatient setting -Begin heparin  drip -Monitor response to treatment -Convert to oral anticoagulation 24 to 48 hours  08-18-2023 continue with Xarelto  for 6 months. First episode of VTE.  Will need to see PCP in 1 week for refills. Hold ASA  while taking Xarelto .  Pulmonary hypertension (HCC) 08-17-2023 Patient with long pulmonary hypertension noted initially on CT scan in 2018 as evidenced by pulmonary artery. -Monitor closely -Conservative care -Consider pulmonary consultation as out patient  08-18-2023 f/u with PCP for pulmonology referral  Acute kidney injury [facility]) 08-17-2023 Patient with change in her creatinine from 0.80 to 1.35 in 5 months - Will hydrate patient for now -Recheck BUN and creatinine in a.m. -Avoid nephrotoxic agents  08-18-2023 resolved with IVF. Scr 0.7 at discharge.  Essential hypertension 08-17-2023 In the setting of known hypertension.  Currently with systolic blood pressure 160.  Has apparently missed some of her blood pressure medications today.  Will order her medications and monitor  08-18-2023 stable.  Constipation 08-18-2023 pt with known constipation. Pt need to take miralax  and colace every day or even twice a day to achieve stool consistency similar to soft-serve ice cream. Prn anusol  to keep rectal inflammation down.  Obesity, Class III, BMI 40-49.9 (morbid obesity) Body mass index is 45.76 kg/m.   Non-insulin  treated type 2 diabetes mellitus (HCC) 08-18-2023 continue glipizide  at home.  DVT prophylaxis:   Xarelto    Code Status: Full Code Family Communication: no family at bedside. She is decisional. Disposition Plan: return home Reason for continuing need for hospitalization: stable for Dc.  Objective: Vitals:   08/17/23 2047 08/18/23 0535 08/18/23 1020 08/18/23 1235  BP: (!) 131/59 (!) 147/70 (!) 141/55 (!) 117/56  Pulse: 71 64  67  Resp: 20 18  (!) 21  Temp: 97.8 F (36.6 C) 98.5 F (36.9 C)  97.9 F (36.6 C)  TempSrc:      SpO2: 99% 97%  96%  Weight:      Height:       No intake or output data in the 24 hours ending 08/18/23 1453 Filed Weights   08/16/23 1859  Weight: 124.7 kg    Examination:  Physical Exam Vitals and nursing note reviewed.   Constitutional:      Appearance: She is obese.  HENT:     Head: Normocephalic and atraumatic.     Nose: Nose normal.  Eyes:     General: No scleral icterus. Cardiovascular:     Rate and Rhythm: Normal rate.     Pulses: Normal pulses.  Pulmonary:     Effort: Pulmonary effort is normal. No respiratory distress.  Abdominal:     General: Bowel sounds are normal. There is no distension.     Palpations: Abdomen is soft.  Skin:    General: Skin is warm and dry.     Capillary Refill: Capillary refill takes less than 2 seconds.  Neurological:     Mental Status: She is alert and oriented to person, place, and time.    Data Reviewed: I have personally reviewed following labs and imaging studies  CBC: Recent Labs  Lab 08/16/23 1940 08/17/23 0635 08/17/23 1318 08/18/23 0522  WBC 6.7 6.4 5.5 5.2  HGB 11.4* 11.7* 12.3 11.6*  HCT 37.2 38.2 39.1 36.8  MCV 95.4 95.3 94.4 95.8  PLT 261 258 251 246   Basic Metabolic Panel: Recent Labs  Lab 08/16/23 1940 08/17/23 0635  NA  135 138  K 4.0 3.9  CL 101 104  CO2 26 24  GLUCOSE 122* 159*  BUN 28* 18  CREATININE 1.35* 0.70  CALCIUM  9.6 9.1   GFR: Estimated Creatinine Clearance: 88.1 mL/min (by C-G formula based on SCr of 0.7 mg/dL). Coagulation Profile: Recent Labs  Lab 08/16/23 2150  INR 1.0   BNP (last 3 results) Recent Labs    02/20/23 1729  BNP 32.0   Radiology Studies: CT Angio Chest/Abd/Pel for Dissection W and/or W/WO Result Date: 08/16/2023 CLINICAL DATA:  Acute aortic syndrome suspected, dissection EXAM: CT ANGIOGRAPHY CHEST, ABDOMEN AND PELVIS TECHNIQUE: Non-contrast CT of the chest was initially obtained. Multidetector CT imaging through the chest, abdomen and pelvis was performed using the standard protocol during bolus administration of intravenous contrast. Multiplanar reconstructed images and MIPs were obtained and reviewed to evaluate the vascular anatomy. RADIATION DOSE REDUCTION: This exam was performed  according to the departmental dose-optimization program which includes automated exposure control, adjustment of the mA and/or kV according to patient size and/or use of iterative reconstruction technique. CONTRAST:  OMNIPAQUE  IOHEXOL  350 MG/ML SOLN COMPARISON:  None Available. FINDINGS: CTA CHEST FINDINGS VASCULAR Aorta: Satisfactory opacification of the aorta. Normal contour and caliber of the thoracic aorta. No evidence of aneurysm, dissection, or other acute aortic pathology. Cardiovascular: Positive examination for pulmonary embolism, with nonocclusive lobar to segmental embolus present in the right lower lobe (series 11, image 77, 88). Mild cardiomegaly. Gross enlargement of the main pulmonary artery measuring up to 4.4 cm in caliber. No pericardial effusion. Review of the MIP images confirms the above findings. NON VASCULAR Mediastinum/Nodes: No enlarged mediastinal, hilar, or axillary lymph nodes. Small hiatal hernia. Thyroid  gland, trachea, and esophagus demonstrate no significant findings. Lungs/Pleura: Lungs are clear. No pleural effusion or pneumothorax. Musculoskeletal: No chest wall abnormality. No acute osseous findings. Review of the MIP images confirms the above findings. CTA ABDOMEN AND PELVIS FINDINGS VASCULAR Normal contour and caliber of the abdominal aorta. No evidence of aneurysm, dissection, or other acute aortic pathology. Standard branching pattern of the abdominal aorta with solitary bilateral renal arteries. Review of the MIP images confirms the above findings. NON-VASCULAR Hepatobiliary: No solid liver abnormality is seen. Hepatic steatosis. No gallstones, gallbladder wall thickening, or biliary dilatation. Pancreas: Unremarkable. No pancreatic ductal dilatation or surrounding inflammatory changes. Spleen: Normal in size without significant abnormality. Adrenals/Urinary Tract: Adrenal glands are unremarkable. Kidneys are normal, without renal calculi, solid lesion, or  hydronephrosis. Bladder is unremarkable. Stomach/Bowel: Stomach is within normal limits. Periampullary descending duodenal diverticulum. Appendix appears normal. No evidence of bowel wall thickening, distention, or inflammatory changes. Pancolonic diverticulosis. Lymphatic: No enlarged abdominal or pelvic lymph nodes. Reproductive: Status post hysterectomy. Other: No abdominal wall hernia or abnormality. No ascites. Musculoskeletal: No acute osseous findings. IMPRESSION: 1. Positive examination for pulmonary embolism, with nonocclusive lobar to segmental embolus present in the right lower lobe. 2. Normal contour and caliber of the thoracic and abdominal aorta. No evidence of aneurysm, dissection, or other acute aortic pathology. No significant atherosclerosis. 3. Mild cardiomegaly. Gross enlargement of the main pulmonary artery measuring up to 4.4 cm in caliber, as can be seen in pulmonary hypertension. Note that this is likely incidental to pulmonary embolism given modest volume of nonocclusive embolus. 4. Hepatic steatosis. Critical findings reported by telephone to Dr. Mannie, 9:36 p.m., 08/16/2023 Electronically Signed   By: Marolyn JONETTA Jaksch M.D.   On: 08/16/2023 21:38   DG Chest 2 View Result Date: 08/16/2023 CLINICAL DATA:  Chest pain  EXAM: CHEST - 2 VIEW COMPARISON:  Chest x-ray 02/20/2023 FINDINGS: The heart and mediastinal contours are unchanged. No focal consolidation. No pulmonary edema. No pleural effusion. No pneumothorax. No acute osseous abnormality. IMPRESSION: No active cardiopulmonary disease. Electronically Signed   By: Morgane  Naveau M.D.   On: 08/16/2023 19:30    Scheduled Meds:  amLODipine   5 mg Oral Daily   glipiZIDE   10 mg Oral QAC breakfast   hydrochlorothiazide   25 mg Oral Daily   isosorbide  mononitrate  60 mg Oral Daily   [START ON 08/19/2023] levothyroxine   150 mcg Oral Once per day on Sunday Tuesday Thursday Friday Saturday   [START ON 08/20/2023] levothyroxine   75 mcg Oral Once  per day on Monday Wednesday   losartan   100 mg Oral Daily   rosuvastatin   10 mg Oral QHS   sodium chloride  flush  3 mL Intravenous Q12H   spironolactone   25 mg Oral Daily   Continuous Infusions:   LOS: 1 day   Time spent: 55 minutes  Camellia Door, DO  [facility]  08/18/2023, 2:53 PM

## 2023-08-18 NOTE — Discharge Summary (Addendum)
 [facility] Physician Discharge Summary   Patient name: Tiffany Strickland  Admit date:     08/16/2023  Discharge date: 08/18/2023  Attending Physician: JACKEE ZEBEDEE BROCKS [[id number]]  Discharge Physician: Camellia Door   PCP: Daye, Deneda T, FNP  Admitted From: Home  Disposition:  Home  Recommendations for Outpatient Follow-up:  Follow up with PCP in 1-2 weeks Please follow up on the following pending results: pt with positive HIV antibody, confirmation test has not yet resulted. In order to not cause undue anxiety about her + HIV antibody test, I did not tell her about the positive HIV antibody test.  If pt's confirmation HIV test is positive, pt will need referral to ID clinic from treatment.  Home Health:No Equipment/Devices: None    Discharge Condition:Stable CODE STATUS:FULL Diet recommendation: Heart Healthy/Diabetic Fluid Restriction: None  Hospital Summary: Tiffany Strickland is a 68 y.o. female with medical history significant of hyperlipidemia, diabetes mellitus type 2, sleep apnea, prior stroke who presented.  Patient has a recent travel to [place].  On presentation blood pressure elevated at 170/69, labs with concern of AKI, creatinine at 1.35 with baseline less than 1. Chest x-ray was negative for any active disease but CTA chest shows a right-sided nonocclusive lobar to subsegmental embolus.  Also shows gross enlargement of main pulmonary artery consistent with pulmonary hypertension.  No concern of right heart strain.  Patient was initially started on heparin  infusion.  7/6: Vital stable on room air.  AKI resolved.  PESI score for PE at 69 points which is low risk-transitioning heparin  with Xarelto .  Patient also had chronic bilateral lower extremity edema with some calf tenderness on right, likely a DVT which is the source of this PE.  Lower extremity venous Doppler was not done as there will not change the management at this point.  Patient with no shortness of  breath or chest pain today.  She is being discharged on Xarelto -likely will need at least 3 to 6 months of anticoagulation as this PE seems provoked with recent travel.  Patient also not very mobile at baseline.  We are continuing her home baby aspirin , she need to follow-up with her provider to discuss whether it can be discontinued or held while she is taking Xarelto .  She will continue the rest of her home medications and follow-up with her providers closely for further assistance.  HPI:  Tiffany Strickland is a 68 y.o. female with medical history significant of hyperlipidemia, diabetes mellitus type 2, sleep apnea, prior stroke who presents today after a trip to [place] where she stayed.  She says she was cramped in a plane.  She felt well thereafter on arrival however on Friday morning developed chest pain on the right side that is pleuritic in nature going from the front to the back.  Evaluation in the emergency department revealed an unremarkable chest x-ray but a CTA Neri emboli.  She was also noted to have fairly significant pulmonary artery hypertension and an acute kidney injury.  For these reasons she was referred to hospital medicine.   Significant Events: Admitted 08/16/2023 acute PE   Admission Labs: WBC 6.7, HgB 11.4, plt 261 Na 135, K 4.0, BUN 28, Scr 1.35, glu 122  Admission Imaging Studies: CTPA Positive examination for pulmonary embolism, with nonocclusive lobar to segmental embolus present in the right lower lobe. 2. Normal contour and caliber of the thoracic and abdominal aorta. No evidence of aneurysm, dissection, or other acute aortic pathology. No significant atherosclerosis.  3.  Mild cardiomegaly. Gross enlargement of the main pulmonary artery measuring up to 4.4 cm in caliber, as can be seen in pulmonary hypertension. Note that this is likely incidental to pulmonary embolism given modest volume of nonocclusive embolus. 4. Hepatic steatosis.  Significant  Labs:   Significant Imaging Studies: 1.  Antibiotic Therapy: Anti-infectives (From admission, onward)    None       Procedures:   Consultants: GI   Hospital Course by Problem: * Acute pulmonary embolism (HCC) 08-17-2023 Patient found to have pulmonary embolism based on air travel in a cramped airline seat and CTA of the chest abnormality accounting for her right-sided chest pain which was pleuritic in nature. -Admit to hospital in inpatient setting -Begin heparin  drip -Monitor response to treatment -Convert to oral anticoagulation 24 to 48 hours  08-18-2023 continue with Xarelto  for 6 months. First episode of VTE.  Will need to see PCP in 1 week for refills. Hold ASA while taking Xarelto .  Pulmonary hypertension (HCC) 08-17-2023 Patient with long pulmonary hypertension noted initially on CT scan in 2018 as evidenced by pulmonary artery. -Monitor closely -Conservative care -Consider pulmonary consultation as out patient  08-18-2023 f/u with PCP for pulmonology referral  Acute kidney injury [facility]) 08-17-2023 Patient with change in her creatinine from 0.80 to 1.35 in 5 months - Will hydrate patient for now -Recheck BUN and creatinine in a.m. -Avoid nephrotoxic agents  08-18-2023 resolved with IVF. Scr 0.7 at discharge.  Essential hypertension 08-17-2023 In the setting of known hypertension.  Currently with systolic blood pressure 160.  Has apparently missed some of her blood pressure medications today.  Will order her medications and monitor  08-18-2023 stable.  Constipation 08-18-2023 pt with known constipation. Pt need to take miralax  and colace every day or even twice a day to achieve stool consistency similar to soft-serve ice cream. Prn anusol  to keep rectal inflammation down.  Obesity, Class III, BMI 40-49.9 (morbid obesity) Body mass index is 45.76 kg/m.   Non-insulin  treated type 2 diabetes mellitus (HCC) 08-18-2023 continue glipizide  at  home.    Discharge Diagnoses:  Principal Problem:   Acute pulmonary embolism (HCC) Active Problems:   Rectal bleeding   Pulmonary hypertension (HCC)   Constipation   Essential hypertension   Acute kidney injury (HCC)   Non-insulin  treated type 2 diabetes mellitus (HCC)   Obesity, Class III, BMI 40-49.9 (morbid obesity)   Discharge Instructions  Discharge Instructions     Diet - low sodium heart healthy   Complete by: As directed    Discharge instructions   Complete by: As directed    It was a pleasure taking care of you. You have been started on a blood thinner called Xarelto  for your PE and DVT.  Please start with the starter pack, you will take 15 mg twice daily for first 3 weeks followed by daily 15 mg.  You will likely need at least 3 to 56-month of therapy.  Please be mindful it increase the risk of bleeding as you are also taking baby aspirin . You can discuss with your provider to see if baby aspirin  can be held while you are taking Xarelto . If you experience any significant abnormal bleeding please seek medical attention. Please keep yourself well-hydrated and keep moving your legs while traveling to avoid stagnation. Continue taking your home medications as you are doing it before and follow-up with your doctor closely for further management.   Increase activity slowly   Complete by: As directed  Allergies as of 08/18/2023       Reactions   Pollen Extract Other (See Comments)   Seasonal allergies   Tomato Itching   Wool Alcohol [lanolin] Itching   Reaction unknown        Medication List     PAUSE taking these medications    aspirin  EC 81 MG tablet Wait to take this until: February 18, 2024 Hold Aspiring for 6 months or until you stop taking Xarelto  Commonly known as: Aspirin  81 Take 1 tablet (81 mg total) by mouth daily. Swallow whole.       TAKE these medications    Accu-Chek FastClix Lancets Misc SMARTSIG:1 Topical Daily   amLODipine  5 MG  tablet Commonly known as: NORVASC  Take 5 mg by mouth daily.   Azelastine HCl 137 MCG/SPRAY Soln Place 1 spray into both nostrils 2 (two) times daily as needed.   docusate sodium  100 MG capsule Commonly known as: Colace Take 1 capsule (100 mg total) by mouth 2 (two) times daily.   glipiZIDE  10 MG 24 hr tablet Commonly known as: GLUCOTROL  XL Take 10 mg by mouth daily.   hydrochlorothiazide  25 MG tablet Commonly known as: HYDRODIURIL  Take 1 tablet (25 mg total) by mouth daily.   hydrocortisone  25 MG suppository Commonly known as: ANUSOL -HC Place 1 suppository (25 mg total) rectally every 12 (twelve) hours for 6 days.   isosorbide  mononitrate 60 MG 24 hr tablet Commonly known as: IMDUR  Take 60 mg by mouth daily.   levothyroxine  150 MCG tablet Commonly known as: Euthyrox  Take 1 tablet (150 mcg total) by mouth daily before breakfast. Must have office visit for refills. What changed:  how much to take when to take this additional instructions   losartan  100 MG tablet Commonly known as: COZAAR  Take 1 tablet (100 mg total) by mouth daily.   MULTIVITAMIN PO Take 1 tablet by mouth daily.   nystatin  powder Commonly known as: MYCOSTATIN /NYSTOP  Apply 1 Application topically daily as needed.   omeprazole  20 MG capsule Commonly known as: PRILOSEC Take 20 mg by mouth daily as needed.   polyethylene glycol powder 17 GM/SCOOP powder Commonly known as: MiraLax  Take 8-17 g by mouth daily for 30 doses.   ReliOn Prime Test test strip Generic drug: glucose blood Use as instructed once daily   Rivaroxaban  Starter Pack (15 mg and 20 mg) Commonly known as: XARELTO  STARTER PACK Follow package directions: Take one 15mg  tablet by mouth twice a day. On day 22, switch to one 20mg  tablet once a day. Take with food.   rosuvastatin  10 MG tablet Commonly known as: CRESTOR  Take 10 mg by mouth at bedtime.   spironolactone  25 MG tablet Commonly known as: ALDACTONE  Take 25 mg by mouth  daily.   True Metrix Meter Devi 1 each by Does not apply route daily before breakfast.   ReliOn Prime Monitor Devi Use as directed once daily        Follow-up Information     Daye, Glinda DASEN, FNP. Schedule an appointment as soon as possible for a visit in 1 week(s).   Specialty: Family Medicine Contact information: [address] [place] [address] [phone]                Allergies  Allergen Reactions   Pollen Extract Other (See Comments)    Seasonal allergies   Tomato Itching   Wool Alcohol [Lanolin] Itching    Reaction unknown    Discharge Exam: Vitals:   08/18/23 1020 08/18/23  1235  BP: (!) 141/55 (!) 117/56  Pulse:  67  Resp:  (!) 21  Temp:  97.9 F (36.6 C)  SpO2:  96%    Physical Exam Vitals and nursing note reviewed.  Constitutional:      Appearance: She is obese.  HENT:     Head: Normocephalic and atraumatic.     Nose: Nose normal.  Eyes:     General: No scleral icterus. Cardiovascular:     Rate and Rhythm: Normal rate.     Pulses: Normal pulses.  Pulmonary:     Effort: Pulmonary effort is normal. No respiratory distress.  Abdominal:     General: Bowel sounds are normal. There is no distension.     Palpations: Abdomen is soft.  Skin:    General: Skin is warm and dry.     Capillary Refill: Capillary refill takes less than 2 seconds.  Neurological:     Mental Status: She is alert and oriented to person, place, and time.     The results of significant diagnostics from this hospitalization (including imaging, microbiology, ancillary and laboratory) are listed below for reference.    Labs: BNP (last 3 results) Recent Labs    02/20/23 1729  BNP 32.0   Basic Metabolic Panel: Recent Labs  Lab 08/16/23 1940 08/17/23 0635  NA 135 138  K 4.0 3.9  CL 101 104  CO2 26 24  GLUCOSE 122* 159*  BUN 28* 18  CREATININE 1.35* 0.70  CALCIUM  9.6 9.1   CBC: Recent Labs  Lab 08/16/23 1940 08/17/23 0635 08/17/23 1318  08/18/23 0522  WBC 6.7 6.4 5.5 5.2  HGB 11.4* 11.7* 12.3 11.6*  HCT 37.2 38.2 39.1 36.8  MCV 95.4 95.3 94.4 95.8  PLT 261 258 251 246   Sepsis Labs Recent Labs  Lab 08/16/23 1940 08/17/23 0635 08/17/23 1318 08/18/23 0522  WBC 6.7 6.4 5.5 5.2    Procedures/Studies: CT Angio Chest/Abd/Pel for Dissection W and/or W/WO Result Date: 08/16/2023 CLINICAL DATA:  Acute aortic syndrome suspected, dissection EXAM: CT ANGIOGRAPHY CHEST, ABDOMEN AND PELVIS TECHNIQUE: Non-contrast CT of the chest was initially obtained. Multidetector CT imaging through the chest, abdomen and pelvis was performed using the standard protocol during bolus administration of intravenous contrast. Multiplanar reconstructed images and MIPs were obtained and reviewed to evaluate the vascular anatomy. RADIATION DOSE REDUCTION: This exam was performed according to the departmental dose-optimization program which includes automated exposure control, adjustment of the mA and/or kV according to patient size and/or use of iterative reconstruction technique. CONTRAST:  OMNIPAQUE  IOHEXOL  350 MG/ML SOLN COMPARISON:  None Available. FINDINGS: CTA CHEST FINDINGS VASCULAR Aorta: Satisfactory opacification of the aorta. Normal contour and caliber of the thoracic aorta. No evidence of aneurysm, dissection, or other acute aortic pathology. Cardiovascular: Positive examination for pulmonary embolism, with nonocclusive lobar to segmental embolus present in the right lower lobe (series 11, image 77, 88). Mild cardiomegaly. Gross enlargement of the main pulmonary artery measuring up to 4.4 cm in caliber. No pericardial effusion. Review of the MIP images confirms the above findings. NON VASCULAR Mediastinum/Nodes: No enlarged mediastinal, hilar, or axillary lymph nodes. Small hiatal hernia. Thyroid  gland, trachea, and esophagus demonstrate no significant findings. Lungs/Pleura: Lungs are clear. No pleural effusion or pneumothorax. Musculoskeletal:  No chest wall abnormality. No acute osseous findings. Review of the MIP images confirms the above findings. CTA ABDOMEN AND PELVIS FINDINGS VASCULAR Normal contour and caliber of the abdominal aorta. No evidence of aneurysm, dissection, or other acute aortic pathology.  Standard branching pattern of the abdominal aorta with solitary bilateral renal arteries. Review of the MIP images confirms the above findings. NON-VASCULAR Hepatobiliary: No solid liver abnormality is seen. Hepatic steatosis. No gallstones, gallbladder wall thickening, or biliary dilatation. Pancreas: Unremarkable. No pancreatic ductal dilatation or surrounding inflammatory changes. Spleen: Normal in size without significant abnormality. Adrenals/Urinary Tract: Adrenal glands are unremarkable. Kidneys are normal, without renal calculi, solid lesion, or hydronephrosis. Bladder is unremarkable. Stomach/Bowel: Stomach is within normal limits. Periampullary descending duodenal diverticulum. Appendix appears normal. No evidence of bowel wall thickening, distention, or inflammatory changes. Pancolonic diverticulosis. Lymphatic: No enlarged abdominal or pelvic lymph nodes. Reproductive: Status post hysterectomy. Other: No abdominal wall hernia or abnormality. No ascites. Musculoskeletal: No acute osseous findings. IMPRESSION: 1. Positive examination for pulmonary embolism, with nonocclusive lobar to segmental embolus present in the right lower lobe. 2. Normal contour and caliber of the thoracic and abdominal aorta. No evidence of aneurysm, dissection, or other acute aortic pathology. No significant atherosclerosis. 3. Mild cardiomegaly. Gross enlargement of the main pulmonary artery measuring up to 4.4 cm in caliber, as can be seen in pulmonary hypertension. Note that this is likely incidental to pulmonary embolism given modest volume of nonocclusive embolus. 4. Hepatic steatosis. Critical findings reported by telephone to Dr. Mannie, 9:36 p.m., 08/16/2023  Electronically Signed   By: Marolyn JONETTA Jaksch M.D.   On: 08/16/2023 21:38   DG Chest 2 View Result Date: 08/16/2023 CLINICAL DATA:  Chest pain EXAM: CHEST - 2 VIEW COMPARISON:  Chest x-ray 02/20/2023 FINDINGS: The heart and mediastinal contours are unchanged. No focal consolidation. No pulmonary edema. No pleural effusion. No pneumothorax. No acute osseous abnormality. IMPRESSION: No active cardiopulmonary disease. Electronically Signed   By: Morgane  Naveau M.D.   On: 08/16/2023 19:30    Time coordinating discharge: 55 mins  SIGNED:  Camellia Door, DO [facility] 08/18/23, 3:44 PM

## 2023-08-18 NOTE — Assessment & Plan Note (Signed)
Body mass index is 45.76 kg/m?. ? ?

## 2023-08-18 NOTE — Telephone Encounter (Signed)
 Patient Product/process development scientist completed.    The patient is insured through [facility] Patient has Medicare and is not eligible for a copay card, but may be able to apply for patient assistance or Medicare RX Payment Plan (Patient Must reach out to their plan, if eligible for payment plan), if available.    Ran test claim for Xarelto 20 mg and the current 30 day co-pay is $0.00.   This test claim was processed through [facility]- copay amounts may vary at other pharmacies due to pharmacy/plan contracts, or as the patient moves through the different stages of their insurance plan.     Reyes Sharps, CPHT Pharmacy Technician III Certified Patient Advocate [facility] Patient Advocate Team Direct Number: [phone]  Fax: [phone]

## 2023-08-18 NOTE — Plan of Care (Signed)

## 2023-08-18 NOTE — Consult Note (Signed)
 Reason for Consult: Hematochezia Referring Physician: [facility]  Bobbette Slocumb HPI: This is a 68 year old female with a PMH of DM, hyperlipidemia, OSA, and CVA admitted for a right lobe PE.  The patient was on a flight back from [place] and she complained about chest pain with radiation to the back.  The CTA confirmed the PE and she was admitted from treatment.  An incidental finding was the pulmonary HTN.  The patient was placed in heparin  and then transitioned the Xarelto .  The patient reported some minor hematochezia, but she reports that the hematochezia was not any different from her baseline hemorrhoidal bleeding.  It was not worse with the anticoagulation.  Her alst colonoscopy was on 05/05/2020 and it was positive for two small polyps, which were an adenoma and an SSP.  Currently she is reporting that she feels well.  Past Medical History:  Diagnosis Date   Colon polyp    adenomatous   Diabetes mellitus without complication (HCC)    Hyperlipidemia    Hypertension    Sleep apnea    Stroke (HCC)    Thyroid  disease     Past Surgical History:  Procedure Laterality Date   ABDOMINAL HYSTERECTOMY     BLADDER SUSPENSION     COLONOSCOPY WITH PROPOFOL  N/A 05/05/2020   Procedure: COLONOSCOPY WITH PROPOFOL ;  Surgeon: Rollin Dover, MD;  Location: [facility];  Service: Endoscopy;  Laterality: N/A;   EUS N/A 08/12/2013   Procedure: LOWER ENDOSCOPIC ULTRASOUND (EUS);  Surgeon: Toribio SHAUNNA Cedar, MD;  Location: [facility];  Service: Endoscopy;  Laterality: N/A;   KNEE SURGERY     POLYPECTOMY  05/05/2020   Procedure: POLYPECTOMY;  Surgeon: Rollin Dover, MD;  Location: [facility];  Service: Endoscopy;;   TUBAL LIGATION      Family History  Problem Relation Age of Onset   Hypertension Mother    Heart disease Maternal Grandmother    Cancer Maternal Grandmother        ovarian   Cancer Maternal Grandfather    Hypertension Other    Hyperlipidemia Other    Cancer Other     Sleep apnea Other    Obesity Other     Social History:  reports that she has never smoked. She has never used smokeless tobacco. She reports that she does not drink alcohol and does not use drugs.  Allergies:  Allergies  Allergen Reactions   Pollen Extract Other (See Comments)    Seasonal allergies   Tomato Itching   Wool Alcohol [Lanolin] Itching    Reaction unknown    Medications: Scheduled:  amLODipine   5 mg Oral Daily   glipiZIDE   10 mg Oral QAC breakfast   hydrochlorothiazide   25 mg Oral Daily   isosorbide  mononitrate  60 mg Oral Daily   [START ON 08/19/2023] levothyroxine   150 mcg Oral Once per day on Sunday Tuesday Thursday Friday Saturday   [START ON 08/20/2023] levothyroxine   75 mcg Oral Once per day on Monday Wednesday   losartan   100 mg Oral Daily   rosuvastatin   10 mg Oral QHS   sodium chloride  flush  3 mL Intravenous Q12H   spironolactone   25 mg Oral Daily   Continuous:  Results for orders placed or performed during the hospital encounter of 08/16/23 (from the past 24 hours)  CBC     Status: Abnormal   Collection Time: 08/18/23  5:22 AM  Result Value Ref Range   WBC 5.2 4.0 - 10.5 K/uL   RBC  3.84 (L) 3.87 - 5.11 MIL/uL   Hemoglobin 11.6 (L) 12.0 - 15.0 g/dL   HCT 63.1 63.9 - 53.9 %   MCV 95.8 80.0 - 100.0 fL   MCH 30.2 26.0 - 34.0 pg   MCHC 31.5 30.0 - 36.0 g/dL   RDW 86.0 88.4 - 84.4 %   Platelets 246 150 - 400 K/uL   nRBC 0.0 0.0 - 0.2 %     CT Angio Chest/Abd/Pel for Dissection W and/or W/WO Result Date: 08/16/2023 CLINICAL DATA:  Acute aortic syndrome suspected, dissection EXAM: CT ANGIOGRAPHY CHEST, ABDOMEN AND PELVIS TECHNIQUE: Non-contrast CT of the chest was initially obtained. Multidetector CT imaging through the chest, abdomen and pelvis was performed using the standard protocol during bolus administration of intravenous contrast. Multiplanar reconstructed images and MIPs were obtained and reviewed to evaluate the vascular anatomy. RADIATION DOSE  REDUCTION: This exam was performed according to the departmental dose-optimization program which includes automated exposure control, adjustment of the mA and/or kV according to patient size and/or use of iterative reconstruction technique. CONTRAST:  OMNIPAQUE  IOHEXOL  350 MG/ML SOLN COMPARISON:  None Available. FINDINGS: CTA CHEST FINDINGS VASCULAR Aorta: Satisfactory opacification of the aorta. Normal contour and caliber of the thoracic aorta. No evidence of aneurysm, dissection, or other acute aortic pathology. Cardiovascular: Positive examination for pulmonary embolism, with nonocclusive lobar to segmental embolus present in the right lower lobe (series 11, image 77, 88). Mild cardiomegaly. Gross enlargement of the main pulmonary artery measuring up to 4.4 cm in caliber. No pericardial effusion. Review of the MIP images confirms the above findings. NON VASCULAR Mediastinum/Nodes: No enlarged mediastinal, hilar, or axillary lymph nodes. Small hiatal hernia. Thyroid  gland, trachea, and esophagus demonstrate no significant findings. Lungs/Pleura: Lungs are clear. No pleural effusion or pneumothorax. Musculoskeletal: No chest wall abnormality. No acute osseous findings. Review of the MIP images confirms the above findings. CTA ABDOMEN AND PELVIS FINDINGS VASCULAR Normal contour and caliber of the abdominal aorta. No evidence of aneurysm, dissection, or other acute aortic pathology. Standard branching pattern of the abdominal aorta with solitary bilateral renal arteries. Review of the MIP images confirms the above findings. NON-VASCULAR Hepatobiliary: No solid liver abnormality is seen. Hepatic steatosis. No gallstones, gallbladder wall thickening, or biliary dilatation. Pancreas: Unremarkable. No pancreatic ductal dilatation or surrounding inflammatory changes. Spleen: Normal in size without significant abnormality. Adrenals/Urinary Tract: Adrenal glands are unremarkable. Kidneys are normal, without renal  calculi, solid lesion, or hydronephrosis. Bladder is unremarkable. Stomach/Bowel: Stomach is within normal limits. Periampullary descending duodenal diverticulum. Appendix appears normal. No evidence of bowel wall thickening, distention, or inflammatory changes. Pancolonic diverticulosis. Lymphatic: No enlarged abdominal or pelvic lymph nodes. Reproductive: Status post hysterectomy. Other: No abdominal wall hernia or abnormality. No ascites. Musculoskeletal: No acute osseous findings. IMPRESSION: 1. Positive examination for pulmonary embolism, with nonocclusive lobar to segmental embolus present in the right lower lobe. 2. Normal contour and caliber of the thoracic and abdominal aorta. No evidence of aneurysm, dissection, or other acute aortic pathology. No significant atherosclerosis. 3. Mild cardiomegaly. Gross enlargement of the main pulmonary artery measuring up to 4.4 cm in caliber, as can be seen in pulmonary hypertension. Note that this is likely incidental to pulmonary embolism given modest volume of nonocclusive embolus. 4. Hepatic steatosis. Critical findings reported by telephone to Dr. Mannie, 9:36 p.m., 08/16/2023 Electronically Signed   By: Marolyn JONETTA Jaksch M.D.   On: 08/16/2023 21:38   DG Chest 2 View Result Date: 08/16/2023 CLINICAL DATA:  Chest pain EXAM: CHEST -  2 VIEW COMPARISON:  Chest x-ray 02/20/2023 FINDINGS: The heart and mediastinal contours are unchanged. No focal consolidation. No pulmonary edema. No pleural effusion. No pneumothorax. No acute osseous abnormality. IMPRESSION: No active cardiopulmonary disease. Electronically Signed   By: Morgane  Naveau M.D.   On: 08/16/2023 19:30    ROS:  As stated above in the HPI otherwise negative.  Blood pressure (!) 117/56, pulse 67, temperature 97.9 F (36.6 C), temperature source Oral, resp. rate (!) 21, height 5' 5 (1.651 m), weight 124.7 kg, SpO2 96%.    PE: Gen: NAD, Alert and Oriented HEENT:  /AT, EOMI Neck: Supple, no LAD Lungs:  CTA Bilaterally CV: RRR without M/G/R ABD: Soft, NTND, +BS Ext: No C/C/E Rectal: small protruding external hemorrhoid with a slight amount of blood  Assessment/Plan: 1) Hemorrhoidal bleeding. 2) Hematochezia. 3) PE.   The patient's bleeding was not different from her baseline.  A visual examination of her anus showed a positive source of bleeding from an external hemorrhoid. At this time no further GI evaluation is necessary.  Plan: 1) Ok with anticoagulation. 2) Follow up with Dr. Kristie in 2-4 weeks. 3) Ok with discharge. Leo Fray D 08/18/2023, 5:40 PM

## 2023-08-18 NOTE — Progress Notes (Addendum)
 Discharge instructions were reviewed with the patient.Additional education provided regarding anticoagulant. She denied questions, or concerns at this time.Medications delivered from [facility] outpatient pharmacy. IV removed, site is clean dry and intact. Telemetry box #64 removed and returned to the desk.

## 2023-08-20 LAB — HIV-1/2 AB - DIFFERENTIATION
HIV 1 Ab: NONREACTIVE
HIV 2 Ab: NONREACTIVE
Note: NEGATIVE

## 2023-08-20 LAB — HIV-1/HIV-2 QUALITATIVE RNA
Final Interpretation: NEGATIVE
HIV-1 RNA, Qualitative: NONREACTIVE
HIV-2 RNA, Qualitative: NONREACTIVE

## 2023-09-01 ENCOUNTER — Ambulatory Visit (INDEPENDENT_AMBULATORY_CARE_PROVIDER_SITE_OTHER): Admitting: Podiatry

## 2023-09-01 ENCOUNTER — Encounter: Payer: Self-pay | Admitting: Podiatry

## 2023-09-01 DIAGNOSIS — M79674 Pain in right toe(s): Secondary | ICD-10-CM | POA: Diagnosis not present

## 2023-09-01 DIAGNOSIS — E1142 Type 2 diabetes mellitus with diabetic polyneuropathy: Secondary | ICD-10-CM

## 2023-09-01 DIAGNOSIS — M2012 Hallux valgus (acquired), left foot: Secondary | ICD-10-CM

## 2023-09-01 DIAGNOSIS — E119 Type 2 diabetes mellitus without complications: Secondary | ICD-10-CM

## 2023-09-01 DIAGNOSIS — M79675 Pain in left toe(s): Secondary | ICD-10-CM | POA: Diagnosis not present

## 2023-09-01 DIAGNOSIS — R600 Localized edema: Secondary | ICD-10-CM

## 2023-09-01 DIAGNOSIS — L84 Corns and callosities: Secondary | ICD-10-CM | POA: Diagnosis not present

## 2023-09-01 DIAGNOSIS — B351 Tinea unguium: Secondary | ICD-10-CM | POA: Diagnosis not present

## 2023-09-06 ENCOUNTER — Encounter: Payer: Self-pay | Admitting: Podiatry

## 2023-09-06 NOTE — Progress Notes (Signed)
 ANNUAL DIABETIC FOOT EXAM  Subjective: Tiffany Strickland presents today for annual diabetic foot exam. Chief Complaint  Patient presents with   [facility]    Rm2 [facility]/ Dr. Rik July 2025 A1c 8/ blood sugar 165   Patient confirms h/o diabetes.  Patient denies any h/o foot wounds.  Patient has been diagnosed with neuropathy.  Daye, Deneda T, FNP is patient's PCP.  Past Medical History:  Diagnosis Date   Colon polyp    adenomatous   Diabetes mellitus without complication (HCC)    Hyperlipidemia    Hypertension    Sleep apnea    Stroke [facility])    Thyroid  disease    Patient Active Problem List   Diagnosis Date Noted   Acute pulmonary embolism (HCC) 08/16/2023   Acute kidney injury (HCC) 08/16/2023   Posterior neck pain 05/29/2021   Chronic idiopathic constipation 06/21/2020   Diverticular disease of colon 06/21/2020   Change in bowel habit 03/15/2020   Epigastric pain 03/15/2020   Gastroesophageal reflux disease 03/15/2020   Other specified bacterial intestinal infections 03/15/2020   Pulmonary hypertension (HCC) 06/29/2018   Educated about COVID-19 virus infection 06/29/2018   OSA (obstructive sleep apnea) 02/03/2017   Hypothyroidism 02/02/2017   Chest pain 02/02/2017   Obesity, Class III, BMI 40-49.9 (morbid obesity) 01/27/2017   Plantar fasciitis of left foot 11/08/2015   Non-insulin  treated type 2 diabetes mellitus (HCC) 06/08/2015   Colon cancer screening 06/08/2015   Bilateral leg edema 12/15/2014   Blurry vision, bilateral 06/16/2014   Breast cancer screening 06/16/2014   Other specified hypothyroidism 06/16/2014   Other seasonal allergic rhinitis 06/16/2014   History of stroke 01/31/2014   Rectal nodule 09/06/2013   Essential hypertension 08/24/2013   Nonspecific (abnormal) findings on radiological and other examination of gastrointestinal tract 08/12/2013   Lateral epicondylitis of right elbow 07/21/2013   Right shoulder pain 07/21/2013   Sprain  of wrist, right 07/21/2013   Constipation 04/15/2013   Rectal bleeding 04/15/2013   Past Surgical History:  Procedure Laterality Date   ABDOMINAL HYSTERECTOMY     BLADDER SUSPENSION     COLONOSCOPY WITH PROPOFOL  N/A 05/05/2020   Procedure: COLONOSCOPY WITH PROPOFOL ;  Surgeon: Rollin Dover, MD;  Location: [facility];  Service: Endoscopy;  Laterality: N/A;   EUS N/A 08/12/2013   Procedure: LOWER ENDOSCOPIC ULTRASOUND (EUS);  Surgeon: Toribio SHAUNNA Cedar, MD;  Location: [facility];  Service: Endoscopy;  Laterality: N/A;   KNEE SURGERY     POLYPECTOMY  05/05/2020   Procedure: POLYPECTOMY;  Surgeon: Rollin Dover, MD;  Location: [facility];  Service: Endoscopy;;   TUBAL LIGATION     Current Outpatient Medications on File Prior to Visit  Medication Sig Dispense Refill   Accu-Chek FastClix Lancets MISC SMARTSIG:1 Topical Daily     amLODipine  (NORVASC ) 5 MG tablet Take 5 mg by mouth daily.     [Paused] aspirin  (ASPIR-81) 81 MG EC tablet Take 1 tablet (81 mg total) by mouth daily. Swallow whole. 30 tablet 12   Azelastine HCl 137 MCG/SPRAY SOLN Place 1 spray into both nostrils 2 (two) times daily as needed.     Blood Glucose Monitoring Suppl (RELION PRIME MONITOR) DEVI Use as directed once daily 1 each 0   Blood Glucose Monitoring Suppl (TRUE METRIX METER) DEVI 1 each by Does not apply route daily before breakfast. 1 Device 0   docusate sodium  (COLACE) 100 MG capsule Take 1 capsule (100 mg total) by mouth 2 (two) times daily.  glipiZIDE  (GLUCOTROL  XL) 10 MG 24 hr tablet Take 10 mg by mouth daily.     glucose blood (RELION PRIME TEST) test strip Use as instructed once daily 100 each 12   hydrochlorothiazide  (HYDRODIURIL ) 25 MG tablet Take 1 tablet (25 mg total) by mouth daily. 30 tablet 6   isosorbide  mononitrate (IMDUR ) 60 MG 24 hr tablet Take 60 mg by mouth daily.     levothyroxine  (EUTHYROX ) 150 MCG tablet Take 1 tablet (150 mcg total) by mouth daily before breakfast. Must have office visit  for refills. (Patient taking differently: Take 75-150 mcg by mouth as directed. Take 1 tablet (150 mcg) daily except for Mon & Wed) 30 tablet 0   losartan  (COZAAR ) 100 MG tablet Take 1 tablet (100 mg total) by mouth daily. 30 tablet 6   Multiple Vitamin (MULTIVITAMIN PO) Take 1 tablet by mouth daily.     nystatin  (MYCOSTATIN /NYSTOP ) powder Apply 1 Application topically daily as needed.     omeprazole  (PRILOSEC) 20 MG capsule Take 20 mg by mouth daily as needed.     polyethylene glycol powder (MIRALAX ) 17 GM/SCOOP powder Take 8-17 g by mouth daily for 30 doses.     RIVAROXABAN  (XARELTO ) VTE STARTER PACK (15 & 20 MG) Follow package directions: Take one 15mg  tablet by mouth twice a day. On day 22, switch to one 20mg  tablet once a day. Take with food. 51 each 0   rosuvastatin  (CRESTOR ) 10 MG tablet Take 10 mg by mouth at bedtime.     spironolactone  (ALDACTONE ) 25 MG tablet Take 25 mg by mouth daily.     No current facility-administered medications on file prior to visit.    Allergies  Allergen Reactions   Pollen Extract Other (See Comments)    Seasonal allergies   Tomato Itching   Wool Alcohol [Lanolin] Itching    Reaction unknown   Social History   Occupational History   Not on file  Tobacco Use   Smoking status: Never   Smokeless tobacco: Never  Vaping Use   Vaping status: Never Used  Substance and Sexual Activity   Alcohol use: No   Drug use: No   Sexual activity: Not on file   Family History  Problem Relation Age of Onset   Hypertension Mother    Heart disease Maternal Grandmother    Cancer Maternal Grandmother        ovarian   Cancer Maternal Grandfather    Hypertension Other    Hyperlipidemia Other    Cancer Other    Sleep apnea Other    Obesity Other    Immunization History  Administered Date(s) Administered   Influenza Split 12/11/2012   Influenza, High Dose Seasonal PF 11/26/2018   Influenza,inj,Quad PF,6+ Mos 12/11/2012, 01/31/2014, 11/08/2015, 01/27/2017,  11/06/2017   Pneumococcal Polysaccharide-23 01/27/2017   Tdap 11/26/2018, 09/26/2021, 12/15/2021   Zoster Recombinant(Shingrix) 11/26/2018     Review of Systems: Negative except as noted in the HPI.   Objective: There were no vitals filed for this visit.  Tiffany Strickland is a pleasant 68 y.o. female in NAD. AAO X 3.  Diabetic foot exam was performed with the following findings:   Vascular Examination: Capillary refill time immediate b/l. Vascular status intact b/l with palpable DP pulses; faintly palpable PT pulses. Pedal hair absent b/l. No edema. No pain with calf compression b/l. Skin temperature gradient WNL b/l. No cyanosis or clubbing noted b/l. No ischemia or gangrene b/l. Dependent edema noted b/l LE. Evidence of skin changes consistent  with long term venous stasis BLE.  Neurological Examination: Sensation grossly intact b/l with 10 gram monofilament. Vibratory sensation intact b/l. Pt has subjective symptoms of neuropathy.  Dermatological Examination: Pedal skin with normal turgor, texture and tone b/l.  No open wounds. No interdigital macerations.   Toenails 1-5 b/l thick, discolored, elongated with subungual debris and pain on dorsal palpation.   Hyperkeratotic lesion(s) medial IPJ of left great toe and medial IPJ of right great toe.  No erythema, no edema, no drainage, no fluctuance.  Musculoskeletal Examination: Muscle strength 5/5 to all lower extremity muscle groups bilaterally. No pain, crepitus or joint limitation noted with ROM bilateral LE. HAV with bunion deformity noted b/l LE. Hammertoe(s) b/l 5th toes.  Radiographs: None     Lab Results  Component Value Date   HGBA1C 6.8 (H) 09/09/2018   ADA Risk Categorization: Low Risk :  Patient has all of the following: Intact protective sensation No prior foot ulcer  No severe deformity Pedal pulses present  Assessment: 1. Pain due to onychomycosis of toenails of both feet   2. Callus   3. Hallux  valgus, acquired, bilateral   4. Bilateral leg edema   5. Diabetic peripheral neuropathy associated with type 2 diabetes mellitus (HCC)   6. Encounter for diabetic foot exam (HCC)     Plan: Diabetic foot examination performed today. All patient's and/or POA's questions/concerns addressed on today's visit. Toenails 1-5 debrided in length and girth without incident. Callus(es) medial IPJ of left great toe and medial IPJ of right great toe pared with sharp debridement without incident. Continue daily foot inspections and monitor blood glucose per PCP/Endocrinologist's recommendations.Continue soft, supportive shoe gear daily. Report any pedal injuries to medical professional. Call office if there are any questions/concerns. Return in about 3 months (around 12/02/2023).  Delon LITTIE Merlin, DPM      [facility] LOCATION: [address], [place] [address]                   Office [phone]   [facility] LOCATION: [address] [place], [place] [address] Office [phone]

## 2023-09-11 ENCOUNTER — Other Ambulatory Visit: Payer: Self-pay

## 2023-09-11 ENCOUNTER — Emergency Department (HOSPITAL_COMMUNITY)

## 2023-09-11 ENCOUNTER — Emergency Department (HOSPITAL_COMMUNITY)
Admission: EM | Admit: 2023-09-11 | Discharge: 2023-09-12 | Disposition: A | Discharge status: Home / Self Care | Source: Ambulatory Visit | Attending: Emergency Medicine | Admitting: Emergency Medicine

## 2023-09-11 DIAGNOSIS — I447 Left bundle-branch block, unspecified: Secondary | ICD-10-CM | POA: Diagnosis not present

## 2023-09-11 DIAGNOSIS — Z86711 Personal history of pulmonary embolism: Secondary | ICD-10-CM | POA: Insufficient documentation

## 2023-09-11 DIAGNOSIS — Z7901 Long term (current) use of anticoagulants: Secondary | ICD-10-CM | POA: Diagnosis not present

## 2023-09-11 DIAGNOSIS — G4733 Obstructive sleep apnea (adult) (pediatric): Secondary | ICD-10-CM | POA: Diagnosis not present

## 2023-09-11 DIAGNOSIS — I2721 Secondary pulmonary arterial hypertension: Secondary | ICD-10-CM | POA: Diagnosis not present

## 2023-09-11 DIAGNOSIS — I10 Essential (primary) hypertension: Secondary | ICD-10-CM | POA: Insufficient documentation

## 2023-09-11 DIAGNOSIS — Z8673 Personal history of transient ischemic attack (TIA), and cerebral infarction without residual deficits: Secondary | ICD-10-CM | POA: Insufficient documentation

## 2023-09-11 DIAGNOSIS — K760 Fatty (change of) liver, not elsewhere classified: Secondary | ICD-10-CM | POA: Diagnosis not present

## 2023-09-11 DIAGNOSIS — M542 Cervicalgia: Secondary | ICD-10-CM | POA: Insufficient documentation

## 2023-09-11 DIAGNOSIS — N644 Mastodynia: Secondary | ICD-10-CM | POA: Insufficient documentation

## 2023-09-11 DIAGNOSIS — Z7984 Long term (current) use of oral hypoglycemic drugs: Secondary | ICD-10-CM | POA: Diagnosis not present

## 2023-09-11 DIAGNOSIS — E119 Type 2 diabetes mellitus without complications: Secondary | ICD-10-CM | POA: Diagnosis not present

## 2023-09-11 DIAGNOSIS — I517 Cardiomegaly: Secondary | ICD-10-CM | POA: Diagnosis not present

## 2023-09-11 DIAGNOSIS — R0789 Other chest pain: Secondary | ICD-10-CM | POA: Insufficient documentation

## 2023-09-11 DIAGNOSIS — Z79899 Other long term (current) drug therapy: Secondary | ICD-10-CM | POA: Diagnosis not present

## 2023-09-11 DIAGNOSIS — Z7982 Long term (current) use of aspirin: Secondary | ICD-10-CM | POA: Insufficient documentation

## 2023-09-11 LAB — COMPREHENSIVE METABOLIC PANEL WITH GFR
ALT: 18 U/L (ref 0–44)
AST: 19 U/L (ref 15–41)
Albumin: 3.7 g/dL (ref 3.5–5.0)
Alkaline Phosphatase: 76 U/L (ref 38–126)
Anion gap: 12 (ref 5–15)
BUN: 11 mg/dL (ref 8–23)
CO2: 25 mmol/L (ref 22–32)
Calcium: 9.9 mg/dL (ref 8.9–10.3)
Chloride: 101 mmol/L (ref 98–111)
Creatinine, Ser: 0.95 mg/dL (ref 0.44–1.00)
GFR, Estimated: >60 mL/min (ref >60)
Glucose, Bld: 239 mg/dL — ABNORMAL HIGH (ref 70–99)
Potassium: 4.2 mmol/L (ref 3.5–5.1)
Sodium: 138 mmol/L (ref 135–145)
Total Bilirubin: 0.4 mg/dL (ref 0.0–1.2)
Total Protein: 8 g/dL (ref 6.5–8.1)

## 2023-09-11 LAB — BRAIN NATRIURETIC PEPTIDE: B Natriuretic Peptide: 27.1 pg/mL (ref 0.0–100.0)

## 2023-09-11 LAB — CBC
HCT: 38.7 % (ref 36.0–46.0)
Hemoglobin: 12 g/dL (ref 12.0–15.0)
MCH: 29.9 pg (ref 26.0–34.0)
MCHC: 31 g/dL (ref 30.0–36.0)
MCV: 96.3 fL (ref 80.0–100.0)
Platelets: 298 K/uL (ref 150–400)
RBC: 4.02 MIL/uL (ref 3.87–5.11)
RDW: 13.7 % (ref 11.5–15.5)
WBC: 6.5 K/uL (ref 4.0–10.5)
nRBC: 0 % (ref 0.0–0.2)

## 2023-09-11 LAB — TROPONIN I (HIGH SENSITIVITY)
Troponin I (High Sensitivity): 6 ng/L (ref <18)
Troponin I (High Sensitivity): 7 ng/L (ref <18)

## 2023-09-11 MED ORDER — ACETAMINOPHEN 500 MG PO TABS
1000.0000 mg | ORAL_TABLET | Freq: Once | ORAL | Status: AC
  Administered 2023-09-11: 1000 mg via ORAL
  Filled 2023-09-11: qty 2

## 2023-09-11 MED ORDER — RIVAROXABAN 20 MG PO TABS
20.0000 mg | ORAL_TABLET | Freq: Once | ORAL | Status: AC
  Administered 2023-09-11: 20 mg via ORAL
  Filled 2023-09-11: qty 1

## 2023-09-11 MED ORDER — ROSUVASTATIN CALCIUM 20 MG PO TABS
10.0000 mg | ORAL_TABLET | Freq: Every day | ORAL | Status: DC
  Administered 2023-09-11: 10 mg via ORAL
  Filled 2023-09-11: qty 1

## 2023-09-11 MED ORDER — PANTOPRAZOLE SODIUM 40 MG IV SOLR
40.0000 mg | Freq: Once | INTRAVENOUS | Status: AC
  Administered 2023-09-11: 40 mg via INTRAVENOUS
  Filled 2023-09-11: qty 10

## 2023-09-11 MED ORDER — LIDOCAINE VISCOUS HCL 2 % MT SOLN
15.0000 mL | Freq: Once | OROMUCOSAL | Status: AC
  Administered 2023-09-11: 15 mL via ORAL
  Filled 2023-09-11: qty 15

## 2023-09-11 MED ORDER — MORPHINE SULFATE (PF) 4 MG/ML IV SOLN
4.0000 mg | Freq: Once | INTRAVENOUS | Status: AC
  Administered 2023-09-11: 4 mg via INTRAVENOUS
  Filled 2023-09-11: qty 1

## 2023-09-11 MED ORDER — ALUM & MAG HYDROXIDE-SIMETH 200-200-20 MG/5ML PO SUSP
30.0000 mL | Freq: Once | ORAL | Status: AC
  Administered 2023-09-11: 30 mL via ORAL
  Filled 2023-09-11: qty 30

## 2023-09-11 NOTE — ED Triage Notes (Signed)
 Patient c/o neck pain and bilateral breast discomfort x 2 days. Patient discomfort is radiating to her left arm. Patient report nausea, denies vomiting. Patient denies fevet at home. Hx PE 3 weeks ago.

## 2023-09-11 NOTE — ED Provider Notes (Signed)
 [place] EMERGENCY DEPARTMENT AT [facility] Provider Note   CSN: [id number] Arrival date & time: 09/11/23  1638     History {Add pertinent medical, surgical, social history, OB history to HPI:1} Chief Complaint  Patient presents with  . Breast Pain  . Neck Pain    Shaylen Mishaal [place]dale is a 68 y.o. female with T2DM, HTN, HLD, OSA, h/o CVA, recent h/o PE on xarelto  who presents with neck pain and bilateral breast discomfort x 2 days. States she feels a burning sensation in her bilateral nipples. Denies nipple discahrge or any skin changes, no trauma to the breasts.   Patient discomfort is radiating to her left arm. Patient report nausea, denies vomiting. Patient denies fever at home. Hx PE 3 weeks ago.   Per chart review, admitted from 08/16/23-08/18/23 for PE. Found to have +HIV screen but  negative HIV final interpretation.  Past Medical History:  Diagnosis Date  . Colon polyp    adenomatous  . Diabetes mellitus without complication (HCC)   . Hyperlipidemia   . Hypertension   . Sleep apnea   . Stroke (HCC)   . Thyroid  disease        Home Medications Prior to Admission medications   Medication Sig Start Date End Date Taking? Authorizing Provider  Accu-Chek FastClix Lancets MISC SMARTSIG:1 Topical Daily 12/28/20   [provider]  amLODipine  (NORVASC ) 5 MG tablet Take 5 mg by mouth daily. 10/26/19   [provider]  aspirin  (ASPIR-81) 81 MG EC tablet Take 1 tablet (81 mg total) by mouth daily. Swallow whole. 11/08/15   Newlin, Enobong, MD  Azelastine HCl 137 MCG/SPRAY SOLN Place 1 spray into both nostrils 2 (two) times daily as needed. 11/16/21   [provider]  Blood Glucose Monitoring Suppl (RELION PRIME MONITOR) DEVI Use as directed once daily 08/06/18   Newlin, Enobong, MD  Blood Glucose Monitoring Suppl (TRUE METRIX METER) DEVI 1 each by Does not apply route daily before breakfast. 07/14/18   Delbert Clam, MD  docusate sodium   (COLACE) 100 MG capsule Take 1 capsule (100 mg total) by mouth 2 (two) times daily. 08/18/23 08/17/24  Laurence Locus, DO  glipiZIDE  (GLUCOTROL  XL) 10 MG 24 hr tablet Take 10 mg by mouth daily. 08/15/21   [provider]  glucose blood (RELION PRIME TEST) test strip Use as instructed once daily 08/06/18   Newlin, Enobong, MD  hydrochlorothiazide  (HYDRODIURIL ) 25 MG tablet Take 1 tablet (25 mg total) by mouth daily. 07/07/18   Newlin, Enobong, MD  isosorbide  mononitrate (IMDUR ) 60 MG 24 hr tablet Take 60 mg by mouth daily.    [provider]  levothyroxine  (EUTHYROX ) 150 MCG tablet Take 1 tablet (150 mcg total) by mouth daily before breakfast. Must have office visit for refills. Patient taking differently: Take 75-150 mcg by mouth as directed. Take 1 tablet (150 mcg) daily except for Mon & Wed 11/10/18   Newlin, Enobong, MD  losartan  (COZAAR ) 100 MG tablet Take 1 tablet (100 mg total) by mouth daily. 07/07/18   Newlin, Enobong, MD  Multiple Vitamin (MULTIVITAMIN PO) Take 1 tablet by mouth daily.    [provider]  nystatin  (MYCOSTATIN /NYSTOP ) powder Apply 1 Application topically daily as needed. 04/23/23   [provider]  omeprazole  (PRILOSEC) 20 MG capsule Take 20 mg by mouth daily as needed.    [provider]  polyethylene glycol powder (MIRALAX ) 17 GM/SCOOP powder Take 8-17 g by mouth daily for 30 doses. 08/18/23 09/17/23  Laurence Locus, DO  RIVAROXABAN  (XARELTO ) VTE STARTER PACK (15 & 20 MG) Follow package directions: Take one 15mg  tablet by mouth twice a day. On day 22, switch to one 20mg  tablet once a day. Take with food. 08/18/23   Laurence Locus, DO  rosuvastatin  (CRESTOR ) 10 MG tablet Take 10 mg by mouth at bedtime. 11/03/19   [provider]  spironolactone  (ALDACTONE ) 25 MG tablet Take 25 mg by mouth daily.    [provider]      Allergies    Pollen extract, Tomato, and Wool alcohol [lanolin]    Review of Systems   Review of Systems A 10 point  review of systems was performed and is negative unless otherwise reported in HPI.  Physical Exam Updated Vital Signs BP (!) 190/80 (BP Location: Left Arm)   Pulse 99   Temp 97.8 F (36.6 C) (Oral)   Resp 18   SpO2 97%  Physical Exam General: Normal appearing {Desc; female/female:11659}, lying in bed.  HEENT: PERRLA, Sclera anicteric, MMM, trachea midline.  Cardiology: RRR, no murmurs/rubs/gallops. BL radial and DP pulses equal bilaterally.  Resp: Normal respiratory rate and effort. CTAB, no wheezes, rhonchi, crackles.  Abd: Soft, non-tender, non-distended. No rebound tenderness or guarding.  GU: Deferred. MSK: No peripheral edema or signs of trauma. Extremities without deformity or TTP. No cyanosis or clubbing. Skin: warm, dry. No rashes or lesions. Back: No CVA tenderness Neuro: A&Ox4, CNs II-XII grossly intact. MAEs. Sensation grossly intact.  Psych: Normal mood and affect.   ED Results / Procedures / Treatments   Labs (all labs ordered are listed, but only abnormal results are displayed) Labs Reviewed  CBC  COMPREHENSIVE METABOLIC PANEL WITH GFR  TROPONIN I (HIGH SENSITIVITY)    EKG None  Radiology No results found.  Procedures Procedures  {Document cardiac monitor, telemetry assessment procedure when appropriate:1}  Medications Ordered in ED Medications - No data to display  ED Course/ Medical Decision Making/ A&P                          Medical Decision Making Amount and/or Complexity of Data Reviewed Labs: ordered. Decision-making details documented in ED Course. Radiology: ordered. Decision-making details documented in ED Course.  Risk OTC drugs. Prescription drug management.    This patient presents to the ED for concern of ***, this involves an extensive number of treatment options, and is a complaint that carries with it a high risk of complications and morbidity.  I considered the following differential and admission for this acute, potentially  life threatening condition.   MDM:    ***  Clinical Course as of 09/13/23 1620  Thu Sep 11, 2023  1827 Troponin I (High Sensitivity): 6 neg [HN]  1829 DG Chest Portable 1 View Mild cardiomegaly. No acute pulmonary process. [HN]  1925 B Natriuretic Peptide: 27.1 neg [HN]  2120 Pt reevaluated. Her neck pain has improved. She states her burning chest pain went away after protonix /GI cocktail but did come back for a moment while she was in the restroom, then went away again. She requests her home medications nighttime including xarelto , rosuvastatin , and losartan .  [HN]  2126 Patient now complaining of 10/10 chest pain on left side of her chest. Troponin neg x2. Will get CTA chest. [HN]    Clinical Course User Index [HN] Franklyn Sid SAILOR, MD    Labs: I Ordered, and personally interpreted labs.  The pertinent results include:  ***  Imaging Studies  ordered: I ordered imaging studies including *** I independently visualized and interpreted imaging. I agree with the radiologist interpretation  Additional history obtained from ***.  External records from outside source obtained and reviewed including ***  Cardiac Monitoring: .The patient was maintained on a cardiac monitor.  I personally viewed and interpreted the cardiac monitored which showed an underlying rhythm of: ***  Reevaluation: After the interventions noted above, I reevaluated the patient and found that they have :{resolved/improved/worsened:23923::improved}  Social Determinants of Health: .***  Disposition:  ***  Co morbidities that complicate the patient evaluation . Past Medical History:  Diagnosis Date  . Colon polyp    adenomatous  . Diabetes mellitus without complication (HCC)   . Hyperlipidemia   . Hypertension   . Sleep apnea   . Stroke (HCC)   . Thyroid  disease      Medicines No orders of the defined types were placed in this encounter.   I have reviewed the patients home medicines and have made  adjustments as needed  Problem List / ED Course: Problem List Items Addressed This Visit   None        {Document critical care time when appropriate:1} {Document review of labs and clinical decision tools ie heart score, Chads2Vasc2 etc:1}  {Document your independent review of radiology images, and any outside records:1} {Document your discussion with family members, caretakers, and with consultants:1} {Document social determinants of health affecting pt's care:1} {Document your decision making why or why not admission, treatments were needed:1}  This note was created using dictation software, which may contain spelling or grammatical errors.

## 2023-09-12 ENCOUNTER — Emergency Department (HOSPITAL_COMMUNITY)

## 2023-09-12 DIAGNOSIS — M542 Cervicalgia: Secondary | ICD-10-CM | POA: Diagnosis not present

## 2023-09-12 MED ORDER — IOHEXOL 350 MG/ML SOLN
75.0000 mL | Freq: Once | INTRAVENOUS | Status: AC | PRN
  Administered 2023-09-12: 75 mL via INTRAVENOUS

## 2023-09-12 NOTE — Discharge Instructions (Addendum)
 You can increase your omeprazole  to twice daily for the next week.

## 2023-09-12 NOTE — ED Provider Notes (Signed)
 Care assumed at midnight.  Patient with history of recent PE on anticoagulation here for evaluation of bilateral breast/chest pain and neck pain.  Care assumed pending CTA.  CTA is negative for acute abnormality.  Patient is able to eat without difficulty.  Labs are stable.  Current picture is not consistent with ACS, pneumonia, dissection, PE.  Discussed with patient findings of studies.  She is on omeprazole .  Discussed increasing this to twice daily.  Feel she is stable for discharge home with outpatient follow-up and return precautions.   Griselda Norris, MD 09/12/23 [phone]

## 2023-09-15 NOTE — ED Provider Notes (Incomplete)
  EMERGENCY DEPARTMENT AT [facility] Provider Note   CSN: [id number] Arrival date & time: 09/11/23  1638     History {Add pertinent medical, surgical, social history, OB history to HPI:1} Chief Complaint  Patient presents with  . Breast Pain  . Neck Pain    Tiffany Strickland is a 68 y.o. female with T2DM, HTN, HLD, OSA, h/o CVA, recent h/o PE on xarelto  who presents with neck pain and bilateral breast discomfort x 2 days.  Her neck pain to started today and is not like any pain she has had before.  It is bilateral and seems to be radiating down into her left arm and into her left shoulder blade.  Denies vomiting but does endorse nausea.  Denies fever/chills.  Recently diagnosed with a pulmonary embolism 3 weeks ago and has been compliant with her Eliquis at home.  Denies any shortness of breath and states that this pain is different from the pain she experienced when diagnosed with a PE, which was a stabbing through sort of pain in her right back and that pain has now resolved.  States she feels a burning sensation in her bilateral nipples. Denies nipple discharge or any skin changes, no trauma to the breasts. Per chart review, admitted from 08/16/23-08/18/23 for PE. Found to have +HIV screen but  negative HIV final interpretation.  Past Medical History:  Diagnosis Date  . Colon polyp    adenomatous  . Diabetes mellitus without complication (HCC)   . Hyperlipidemia   . Hypertension   . Sleep apnea   . Stroke (HCC)   . Thyroid  disease        Home Medications Prior to Admission medications   Medication Sig Start Date End Date Taking? Authorizing Provider  Accu-Chek FastClix Lancets MISC SMARTSIG:1 Topical Daily 12/28/20   [provider]  amLODipine  (NORVASC ) 5 MG tablet Take 5 mg by mouth daily. 10/26/19   [provider]  aspirin  (ASPIR-81) 81 MG EC tablet Take 1 tablet (81 mg total) by mouth daily. Swallow whole. 11/08/15   Newlin, Enobong,  MD  Azelastine HCl 137 MCG/SPRAY SOLN Place 1 spray into both nostrils 2 (two) times daily as needed. 11/16/21   [provider]  Blood Glucose Monitoring Suppl (RELION PRIME MONITOR) DEVI Use as directed once daily 08/06/18   Newlin, Enobong, MD  Blood Glucose Monitoring Suppl (TRUE METRIX METER) DEVI 1 each by Does not apply route daily before breakfast. 07/14/18   Newlin, Enobong, MD  docusate sodium  (COLACE) 100 MG capsule Take 1 capsule (100 mg total) by mouth 2 (two) times daily. 08/18/23 08/17/24  Laurence Locus, DO  glipiZIDE  (GLUCOTROL  XL) 10 MG 24 hr tablet Take 10 mg by mouth daily. 08/15/21   [provider]  glucose blood (RELION PRIME TEST) test strip Use as instructed once daily 08/06/18   Newlin, Enobong, MD  hydrochlorothiazide  (HYDRODIURIL ) 25 MG tablet Take 1 tablet (25 mg total) by mouth daily. 07/07/18   Newlin, Enobong, MD  isosorbide  mononitrate (IMDUR ) 60 MG 24 hr tablet Take 60 mg by mouth daily.    [provider]  levothyroxine  (EUTHYROX ) 150 MCG tablet Take 1 tablet (150 mcg total) by mouth daily before breakfast. Must have office visit for refills. Patient taking differently: Take 75-150 mcg by mouth as directed. Take 1 tablet (150 mcg) daily except for Mon & Wed 11/10/18   Newlin, Enobong, MD  losartan  (COZAAR ) 100 MG tablet Take 1 tablet (100 mg total) by mouth  daily. 07/07/18   Newlin, Enobong, MD  Multiple Vitamin (MULTIVITAMIN PO) Take 1 tablet by mouth daily.    [provider]  nystatin  (MYCOSTATIN /NYSTOP ) powder Apply 1 Application topically daily as needed. 04/23/23   [provider]  omeprazole  (PRILOSEC) 20 MG capsule Take 20 mg by mouth daily as needed.    [provider]  polyethylene glycol powder (MIRALAX ) 17 GM/SCOOP powder Take 8-17 g by mouth daily for 30 doses. 08/18/23 09/17/23  Laurence Locus, DO  RIVAROXABAN  (XARELTO ) VTE STARTER PACK (15 & 20 MG) Follow package directions: Take one 15mg  tablet by mouth twice a day. On day  22, switch to one 20mg  tablet once a day. Take with food. 08/18/23   Laurence Locus, DO  rosuvastatin  (CRESTOR ) 10 MG tablet Take 10 mg by mouth at bedtime. 11/03/19   [provider]  spironolactone  (ALDACTONE ) 25 MG tablet Take 25 mg by mouth daily.    [provider]      Allergies    Pollen extract, Tomato, and Wool alcohol [lanolin]    Review of Systems   Review of Systems A 10 point review of systems was performed and is negative unless otherwise reported in HPI.  Physical Exam Updated Vital Signs BP (!) 153/72 (BP Location: Right Arm)   Pulse 67   Temp 97.9 F (36.6 C) (Oral)   Resp 16   SpO2 95%  Physical Exam General: Normal appearing female, lying in bed.  HEENT: PERRLA, Sclera anicteric, MMM, trachea midline.  Cardiology: RRR, no murmurs/rubs/gallops. BL radial and DP pulses equal bilaterally.  Resp: Normal respiratory rate and effort. CTAB, no wheezes, rhonchi, crackles.  Breast: Normal-appearing pendulous bilateral breasts with no tenderness palpation in the bilateral nipples.  No overlying skin changes or peau d'orange.  No erythema, induration, fluctuance. Abd: Soft, non-tender, non-distended. No rebound tenderness or guarding.  GU: Deferred. MSK: No peripheral edema or signs of trauma. Extremities without deformity or TTP. No cyanosis or clubbing. Skin: warm, dry. No rashes or lesions. Back: No CVA tenderness Neuro: A&Ox4, CNs II-XII grossly intact. MAEs. Sensation grossly intact.  Psych: Normal mood and affect.   ED Results / Procedures / Treatments   Labs (all labs ordered are listed, but only abnormal results are displayed) Labs Reviewed  COMPREHENSIVE METABOLIC PANEL WITH GFR - Abnormal; Notable for the following components:      Result Value   Glucose, Bld 239 (*)    All other components within normal limits  CBC  BRAIN NATRIURETIC PEPTIDE  TROPONIN I (HIGH SENSITIVITY)  TROPONIN I (HIGH SENSITIVITY)    EKG EKG  Interpretation Date/Time:  Thursday September 11 2023 17:46:00 EDT Ventricular Rate:  84 PR Interval:    QRS Duration:  141 QT Interval:  421 QTC Calculation: 498 R Axis:   -25  Text Interpretation: sinus rhythm Left bundle branch block Similar to prior EKGs Confirmed by Franklyn Gills [phone]) on 09/11/2023 6:28:11 PM  Radiology No results found.  Procedures Procedures  {Document cardiac monitor, telemetry assessment procedure when appropriate:1}  Medications Ordered in ED Medications  acetaminophen  (TYLENOL ) tablet 1,000 mg (1,000 mg Oral Given 09/11/23 1858)  pantoprazole  (PROTONIX ) injection 40 mg (40 mg Intravenous Given 09/11/23 1859)  alum & mag hydroxide-simeth (MAALOX/MYLANTA) 200-200-20 MG/5ML suspension 30 mL (30 mLs Oral Given 09/11/23 1858)    And  lidocaine  (XYLOCAINE ) 2 % viscous mouth solution 15 mL (15 mLs Oral Given 09/11/23 1858)  rivaroxaban  (XARELTO ) tablet 20 mg (20 mg Oral Given 09/11/23 2144)  morphine  (  PF) 4 MG/ML injection 4 mg (4 mg Intravenous Given 09/11/23 2146)  iohexol  (OMNIPAQUE ) 350 MG/ML injection 75 mL (75 mLs Intravenous Contrast Given 09/12/23 0039)    ED Course/ Medical Decision Making/ A&P                          Medical Decision Making Amount and/or Complexity of Data Reviewed Labs: ordered. Decision-making details documented in ED Course. Radiology: ordered. Decision-making details documented in ED Course.  Risk OTC drugs. Prescription drug management.    This patient presents to the ED for concern of ***, this involves an extensive number of treatment options, and is a complaint that carries with it a high risk of complications and morbidity.  I considered the following differential and admission for this acute, potentially life threatening condition.   MDM:    ***  Clinical Course as of 09/14/23 2359  Thu Sep 11, 2023  1827 Troponin I (High Sensitivity): 6 neg [HN]  1829 DG Chest Portable 1 View Mild cardiomegaly. No acute pulmonary  process. [HN]  1925 B Natriuretic Peptide: 27.1 neg [HN]  2120 Pt reevaluated. Her neck pain has improved. She states her burning chest pain went away after protonix /GI cocktail but did come back for a moment while she was in the restroom, then went away again. She requests her home medications nighttime including xarelto , rosuvastatin , and losartan .  [HN]  2126 Patient now complaining of 10/10 chest pain on left side of her chest. Troponin neg x2. Will get CTA chest. [HN]    Clinical Course User Index [HN] Franklyn Sid SAILOR, MD    Labs: I Ordered, and personally interpreted labs.  The pertinent results include:  ***  Imaging Studies ordered: I ordered imaging studies including *** I independently visualized and interpreted imaging. I agree with the radiologist interpretation  Additional history obtained from ***.  External records from outside source obtained and reviewed including ***  Cardiac Monitoring: .The patient was maintained on a cardiac monitor.  I personally viewed and interpreted the cardiac monitored which showed an underlying rhythm of: ***  Reevaluation: After the interventions noted above, I reevaluated the patient and found that they have :{resolved/improved/worsened:23923::improved}  Social Determinants of Health: .***  Disposition:  ***  Co morbidities that complicate the patient evaluation . Past Medical History:  Diagnosis Date  . Colon polyp    adenomatous  . Diabetes mellitus without complication (HCC)   . Hyperlipidemia   . Hypertension   . Sleep apnea   . Stroke (HCC)   . Thyroid  disease      Medicines Meds ordered this encounter  Medications  . acetaminophen  (TYLENOL ) tablet 1,000 mg  . pantoprazole  (PROTONIX ) injection 40 mg  . AND Linked Order Group   . alum & mag hydroxide-simeth (MAALOX/MYLANTA) 200-200-20 MG/5ML suspension 30 mL   . lidocaine  (XYLOCAINE ) 2 % viscous mouth solution 15 mL  . rivaroxaban  (XARELTO ) tablet 20 mg  .  DISCONTD: rosuvastatin  (CRESTOR ) tablet 10 mg  . morphine  (PF) 4 MG/ML injection 4 mg  . iohexol  (OMNIPAQUE ) 350 MG/ML injection 75 mL    I have reviewed the patients home medicines and have made adjustments as needed  Problem List / ED Course: Problem List Items Addressed This Visit   None Visit Diagnoses       Atypical chest pain    -  Primary            {Document critical care time when appropriate:1} {Document  review of labs and clinical decision tools ie heart score, Chads2Vasc2 etc:1}  {Document your independent review of radiology images, and any outside records:1} {Document your discussion with family members, caretakers, and with consultants:1} {Document social determinants of health affecting pt's care:1} {Document your decision making why or why not admission, treatments were needed:1}  This note was created using dictation software, which may contain spelling or grammatical errors.

## 2023-09-17 ENCOUNTER — Telehealth: Payer: Self-pay | Admitting: Podiatry

## 2023-09-17 NOTE — Telephone Encounter (Signed)
 Patient called she would like a rx for diabetic shoes to be faxed over. She states she will do [facility] if she needs too.

## 2023-09-18 ENCOUNTER — Other Ambulatory Visit (INDEPENDENT_AMBULATORY_CARE_PROVIDER_SITE_OTHER): Admitting: Podiatry

## 2023-09-18 DIAGNOSIS — M2012 Hallux valgus (acquired), left foot: Secondary | ICD-10-CM

## 2023-09-18 DIAGNOSIS — E1142 Type 2 diabetes mellitus with diabetic polyneuropathy: Secondary | ICD-10-CM

## 2023-09-18 DIAGNOSIS — M2040 Other hammer toe(s) (acquired), unspecified foot: Secondary | ICD-10-CM

## 2023-09-18 DIAGNOSIS — M2011 Hallux valgus (acquired), right foot: Secondary | ICD-10-CM

## 2023-09-18 NOTE — Progress Notes (Signed)
 1. Diabetic peripheral neuropathy associated with type 2 diabetes mellitus (HCC)   2. Hallux valgus, acquired, bilateral   3. Acquired hammertoe    Orders Placed This Encounter  Procedures   For home use only DME Other see comment    To [facility]: Dispense one pair extra depth shoes and 3 pair heat moldable insoles.    Length of Need:   6 Months

## 2023-10-21 ENCOUNTER — Ambulatory Visit (INDEPENDENT_AMBULATORY_CARE_PROVIDER_SITE_OTHER)

## 2023-10-21 ENCOUNTER — Ambulatory Visit (HOSPITAL_COMMUNITY)
Admission: RE | Admit: 2023-10-21 | Discharge: 2023-10-21 | Disposition: A | Discharge status: Home / Self Care | Source: Ambulatory Visit

## 2023-10-21 ENCOUNTER — Ambulatory Visit (HOSPITAL_COMMUNITY): Payer: Self-pay | Admitting: Internal Medicine

## 2023-10-21 ENCOUNTER — Encounter (HOSPITAL_COMMUNITY): Payer: Self-pay

## 2023-10-21 VITALS — BP 125/72 | HR 80 | Temp 98.3°F | Resp 20

## 2023-10-21 DIAGNOSIS — R051 Acute cough: Secondary | ICD-10-CM | POA: Diagnosis not present

## 2023-10-21 DIAGNOSIS — R0602 Shortness of breath: Secondary | ICD-10-CM

## 2023-10-21 DIAGNOSIS — B9689 Other specified bacterial agents as the cause of diseases classified elsewhere: Secondary | ICD-10-CM | POA: Diagnosis not present

## 2023-10-21 DIAGNOSIS — J019 Acute sinusitis, unspecified: Secondary | ICD-10-CM

## 2023-10-21 HISTORY — DX: Other pulmonary embolism without acute cor pulmonale: I26.99

## 2023-10-21 MED ORDER — BENZONATATE 100 MG PO CAPS: 100.0000 mg | ORAL_CAPSULE | Freq: Three times a day (TID) | ORAL | 0 refills | Status: AC

## 2023-10-21 MED ORDER — AMOXICILLIN-POT CLAVULANATE 875-125 MG PO TABS: 1.0000 | ORAL_TABLET | Freq: Two times a day (BID) | ORAL | 0 refills | Status: AC

## 2023-10-21 MED ORDER — GUAIFENESIN ER 600 MG PO TB12: 600.0000 mg | ORAL_TABLET | Freq: Two times a day (BID) | ORAL | 0 refills | Status: AC

## 2023-10-21 MED ORDER — ALBUTEROL SULFATE HFA 108 (90 BASE) MCG/ACT IN AERS
2.0000 | INHALATION_SPRAY | Freq: Once | RESPIRATORY_TRACT | Status: AC
  Administered 2023-10-21: 2 via RESPIRATORY_TRACT

## 2023-10-21 MED ORDER — ALBUTEROL SULFATE HFA 108 (90 BASE) MCG/ACT IN AERS
INHALATION_SPRAY | RESPIRATORY_TRACT | Status: AC
  Filled 2023-10-21: qty 6.7

## 2023-10-21 MED ORDER — AEROCHAMBER PLUS FLO-VU MEDIUM MISC: 1.0000 | Freq: Once | Status: DC

## 2023-10-21 NOTE — Discharge Instructions (Addendum)
 Take antibiotic sent to pharmacy as directed to treat sinus infection. Tylenol  as needed for sinus pain, fevers/chills, etc.  Purchase Mucinex  over the counter and take this every 12 hours as needed for nasal congestion and cough. Cough medicines as needed. Warm compresses to the cheeks and forehead as needed to help with sinus headaches as well as tylenol  as needed.  The antibiotic will also treat any infection to the chest. The radiologist has not read your x-ray yet and the staff will call if radiology read read shows need for additional treatment.  Use albuterol  inhaler 2 puffs every 4-6 hours as needed for shortness of breath and coughing.  Rinse out your mouth after using the albuterol  inhaler.  If you develop any new or worsening symptoms or if your symptoms do not start to improve, please return here or follow-up with your primary care provider. If your symptoms are severe, please go to the emergency room.

## 2023-10-21 NOTE — ED Provider Notes (Signed)
 [facility]    CSN: [id number] Arrival date & time: 10/21/23  0856      History   Chief Complaint Chief Complaint  Patient presents with   Cough   Nasal Congestion    HPI Tiffany Strickland is a 68 y.o. female.   Tiffany Strickland is a 68 y.o. female presenting for chief complaint of cough, sore throat, nasal congestion, generalized fatigue, and intermittent shortness of breath associated with coughing that started 7 to 8 days ago.  Cough is much improved per patient since initial onset of symptoms.  Cough remains dry and worse at nighttime.  Nasal reduction started out as clear and is now yellow/green.  She is experiencing localized tenderness to the left maxillary sinus and has copious excretions from the nose.  Denies chest pain, leg swelling, orthopnea, nausea, vomiting, and fevers.  Denies recent antibiotic or steroid use in the last 90 days. Denies recent sick contacts with similar symptoms. Never smoker, denies history of chronic respiratory problems like asthma/COPD/bronchitis. She has a history of pulmonary embolism and is anticoagulated on Xarelto  (diagnosed with PE August 16, 2023). Taking menthol cough drops and using warm water rinses to the throat with minimal relief.   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/21/2020   Change in bowel habit 03/15/2020   Epigastric pain 03/15/2020   Gastroesophageal reflux disease 03/15/2020   Other specified bacterial intestinal infections 03/15/2020   Pulmonary  hypertension (HCC) 06/29/2018   Educated about COVID-19 virus infection 06/29/2018   OSA (obstructive sleep apnea) 02/03/2017   Hypothyroidism 02/02/2017   Chest pain 02/02/2017   Obesity, Class III, BMI 40-49.9 (morbid obesity) 01/27/2017   Plantar fasciitis of left foot 11/08/2015   Non-insulin  treated type 2 diabetes mellitus (HCC) 06/08/2015   Colon cancer screening 06/08/2015   Bilateral leg edema 12/15/2014   Blurry vision, bilateral 06/16/2014   Breast cancer screening 06/16/2014   Other specified hypothyroidism 06/16/2014   Other seasonal allergic rhinitis 06/16/2014   History of stroke 01/31/2014   Rectal nodule 09/06/2013   Essential hypertension 08/24/2013   Nonspecific (abnormal) findings on radiological and other examination of gastrointestinal tract 08/12/2013   Lateral epicondylitis of right elbow 07/21/2013   Right shoulder pain 07/21/2013   Sprain of wrist, right 07/21/2013   Constipation 04/15/2013   Rectal bleeding 04/15/2013    Past Surgical History:  Procedure Laterality Date   ABDOMINAL HYSTERECTOMY     BLADDER SUSPENSION     COLONOSCOPY WITH PROPOFOL  N/A 05/05/2020   Procedure: COLONOSCOPY WITH PROPOFOL ;  Surgeon: Rollin Dover, MD;  Location: [facility];  Service: Endoscopy;  Laterality: N/A;   EUS N/A 08/12/2013   Procedure: LOWER ENDOSCOPIC ULTRASOUND (EUS);  Surgeon: Toribio SHAUNNA Cedar, MD;  Location: [facility];  Service: Endoscopy;  Laterality: N/A;   KNEE SURGERY     POLYPECTOMY  05/05/2020   Procedure: POLYPECTOMY;  Surgeon: Rollin Dover, MD;  Location: [facility];  Service: Endoscopy;;   TUBAL  LIGATION      OB History     Gravida  6   Para  5   Term  5   Preterm      AB  1   Living  5      SAB  1   IAB      Ectopic      Multiple      Live Births               Home Medications    Prior to Admission medications   Medication Sig Start Date End Date Taking? Authorizing Provider  amLODipine  (NORVASC ) 5 MG tablet  Take 5 mg by mouth daily. 10/26/19  Yes [provider]  amoxicillin -clavulanate (AUGMENTIN ) 875-125 MG tablet Take 1 tablet by mouth every 12 (twelve) hours. 10/21/23  Yes Enedelia Dorna HERO, FNP  Azelastine HCl 137 MCG/SPRAY SOLN Place 1 spray into both nostrils 2 (two) times daily as needed. 11/16/21  Yes [provider]  benzonatate  (TESSALON ) 100 MG capsule Take 1 capsule (100 mg total) by mouth every 8 (eight) hours. 10/21/23  Yes Enedelia Dorna HERO, FNP  glipiZIDE  (GLUCOTROL  XL) 10 MG 24 hr tablet Take 10 mg by mouth daily. 08/15/21  Yes [provider]  guaiFENesin  (MUCINEX ) 600 MG 12 hr tablet Take 1 tablet (600 mg total) by mouth 2 (two) times daily. 10/21/23  Yes Enedelia Dorna HERO, FNP  hydrochlorothiazide  (HYDRODIURIL ) 25 MG tablet Take 1 tablet (25 mg total) by mouth daily. 07/07/18  Yes Newlin, Enobong, MD  isosorbide  mononitrate (IMDUR ) 60 MG 24 hr tablet Take 60 mg by mouth daily.   Yes [provider]  levothyroxine  (EUTHYROX ) 150 MCG tablet Take 1 tablet (150 mcg total) by mouth daily before breakfast. Must have office visit for refills. Patient taking differently: Take 75-150 mcg by mouth as directed. Take 1 tablet (150 mcg) daily except for Mon & Wed 11/10/18  Yes Newlin, Enobong, MD  losartan  (COZAAR ) 100 MG tablet Take 1 tablet (100 mg total) by mouth daily. 07/07/18  Yes Newlin, Enobong, MD  Multiple Vitamin (MULTIVITAMIN PO) Take 1 tablet by mouth daily.   Yes [provider]  RIVAROXABAN  (XARELTO ) VTE STARTER PACK (15 & 20 MG) Follow package directions: Take one 15mg  tablet by mouth twice a day. On day 22, switch to one 20mg  tablet once a day. Take with food. 08/18/23  Yes Laurence Locus, DO  rosuvastatin  (CRESTOR ) 10 MG tablet Take 10 mg by mouth at bedtime. 11/03/19  Yes [provider]  spironolactone  (ALDACTONE ) 25 MG tablet Take 25 mg by mouth daily.   Yes [provider]  Accu-Chek FastClix Lancets MISC SMARTSIG:1  Topical Daily 12/28/20   [provider]  aspirin  (ASPIR-81) 81 MG EC tablet Take 1 tablet (81 mg total) by mouth daily. Swallow whole. 11/08/15   Newlin, Enobong, MD  Blood Glucose Monitoring Suppl (RELION PRIME MONITOR) DEVI Use as directed once daily 08/06/18   Newlin, Enobong, MD  Blood Glucose Monitoring Suppl (TRUE METRIX METER) DEVI 1 each by Does not apply route daily before breakfast. 07/14/18   Newlin, Enobong, MD  docusate sodium  (COLACE) 100 MG capsule Take 1 capsule (100 mg total) by mouth 2 (two) times daily. 08/18/23 08/17/24  Laurence Locus, DO  fluticasone  (FLONASE ) 50 MCG/ACT nasal spray 1 spray in each nostril Nasally Twice a day    [provider]  glucose blood (RELION PRIME TEST) test strip Use as instructed once daily 08/06/18   Newlin,  Enobong, MD  nystatin  (MYCOSTATIN /NYSTOP ) powder Apply 1 Application topically daily as needed. 04/23/23   [provider]  omeprazole  (PRILOSEC) 20 MG capsule Take 20 mg by mouth daily as needed.    [provider]    Family History Family History  Problem Relation Age of Onset   Hypertension Mother    Heart disease Maternal Grandmother    Cancer Maternal Grandmother        ovarian   Cancer Maternal Grandfather    Hypertension Other    Hyperlipidemia Other    Cancer Other    Sleep apnea Other    Obesity Other     Social History Social History   Tobacco Use   Smoking status: Never   Smokeless tobacco: Never  Vaping Use   Vaping status: Never Used  Substance Use Topics   Alcohol use: No   Drug use: No     Allergies   Pollen extract, Tomato, and Wool alcohol [lanolin]   Review of Systems Review of Systems  Respiratory:  Positive for cough.   Per HPI   Physical Exam Triage Vital Signs ED Triage Vitals  Encounter Vitals Group     BP 10/21/23 0915 125/72     Girls Systolic BP Percentile --      Girls Diastolic BP Percentile --      Boys Systolic BP Percentile --      Boys Diastolic BP  Percentile --      Pulse Rate 10/21/23 0915 80     Resp 10/21/23 0915 20     Temp 10/21/23 0915 98.3 F (36.8 C)     Temp Source 10/21/23 0915 Oral     SpO2 10/21/23 0915 97 %     Weight --      Height --      Head Circumference --      Peak Flow --      Pain Score 10/21/23 0912 0     Pain Loc --      Pain Education --      Exclude from Growth Chart --    No data found.  Updated Vital Signs BP 125/72 (BP Location: Left Arm)   Pulse 80   Temp 98.3 F (36.8 C) (Oral)   Resp 20   SpO2 97%   Visual Acuity Right Eye Distance:   Left Eye Distance:   Bilateral Distance:    Right Eye Near:   Left Eye Near:    Bilateral Near:     Physical Exam Vitals and nursing note reviewed.  Constitutional:      Appearance: She is ill-appearing. She is not toxic-appearing.  HENT:     Head: Normocephalic and atraumatic.     Jaw: There is normal jaw occlusion.     Right Ear: Hearing, tympanic membrane, ear canal and external ear normal.     Left Ear: Hearing, tympanic membrane, ear canal and external ear normal.     Nose: Congestion (yellow mucous) present.     Right Sinus: No maxillary sinus tenderness or frontal sinus tenderness.     Left Sinus: Maxillary sinus tenderness and frontal sinus tenderness present.     Mouth/Throat:     Lips: Pink.     Mouth: Mucous membranes are moist. No injury or oral lesions.     Dentition: Normal dentition.     Tongue: No lesions.     Pharynx: Oropharynx is clear. Uvula midline. No pharyngeal swelling, oropharyngeal exudate, posterior oropharyngeal erythema, uvula swelling or postnasal  drip.     Tonsils: No tonsillar exudate.  Eyes:     General: Lids are normal. Vision grossly intact. Gaze aligned appropriately.     Extraocular Movements: Extraocular movements intact.     Conjunctiva/sclera: Conjunctivae normal.  Neck:     Trachea: Trachea and phonation normal.  Cardiovascular:     Rate and Rhythm: Normal rate and regular rhythm.     Heart  sounds: Normal heart sounds, S1 normal and S2 normal.  Pulmonary:     Effort: Pulmonary effort is normal. No respiratory distress.     Breath sounds: Normal breath sounds and air entry.  Musculoskeletal:     Cervical back: Neck supple.  Lymphadenopathy:     Cervical: Cervical adenopathy present.  Skin:    General: Skin is warm and dry.     Capillary Refill: Capillary refill takes less than 2 seconds.     Findings: No rash.  Neurological:     General: No focal deficit present.     Mental Status: She is alert and oriented to person, place, and time. Mental status is at baseline.     Cranial Nerves: No dysarthria or facial asymmetry.  Psychiatric:        Mood and Affect: Mood normal.        Speech: Speech normal.        Behavior: Behavior normal.        Thought Content: Thought content normal.        Judgment: Judgment normal.      UC Treatments / Results  Labs (all labs ordered are listed, but only abnormal results are displayed) Labs Reviewed - No data to display  EKG   Radiology DG Chest 2 View Result Date: 10/21/2023 CLINICAL DATA:  Cough and congestion with shortness of breath x1 week. EXAM: CHEST - 2 VIEW COMPARISON:  September 11, 2023 FINDINGS: The heart size and mediastinal contours are within normal limits. No acute infiltrate, pleural effusion or pneumothorax is identified the visualized skeletal structures are unremarkable. IMPRESSION: No active cardiopulmonary disease. Electronically Signed   By: Suzen Dials M.D.   On: 10/21/2023 11:05    Procedures Procedures (including critical care time)  Medications Ordered in UC Medications  AeroChamber Plus Flo-Vu Medium MISC 1 each (1 each Other Not Given 10/21/23 1050)  albuterol  (VENTOLIN  HFA) 108 (90 Base) MCG/ACT inhaler 2 puff (2 puffs Inhalation Given 10/21/23 1047)    Initial Impression / Assessment and Plan / UC Course  I have reviewed the triage vital signs and the nursing notes.  Pertinent labs & imaging  results that were available during my care of the patient were reviewed by me and considered in my medical decision making (see chart for details).   1.  Acute bacterial sinusitis, acute cough, shortness of breath Presentation is consistent with acute postviral bacterial sinusitis.   Symptoms have been present for greater than 7 days and have not responded well to over-the-counter therapies, therefore may have antibiotic.  Prescriptions for further symptomatic relief sent, may continue using OTC medications as needed. Chest x-ray unremarkable for signs of pneumonia.  Albuterol  sent for shortness of breath.   Counseled patient on potential for adverse effects with medications prescribed/recommended today, strict ER and return-to-clinic precautions discussed, patient verbalized understanding.    Final Clinical Impressions(s) / UC Diagnoses   Final diagnoses:  Shortness of breath  Acute cough  Acute bacterial sinusitis     Discharge Instructions      Take antibiotic sent to pharmacy  as directed to treat sinus infection. Tylenol  as needed for sinus pain, fevers/chills, etc.  Purchase Mucinex  over the counter and take this every 12 hours as needed for nasal congestion and cough. Cough medicines as needed. Warm compresses to the cheeks and forehead as needed to help with sinus headaches as well as tylenol  as needed.  The antibiotic will also treat any infection to the chest. The radiologist has not read your x-ray yet and the staff will call if radiology read read shows need for additional treatment.  Use albuterol  inhaler 2 puffs every 4-6 hours as needed for shortness of breath and coughing.  Rinse out your mouth after using the albuterol  inhaler.  If you develop any new or worsening symptoms or if your symptoms do not start to improve, please return here or follow-up with your primary care provider. If your symptoms are severe, please go to the emergency room.      ED  Prescriptions     Medication Sig Dispense Auth. Provider   guaiFENesin  (MUCINEX ) 600 MG 12 hr tablet Take 1 tablet (600 mg total) by mouth 2 (two) times daily. 30 tablet Enedelia Dorna HERO, FNP   benzonatate  (TESSALON ) 100 MG capsule Take 1 capsule (100 mg total) by mouth every 8 (eight) hours. 21 capsule Mitzy Naron M, FNP   amoxicillin -clavulanate (AUGMENTIN ) 875-125 MG tablet Take 1 tablet by mouth every 12 (twelve) hours. 14 tablet Enedelia Dorna HERO, FNP      PDMP not reviewed this encounter.   Enedelia Dorna HERO, [place] 10/21/23 1127

## 2023-10-21 NOTE — ED Triage Notes (Signed)
 Pt states that she has had cough and congestion X 1 week. She has been taking honey and lemon, cough drops in hot water.

## 2023-10-29 NOTE — Therapy (Unsigned)
 OUTPATIENT PHYSICAL THERAPY LOWER EXTREMITY EVALUATION   Patient Name: Tiffany Strickland MRN: [id number] DOB:December 03, 1955, 68 y.o., female Today's Date: 10/30/2023  END OF SESSION:  PT End of Session - 10/30/23 0819     Visit Number 1    Number of Visits 17    Date for PT Re-Evaluation 12/25/23    Authorization Type [employer] Dual    Authorization Time Period 10/30/23 to 12/25/23    Progress Note Due on Visit 10    PT Start Time 0803    PT Stop Time 0842    PT Time Calculation (min) 39 min    Activity Tolerance Patient tolerated treatment well    Behavior During Therapy [facility] for tasks assessed/performed          Past Medical History:  Diagnosis Date   Colon polyp    adenomatous   Diabetes mellitus without complication (HCC)    Hyperlipidemia    Hypertension    Pulmonary embolism (HCC)    Sleep apnea    Stroke (HCC)    Thyroid  disease    Past Surgical History:  Procedure Laterality Date   ABDOMINAL HYSTERECTOMY     BLADDER SUSPENSION     COLONOSCOPY WITH PROPOFOL  N/A 05/05/2020   Procedure: COLONOSCOPY WITH PROPOFOL ;  Surgeon: Rollin Dover, MD;  Location: [facility];  Service: Endoscopy;  Laterality: N/A;   EUS N/A 08/12/2013   Procedure: LOWER ENDOSCOPIC ULTRASOUND (EUS);  Surgeon: Toribio SHAUNNA Cedar, MD;  Location: [facility];  Service: Endoscopy;  Laterality: N/A;   KNEE SURGERY     POLYPECTOMY  05/05/2020   Procedure: POLYPECTOMY;  Surgeon: Rollin Dover, MD;  Location: [facility];  Service: Endoscopy;;   TUBAL LIGATION     Patient Active Problem List   Diagnosis Date Noted   Acute pulmonary embolism (HCC) 08/16/2023   Acute kidney injury (HCC) 08/16/2023   Posterior neck pain 05/29/2021   Chronic idiopathic constipation 06/21/2020   Diverticular disease of colon 06/21/2020   Change in bowel habit 03/15/2020   Epigastric pain 03/15/2020   Gastroesophageal reflux disease 03/15/2020   Other specified bacterial intestinal infections 03/15/2020   Pulmonary  hypertension (HCC) 06/29/2018   Educated about COVID-19 virus infection 06/29/2018   OSA (obstructive sleep apnea) 02/03/2017   Hypothyroidism 02/02/2017   Chest pain 02/02/2017   Obesity, Class III, BMI 40-49.9 (morbid obesity) 01/27/2017   Plantar fasciitis of left foot 11/08/2015   Non-insulin  treated type 2 diabetes mellitus (HCC) 06/08/2015   Colon cancer screening 06/08/2015   Bilateral leg edema 12/15/2014   Blurry vision, bilateral 06/16/2014   Breast cancer screening 06/16/2014   Other specified hypothyroidism 06/16/2014   Other seasonal allergic rhinitis 06/16/2014   History of stroke 01/31/2014   Rectal nodule 09/06/2013   Essential hypertension 08/24/2013   Nonspecific (abnormal) findings on radiological and other examination of gastrointestinal tract 08/12/2013   Lateral epicondylitis of right elbow 07/21/2013   Right shoulder pain 07/21/2013   Sprain of wrist, right 07/21/2013   Constipation 04/15/2013   Rectal bleeding 04/15/2013    PCP: Rik Glinda DASEN, FNP  REFERRING PROVIDER: Rik Glinda DASEN, FNP  REFERRING DIAG: [id number] (ICD-10-CM) - Pain in left knee  THERAPY DIAG:  Chronic pain of right knee  Chronic pain of left knee  Stiffness of right knee, not elsewhere classified  Stiffness of left knee, not elsewhere classified  Difficulty in walking, not elsewhere classified  Muscle weakness (generalized)  Rationale for Evaluation and Treatment: Rehabilitation  ONSET DATE:  Years off and  on   SUBJECTIVE:   SUBJECTIVE STATEMENT:  I've had this issue for years off and on. Both knees hurt when I put pressure on it. I think I did too much one day and that's why I ended up seeing the doctor. Have had therapy for knees in the past but one time a baker's cyst developed and I ended up getting worse.    PERTINENT HISTORY:  See above  PAIN:  Are you having pain? Yes: NPRS scale: 6/10 Pain location: B knees, L worse than R   Pain description: inflamed and  sore  Aggravating factors: WB  Relieving factors: elevation, not WB, heat   PRECAUTIONS: Other: active blood thinners due to recent PE   RED FLAGS: None   WEIGHT BEARING RESTRICTIONS: No  FALLS:  Has patient fallen in last 6 months? Yes. 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  DIAGNOSTIC FINDINGS:   Narrative & Impression CLINICAL DATA:  Chest pain   EXAM: PORTABLE CHEST 1 VIEW   COMPARISON:  Chest x-ray 08/16/2023   FINDINGS: The heart is mildly enlarged. The lungs are clear. There is no pleural effusion or pneumothorax. No acute fractures are seen.   IMPRESSION: Mild cardiomegaly. No acute pulmonary process.  IMPRESSION: 1. No pulmonary embolism. No acute intrathoracic pathology identified. 2. Morphologic changes in keeping with pulmonary arterial hypertension. 3. Mild cardiomegaly. 4. Mild hepatic steatosis.   PATIENT SURVEYS:   LEFS 46/80   COGNITION: Overall cognitive status: Within functional limits for tasks assessed     FLEXIBILITY  Tight hip ER groups, consistent toe out with gait and static positions Tight HS and hip flexors per observation     LOWER EXTREMITY ROM:  Active ROM Right eval Left eval  Hip flexion    Hip extension    Hip abduction    Hip adduction    Hip internal rotation    Hip external rotation    Knee flexion 103* 109*  Knee extension 9* 12*  Ankle dorsiflexion     Ankle plantarflexion    Ankle inversion    Ankle eversion     (Blank rows = not tested)  LOWER EXTREMITY MMT:  MMT Right eval Left eval  Hip flexion 4+ 4+  Hip extension    Hip abduction 3 3+  Hip adduction    Hip internal rotation    Hip external rotation    Knee flexion 4 4  Knee extension 4 4  Ankle dorsiflexion    Ankle plantarflexion    Ankle inversion    Ankle eversion     (Blank rows = not tested)  TREATMENT DATE:   10/30/23  Eval, POC, HEP and education as below    Nustep L4x6 minutes all four extremities, seat 10  HEP practice and discussion    PATIENT EDUCATION:  Education details: exam findings, POC, HEP  Person educated: Patient Education method: Explanation, Demonstration, and Handouts Education comprehension: verbalized understanding, returned demonstration, and needs further education  HOME EXERCISE PROGRAM:  Access Code: [email or web address] URL: [email or web address] Date: 10/30/2023 Prepared by: Josette Rough  Exercises - Supine Figure 4 Piriformis Stretch  - 1 x daily - 7 x weekly - 2 sets - 4-6 reps - 30 seconds  hold - Supine Bridge with Resistance Band  - 1 x daily - 7 x weekly - 2 sets - 10 reps - 2 seconds  hold - Clamshell with Resistance  - 1 x daily - 7 x weekly - 2 sets - 10 reps - 1 second  hold - Seated Knee Extension with Anchored Resistance  - 1 x daily - 7 x weekly - 2 sets - 10 reps - 2 seconds  hold  ASSESSMENT:  CLINICAL IMPRESSION:  Patient is a 69 y.o. F who was seen today for physical therapy evaluation and treatment for M25.562 (ICD-10-CM) - Pain in left knee. Objectives as above. Has recent history of PE due to sedentary period, also has history of Baker's cyst and limited success in PT in the past due to this. Will make every effort to improve pain and function moving  forward.  OBJECTIVE IMPAIRMENTS: Abnormal gait, decreased activity tolerance, decreased mobility, difficulty walking, decreased ROM, decreased strength, impaired flexibility, obesity, and pain.   ACTIVITY LIMITATIONS: standing, squatting, stairs, transfers, and locomotion level  PARTICIPATION LIMITATIONS: meal prep, cleaning, laundry, driving, shopping, community activity, and yard work  PERSONAL FACTORS: Age, Behavior pattern, Education, Fitness, Past/current experiences, Social background, and Time since onset of injury/illness/exacerbation are also affecting patient's functional outcome.   REHAB POTENTIAL: Fair body habitus, chronicity of pain, limited success with PT in the past   CLINICAL DECISION MAKING: Stable/uncomplicated  EVALUATION COMPLEXITY: Low   GOALS: Goals reviewed with patient? No  SHORT TERM GOALS: Target date: 11/27/2023   Will be compliant with appropriate progressive HEP Goal status: initial    2. Knee AROM flexion to be at least 115* B and extension AROM to be no more than 5* B  Goal status: initial   3. Will be independent with BLE edema management strategies  Goal status: initial    4. Gait pattern to have normalized to include consistent step through pattern with good heel-toe mechanics, no toe out with gait Goal status: initial    LONG TERM GOALS: Target date: 12/25/2023    MMT to have improved by one grade all weak groups Goal status: initial   2. Will score at least 45 on Berg to show reduced fall risk  Goal status: initial    3. Will be able to ascend and descend stairs reciprocally with no increase in pain Goal status: initial   4. Will be able to ambulate community distances and perform all household tasks with no increase in pain  Goal status: initial   5. LEFS to have improved by at least 10 points to show improved QOL and subjective improvement     PLAN:  PT FREQUENCY: 2x/week  PT DURATION: 8 weeks  PLANNED  INTERVENTIONS: 97750- Physical Performance Testing, 97110-Therapeutic exercises, 97530- Therapeutic activity, [id number]- Neuromuscular re-education, 97535- Self Care, [id number]- Manual therapy, [id number]- Gait training, [id number]- Aquatic Therapy, [phone]- Vasopneumatic device, and  Patient/Family education  PLAN FOR NEXT SESSION: knee ROM, gross strengthening, discuss compression stockings to reduce BLE edema, could consider water PT if she makes limited progress on land   Josette Rough, PT, DPT 10/30/23 8:45 AM

## 2023-10-30 ENCOUNTER — Encounter: Payer: Self-pay | Admitting: Physical Therapy

## 2023-10-30 ENCOUNTER — Ambulatory Visit: Attending: Family | Admitting: Physical Therapy

## 2023-10-30 ENCOUNTER — Other Ambulatory Visit: Payer: Self-pay

## 2023-10-30 DIAGNOSIS — R262 Difficulty in walking, not elsewhere classified: Secondary | ICD-10-CM | POA: Diagnosis present

## 2023-10-30 DIAGNOSIS — M25662 Stiffness of left knee, not elsewhere classified: Secondary | ICD-10-CM | POA: Insufficient documentation

## 2023-10-30 DIAGNOSIS — M6281 Muscle weakness (generalized): Secondary | ICD-10-CM | POA: Insufficient documentation

## 2023-10-30 DIAGNOSIS — M25562 Pain in left knee: Secondary | ICD-10-CM | POA: Insufficient documentation

## 2023-10-30 DIAGNOSIS — M25561 Pain in right knee: Secondary | ICD-10-CM | POA: Diagnosis present

## 2023-10-30 DIAGNOSIS — M25661 Stiffness of right knee, not elsewhere classified: Secondary | ICD-10-CM | POA: Insufficient documentation

## 2023-10-30 DIAGNOSIS — G8929 Other chronic pain: Secondary | ICD-10-CM | POA: Insufficient documentation

## 2023-11-06 ENCOUNTER — Ambulatory Visit: Admitting: Physical Therapy

## 2023-11-06 DIAGNOSIS — M25561 Pain in right knee: Secondary | ICD-10-CM | POA: Diagnosis not present

## 2023-11-06 DIAGNOSIS — M6281 Muscle weakness (generalized): Secondary | ICD-10-CM

## 2023-11-06 DIAGNOSIS — G8929 Other chronic pain: Secondary | ICD-10-CM

## 2023-11-06 DIAGNOSIS — M25661 Stiffness of right knee, not elsewhere classified: Secondary | ICD-10-CM

## 2023-11-06 DIAGNOSIS — R262 Difficulty in walking, not elsewhere classified: Secondary | ICD-10-CM

## 2023-11-06 DIAGNOSIS — M25662 Stiffness of left knee, not elsewhere classified: Secondary | ICD-10-CM

## 2023-11-06 NOTE — Therapy (Signed)
 OUTPATIENT PHYSICAL THERAPY LOWER EXTREMITY   Patient Name: Tiffany Strickland MRN: [id number] DOB:1955/10/25, 68 y.o., female Today's Date: 11/06/2023  END OF SESSION:  PT End of Session - 11/06/23 1527     Visit Number 2    Number of Visits 17    Date for Recertification  12/25/23    Authorization Type [employer] Dual    Authorization Time Period 10/30/23 to 12/25/23    PT Start Time 1530    PT Stop Time 1615    PT Time Calculation (min) 45 min          Past Medical History:  Diagnosis Date   Colon polyp    adenomatous   Diabetes mellitus without complication (HCC)    Hyperlipidemia    Hypertension    Pulmonary embolism (HCC)    Sleep apnea    Stroke (HCC)    Thyroid  disease    Past Surgical History:  Procedure Laterality Date   ABDOMINAL HYSTERECTOMY     BLADDER SUSPENSION     COLONOSCOPY WITH PROPOFOL  N/A 05/05/2020   Procedure: COLONOSCOPY WITH PROPOFOL ;  Surgeon: Rollin Dover, MD;  Location: [facility];  Service: Endoscopy;  Laterality: N/A;   EUS N/A 08/12/2013   Procedure: LOWER ENDOSCOPIC ULTRASOUND (EUS);  Surgeon: Toribio SHAUNNA Cedar, MD;  Location: [facility];  Service: Endoscopy;  Laterality: N/A;   KNEE SURGERY     POLYPECTOMY  05/05/2020   Procedure: POLYPECTOMY;  Surgeon: Rollin Dover, MD;  Location: [facility];  Service: Endoscopy;;   TUBAL LIGATION     Patient Active Problem List   Diagnosis Date Noted   Acute pulmonary embolism (HCC) 08/16/2023   Acute kidney injury 08/16/2023   Posterior neck pain 05/29/2021   Chronic idiopathic constipation 06/21/2020   Diverticular disease of colon 06/21/2020   Change in bowel habit 03/15/2020   Epigastric pain 03/15/2020   Gastroesophageal reflux disease 03/15/2020   Other specified bacterial intestinal infections 03/15/2020   Pulmonary hypertension (HCC) 06/29/2018   Educated about COVID-19 virus infection 06/29/2018   OSA (obstructive sleep apnea) 02/03/2017   Hypothyroidism 02/02/2017   Chest pain  02/02/2017   Obesity, Class III, BMI 40-49.9 (morbid obesity) 01/27/2017   Plantar fasciitis of left foot 11/08/2015   Non-insulin  treated type 2 diabetes mellitus (HCC) 06/08/2015   Colon cancer screening 06/08/2015   Bilateral leg edema 12/15/2014   Blurry vision, bilateral 06/16/2014   Breast cancer screening 06/16/2014   Other specified hypothyroidism 06/16/2014   Other seasonal allergic rhinitis 06/16/2014   History of stroke 01/31/2014   Rectal nodule 09/06/2013   Essential hypertension 08/24/2013   Nonspecific (abnormal) findings on radiological and other examination of gastrointestinal tract 08/12/2013   Lateral epicondylitis of right elbow 07/21/2013   Right shoulder pain 07/21/2013   Sprain of wrist, right 07/21/2013   Constipation 04/15/2013   Rectal bleeding 04/15/2013    PCP: Rik Glinda DASEN, FNP  REFERRING PROVIDER: Rik Glinda DASEN, FNP  REFERRING DIAG: [phone] (ICD-10-CM) - Pain in left knee  THERAPY DIAG:  Chronic pain of right knee  Chronic pain of left knee  Stiffness of right knee, not elsewhere classified  Stiffness of left knee, not elsewhere classified  Difficulty in walking, not elsewhere classified  Muscle weakness (generalized)  Rationale for Evaluation and Treatment: Rehabilitation  ONSET DATE:  Years off and on   SUBJECTIVE:   SUBJECTIVE STATEMENT:  doing pretty good. Doing HEP. At [facility] every day doing Nustep and leg press. RT ankle is an issue  I've had this issue for years off and on. Both knees hurt when I put pressure on it. I think I did too much one day and that's why I ended up seeing the doctor. Have had therapy for knees in the past but one time a baker's cyst developed and I ended up getting worse.    PERTINENT HISTORY:  See above  PAIN:  Are you having pain? Yes: NPRS scale: 6/10 Pain location: B knees, L worse than R   Pain description: inflamed and sore  Aggravating factors: WB  Relieving factors: elevation, not  WB, heat   PRECAUTIONS: Other: active blood thinners due to recent PE   RED FLAGS: None   WEIGHT BEARING RESTRICTIONS: No  FALLS:  Has patient fallen in last 6 months? Yes. 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  DIAGNOSTIC FINDINGS:   Narrative & Impression CLINICAL DATA:  Chest pain   EXAM: PORTABLE CHEST 1 VIEW   COMPARISON:  Chest x-ray 08/16/2023   FINDINGS: The heart is mildly enlarged. The lungs are clear. There is no pleural effusion or pneumothorax. No acute fractures are seen.   IMPRESSION: Mild cardiomegaly. No acute pulmonary process.  IMPRESSION: 1. No pulmonary embolism. No acute intrathoracic pathology identified. 2. Morphologic changes in keeping with pulmonary arterial hypertension. 3. Mild cardiomegaly. 4. Mild hepatic steatosis.   PATIENT SURVEYS:   LEFS 46/80   COGNITION: Overall cognitive status: Within functional limits for tasks assessed     FLEXIBILITY  Tight hip ER groups, consistent toe out with gait and static positions Tight HS and hip flexors per observation     LOWER EXTREMITY ROM:  Active ROM Right eval Left eval  Hip flexion    Hip extension    Hip abduction    Hip adduction    Hip internal rotation    Hip external rotation    Knee flexion 103* 109*  Knee extension 9* 12*  Ankle dorsiflexion    Ankle plantarflexion    Ankle inversion    Ankle eversion     (Blank  rows = not tested)  LOWER EXTREMITY MMT:  MMT Right eval Left eval  Hip flexion 4+ 4+  Hip extension    Hip abduction 3 3+  Hip adduction    Hip internal rotation    Hip external rotation    Knee flexion 4 4  Knee extension 4 4  Ankle dorsiflexion    Ankle plantarflexion    Ankle inversion    Ankle eversion     (Blank rows = not tested)  TREATMENT DATE:   11/06/23 Nustep L 6 5 min Resisted gait fwd and back 4 x each then laterally 3 x 20# Black bar heel raises  15 x ( attempted toe raise but unable) Red tband DF 2 sets 10 BIL Red tband HS curls 2 sets 10 3# LAQ 2 sets 10 Standing 3# SL hip flex,ext and abd 10 x each STS 10 x from mat     10/30/23  Eval, POC, HEP and education as below    Nustep L4x6 minutes all four extremities, seat 10  HEP practice and discussion    PATIENT EDUCATION:  Education details: exam findings, POC, HEP  Person educated: Patient Education method: Programmer, multimedia, Demonstration, and Handouts Education comprehension: verbalized understanding, returned demonstration, and needs further education  HOME EXERCISE PROGRAM:  Access Code: [email or web address] URL: [email or web address] Date: 10/30/2023 Prepared by: Josette Rough  Exercises - Supine Figure 4 Piriformis Stretch  - 1 x daily - 7 x weekly - 2 sets - 4-6 reps - 30 seconds  hold - Supine Bridge with Resistance Band  - 1 x daily - 7 x weekly - 2 sets - 10 reps - 2 seconds  hold - Clamshell with Resistance  - 1 x daily - 7 x weekly - 2 sets - 10 reps - 1 second  hold - Seated Knee Extension with Anchored Resistance  - 1 x daily - 7 x weekly - 2 sets - 10 reps - 2 seconds  hold  ASSESSMENT:  CLINICAL IMPRESSION: pt arrives feeling okay. Verb doing HEP. States doing Nustep and leg press at home. Pt verb RT ankle is an issue too. Progressive ex today  for LEs and walking ( she walks with short shuffled steps), she did very well with resisted gait. Talked about compression socks and she verb she has some.  Patient is a 68 y.o. F who was seen today for physical therapy evaluation and treatment for M25.562 (ICD-10-CM) - Pain in left knee. Objectives as above. Has recent history of PE due to sedentary period, also has history of Baker's cyst and limited success in PT in the past due to this. Will make every effort to improve pain and function moving forward.  OBJECTIVE IMPAIRMENTS: Abnormal gait, decreased activity tolerance, decreased mobility, difficulty walking, decreased ROM, decreased strength, impaired flexibility, obesity, and pain.   ACTIVITY LIMITATIONS: standing, squatting, stairs, transfers, and locomotion level  PARTICIPATION LIMITATIONS: meal prep, cleaning, laundry, driving, shopping, community activity, and yard work  PERSONAL FACTORS: Age, Behavior pattern, Education, Fitness, Past/current experiences, Social background, and Time since onset of injury/illness/exacerbation are also affecting patient's functional outcome.   REHAB POTENTIAL: Fair body habitus, chronicity of pain, limited success with PT in the past   CLINICAL DECISION MAKING: Stable/uncomplicated  EVALUATION COMPLEXITY: Low   GOALS: Goals reviewed with patient? No  SHORT TERM GOALS: Target date: 11/27/2023   Will be compliant with appropriate progressive HEP Goal status: 11/06/23 MET for initial HEP   2. Knee AROM flexion to be at least 115* B and extension AROM to be no more than 5* B  Goal status: initial   3. Will be independent with BLE edema management strategies  Goal status: initial    4. Gait pattern to have normalized to include consistent step through pattern with good heel-toe mechanics, no toe out with gait Goal status: initial    LONG TERM GOALS: Target date: 12/25/2023    MMT to have improved by one grade all weak groups Goal  status: initial  2. Will score at least 45 on Berg to show reduced fall risk  Goal status: initial    3. Will be able to ascend and descend stairs reciprocally with no increase in pain Goal status: initial   4. Will be able to ambulate community distances and perform all household tasks with no increase in pain  Goal status: initial   5. LEFS to have improved by at least 10 points to show improved QOL and subjective improvement     PLAN:  PT FREQUENCY: 2x/week  PT DURATION: 8 weeks  PLANNED INTERVENTIONS: 97750- Physical Performance Testing, 97110-Therapeutic exercises, 97530- Therapeutic activity, 97112- Neuromuscular re-education, 97535- Self Care, 02859- Manual therapy, [phone]- Gait training, [phone]- Aquatic Therapy, [phone]- Vasopneumatic device, and Patient/Family education  PLAN FOR NEXT SESSION: assess response to todays session-knee ROM, gross strengthening, discuss compression stockings to reduce BLE edema, could consider water PT if she makes limited progress on land   [place] Emmaleigh Longo PTA 11/06/23 3:28 PM

## 2023-11-14 ENCOUNTER — Ambulatory Visit: Attending: Family | Admitting: Physical Therapy

## 2023-11-14 ENCOUNTER — Encounter: Payer: Self-pay | Admitting: Physical Therapy

## 2023-11-14 DIAGNOSIS — M25562 Pain in left knee: Secondary | ICD-10-CM | POA: Diagnosis present

## 2023-11-14 DIAGNOSIS — M25561 Pain in right knee: Secondary | ICD-10-CM | POA: Insufficient documentation

## 2023-11-14 DIAGNOSIS — M25662 Stiffness of left knee, not elsewhere classified: Secondary | ICD-10-CM | POA: Insufficient documentation

## 2023-11-14 DIAGNOSIS — M6281 Muscle weakness (generalized): Secondary | ICD-10-CM | POA: Insufficient documentation

## 2023-11-14 DIAGNOSIS — R262 Difficulty in walking, not elsewhere classified: Secondary | ICD-10-CM | POA: Diagnosis present

## 2023-11-14 DIAGNOSIS — G8929 Other chronic pain: Secondary | ICD-10-CM | POA: Diagnosis present

## 2023-11-14 DIAGNOSIS — M25661 Stiffness of right knee, not elsewhere classified: Secondary | ICD-10-CM | POA: Insufficient documentation

## 2023-11-14 NOTE — Therapy (Signed)
 OUTPATIENT PHYSICAL THERAPY LOWER EXTREMITY   Patient Name: Tiffany Strickland MRN: [id number] DOB:[birth year], 68 y.o., female Today's Date: 11/14/2023  END OF SESSION:  PT End of Session - 11/14/23 0928     Visit Number 3    Date for Recertification  12/25/23    PT Start Time 0928    PT Stop Time 1013    PT Time Calculation (min) 45 min    Activity Tolerance Patient tolerated treatment well    Behavior During Therapy [facility] for tasks assessed/performed          Past Medical History:  Diagnosis Date   Colon polyp    adenomatous   Diabetes mellitus without complication (HCC)    Hyperlipidemia    Hypertension    Pulmonary embolism (HCC)    Sleep apnea    Stroke (HCC)    Thyroid  disease    Past Surgical History:  Procedure Laterality Date   ABDOMINAL HYSTERECTOMY     BLADDER SUSPENSION     COLONOSCOPY WITH PROPOFOL  N/A 05/05/2020   Procedure: COLONOSCOPY WITH PROPOFOL ;  Surgeon: Rollin Dover, MD;  Location: [facility];  Service: Endoscopy;  Laterality: N/A;   EUS N/A 08/12/2013   Procedure: LOWER ENDOSCOPIC ULTRASOUND (EUS);  Surgeon: Toribio SHAUNNA Cedar, MD;  Location: [facility];  Service: Endoscopy;  Laterality: N/A;   KNEE SURGERY     POLYPECTOMY  05/05/2020   Procedure: POLYPECTOMY;  Surgeon: Rollin Dover, MD;  Location: [facility];  Service: Endoscopy;;   TUBAL LIGATION     Patient Active Problem List   Diagnosis Date Noted   Acute pulmonary embolism (HCC) 08/16/2023   Acute kidney injury 08/16/2023   Posterior neck pain 05/29/2021   Chronic idiopathic constipation 06/21/2020   Diverticular disease of colon 06/21/2020   Change in bowel habit 03/15/2020   Epigastric pain 03/15/2020   Gastroesophageal reflux disease 03/15/2020   Other specified bacterial intestinal infections 03/15/2020   Pulmonary hypertension (HCC) 06/29/2018   Educated about COVID-19 virus infection 06/29/2018   OSA (obstructive sleep apnea) 02/03/2017   Hypothyroidism 02/02/2017    Chest pain 02/02/2017   Obesity, Class III, BMI 40-49.9 (morbid obesity) (HCC) 01/27/2017   Plantar fasciitis of left foot 11/08/2015   Non-insulin  treated type 2 diabetes mellitus (HCC) 06/08/2015   Colon cancer screening 06/08/2015   Bilateral leg edema 12/15/2014   Blurry vision, bilateral 06/16/2014   Breast cancer screening 06/16/2014   Other specified hypothyroidism 06/16/2014   Other seasonal allergic rhinitis 06/16/2014   History of stroke 01/31/2014   Rectal nodule 09/06/2013   Essential hypertension 08/24/2013   Nonspecific (abnormal) findings on radiological and other examination of gastrointestinal tract 08/12/2013   Lateral epicondylitis of right elbow 07/21/2013   Right shoulder pain 07/21/2013   Sprain of wrist, right 07/21/2013   Constipation 04/15/2013   Rectal bleeding 04/15/2013    PCP: Rik Glinda DASEN, FNP  REFERRING PROVIDER: Rik Glinda DASEN, FNP  REFERRING DIAG: [phone] (ICD-10-CM) - Pain in left knee  THERAPY DIAG:  Chronic pain of right knee  Chronic pain of left knee  Stiffness of right knee, not elsewhere classified  Difficulty in walking, not elsewhere classified  Muscle weakness (generalized)  Stiffness of left knee, not elsewhere classified  Rationale for Evaluation and Treatment: Rehabilitation  ONSET DATE:  Years off and on   SUBJECTIVE:   SUBJECTIVE STATEMENT:  I  feel good Ankle is mote troublesome than knee     I've had this issue for years off and on. Both  knees hurt when I put pressure on it. I think I did too much one day and that's why I ended up seeing the doctor. Have had therapy for knees in the past but one time a baker's cyst developed and I ended up getting worse.    PERTINENT HISTORY:  See above  PAIN:  Are you having pain? Yes: NPRS scale: 0/10 Pain location: B knees, L worse than R   Pain description: inflamed and sore  Aggravating factors: WB  Relieving factors: elevation, not WB, heat   PRECAUTIONS:  Other: active blood thinners due to recent PE   RED FLAGS: None   WEIGHT BEARING RESTRICTIONS: No  FALLS:  Has patient fallen in last 6 months? Yes. 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  DIAGNOSTIC FINDINGS:   Narrative & Impression CLINICAL DATA:  Chest pain   EXAM: PORTABLE CHEST 1 VIEW   COMPARISON:  Chest x-ray 08/16/2023   FINDINGS: The heart is mildly enlarged. The lungs are clear. There is no pleural effusion or pneumothorax. No acute fractures are seen.   IMPRESSION: Mild cardiomegaly. No acute pulmonary process.  IMPRESSION: 1. No pulmonary embolism. No acute intrathoracic pathology identified. 2. Morphologic changes in keeping with pulmonary arterial hypertension. 3. Mild cardiomegaly. 4. Mild hepatic steatosis.   PATIENT SURVEYS:   LEFS 46/80   COGNITION: Overall cognitive status: Within functional limits for tasks assessed     FLEXIBILITY  Tight hip ER groups, consistent toe out with gait and static positions Tight HS and hip flexors per observation     LOWER EXTREMITY ROM:  Active ROM Right eval Left eval Right AROM 11/14/23 Left AROM 11/14/23  Hip flexion      Hip extension      Hip abduction      Hip adduction      Hip internal rotation      Hip external rotation      Knee flexion 103* 109* 102 100  Knee extension 9* 12* 3 0  Ankle dorsiflexion      Ankle plantarflexion      Ankle  inversion      Ankle eversion       (Blank rows = not tested)  LOWER EXTREMITY MMT:  MMT Right eval Left eval Right 11/14/23 Left 11/14/23  Hip flexion 4+ 4+ 5 5  Hip extension      Hip abduction 3 3+ 5 5  Hip adduction      Hip internal rotation      Hip external rotation      Knee flexion 4 4 4+ 4  Knee extension 4 4 4+ 4+  Ankle dorsiflexion      Ankle plantarflexion      Ankle inversion      Ankle eversion       (Blank rows = not tested)  TREATMENT DATE:  11/14/23 NuStep L5 x 6 min PROM to bilateral knees  Patellar mobs (difficulty relaxing) Sit to stands 2x10 6 in step ups 2x5 30lb resisted side steps x5 each   11/06/23 Nustep L 6 5 min Resisted gait fwd and back 4 x each then laterally 3 x 20# Black bar heel raises  15 x ( attempted toe raise but unable) Red tband DF 2 sets 10 BIL Red tband HS curls 2 sets 10 3# LAQ 2 sets 10 Standing 3# SL hip flex,ext and abd 10 x each STS 10 x from mat     10/30/23  Eval, POC, HEP and education as below    Nustep L4x6 minutes all four extremities, seat 10  HEP practice and discussion    PATIENT EDUCATION:  Education details: exam findings, POC, HEP  Person educated: Patient Education method: Programmer, multimedia, Demonstration, and Handouts Education comprehension: verbalized understanding, returned demonstration, and needs further education  HOME EXERCISE PROGRAM:  Access Code: [email or web address] URL: [email or web address] Date: 10/30/2023 Prepared by: Josette Rough  Exercises - Supine Figure 4 Piriformis Stretch  - 1 x daily - 7 x weekly - 2 sets - 4-6 reps - 30 seconds  hold - Supine Bridge with Resistance Band  - 1 x daily - 7 x weekly - 2 sets - 10 reps - 2 seconds  hold - Clamshell with Resistance  - 1 x daily - 7 x weekly - 2 sets - 10 reps - 1 second  hold - Seated Knee  Extension with Anchored Resistance  - 1 x daily - 7 x weekly - 2 sets - 10 reps - 2 seconds  hold  ASSESSMENT:  CLINICAL IMPRESSION: [facility] No progress has been made with LE AROM but she has improved increasing her HS and quad strength. Weakness ad fatigue present with step ups.  Cues to prevent excessive external rotation with LE doing sit to stands.  Patient is a 68 y.o. F who was seen today for physical therapy evaluation and treatment for M25.562 (ICD-10-CM) - Pain in left knee. Objectives as above. Has recent history of PE due to sedentary period, also has history of Baker's cyst and limited success in PT in the past due to this. Will make every effort to improve pain and function moving forward.  OBJECTIVE IMPAIRMENTS: Abnormal gait, decreased activity tolerance, decreased mobility, difficulty walking, decreased ROM, decreased strength, impaired flexibility, obesity, and pain.   ACTIVITY LIMITATIONS: standing, squatting, stairs, transfers, and locomotion level  PARTICIPATION LIMITATIONS: meal prep, cleaning, laundry, driving, shopping, community activity, and yard work  PERSONAL FACTORS: Age, Behavior pattern, Education, Fitness, Past/current experiences, Social background, and Time since onset of injury/illness/exacerbation are also affecting patient's functional outcome.   REHAB POTENTIAL: Fair body habitus, chronicity of pain, limited success with PT in the past   CLINICAL DECISION MAKING: Stable/uncomplicated  EVALUATION COMPLEXITY: Low   GOALS: Goals reviewed with patient? No  SHORT TERM GOALS: Target date: 11/27/2023   Will be compliant with appropriate progressive HEP Goal status: 11/06/23 MET for initial HEP   2. Knee AROM flexion to be at least 115* B and extension AROM to be no more than 5* B  Goal status: Ongoing 11/14/23   3. Will be independent with BLE edema management strategies  Goal status: initial    4. Gait  pattern to have normalized to include consistent step through pattern with good heel-toe mechanics, no toe out with gait Goal status: Progressing  11/14/23    LONG TERM GOALS: Target date: 12/25/2023    MMT to have improved by one grade all weak groups Goal status: Progressing 11/14/23   2. Will score at least 45 on Berg to show reduced fall risk  Goal status: initial    3. Will be able to ascend and descend stairs reciprocally with no increase in pain Goal status: initial   4. Will be able to ambulate community distances and perform all household tasks with no increase in pain  Goal status: initial   5. LEFS to have improved by at least 10 points to show improved QOL and subjective improvement     PLAN:  PT FREQUENCY: 2x/week  PT DURATION: 8 weeks  PLANNED INTERVENTIONS: 97750- Physical Performance Testing, 97110-Therapeutic exercises, 97530- Therapeutic activity, 97112- Neuromuscular re-education, 97535- Self Care, 02859- Manual therapy, [phone]- Gait training, [phone]- Aquatic Therapy, [phone]- Vasopneumatic device, and Patient/Family education  PLAN FOR NEXT SESSION: assess response to todays session-knee ROM, gross strengthening, discuss compression stockings to reduce BLE edema, could consider water PT if she makes limited progress on land   [place] Payseur PTA 11/14/23 9:28 AM

## 2023-11-18 ENCOUNTER — Ambulatory Visit: Admitting: Physical Therapy

## 2023-11-21 ENCOUNTER — Ambulatory Visit: Admitting: Physical Therapy

## 2023-11-21 DIAGNOSIS — M6281 Muscle weakness (generalized): Secondary | ICD-10-CM

## 2023-11-21 DIAGNOSIS — G8929 Other chronic pain: Secondary | ICD-10-CM

## 2023-11-21 DIAGNOSIS — R262 Difficulty in walking, not elsewhere classified: Secondary | ICD-10-CM

## 2023-11-21 DIAGNOSIS — M25662 Stiffness of left knee, not elsewhere classified: Secondary | ICD-10-CM

## 2023-11-21 DIAGNOSIS — M25561 Pain in right knee: Secondary | ICD-10-CM | POA: Diagnosis not present

## 2023-11-21 DIAGNOSIS — M25661 Stiffness of right knee, not elsewhere classified: Secondary | ICD-10-CM

## 2023-11-21 NOTE — Therapy (Signed)
 OUTPATIENT PHYSICAL THERAPY LOWER EXTREMITY   Patient Name: Tiffany Strickland MRN: [id number] DOB:06-Sep-1955, 68 y.o., female Today's Date: 11/21/2023  END OF SESSION:  PT End of Session - 11/21/23 0847     Visit Number 4    Number of Visits 17    Date for Recertification  12/25/23    PT Start Time 0847    PT Stop Time 0930    PT Time Calculation (min) 43 min          Past Medical History:  Diagnosis Date   Colon polyp    adenomatous   Diabetes mellitus without complication (HCC)    Hyperlipidemia    Hypertension    Pulmonary embolism (HCC)    Sleep apnea    Stroke (HCC)    Thyroid  disease    Past Surgical History:  Procedure Laterality Date   ABDOMINAL HYSTERECTOMY     BLADDER SUSPENSION     COLONOSCOPY WITH PROPOFOL  N/A 05/05/2020   Procedure: COLONOSCOPY WITH PROPOFOL ;  Surgeon: Rollin Dover, MD;  Location: [facility];  Service: Endoscopy;  Laterality: N/A;   EUS N/A 08/12/2013   Procedure: LOWER ENDOSCOPIC ULTRASOUND (EUS);  Surgeon: Toribio SHAUNNA Cedar, MD;  Location: [facility];  Service: Endoscopy;  Laterality: N/A;   KNEE SURGERY     POLYPECTOMY  05/05/2020   Procedure: POLYPECTOMY;  Surgeon: Rollin Dover, MD;  Location: [facility];  Service: Endoscopy;;   TUBAL LIGATION     Patient Active Problem List   Diagnosis Date Noted   Acute pulmonary embolism (HCC) 08/16/2023   Acute kidney injury 08/16/2023   Posterior neck pain 05/29/2021   Chronic idiopathic constipation 06/21/2020   Diverticular disease of colon 06/21/2020   Change in bowel habit 03/15/2020   Epigastric pain 03/15/2020   Gastroesophageal reflux disease 03/15/2020   Other specified bacterial intestinal infections 03/15/2020   Pulmonary hypertension (HCC) 06/29/2018   Educated about COVID-19 virus infection 06/29/2018   OSA (obstructive sleep apnea) 02/03/2017   Hypothyroidism 02/02/2017   Chest pain 02/02/2017   Obesity, Class III, BMI 40-49.9 (morbid obesity) (HCC) 01/27/2017    Plantar fasciitis of left foot 11/08/2015   Non-insulin  treated type 2 diabetes mellitus (HCC) 06/08/2015   Colon cancer screening 06/08/2015   Bilateral leg edema 12/15/2014   Blurry vision, bilateral 06/16/2014   Breast cancer screening 06/16/2014   Other specified hypothyroidism 06/16/2014   Other seasonal allergic rhinitis 06/16/2014   History of stroke 01/31/2014   Rectal nodule 09/06/2013   Essential hypertension 08/24/2013   Nonspecific (abnormal) findings on radiological and other examination of gastrointestinal tract 08/12/2013   Lateral epicondylitis of right elbow 07/21/2013   Right shoulder pain 07/21/2013   Sprain of wrist, right 07/21/2013   Constipation 04/15/2013   Rectal bleeding 04/15/2013    PCP: Rik Glinda DASEN, FNP  REFERRING PROVIDER: Rik Glinda DASEN, FNP  REFERRING DIAG: [phone] (ICD-10-CM) - Pain in left knee  THERAPY DIAG:  Chronic pain of right knee  Chronic pain of left knee  Stiffness of right knee, not elsewhere classified  Difficulty in walking, not elsewhere classified  Muscle weakness (generalized)  Stiffness of left knee, not elsewhere classified  Rationale for Evaluation and Treatment: Rehabilitation  ONSET DATE:  Years off and on   SUBJECTIVE:   SUBJECTIVE STATEMENT:  tired at ER with daughter. Ankle alittle sore    I've had this issue for years off and on. Both knees hurt when I put pressure on it. I think I did too much one  day and that's why I ended up seeing the doctor. Have had therapy for knees in the past but one time a baker's cyst developed and I ended up getting worse.    PERTINENT HISTORY:  See above  PAIN:  Are you having pain? Knees 3/10. Left ankle 7-8/10  PRECAUTIONS: Other: active blood thinners due to recent PE   RED FLAGS: None   WEIGHT BEARING RESTRICTIONS: No  FALLS:  Has patient fallen in last 6 months? Yes. 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  DIAGNOSTIC FINDINGS:   Narrative & Impression CLINICAL DATA:  Chest pain   EXAM: PORTABLE CHEST 1 VIEW   COMPARISON:  Chest x-ray 08/16/2023   FINDINGS: The heart is mildly enlarged. The lungs are clear. There is no pleural effusion or pneumothorax. No acute fractures are seen.   IMPRESSION: Mild cardiomegaly. No acute pulmonary process.  IMPRESSION: 1. No pulmonary embolism. No acute intrathoracic pathology identified. 2. Morphologic changes in keeping with pulmonary arterial hypertension. 3. Mild cardiomegaly. 4. Mild hepatic steatosis.   PATIENT SURVEYS:   LEFS 46/80   COGNITION: Overall cognitive status: Within functional limits for tasks assessed     FLEXIBILITY  Tight hip ER groups, consistent toe out with gait and static positions Tight HS and hip flexors per observation     LOWER EXTREMITY ROM:  Active ROM Right eval Left eval Right AROM 11/14/23 Left AROM 11/14/23  Hip flexion      Hip extension      Hip abduction      Hip adduction      Hip internal rotation      Hip external rotation      Knee flexion 103* 109* 102 100  Knee extension 9* 12* 3 0  Ankle dorsiflexion      Ankle plantarflexion      Ankle inversion      Ankle eversion       (Blank rows = not tested)  LOWER EXTREMITY MMT:  MMT Right eval Left eval Right 11/14/23 Left 11/14/23  Hip flexion 4+ 4+ 5 5  Hip extension      Hip abduction 3 3+ 5 5  Hip adduction      Hip  internal rotation      Hip external rotation      Knee flexion 4 4 4+ 4  Knee extension 4 4 4+ 4+  Ankle dorsiflexion      Ankle plantarflexion      Ankle inversion      Ankle eversion       (Blank rows = not tested)  TREATMENT DATE:   11/21/23 AROM Left knee  0-105 AROM RT  knee 0-102 Goals assessed LAQ 3# 2 sets 10 Standing with UE support 3# 12 x HS curl, hip ext,hip abd and hip flexion 3# marching fwd and walking backward 10 feet 5 x each 3# side stepping 5 x 10 feet Leg Press 40# 2 sets 10. Calf raises 2 sets 10 STS with wt ball 2 sets 10 Resisted gait 30# 4 ways 5 x each      11/14/23 NuStep L5 x 6 min PROM to bilateral knees  Patellar mobs (difficulty relaxing) Sit to stands 2x10 6 in step ups 2x5 30lb resisted side steps x5 each   11/06/23 Nustep L 6 5 min Resisted gait fwd and back 4 x each then laterally 3 x 20# Black bar heel raises  15 x ( attempted toe raise but unable) Red tband DF 2 sets 10 BIL Red tband HS curls 2 sets 10 3# LAQ 2 sets 10 Standing 3# SL hip flex,ext and abd 10 x each STS 10 x from mat     10/30/23  Eval, POC, HEP and education as below    Nustep L4x6 minutes all four extremities, seat 10  HEP practice and discussion    PATIENT EDUCATION:  Education details: exam findings, POC, HEP  Person educated: Patient Education method: Programmer, multimedia, Demonstration, and Handouts Education comprehension: verbalized understanding, returned demonstration, and needs further education  HOME EXERCISE PROGRAM:  Access Code: [email or web address] URL: [email or web address] Date: 10/30/2023 Prepared by: Josette Rough  Exercises - Supine Figure 4 Piriformis Stretch  - 1 x daily - 7 x weekly - 2 sets - 4-6 reps - 30 seconds  hold - Supine Bridge with Resistance Band  - 1 x daily - 7 x weekly - 2 sets - 10  reps - 2 seconds  hold - Clamshell with Resistance  - 1 x daily - 7 x weekly - 2 sets - 10 reps - 1 second  hold - Seated Knee Extension with Anchored Resistance  - 1 x daily - 7 x weekly - 2 sets - 10 reps - 2 seconds  hold  ASSESSMENT:  CLINICAL IMPRESSION: assessed and documented goals. Making progress. Pt with 3/10 BIL knee pain but left ankle 7-8/10 feels from shoes in past, been looked at per pt but found nothing. Progressing ex for LE strength and tolerated well with some cuing and seated rest as needed Patient is a 68 y.o. F who was seen today for physical therapy evaluation and treatment for M25.562 (ICD-10-CM) - Pain in left knee. Objectives as above. Has recent history of PE due to sedentary period, also has history of Baker's cyst and limited success in PT in the past due to this. Will make every effort to improve pain and function moving forward.  OBJECTIVE IMPAIRMENTS: Abnormal gait, decreased activity tolerance, decreased mobility, difficulty walking, decreased ROM, decreased strength, impaired flexibility, obesity, and pain.   ACTIVITY LIMITATIONS: standing, squatting, stairs, transfers, and locomotion level  PARTICIPATION LIMITATIONS: meal prep, cleaning, laundry, driving, shopping, community activity, and yard work  PERSONAL FACTORS: Age, Behavior pattern, Education, Fitness, Past/current experiences, Social background, and Time since onset of injury/illness/exacerbation are also affecting patient's functional outcome.   REHAB POTENTIAL: Fair body habitus, chronicity of pain, limited success with PT in the past   CLINICAL DECISION MAKING: Stable/uncomplicated  EVALUATION COMPLEXITY: Low   GOALS: Goals reviewed with patient? No  SHORT TERM GOALS: Target date: 11/27/2023   Will be compliant with appropriate  progressive HEP Goal status: 11/06/23 MET for initial HEP   2. Knee AROM flexion to be at least 115* B and extension AROM to be no more than 5* B  Goal status:  Ongoing 11/14/23  and 11/21/23   3. Will be independent with BLE edema management strategies  Goal status: progressing 11/21/23   4. Gait pattern to have normalized to include consistent step through pattern with good heel-toe mechanics, no toe out with gait Goal status: Progressing 11/14/23    LONG TERM GOALS: Target date: 12/25/2023    MMT to have improved by one grade all weak groups Goal status: Progressing 11/14/23   2. Will score at least 45 on Berg to show reduced fall risk  Goal status: initial    3. Will be able to ascend and descend stairs reciprocally with no increase in pain Goal status: initial   4. Will be able to ambulate community distances and perform all household tasks with no increase in pain  Goal status: initial   5. LEFS to have improved by at least 10 points to show improved QOL and subjective improvement     PLAN:  PT FREQUENCY: 2x/week  PT DURATION: 8 weeks  PLANNED INTERVENTIONS: 97750- Physical Performance Testing, 97110-Therapeutic exercises, 97530- Therapeutic activity, [id number]- Neuromuscular re-education, 97535- Self Care, 02859- Manual therapy, [phone]- Gait training, [phone]- Aquatic Therapy, [phone]- Vasopneumatic device, and Patient/Family education  PLAN FOR NEXT SESSION: progress strength and func gait   Jon Vertie Dibbern PTA 11/21/23 8:48 AM

## 2023-11-25 ENCOUNTER — Ambulatory Visit: Admitting: Physical Therapy

## 2023-11-25 DIAGNOSIS — M6281 Muscle weakness (generalized): Secondary | ICD-10-CM

## 2023-11-25 DIAGNOSIS — R262 Difficulty in walking, not elsewhere classified: Secondary | ICD-10-CM

## 2023-11-25 DIAGNOSIS — M25561 Pain in right knee: Secondary | ICD-10-CM | POA: Diagnosis not present

## 2023-11-25 DIAGNOSIS — M25661 Stiffness of right knee, not elsewhere classified: Secondary | ICD-10-CM

## 2023-11-25 DIAGNOSIS — M25662 Stiffness of left knee, not elsewhere classified: Secondary | ICD-10-CM

## 2023-11-25 DIAGNOSIS — G8929 Other chronic pain: Secondary | ICD-10-CM

## 2023-11-25 NOTE — Therapy (Signed)
 OUTPATIENT PHYSICAL THERAPY LOWER EXTREMITY   Patient Name: Tiffany Strickland MRN: [id number] DOB:Jun 18, 1955, 68 y.o., female Today's Date: 11/25/2023  END OF SESSION:  PT End of Session - 11/25/23 0844     Visit Number 5    Number of Visits 17    Date for Recertification  12/25/23    Authorization Type [employer] Dual    Authorization Time Period 10/30/23 to 12/25/23    PT Start Time 0845    PT Stop Time 0930    PT Time Calculation (min) 45 min          Past Medical History:  Diagnosis Date   Colon polyp    adenomatous   Diabetes mellitus without complication (HCC)    Hyperlipidemia    Hypertension    Pulmonary embolism (HCC)    Sleep apnea    Stroke (HCC)    Thyroid  disease    Past Surgical History:  Procedure Laterality Date   ABDOMINAL HYSTERECTOMY     BLADDER SUSPENSION     COLONOSCOPY WITH PROPOFOL  N/A 05/05/2020   Procedure: COLONOSCOPY WITH PROPOFOL ;  Surgeon: Rollin Dover, MD;  Location: [facility];  Service: Endoscopy;  Laterality: N/A;   EUS N/A 08/12/2013   Procedure: LOWER ENDOSCOPIC ULTRASOUND (EUS);  Surgeon: Toribio SHAUNNA Cedar, MD;  Location: [facility];  Service: Endoscopy;  Laterality: N/A;   KNEE SURGERY     POLYPECTOMY  05/05/2020   Procedure: POLYPECTOMY;  Surgeon: Rollin Dover, MD;  Location: [facility];  Service: Endoscopy;;   TUBAL LIGATION     Patient Active Problem List   Diagnosis Date Noted   Acute pulmonary embolism (HCC) 08/16/2023   Acute kidney injury 08/16/2023   Posterior neck pain 05/29/2021   Chronic idiopathic constipation 06/21/2020   Diverticular disease of colon 06/21/2020   Change in bowel habit 03/15/2020   Epigastric pain 03/15/2020   Gastroesophageal reflux disease 03/15/2020   Other specified bacterial intestinal infections 03/15/2020   Pulmonary hypertension (HCC) 06/29/2018   Educated about COVID-19 virus infection 06/29/2018   OSA (obstructive sleep apnea) 02/03/2017   Hypothyroidism 02/02/2017   Chest pain  02/02/2017   Obesity, Class III, BMI 40-49.9 (morbid obesity) (HCC) 01/27/2017   Plantar fasciitis of left foot 11/08/2015   Non-insulin  treated type 2 diabetes mellitus (HCC) 06/08/2015   Colon cancer screening 06/08/2015   Bilateral leg edema 12/15/2014   Blurry vision, bilateral 06/16/2014   Breast cancer screening 06/16/2014   Other specified hypothyroidism 06/16/2014   Other seasonal allergic rhinitis 06/16/2014   History of stroke 01/31/2014   Rectal nodule 09/06/2013   Essential hypertension 08/24/2013   Nonspecific (abnormal) findings on radiological and other examination of gastrointestinal tract 08/12/2013   Lateral epicondylitis of right elbow 07/21/2013   Right shoulder pain 07/21/2013   Sprain of wrist, right 07/21/2013   Constipation 04/15/2013   Rectal bleeding 04/15/2013    PCP: Rik Glinda DASEN, FNP  REFERRING PROVIDER: Rik Glinda DASEN, FNP  REFERRING DIAG: [phone] (ICD-10-CM) - Pain in left knee  THERAPY DIAG:  Chronic pain of right knee  Chronic pain of left knee  Stiffness of right knee, not elsewhere classified  Difficulty in walking, not elsewhere classified  Muscle weakness (generalized)  Stiffness of left knee, not elsewhere classified  Rationale for Evaluation and Treatment: Rehabilitation  ONSET DATE:  Years off and on   SUBJECTIVE:   SUBJECTIVE STATEMENT:  okay, flu shot yesterday    I've had this issue for years off and on. Both knees hurt when  I put pressure on it. I think I did too much one day and that's why I ended up seeing the doctor. Have had therapy for knees in the past but one time a baker's cyst developed and I ended up getting worse.    PERTINENT HISTORY:  See above  PAIN:  Are you having pain? Knees 3/10. Left ankle 7-8/10  PRECAUTIONS: Other: active blood thinners due to recent PE   RED FLAGS: None   WEIGHT BEARING RESTRICTIONS: No  FALLS:  Has patient fallen in last 6 months? Yes. 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  DIAGNOSTIC FINDINGS:   Narrative & Impression CLINICAL DATA:  Chest pain   EXAM: PORTABLE CHEST 1 VIEW   COMPARISON:  Chest x-ray 08/16/2023   FINDINGS: The heart is mildly enlarged. The lungs are clear. There is no pleural effusion or pneumothorax. No acute fractures are seen.   IMPRESSION: Mild cardiomegaly. No acute pulmonary process.  IMPRESSION: 1. No pulmonary embolism. No acute intrathoracic pathology identified. 2. Morphologic changes in keeping with pulmonary arterial hypertension. 3. Mild cardiomegaly. 4. Mild hepatic steatosis.   PATIENT SURVEYS:   LEFS 46/80   COGNITION: Overall cognitive status: Within functional limits for tasks assessed     FLEXIBILITY  Tight hip ER groups, consistent toe out with gait and static positions Tight HS and hip flexors per observation     LOWER EXTREMITY ROM:  Active ROM Right eval Left eval Right AROM 11/14/23 Left AROM 11/14/23  Hip flexion      Hip extension      Hip abduction      Hip adduction      Hip internal rotation      Hip external rotation      Knee flexion 103* 109* 102 100  Knee extension 9* 12* 3 0  Ankle dorsiflexion      Ankle plantarflexion      Ankle inversion      Ankle eversion       (Blank rows = not tested)  LOWER EXTREMITY MMT:  MMT Right eval Left eval Right 11/14/23 Left 11/14/23  Hip flexion 4+ 4+ 5 5  Hip extension       Hip abduction 3 3+ 5 5  Hip adduction      Hip internal rotation      Hip external rotation      Knee flexion 4 4 4+ 4  Knee extension 4 4 4+ 4+  Ankle dorsiflexion      Ankle plantarflexion      Ankle inversion      Ankle eversion       (Blank rows = not tested)  TREATMENT DATE:   11/25/23 Resisted gait 40# 4 ways 5 x each Black bar heel raises 2 sets 10 Knee ext 10 # 2 sets 10 HS curl 25# 2 sets 10 6 inch step up 10 x BIL the side step up 10 x BIL- with UE support Seated blue tband hip flex and abd 2 sets 10 STS 10 x with wt ball 10 x 2 sets Nustep L 5  LE only    11/21/23 AROM Left knee  0-105 AROM RT  knee 0-102 Goals assessed LAQ 3# 2 sets 10 Standing with UE support 3# 12 x HS curl, hip ext,hip abd and hip flexion 3# marching fwd and walking backward 10 feet 5 x each 3# side stepping 5 x 10 feet Leg Press 40# 2 sets 10. Calf raises 2 sets 10 STS with wt ball 2 sets 10 Resisted gait 30# 4 ways 5 x each      11/14/23 NuStep L5 x 6 min PROM to bilateral knees  Patellar mobs (difficulty relaxing) Sit to stands 2x10 6 in step ups 2x5 30lb resisted side steps x5 each   11/06/23 Nustep L 6 5 min Resisted gait fwd and back 4 x each then laterally 3 x 20# Black bar heel raises  15 x ( attempted toe raise but unable) Red tband DF 2 sets 10 BIL Red tband HS curls 2 sets 10 3# LAQ 2 sets 10 Standing 3# SL hip flex,ext and abd 10 x each STS 10 x from mat     10/30/23  Eval, POC, HEP and education as below    Nustep L4x6 minutes all four extremities, seat 10  HEP practice and discussion    PATIENT EDUCATION:  Education details: exam findings, POC, HEP  Person educated: Patient Education method: Programmer, multimedia, Demonstration, and Handouts Education comprehension: verbalized understanding, returned demonstration,  and needs further education  HOME EXERCISE PROGRAM:  Access Code: [email or web address] URL: [email or web address] Date: 10/30/2023 Prepared by: Josette Rough  Exercises - Supine Figure 4 Piriformis Stretch  - 1 x daily - 7 x weekly - 2 sets - 4-6 reps - 30 seconds  hold - Supine Bridge with Resistance Band  - 1 x daily - 7 x weekly - 2 sets - 10 reps - 2 seconds  hold - Clamshell with Resistance  - 1 x daily - 7 x weekly - 2 sets - 10 reps - 1 second  hold - Seated Knee Extension with Anchored Resistance  - 1 x daily - 7 x weekly - 2 sets - 10 reps - 2 seconds  hold  ASSESSMENT:  CLINICAL IMPRESSION: progressed LE strengthening with cuing for good speed and control pf mvmt. PNT tolerated well. C/O weakness in BIL quad and hip flexor  ( this is where is pointed and stated point of weakness)   Patient is a 68 y.o. F who was seen today for physical therapy evaluation and treatment for M25.562 (ICD-10-CM) - Pain in left knee. Objectives as above. Has recent history of PE due to sedentary period, also has history of Baker's cyst and limited success in PT in the past due to this. Will make every effort to improve pain and function moving forward.  OBJECTIVE IMPAIRMENTS: Abnormal gait, decreased activity tolerance, decreased mobility, difficulty walking, decreased ROM, decreased strength, impaired flexibility, obesity, and pain.   ACTIVITY LIMITATIONS: standing, squatting, stairs, transfers, and locomotion level  PARTICIPATION LIMITATIONS: meal prep, cleaning, laundry, driving, shopping, community activity, and yard work  PERSONAL FACTORS: Age, Behavior  pattern, Education, Fitness, Past/current experiences, Social background, and Time since onset of injury/illness/exacerbation are also affecting patient's functional outcome.   REHAB POTENTIAL: Fair body habitus, chronicity of pain, limited success with PT in the past   CLINICAL DECISION MAKING: Stable/uncomplicated  EVALUATION  COMPLEXITY: Low   GOALS: Goals reviewed with patient? No  SHORT TERM GOALS: Target date: 11/27/2023   Will be compliant with appropriate progressive HEP Goal status: 11/06/23 MET for initial HEP   2. Knee AROM flexion to be at least 115* B and extension AROM to be no more than 5* B  Goal status: Ongoing 11/14/23  and 11/21/23   3. Will be independent with BLE edema management strategies  Goal status: progressing 11/21/23   4. Gait pattern to have normalized to include consistent step through pattern with good heel-toe mechanics, no toe out with gait Goal status: Progressing 11/14/23    LONG TERM GOALS: Target date: 12/25/2023    MMT to have improved by one grade all weak groups Goal status: Progressing 11/14/23   2. Will score at least 45 on Berg to show reduced fall risk  Goal status: initial    3. Will be able to ascend and descend stairs reciprocally with no increase in pain Goal status: initial   4. Will be able to ambulate community distances and perform all household tasks with no increase in pain  Goal status: initial   5. LEFS to have improved by at least 10 points to show improved QOL and subjective improvement     PLAN:  PT FREQUENCY: 2x/week  PT DURATION: 8 weeks  PLANNED INTERVENTIONS: 97750- Physical Performance Testing, 97110-Therapeutic exercises, 97530- Therapeutic activity, [id number]- Neuromuscular re-education, 97535- Self Care, 02859- Manual therapy, [phone]- Gait training, [phone]- Aquatic Therapy, [phone]- Vasopneumatic device, and Patient/Family education  PLAN FOR NEXT SESSION: progress strength and func gait   Jon Maeson Lourenco PTA 11/25/23 8:45 AM

## 2023-11-27 ENCOUNTER — Ambulatory Visit: Admitting: Physical Therapy

## 2023-11-27 DIAGNOSIS — R262 Difficulty in walking, not elsewhere classified: Secondary | ICD-10-CM

## 2023-11-27 DIAGNOSIS — M6281 Muscle weakness (generalized): Secondary | ICD-10-CM

## 2023-11-27 DIAGNOSIS — M25662 Stiffness of left knee, not elsewhere classified: Secondary | ICD-10-CM

## 2023-11-27 DIAGNOSIS — G8929 Other chronic pain: Secondary | ICD-10-CM

## 2023-11-27 DIAGNOSIS — M25561 Pain in right knee: Secondary | ICD-10-CM | POA: Diagnosis not present

## 2023-11-27 DIAGNOSIS — M25661 Stiffness of right knee, not elsewhere classified: Secondary | ICD-10-CM

## 2023-11-27 NOTE — Therapy (Signed)
 OUTPATIENT PHYSICAL THERAPY LOWER EXTREMITY   Patient Name: Tiffany Strickland MRN: [id number] DOB:11-09-1955, 68 y.o., female Today's Date: 11/27/2023  END OF SESSION:  PT End of Session - 11/27/23 0836     Visit Number 6    Number of Visits 17    Date for Recertification  12/25/23    Authorization Type [employer] Dual    Authorization Time Period 10/30/23 to 12/25/23    PT Start Time 0842    PT Stop Time 0925    PT Time Calculation (min) 43 min          Past Medical History:  Diagnosis Date   Colon polyp    adenomatous   Diabetes mellitus without complication (HCC)    Hyperlipidemia    Hypertension    Pulmonary embolism (HCC)    Sleep apnea    Stroke (HCC)    Thyroid  disease    Past Surgical History:  Procedure Laterality Date   ABDOMINAL HYSTERECTOMY     BLADDER SUSPENSION     COLONOSCOPY WITH PROPOFOL  N/A 05/05/2020   Procedure: COLONOSCOPY WITH PROPOFOL ;  Surgeon: Rollin Dover, MD;  Location: [facility];  Service: Endoscopy;  Laterality: N/A;   EUS N/A 08/12/2013   Procedure: LOWER ENDOSCOPIC ULTRASOUND (EUS);  Surgeon: Toribio SHAUNNA Cedar, MD;  Location: [facility];  Service: Endoscopy;  Laterality: N/A;   KNEE SURGERY     POLYPECTOMY  05/05/2020   Procedure: POLYPECTOMY;  Surgeon: Rollin Dover, MD;  Location: [facility];  Service: Endoscopy;;   TUBAL LIGATION     Patient Active Problem List   Diagnosis Date Noted   Acute pulmonary embolism (HCC) 08/16/2023   Acute kidney injury 08/16/2023   Posterior neck pain 05/29/2021   Chronic idiopathic constipation 06/21/2020   Diverticular disease of colon 06/21/2020   Change in bowel habit 03/15/2020   Epigastric pain 03/15/2020   Gastroesophageal reflux disease 03/15/2020   Other specified bacterial intestinal infections 03/15/2020   Pulmonary hypertension (HCC) 06/29/2018   Educated about COVID-19 virus infection 06/29/2018   OSA (obstructive sleep apnea) 02/03/2017   Hypothyroidism 02/02/2017   Chest pain  02/02/2017   Obesity, Class III, BMI 40-49.9 (morbid obesity) (HCC) 01/27/2017   Plantar fasciitis of left foot 11/08/2015   Non-insulin  treated type 2 diabetes mellitus (HCC) 06/08/2015   Colon cancer screening 06/08/2015   Bilateral leg edema 12/15/2014   Blurry vision, bilateral 06/16/2014   Breast cancer screening 06/16/2014   Other specified hypothyroidism 06/16/2014   Other seasonal allergic rhinitis 06/16/2014   History of stroke 01/31/2014   Rectal nodule 09/06/2013   Essential hypertension 08/24/2013   Nonspecific (abnormal) findings on radiological and other examination of gastrointestinal tract 08/12/2013   Lateral epicondylitis of right elbow 07/21/2013   Right shoulder pain 07/21/2013   Sprain of wrist, right 07/21/2013   Constipation 04/15/2013   Rectal bleeding 04/15/2013    PCP: Rik Glinda DASEN, FNP  REFERRING PROVIDER: Rik Glinda DASEN, FNP  REFERRING DIAG: [phone] (ICD-10-CM) - Pain in left knee  THERAPY DIAG:  Chronic pain of right knee  Chronic pain of left knee  Stiffness of right knee, not elsewhere classified  Difficulty in walking, not elsewhere classified  Muscle weakness (generalized)  Stiffness of left knee, not elsewhere classified  Rationale for Evaluation and Treatment: Rehabilitation  ONSET DATE:  Years off and on   SUBJECTIVE:   SUBJECTIVE STATEMENT:  amb in with better cadence. States wearing inserts for plantar fascitis and it helps. Does walk with BIL ER  I've had this issue for years off and on. Both knees hurt when I put pressure on it. I think I did too much one day and that's why I ended up seeing the doctor. Have had therapy for knees in the past but one time a baker's cyst developed and I ended up getting worse.    PERTINENT HISTORY:  See above  PAIN:  Are you having pain? Knees 3/10. Left ankle 7-8/10  PRECAUTIONS: Other: active blood thinners due to recent PE   RED FLAGS: None   WEIGHT BEARING RESTRICTIONS:  No  FALLS:  Has patient fallen in last 6 months? Yes. 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  DIAGNOSTIC FINDINGS:   Narrative & Impression CLINICAL DATA:  Chest pain   EXAM: PORTABLE CHEST 1 VIEW   COMPARISON:  Chest x-ray 08/16/2023   FINDINGS: The heart is mildly enlarged. The lungs are clear. There is no pleural effusion or pneumothorax. No acute fractures are seen.   IMPRESSION: Mild cardiomegaly. No acute pulmonary process.  IMPRESSION: 1. No pulmonary embolism. No acute intrathoracic pathology identified. 2. Morphologic changes in keeping with pulmonary arterial hypertension. 3. Mild cardiomegaly. 4. Mild hepatic steatosis.   PATIENT SURVEYS:   LEFS 46/80   COGNITION: Overall cognitive status: Within functional limits for tasks assessed     FLEXIBILITY  Tight hip ER groups, consistent toe out with gait and static positions Tight HS and hip flexors per observation     LOWER EXTREMITY ROM:  Active ROM Right eval Left eval Right AROM 11/14/23 Left AROM 11/14/23 RT/left 11/27/23  Hip flexion       Hip extension       Hip abduction       Hip adduction       Hip internal rotation       Hip external rotation       Knee flexion 103* 109* 102 100 110/115  Knee extension 9* 12* 3 0 0/0  Ankle dorsiflexion       Ankle plantarflexion       Ankle inversion       Ankle eversion        (Blank rows = not  tested)  LOWER EXTREMITY MMT:  MMT Right eval Left eval Right 11/14/23 Left 11/14/23 RT/Left  Hip flexion 4+ 4+ 5 5   Hip extension       Hip abduction 3 3+ 5 5   Hip adduction       Hip internal rotation       Hip external rotation       Knee flexion 4 4 4+ 4 4+/4+  Knee extension 4 4 4+ 4+ 4+/4+  Ankle dorsiflexion       Ankle plantarflexion       Ankle inversion       Ankle eversion        (Blank rows = not tested)  TREATMENT DATE:   11/27/23 Nustep L 5 LE only STS with wt ball press 10x 2 sets Farmers carry 8# 2 laps each hand with seated rest needed btwn 6 inch step up with opp leg ext 10 x- with UE support 6 inch lateral step up with opp leg abd 10x with UE support- hip weakness noted with lateral step up vs fwd L> RT Foam beam in // bars side stepping 5 x each way with light HHA, then tandem fwd and backward 5 x Knee ext 15 # 2 sets 10 HS curl 30# 2 sets 10   11/25/23 Resisted gait 40# 4 ways 5 x each Black bar heel raises 2 sets 10 Knee ext 10 # 2 sets 10 HS curl 25# 2 sets 10 6 inch step up 10 x BIL the side step up 10 x BIL- with UE support Seated blue tband hip flex and abd 2 sets 10 STS 10 x with wt ball 10 x 2 sets Nustep L 5  LE only    11/21/23 AROM Left knee  0-105 AROM RT  knee 0-102 Goals assessed LAQ 3# 2 sets 10 Standing with UE support 3# 12 x HS curl, hip ext,hip abd and hip flexion 3# marching fwd and walking backward 10 feet 5 x each 3# side stepping 5 x 10 feet Leg Press 40# 2 sets 10. Calf raises 2 sets 10 STS with wt ball 2 sets 10 Resisted gait 30# 4 ways 5 x each      11/14/23 NuStep L5 x 6 min PROM to bilateral knees  Patellar mobs (difficulty relaxing) Sit to stands 2x10 6 in step ups 2x5 30lb resisted side steps x5 each   11/06/23 Nustep L 6 5 min Resisted gait fwd and back 4  x each then laterally 3 x 20# Black bar heel raises  15 x ( attempted toe raise but unable) Red tband DF 2 sets 10 BIL Red tband HS curls 2 sets 10 3# LAQ 2 sets 10 Standing 3# SL hip flex,ext and abd 10 x each STS 10 x from mat     10/30/23  Eval, POC, HEP and education as below    Nustep L4x6 minutes all four extremities, seat 10  HEP practice and discussion    PATIENT EDUCATION:  Education details: exam findings, POC, HEP  Person educated: Patient Education method: Programmer, multimedia, Demonstration, and Handouts Education comprehension: verbalized understanding, returned demonstration, and needs further education  HOME EXERCISE PROGRAM:  Access Code: [email or web address] URL: [email or web address] Date: 10/30/2023 Prepared by: Josette Rough  Exercises - Supine Figure 4 Piriformis Stretch  - 1 x daily - 7 x weekly - 2 sets - 4-6 reps - 30 seconds  hold - Supine Bridge with Resistance Band  - 1 x daily - 7 x weekly - 2 sets - 10 reps - 2 seconds  hold - Clamshell with Resistance  - 1 x daily - 7 x weekly - 2 sets - 10 reps - 1 second  hold - Seated Knee Extension with Anchored Resistance  - 1 x daily - 7 x weekly - 2 sets - 10 reps - 2 seconds  hold  ASSESSMENT:  CLINICAL IMPRESSION:  Goals assessed and documented. MMT good. ROM much improved with flexion but not to goal yet. Progressed more func ex and step ups were challenging esp laterally. Progressed wt on knee ext and flexion machine.   Patient is a 68 y.o. F who was seen today for physical therapy evaluation  and treatment for M25.562 (ICD-10-CM) - Pain in left knee. Objectives as above. Has recent history of PE due to sedentary period, also has history of Baker's cyst and limited success in PT in the past due to this. Will make every effort to improve pain and function moving forward.  OBJECTIVE IMPAIRMENTS: Abnormal gait, decreased activity tolerance, decreased mobility, difficulty walking, decreased ROM, decreased  strength, impaired flexibility, obesity, and pain.   ACTIVITY LIMITATIONS: standing, squatting, stairs, transfers, and locomotion level  PARTICIPATION LIMITATIONS: meal prep, cleaning, laundry, driving, shopping, community activity, and yard work  PERSONAL FACTORS: Age, Behavior pattern, Education, Fitness, Past/current experiences, Social background, and Time since onset of injury/illness/exacerbation are also affecting patient's functional outcome.   REHAB POTENTIAL: Fair body habitus, chronicity of pain, limited success with PT in the past   CLINICAL DECISION MAKING: Stable/uncomplicated  EVALUATION COMPLEXITY: Low   GOALS: Goals reviewed with patient? No  SHORT TERM GOALS: Target date: 11/27/2023   Will be compliant with appropriate progressive HEP Goal status: 11/06/23 MET for initial HEP   2. Knee AROM flexion to be at least 115* B and extension AROM to be no more than 5* B  Goal status: Ongoing 11/14/23  and 11/21/23  11/27/23 met for ext and progressing for flexion   3. Will be independent with BLE edema management strategies  Goal status: progressing 11/21/23  10/16 /25 MET as she can verb tech but edema still an issue   4. Gait pattern to have normalized to include consistent step through pattern with good heel-toe mechanics, no toe out with gait Goal status: Progressing 11/14/23     11/27/23 progressing   LONG TERM GOALS: Target date: 12/25/2023    MMT to have improved by one grade all weak groups Goal status: Progressing 11/14/23  and 11/27/23   2. Will score at least 45 on Berg to show reduced fall risk  Goal status: initial    3. Will be able to ascend and descend stairs reciprocally with no increase in pain Goal status: initial   4. Will be able to ambulate community distances and perform all household tasks with no increase in pain  Goal status: initial   5. LEFS to have improved by at least 10 points to show improved QOL and subjective improvement      PLAN:  PT FREQUENCY: 2x/week  PT DURATION: 8 weeks  PLANNED INTERVENTIONS: 97750- Physical Performance Testing, 97110-Therapeutic exercises, 97530- Therapeutic activity, [id number]- Neuromuscular re-education, 97535- Self Care, [id number]- Manual therapy, [phone]- Gait training, [phone]- Aquatic Therapy, [phone]- Vasopneumatic device, and Patient/Family education  PLAN FOR NEXT SESSION: progress strength and func gait   Jon Aeralyn Barna PTA 11/27/23 8:39 AM

## 2023-12-01 ENCOUNTER — Ambulatory Visit: Admitting: Podiatry

## 2023-12-08 ENCOUNTER — Ambulatory Visit (INDEPENDENT_AMBULATORY_CARE_PROVIDER_SITE_OTHER): Admitting: Podiatry

## 2023-12-08 ENCOUNTER — Ambulatory Visit

## 2023-12-08 DIAGNOSIS — Z91198 Patient's noncompliance with other medical treatment and regimen for other reason: Secondary | ICD-10-CM

## 2023-12-08 NOTE — Progress Notes (Signed)
 1. Failure to attend appointment with reason given

## 2023-12-09 ENCOUNTER — Ambulatory Visit

## 2023-12-11 ENCOUNTER — Ambulatory Visit: Admitting: Physical Therapy

## 2023-12-11 DIAGNOSIS — M25662 Stiffness of left knee, not elsewhere classified: Secondary | ICD-10-CM

## 2023-12-11 DIAGNOSIS — G8929 Other chronic pain: Secondary | ICD-10-CM

## 2023-12-11 DIAGNOSIS — M6281 Muscle weakness (generalized): Secondary | ICD-10-CM

## 2023-12-11 DIAGNOSIS — M25661 Stiffness of right knee, not elsewhere classified: Secondary | ICD-10-CM

## 2023-12-11 DIAGNOSIS — M25561 Pain in right knee: Secondary | ICD-10-CM | POA: Diagnosis not present

## 2023-12-11 DIAGNOSIS — R262 Difficulty in walking, not elsewhere classified: Secondary | ICD-10-CM

## 2023-12-11 NOTE — Therapy (Signed)
 OUTPATIENT PHYSICAL THERAPY LOWER EXTREMITY   Patient Name: Tiffany Strickland MRN: [id number] DOB:Jun 30, 1955, 68 y.o., female Today's Date: 12/11/2023  END OF SESSION:  PT End of Session - 12/11/23 0809     Visit Number 7    Date for Recertification  12/25/23    Authorization Type [employer] Dual    Authorization Time Period 10/30/23 to 12/25/23    PT Start Time 0808    PT Stop Time 0846    PT Time Calculation (min) 38 min          Past Medical History:  Diagnosis Date   Colon polyp    adenomatous   Diabetes mellitus without complication (HCC)    Hyperlipidemia    Hypertension    Pulmonary embolism (HCC)    Sleep apnea    Stroke (HCC)    Thyroid  disease    Past Surgical History:  Procedure Laterality Date   ABDOMINAL HYSTERECTOMY     BLADDER SUSPENSION     COLONOSCOPY WITH PROPOFOL  N/A 05/05/2020   Procedure: COLONOSCOPY WITH PROPOFOL ;  Surgeon: Rollin Dover, MD;  Location: [facility];  Service: Endoscopy;  Laterality: N/A;   EUS N/A 08/12/2013   Procedure: LOWER ENDOSCOPIC ULTRASOUND (EUS);  Surgeon: Toribio SHAUNNA Cedar, MD;  Location: [facility];  Service: Endoscopy;  Laterality: N/A;   KNEE SURGERY     POLYPECTOMY  05/05/2020   Procedure: POLYPECTOMY;  Surgeon: Rollin Dover, MD;  Location: [facility];  Service: Endoscopy;;   TUBAL LIGATION     Patient Active Problem List   Diagnosis Date Noted   Acute pulmonary embolism (HCC) 08/16/2023   Acute kidney injury 08/16/2023   Posterior neck pain 05/29/2021   Chronic idiopathic constipation 06/21/2020   Diverticular disease of colon 06/21/2020   Change in bowel habit 03/15/2020   Epigastric pain 03/15/2020   Gastroesophageal reflux disease 03/15/2020   Other specified bacterial intestinal infections 03/15/2020   Pulmonary hypertension (HCC) 06/29/2018   Educated about COVID-19 virus infection 06/29/2018   OSA (obstructive sleep apnea) 02/03/2017   Hypothyroidism 02/02/2017   Chest pain 02/02/2017   Obesity,  Class III, BMI 40-49.9 (morbid obesity) (HCC) 01/27/2017   Plantar fasciitis of left foot 11/08/2015   Non-insulin  treated type 2 diabetes mellitus (HCC) 06/08/2015   Colon cancer screening 06/08/2015   Bilateral leg edema 12/15/2014   Blurry vision, bilateral 06/16/2014   Breast cancer screening 06/16/2014   Other specified hypothyroidism 06/16/2014   Other seasonal allergic rhinitis 06/16/2014   History of stroke 01/31/2014   Rectal nodule 09/06/2013   Essential hypertension 08/24/2013   Nonspecific (abnormal) findings on radiological and other examination of gastrointestinal tract 08/12/2013   Lateral epicondylitis of right elbow 07/21/2013   Right shoulder pain 07/21/2013   Sprain of wrist, right 07/21/2013   Constipation 04/15/2013   Rectal bleeding 04/15/2013    PCP: Rik Glinda DASEN, FNP  REFERRING PROVIDER: Rik Glinda DASEN, FNP  REFERRING DIAG: [phone] (ICD-10-CM) - Pain in left knee  THERAPY DIAG:  Chronic pain of right knee  Chronic pain of left knee  Stiffness of right knee, not elsewhere classified  Difficulty in walking, not elsewhere classified  Muscle weakness (generalized)  Stiffness of left knee, not elsewhere classified  Rationale for Evaluation and Treatment: Rehabilitation  ONSET DATE:  Years off and on   SUBJECTIVE:   SUBJECTIVE STATEMENT:  OOT last week, fell ( last weds) packing up luggage. Knees,ankles and back painful    I've had this issue for years off and on. Both  knees hurt when I put pressure on it. I think I did too much one day and that's why I ended up seeing the doctor. Have had therapy for knees in the past but one time a baker's cyst developed and I ended up getting worse.    PERTINENT HISTORY:  See above  PAIN:  Are you having pain? Knees 6-7/10. Left ankle 5-6/10, back 8-9/10  PRECAUTIONS: Other: active blood thinners due to recent PE   RED FLAGS: None   WEIGHT BEARING RESTRICTIONS: No  FALLS:  Has patient fallen  in last 6 months? Yes. 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  DIAGNOSTIC FINDINGS:   Narrative & Impression CLINICAL DATA:  Chest pain   EXAM: PORTABLE CHEST 1 VIEW   COMPARISON:  Chest x-ray 08/16/2023   FINDINGS: The heart is mildly enlarged. The lungs are clear. There is no pleural effusion or pneumothorax. No acute fractures are seen.   IMPRESSION: Mild cardiomegaly. No acute pulmonary process.  IMPRESSION: 1. No pulmonary embolism. No acute intrathoracic pathology identified. 2. Morphologic changes in keeping with pulmonary arterial hypertension. 3. Mild cardiomegaly. 4. Mild hepatic steatosis.   PATIENT SURVEYS:   LEFS 46/80   COGNITION: Overall cognitive status: Within functional limits for tasks assessed     FLEXIBILITY  Tight hip ER groups, consistent toe out with gait and static positions Tight HS and hip flexors per observation     LOWER EXTREMITY ROM:  Active ROM Right eval Left eval Right AROM 11/14/23 Left AROM 11/14/23 RT/left 11/27/23 RT/Left 12/11/23  Hip flexion        Hip extension        Hip abduction        Hip adduction        Hip internal rotation        Hip external rotation        Knee flexion 103* 109* 102 100 110/115 108/98  Knee extension 9* 12* 3 0 0/0   Ankle dorsiflexion        Ankle plantarflexion        Ankle inversion        Ankle eversion         (Blank rows = not  tested)  LOWER EXTREMITY MMT:  MMT Right eval Left eval Right 11/14/23 Left 11/14/23 RT/Left RT /left 12/11/23  Hip flexion 4+ 4+ 5 5    Hip extension        Hip abduction 3 3+ 5 5    Hip adduction        Hip internal rotation        Hip external rotation        Knee flexion 4 4 4+ 4 4+/4+ 4+ BIL with pain  Knee extension 4 4 4+ 4+ 4+/4+ 4+ BIL with pain  Ankle dorsiflexion        Ankle plantarflexion        Ankle inversion  Ankle eversion         (Blank rows = not tested)                                                                                                                                  TREATMENT DATE:    12/11/23 Nustep L 5 6 min MMT/ROM and goals assessed and documented Seated dyna disc 4 way 10 x each for lumbar ROM 3# on dyna disc 2 sets 10 LAQ,hip flex and hip abd Green tband HS curl 2 sets 10 STS with wt ball press 10 x 30 # resisted gait 4 ways 4 x each   11/27/23 Nustep L 5 LE only STS with wt ball press 10x 2 sets Farmers carry 8# 2 laps each hand with seated rest needed btwn 6 inch step up with opp leg ext 10 x- with UE support 6 inch lateral step up with opp leg abd 10x with UE support- hip weakness noted with lateral step up vs fwd L> RT Foam beam in // bars side stepping 5 x each way with light HHA, then tandem fwd and backward 5 x Knee ext 15 # 2 sets 10 HS curl 30# 2 sets 10   11/25/23 Resisted gait 40# 4 ways 5 x each Black bar heel raises 2 sets 10 Knee ext 10 # 2 sets 10 HS curl 25# 2 sets 10 6 inch step up 10 x BIL the side step up 10 x BIL- with UE support Seated blue tband hip flex and abd 2 sets 10 STS 10 x with wt ball 10 x 2 sets Nustep L 5  LE only    11/21/23 AROM Left knee  0-105 AROM RT  knee 0-102 Goals assessed LAQ 3# 2 sets 10 Standing with UE support 3# 12 x HS curl, hip ext,hip abd and hip flexion 3# marching fwd and walking backward 10 feet 5 x each 3# side stepping 5 x 10 feet Leg  Press 40# 2 sets 10. Calf raises 2 sets 10 STS with wt ball 2 sets 10 Resisted gait 30# 4 ways 5 x each      11/14/23 NuStep L5 x 6 min PROM to bilateral knees  Patellar mobs (difficulty relaxing) Sit to stands 2x10 6 in step ups 2x5 30lb resisted side steps x5 each   11/06/23 Nustep L 6 5 min Resisted gait fwd and back 4 x each then laterally 3 x 20# Black bar heel raises  15 x ( attempted toe raise but unable) Red tband DF 2 sets 10 BIL Red tband HS curls 2 sets 10 3# LAQ 2 sets 10 Standing 3# SL hip flex,ext and abd 10 x each STS 10 x from mat     10/30/23  Eval, POC, HEP and education as below    Nustep L4x6 minutes all four extremities, seat 10  HEP practice and discussion    PATIENT EDUCATION:  Education details: exam findings, POC, HEP  Person educated: Patient Education method: Explanation, Demonstration, and Handouts Education comprehension: verbalized understanding, returned demonstration, and needs further education  HOME EXERCISE PROGRAM:  Access Code: [email or web address] URL: [email or web address] Date: 10/30/2023 Prepared by: Josette Rough  Exercises - Supine Figure 4 Piriformis Stretch  - 1 x daily - 7 x weekly - 2 sets - 4-6 reps - 30 seconds  hold - Supine Bridge with Resistance Band  - 1 x daily - 7 x weekly - 2 sets - 10 reps - 2 seconds  hold - Clamshell with Resistance  - 1 x daily - 7 x weekly - 2 sets - 10 reps - 1 second  hold - Seated Knee Extension with Anchored Resistance  - 1 x daily - 7 x weekly - 2 sets - 10 reps - 2 seconds  hold  ASSESSMENT:  CLINICAL IMPRESSION:  Pnt amb in with antalgic gait and holding RT LB after losing her balance and falling while OOT last week.Goals assessed and documented. MMT good but more pain with resistance today. ROM decreased after fall. Pnt was OOT last week and had a fall. Pt had increased pain and stiffness but tolerated session well.   Patient is a 68 y.o. F who was seen today for  physical therapy evaluation and treatment for M25.562 (ICD-10-CM) - Pain in left knee. Objectives as above. Has recent history of PE due to sedentary period, also has history of Baker's cyst and limited success in PT in the past due to this. Will make every effort to improve pain and function moving forward.  OBJECTIVE IMPAIRMENTS: Abnormal gait, decreased activity tolerance, decreased mobility, difficulty walking, decreased ROM, decreased strength, impaired flexibility, obesity, and pain.   ACTIVITY LIMITATIONS: standing, squatting, stairs, transfers, and locomotion level  PARTICIPATION LIMITATIONS: meal prep, cleaning, laundry, driving, shopping, community activity, and yard work  PERSONAL FACTORS: Age, Behavior pattern, Education, Fitness, Past/current experiences, Social background, and Time since onset of injury/illness/exacerbation are also affecting patient's functional outcome.   REHAB POTENTIAL: Fair body habitus, chronicity of pain, limited success with PT in the past   CLINICAL DECISION MAKING: Stable/uncomplicated  EVALUATION COMPLEXITY: Low   GOALS: Goals reviewed with patient? No  SHORT TERM GOALS: Target date: 11/27/2023   Will be compliant with appropriate progressive HEP Goal status: 11/06/23 MET for initial HEP   2. Knee AROM flexion to be at least 115* B and extension AROM to be no more than 5* B  Goal status: Ongoing 11/14/23  and 11/21/23  11/27/23 met for ext and progressing for flexion   3. Will be independent with BLE edema management strategies  Goal status: progressing 11/21/23  10/16 /25 MET as she can verb tech but edema still an issue   4. Gait pattern to have normalized to include consistent step through pattern with good heel-toe mechanics, no toe out with gait Goal status: Progressing 11/14/23     11/27/23 progressing 12/11/23 progressing  LONG TERM GOALS: Target date: 12/25/2023    MMT to have improved by one grade all weak groups Goal status:  Progressing 11/14/23  and 11/27/23 12/11/23 slight set back after fall , increased pain   2. Will score at least 45 on Berg to show reduced fall risk  Goal status: initial    3. Will be able to ascend and descend stairs reciprocally with no increase in pain Goal status: initial   4. Will be able to ambulate community distances and perform all household tasks with no  increase in pain  Goal status: 12/11/23 progressing   5. LEFS to have improved by at least 10 points to show improved QOL and subjective improvement     PLAN:  PT FREQUENCY: 2x/week  PT DURATION: 8 weeks  PLANNED INTERVENTIONS: 97750- Physical Performance Testing, 97110-Therapeutic exercises, 97530- Therapeutic activity, [id number]- Neuromuscular re-education, 97535- Self Care, [id number]- Manual therapy, [id number]- Gait training, [phone]- Aquatic Therapy, [phone]- Vasopneumatic device, and Patient/Family education  PLAN FOR NEXT SESSION: progress strength and func gait . Check BERG  Jon Leonard Hendler PTA 12/11/23 8:10 AM

## 2023-12-15 ENCOUNTER — Encounter: Payer: Self-pay | Admitting: Radiology

## 2023-12-16 ENCOUNTER — Ambulatory Visit: Admitting: Physical Therapy

## 2023-12-18 ENCOUNTER — Ambulatory Visit: Admitting: Podiatry

## 2023-12-18 ENCOUNTER — Ambulatory Visit: Attending: Family | Admitting: Physical Therapy

## 2023-12-18 DIAGNOSIS — M25661 Stiffness of right knee, not elsewhere classified: Secondary | ICD-10-CM | POA: Diagnosis present

## 2023-12-18 DIAGNOSIS — M25562 Pain in left knee: Secondary | ICD-10-CM | POA: Insufficient documentation

## 2023-12-18 DIAGNOSIS — G8929 Other chronic pain: Secondary | ICD-10-CM | POA: Diagnosis present

## 2023-12-18 DIAGNOSIS — M25561 Pain in right knee: Secondary | ICD-10-CM | POA: Insufficient documentation

## 2023-12-18 DIAGNOSIS — M25662 Stiffness of left knee, not elsewhere classified: Secondary | ICD-10-CM | POA: Diagnosis present

## 2023-12-18 DIAGNOSIS — R262 Difficulty in walking, not elsewhere classified: Secondary | ICD-10-CM | POA: Insufficient documentation

## 2023-12-18 DIAGNOSIS — M6281 Muscle weakness (generalized): Secondary | ICD-10-CM | POA: Insufficient documentation

## 2023-12-18 DIAGNOSIS — Z91199 Patient's noncompliance with other medical treatment and regimen due to unspecified reason: Secondary | ICD-10-CM

## 2023-12-18 NOTE — Therapy (Signed)
 OUTPATIENT PHYSICAL THERAPY LOWER EXTREMITY   Patient Name: Tiffany Strickland MRN: [id number] DOB:June 07, 1955, 68 y.o., female Today's Date: 12/18/2023  END OF SESSION:  PT End of Session - 12/18/23 0754     Visit Number 8    Number of Visits 17    Date for Recertification  12/25/23    Authorization Type [employer] Dual    PT Start Time 0754    PT Stop Time 0840    PT Time Calculation (min) 46 min          Past Medical History:  Diagnosis Date   Colon polyp    adenomatous   Diabetes mellitus without complication (HCC)    Hyperlipidemia    Hypertension    Pulmonary embolism (HCC)    Sleep apnea    Stroke (HCC)    Thyroid  disease    Past Surgical History:  Procedure Laterality Date   ABDOMINAL HYSTERECTOMY     BLADDER SUSPENSION     COLONOSCOPY WITH PROPOFOL  N/A 05/05/2020   Procedure: COLONOSCOPY WITH PROPOFOL ;  Surgeon: Rollin Dover, MD;  Location: [facility];  Service: Endoscopy;  Laterality: N/A;   EUS N/A 08/12/2013   Procedure: LOWER ENDOSCOPIC ULTRASOUND (EUS);  Surgeon: Toribio SHAUNNA Cedar, MD;  Location: [facility];  Service: Endoscopy;  Laterality: N/A;   KNEE SURGERY     POLYPECTOMY  05/05/2020   Procedure: POLYPECTOMY;  Surgeon: Rollin Dover, MD;  Location: [facility];  Service: Endoscopy;;   TUBAL LIGATION     Patient Active Problem List   Diagnosis Date Noted   Acute pulmonary embolism (HCC) 08/16/2023   Acute kidney injury 08/16/2023   Posterior neck pain 05/29/2021   Chronic idiopathic constipation 06/21/2020   Diverticular disease of colon 06/21/2020   Change in bowel habit 03/15/2020   Epigastric pain 03/15/2020   Gastroesophageal reflux disease 03/15/2020   Other specified bacterial intestinal infections 03/15/2020   Pulmonary hypertension (HCC) 06/29/2018   Educated about COVID-19 virus infection 06/29/2018   OSA (obstructive sleep apnea) 02/03/2017   Hypothyroidism 02/02/2017   Chest pain 02/02/2017   Obesity, Class III, BMI 40-49.9  (morbid obesity) (HCC) 01/27/2017   Plantar fasciitis of left foot 11/08/2015   Non-insulin  treated type 2 diabetes mellitus (HCC) 06/08/2015   Colon cancer screening 06/08/2015   Bilateral leg edema 12/15/2014   Blurry vision, bilateral 06/16/2014   Breast cancer screening 06/16/2014   Other specified hypothyroidism 06/16/2014   Other seasonal allergic rhinitis 06/16/2014   History of stroke 01/31/2014   Rectal nodule 09/06/2013   Essential hypertension 08/24/2013   Nonspecific (abnormal) findings on radiological and other examination of gastrointestinal tract 08/12/2013   Lateral epicondylitis of right elbow 07/21/2013   Right shoulder pain 07/21/2013   Sprain of wrist, right 07/21/2013   Constipation 04/15/2013   Rectal bleeding 04/15/2013    PCP: Rik Glinda DASEN, FNP  REFERRING PROVIDER: Rik Glinda DASEN, FNP  REFERRING DIAG: [phone] (ICD-10-CM) - Pain in left knee  THERAPY DIAG:  Chronic pain of right knee  Chronic pain of left knee  Stiffness of right knee, not elsewhere classified  Difficulty in walking, not elsewhere classified  Muscle weakness (generalized)  Stiffness of left knee, not elsewhere classified  Rationale for Evaluation and Treatment: Rehabilitation  ONSET DATE:  Years off and on   SUBJECTIVE:   SUBJECTIVE STATEMENT:  amb in very slowly and antalgic very knees are okay its the back hurting form the fall    I've had this issue for years off and on.  Both knees hurt when I put pressure on it. I think I did too much one day and that's why I ended up seeing the doctor. Have had therapy for knees in the past but one time a baker's cyst developed and I ended up getting worse.    PERTINENT HISTORY:  See above  PAIN:  Are you having pain? Knees 4-5/10. Left ankle 3-4/10, back 8/1- PRECAUTIONS: Other: active blood thinners due to recent PE   RED FLAGS: None   WEIGHT BEARING RESTRICTIONS: No  FALLS:  Has patient fallen in last 6 months? Yes.  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  DIAGNOSTIC FINDINGS:   Narrative & Impression CLINICAL DATA:  Chest pain   EXAM: PORTABLE CHEST 1 VIEW   COMPARISON:  Chest x-ray 08/16/2023   FINDINGS: The heart is mildly enlarged. The lungs are clear. There is no pleural effusion or pneumothorax. No acute fractures are seen.   IMPRESSION: Mild cardiomegaly. No acute pulmonary process.  IMPRESSION: 1. No pulmonary embolism. No acute intrathoracic pathology identified. 2. Morphologic changes in keeping with pulmonary arterial hypertension. 3. Mild cardiomegaly. 4. Mild hepatic steatosis.   PATIENT SURVEYS:   LEFS 46/80   COGNITION: Overall cognitive status: Within functional limits for tasks assessed     FLEXIBILITY  Tight hip ER groups, consistent toe out with gait and static positions Tight HS and hip flexors per observation     LOWER EXTREMITY ROM:  Active ROM Right eval Left eval Right AROM 11/14/23 Left AROM 11/14/23 RT/left 11/27/23 RT/Left 12/11/23  Hip flexion        Hip extension        Hip abduction        Hip adduction        Hip internal rotation        Hip external rotation        Knee flexion 103* 109* 102 100 110/115 108/98  Knee extension 9* 12* 3 0 0/0   Ankle dorsiflexion        Ankle plantarflexion        Ankle inversion        Ankle eversion         (Blank rows = not tested)  LOWER EXTREMITY  MMT:  MMT Right eval Left eval Right 11/14/23 Left 11/14/23 RT/Left RT /left 12/11/23  Hip flexion 4+ 4+ 5 5    Hip extension        Hip abduction 3 3+ 5 5    Hip adduction        Hip internal rotation        Hip external rotation        Knee flexion 4 4 4+ 4 4+/4+ 4+ BIL with pain  Knee extension 4 4 4+ 4+ 4+/4+ 4+ BIL with pain  Ankle dorsiflexion        Ankle plantarflexion        Ankle inversion  Ankle eversion         (Blank rows = not tested)                                                                                                                                  TREATMENT DATE:   12/18/23 Nustep L 5 - verb 8/10 pain pain she arrived with was gone after this MMT grossly tested BIL LE hip and knee WFL without pain AROM BIL knee- see below BERG 50/56 HS curl 35# 2 sets 10 Knee ext 15# 2 sets 10 Back ext black band 2 sets 10 Working on stairs , step over step  4 in and  6 in 2 rail , then 1 rail 6 inch step up BIL UE support step up with opp leg ext then laterally with opp leg abd STS with wt ball chest press 10 x   12/11/23 Nustep L 5 6 min MMT/ROM and goals assessed and documented Seated dyna disc 4 way 10 x each for lumbar ROM 3# on dyna disc 2 sets 10 LAQ,hip flex and hip abd Green tband HS curl 2 sets 10 STS with wt ball press 10 x 30 # resisted gait 4 ways 4 x each   11/27/23 Nustep L 5 LE only STS with wt ball press 10x 2 sets Farmers carry 8# 2 laps each hand with seated rest needed btwn 6 inch step up with opp leg ext 10 x- with UE support 6 inch lateral step up with opp leg abd 10x with UE support- hip weakness noted with lateral step up vs fwd L> RT Foam beam in // bars side stepping 5 x each way with light HHA, then tandem fwd and backward 5 x Knee ext 15 # 2 sets 10 HS curl 30# 2 sets 10   11/25/23 Resisted gait 40# 4 ways 5 x each Black bar heel raises 2 sets 10 Knee ext 10 # 2 sets 10 HS curl 25# 2 sets 10 6 inch  step up 10 x BIL the side step up 10 x BIL- with UE support Seated blue tband hip flex and abd 2 sets 10 STS 10 x with wt ball 10 x 2 sets Nustep L 5  LE only    11/21/23 AROM Left knee  0-105 AROM RT  knee 0-102 Goals assessed LAQ 3# 2 sets 10 Standing with UE support 3# 12 x HS curl, hip ext,hip abd and hip flexion 3# marching fwd and walking backward 10 feet 5 x each 3# side stepping 5 x 10 feet Leg Press 40# 2 sets 10. Calf raises 2 sets 10 STS with wt ball 2 sets 10 Resisted gait 30# 4 ways 5 x each      11/14/23 NuStep L5 x 6 min PROM to bilateral knees  Patellar mobs (difficulty relaxing) Sit to stands 2x10 6 in step ups 2x5 30lb resisted side steps x5 each  11/06/23 Nustep L 6 5 min Resisted gait fwd and back 4 x each then laterally 3 x 20# Black bar heel raises  15 x ( attempted toe raise but unable) Red tband DF 2 sets 10 BIL Red tband HS curls 2 sets 10 3# LAQ 2 sets 10 Standing 3# SL hip flex,ext and abd 10 x each STS 10 x from mat     10/30/23  Eval, POC, HEP and education as below    Nustep L4x6 minutes all four extremities, seat 10  HEP practice and discussion    PATIENT EDUCATION:  Education details: exam findings, POC, HEP  Person educated: Patient Education method: Programmer, Multimedia, Demonstration, and Handouts Education comprehension: verbalized understanding, returned demonstration, and needs further education  HOME EXERCISE PROGRAM:  Access Code: [email or web address] URL: [email or web address] Date: 10/30/2023 Prepared by: Josette Rough  Exercises - Supine Figure 4 Piriformis Stretch  - 1 x daily - 7 x weekly - 2 sets - 4-6 reps - 30 seconds  hold - Supine Bridge with Resistance Band  - 1 x daily - 7 x weekly - 2 sets - 10 reps - 2 seconds  hold - Clamshell with Resistance  - 1 x daily - 7 x weekly - 2 sets - 10 reps - 1 second  hold - Seated Knee Extension with Anchored Resistance  - 1 x daily - 7 x weekly - 2 sets - 10  reps - 2 seconds  hold  ASSESSMENT:  CLINICAL IMPRESSION:  Pnt amb in with antalgic gait and holding and moving slow, verb increased LBP since fall and may need to get xray.  Pnt at [facility] weekly , several days,doing Nustep up to 20 min and machine interventions( reviewed good machines to use) and working to back into pool.Tolerated all exercise interventions well with cuing for speed and control of mvmt as well as core engagement. Goals assessed and documented.   Patient is a 68 y.o. F who was seen today for physical therapy evaluation and treatment for M25.562 (ICD-10-CM) - Pain in left knee. Objectives as above. Has recent history of PE due to sedentary period, also has history of Baker's cyst and limited success in PT in the past due to this. Will make every effort to improve pain and function moving forward.  OBJECTIVE IMPAIRMENTS: Abnormal gait, decreased activity tolerance, decreased mobility, difficulty walking, decreased ROM, decreased strength, impaired flexibility, obesity, and pain.   ACTIVITY LIMITATIONS: standing, squatting, stairs, transfers, and locomotion level  PARTICIPATION LIMITATIONS: meal prep, cleaning, laundry, driving, shopping, community activity, and yard work  PERSONAL FACTORS: Age, Behavior pattern, Education, Fitness, Past/current experiences, Social background, and Time since onset of injury/illness/exacerbation are also affecting patient's functional outcome.   REHAB POTENTIAL: Fair body habitus, chronicity of pain, limited success with PT in the past   CLINICAL DECISION MAKING: Stable/uncomplicated  EVALUATION COMPLEXITY: Low   GOALS: Goals reviewed with patient? No  SHORT TERM GOALS: Target date: 11/27/2023   Will be compliant with appropriate progressive HEP Goal status: 11/06/23 MET for initial HEP   2. Knee AROM flexion to be at least 115* B and extension AROM to be no more than 5* B  Goal status: Ongoing 11/14/23  and 11/21/23  11/27/23 met for ext  and progressing for flexion . 12/18/23  Rt knee AROM   0-108               Left knee AROM 2-110   3. Will be independent with BLE edema management strategies  Goal status:  progressing 11/21/23  10/16 /25 MET as she can verb tech but edema still an issue   4. Gait pattern to have normalized to include consistent step through pattern with good heel-toe mechanics, no toe out with gait Goal status: Progressing 11/14/23     11/27/23 progressing 12/11/23 progressing  12/18/23 progressing  LONG TERM GOALS: Target date: 12/25/2023    MMT to have improved by one grade all weak groups Goal status: Progressing 11/14/23  and 11/27/23 12/11/23 slight set back after fall , increased pain 12/18/23 MET   2. Will score at least 45 on Berg to show reduced fall risk  Goal status: 12/18/23 MET   3. Will be able to ascend and descend stairs reciprocally with no increase in pain Goal status: 12/18/23 progressing- still limiited   4. Will be able to ambulate community distances and perform all household tasks with no increase in pain  Goal status: 12/11/23 progressing   5. LEFS to have improved by at least 10 points to show improved QOL and subjective improvement     PLAN:  PT FREQUENCY: 2x/week  PT DURATION: 8 weeks  PLANNED INTERVENTIONS: 97750- Physical Performance Testing, 97110-Therapeutic exercises, 97530- Therapeutic activity, [id number]- Neuromuscular re-education, 97535- Self Care, [id number]- Manual therapy, [id number]- Gait training, [phone]- Aquatic Therapy, [phone]- Vasopneumatic device, and Patient/Family education  PLAN FOR NEXT SESSION: progress strength and func gait .  Jon Laverle Pillard PTA 12/18/23 7:54 AM

## 2023-12-22 ENCOUNTER — Emergency Department (HOSPITAL_COMMUNITY)
Admission: EM | Admit: 2023-12-22 | Discharge: 2023-12-23 | Disposition: A | Discharge status: Home / Self Care | Attending: Emergency Medicine | Admitting: Emergency Medicine

## 2023-12-22 ENCOUNTER — Emergency Department (HOSPITAL_COMMUNITY)

## 2023-12-22 ENCOUNTER — Other Ambulatory Visit: Payer: Self-pay

## 2023-12-22 ENCOUNTER — Encounter (HOSPITAL_COMMUNITY): Payer: Self-pay | Admitting: *Deleted

## 2023-12-22 DIAGNOSIS — E119 Type 2 diabetes mellitus without complications: Secondary | ICD-10-CM | POA: Insufficient documentation

## 2023-12-22 DIAGNOSIS — Z7901 Long term (current) use of anticoagulants: Secondary | ICD-10-CM | POA: Insufficient documentation

## 2023-12-22 DIAGNOSIS — W010XXA Fall on same level from slipping, tripping and stumbling without subsequent striking against object, initial encounter: Secondary | ICD-10-CM | POA: Insufficient documentation

## 2023-12-22 DIAGNOSIS — Z79899 Other long term (current) drug therapy: Secondary | ICD-10-CM | POA: Insufficient documentation

## 2023-12-22 DIAGNOSIS — R079 Chest pain, unspecified: Secondary | ICD-10-CM

## 2023-12-22 DIAGNOSIS — Z86711 Personal history of pulmonary embolism: Secondary | ICD-10-CM | POA: Insufficient documentation

## 2023-12-22 DIAGNOSIS — Z8673 Personal history of transient ischemic attack (TIA), and cerebral infarction without residual deficits: Secondary | ICD-10-CM | POA: Diagnosis not present

## 2023-12-22 DIAGNOSIS — Z7982 Long term (current) use of aspirin: Secondary | ICD-10-CM | POA: Insufficient documentation

## 2023-12-22 DIAGNOSIS — I10 Essential (primary) hypertension: Secondary | ICD-10-CM | POA: Diagnosis not present

## 2023-12-22 DIAGNOSIS — R0789 Other chest pain: Secondary | ICD-10-CM | POA: Diagnosis present

## 2023-12-22 DIAGNOSIS — Z7984 Long term (current) use of oral hypoglycemic drugs: Secondary | ICD-10-CM | POA: Diagnosis not present

## 2023-12-22 DIAGNOSIS — M545 Low back pain, unspecified: Secondary | ICD-10-CM | POA: Insufficient documentation

## 2023-12-22 DIAGNOSIS — S3993XA Unspecified injury of pelvis, initial encounter: Secondary | ICD-10-CM | POA: Diagnosis present

## 2023-12-22 DIAGNOSIS — W19XXXA Unspecified fall, initial encounter: Secondary | ICD-10-CM

## 2023-12-22 NOTE — ED Triage Notes (Addendum)
 Pt having centralized chest pain, starts from her back and comes into her chest. Hx of blood clots. Reports compliance with blood thinners, has not missed any doses.   Also reports a fall over a week ago while on a trip, pain to r buttock and back

## 2023-12-23 ENCOUNTER — Emergency Department (HOSPITAL_COMMUNITY)

## 2023-12-23 ENCOUNTER — Encounter (HOSPITAL_COMMUNITY): Payer: Self-pay

## 2023-12-23 DIAGNOSIS — R0789 Other chest pain: Secondary | ICD-10-CM | POA: Diagnosis not present

## 2023-12-23 LAB — CBC
HCT: 40.9 % (ref 36.0–46.0)
Hemoglobin: 12.8 g/dL (ref 12.0–15.0)
MCH: 29.2 pg (ref 26.0–34.0)
MCHC: 31.3 g/dL (ref 30.0–36.0)
MCV: 93.2 fL (ref 80.0–100.0)
Platelets: 374 K/uL (ref 150–400)
RBC: 4.39 MIL/uL (ref 3.87–5.11)
RDW: 13.8 % (ref 11.5–15.5)
WBC: 7.4 K/uL (ref 4.0–10.5)
nRBC: 0 % (ref 0.0–0.2)

## 2023-12-23 LAB — TROPONIN T, HIGH SENSITIVITY: Troponin T High Sensitivity: <15 ng/L (ref 0–19)

## 2023-12-23 LAB — BASIC METABOLIC PANEL WITH GFR
Anion gap: 10 (ref 5–15)
BUN: 20 mg/dL (ref 8–23)
CO2: 28 mmol/L (ref 22–32)
Calcium: 10.1 mg/dL (ref 8.9–10.3)
Chloride: 99 mmol/L (ref 98–111)
Creatinine, Ser: 0.88 mg/dL (ref 0.44–1.00)
GFR, Estimated: >60 mL/min (ref >60)
Glucose, Bld: 162 mg/dL — ABNORMAL HIGH (ref 70–99)
Potassium: 4.8 mmol/L (ref 3.5–5.1)
Sodium: 137 mmol/L (ref 135–145)

## 2023-12-23 MED ORDER — IOHEXOL 350 MG/ML SOLN
100.0000 mL | Freq: Once | INTRAVENOUS | Status: AC | PRN
  Administered 2023-12-23: 100 mL via INTRAVENOUS

## 2023-12-23 NOTE — Discharge Instructions (Signed)
 Your evaluation did not show any serious injury and shows no sign of any serious problems causing your chest discomfort.  However, it would be wise to follow-up with your cardiologist.  In the meantime, apply ice to the areas that are sore, you may take acetaminophen  as needed for pain.  Return to the emergency department if you have any worsening of your symptoms.

## 2023-12-23 NOTE — ED Provider Notes (Signed)
 [facility] Provider Note   CSN: [id number] Arrival date & time: 12/22/23  2312     Patient presents with: Chest Pain   Tiffany Strickland is a 68 y.o. female.   The history is provided by the patient.  Chest Pain  She has history of hypertension, diabetes, hyperlipidemia, stroke, pulmonary embolism anticoagulated on rivaroxaban  and comes in complaining of pressure feeling in her central and lower chest which started at about 11 AM and has been constant since then.  It does radiate into her back.  She denies dyspnea, nausea, diaphoresis.  Nothing makes the discomfort better or worse.  This is similar to what she had with a pulmonary we will do some in the past.  She states she has been compliant with her rivaroxaban .  She is also complaining of pain in her lower back and right buttock since falling 2 weeks ago.  She states the pain is actually getting worse.  She denies other injury.  She is a non-smoker, has no family history of premature coronary atherosclerosis.    Prior to Admission medications   Medication Sig Start Date End Date Taking? Authorizing Provider  Accu-Chek FastClix Lancets MISC SMARTSIG:1 Topical Daily 12/28/20   [provider]  amLODipine  (NORVASC ) 5 MG tablet Take 5 mg by mouth daily. 10/26/19   [provider]  amoxicillin -clavulanate (AUGMENTIN ) 875-125 MG tablet Take 1 tablet by mouth every 12 (twelve) hours. 10/21/23   Enedelia Dorna HERO, FNP  aspirin  (ASPIR-81) 81 MG EC tablet Take 1 tablet (81 mg total) by mouth daily. Swallow whole. 11/08/15   Newlin, Enobong, MD  Azelastine HCl 137 MCG/SPRAY SOLN Place 1 spray into both nostrils 2 (two) times daily as needed. 11/16/21   [provider]  benzonatate  (TESSALON ) 100 MG capsule Take 1 capsule (100 mg total) by mouth every 8 (eight) hours. 10/21/23   Enedelia Dorna HERO, FNP  Blood Glucose Monitoring Suppl (RELION PRIME MONITOR) DEVI Use as  directed once daily 08/06/18   Newlin, Enobong, MD  Blood Glucose Monitoring Suppl (TRUE METRIX METER) DEVI 1 each by Does not apply route daily before breakfast. 07/14/18   Delbert Clam, MD  docusate sodium  (COLACE) 100 MG capsule Take 1 capsule (100 mg total) by mouth 2 (two) times daily. 08/18/23 08/17/24  Laurence Locus, DO  fluticasone  (FLONASE ) 50 MCG/ACT nasal spray 1 spray in each nostril Nasally Twice a day    [provider]  glipiZIDE  (GLUCOTROL  XL) 10 MG 24 hr tablet Take 10 mg by mouth daily. 08/15/21   [provider]  glucose blood (RELION PRIME TEST) test strip Use as instructed once daily 08/06/18   Newlin, Enobong, MD  guaiFENesin  (MUCINEX ) 600 MG 12 hr tablet Take 1 tablet (600 mg total) by mouth 2 (two) times daily. 10/21/23   Enedelia Dorna HERO, FNP  hydrochlorothiazide  (HYDRODIURIL ) 25 MG tablet Take 1 tablet (25 mg total) by mouth daily. 07/07/18   Newlin, Enobong, MD  isosorbide  mononitrate (IMDUR ) 60 MG 24 hr tablet Take 60 mg by mouth daily.    [provider]  levothyroxine  (EUTHYROX ) 150 MCG tablet Take 1 tablet (150 mcg total) by mouth daily before breakfast. Must have office visit for refills. Patient taking differently: Take 75-150 mcg by mouth as directed. Take 1 tablet (150 mcg) daily except for Mon & Wed 11/10/18   Newlin, Enobong, MD  losartan  (COZAAR ) 100 MG tablet Take 1 tablet (100 mg total) by mouth daily. 07/07/18  Newlin, Enobong, MD  Multiple Vitamin (MULTIVITAMIN PO) Take 1 tablet by mouth daily.    [provider]  nystatin  (MYCOSTATIN /NYSTOP ) powder Apply 1 Application topically daily as needed. 04/23/23   [provider]  omeprazole  (PRILOSEC) 20 MG capsule Take 20 mg by mouth daily as needed.    [provider]  RIVAROXABAN  (XARELTO ) VTE STARTER PACK (15 & 20 MG) Follow package directions: Take one 15mg  tablet by mouth twice a day. On day 22, switch to one 20mg  tablet once a day. Take with food. 08/18/23   Laurence Locus, DO  rosuvastatin  (CRESTOR ) 10 MG tablet Take 10 mg by mouth at bedtime. 11/03/19   [provider]  spironolactone  (ALDACTONE ) 25 MG tablet Take 25 mg by mouth daily.    [provider]    Allergies: Pollen extract, Tomato, and Wool alcohol [lanolin]    Review of Systems  Cardiovascular:  Positive for chest pain.  All other systems reviewed and are negative.   Updated Vital Signs BP (!) 160/70   Pulse 83   Temp 97.8 F (36.6 C) (Oral)   Resp 20   SpO2 97%   Physical Exam Vitals and nursing note reviewed.   68 year old female, resting comfortably and in no acute distress. Vital signs are significant for elevated blood pressure. Oxygen saturation is 97%, which is normal. Head is normocephalic and atraumatic. PERRLA, EOMI. Neck is nontender and supple. Back is mildly tender in the upper thoracic and in the lumbar spine, also mild tenderness over the right posterior iliac crest. Lungs are clear without rales, wheezes, or rhonchi. Chest is nontender. Heart has regular rate and rhythm without murmur. Abdomen is soft, flat, nontender. Extremities have 2+ edema, full range of motion is present. Skin is warm and dry without rash. Neurologic: Mental status is normal, cranial nerves are intact, moves all extremities equally.  (all labs ordered are listed, but only abnormal results are displayed) Labs Reviewed  BASIC METABOLIC PANEL WITH GFR - Abnormal; Notable for the following components:      Result Value   Glucose, Bld 162 (*)    All other components within normal limits  CBC  TROPONIN T, HIGH SENSITIVITY    EKG: EKG Interpretation Date/Time:  Monday December 22 2023 23:48:21 EST Ventricular Rate:  75 PR Interval:  178 QRS Duration:  147 QT Interval:  448 QTC Calculation: 501 R Axis:   45  Text Interpretation: Sinus rhythm IVCD, consider atypical LBBB When compared with ECG of 09/11/2023, No significant change was found Confirmed by Raford Lenis  ([id number]) on 12/23/2023 12:20:58 AM  Radiology: CT ABDOMEN PELVIS W CONTRAST Result Date: 12/23/2023 EXAM: CT ABDOMEN AND PELVIS WITH CONTRAST 12/23/2023 02:35:47 AM TECHNIQUE: CT of the abdomen and pelvis was performed with the administration of 100 mL of iohexol  (OMNIPAQUE ) 350 MG/ML injection. Multiplanar reformatted images are provided for review. Automated exposure control, iterative reconstruction, and/or weight-based adjustment of the mA/kV was utilized to reduce the radiation dose to as low as reasonably achievable. COMPARISON: CTA abdomen/pelvis dated 08/16/2023. CLINICAL HISTORY: central chest pain, fall a week ago, right hip pain. FINDINGS: LOWER CHEST: Tiny hiatal hernia. LIVER: Subcentimeter cyst in the right hepatic dome, benign. GALLBLADDER AND BILE DUCTS: Gallbladder is unremarkable. No biliary ductal dilatation. SPLEEN: No acute abnormality. PANCREAS: No acute abnormality. ADRENAL GLANDS: No acute abnormality. KIDNEYS, URETERS AND BLADDER: No stones in the kidneys or ureters. No hydronephrosis. No perinephric or periureteral stranding. Mildly thick-walled bladder, although underdistended. GI AND BOWEL:  Stomach demonstrates no acute abnormality. Moderate duodenal diverticulum. Normal appendix (image 45). There is no bowel obstruction. PERITONEUM AND RETROPERITONEUM: No ascites. No free air. VASCULATURE: Aorta is normal in caliber. LYMPH NODES: No lymphadenopathy. REPRODUCTIVE ORGANS: Status post hysterectomy. BONES AND SOFT TISSUES: Left hemisacralization of the L5 vertebral body. No acute osseous abnormality. No focal soft tissue abnormality. IMPRESSION: 1. No acute findings. Electronically signed by: Pinkie Pebbles MD 12/23/2023 02:44 AM EST RP Workstation: [id number]   CT Angio Chest PE W and/or Wo Contrast Result Date: 12/23/2023 EXAM: CTA of the Chest with contrast for PE 12/23/2023 02:35:47 AM TECHNIQUE: CTA of the chest was performed without and with the administration of 100 mL of  iohexol  (OMNIPAQUE ) 350 MG/ML injection. Multiplanar reformatted images are provided for review. MIP images are provided for review. Automated exposure control, iterative reconstruction, and/or weight based adjustment of the mA/kV was utilized to reduce the radiation dose to as low as reasonably achievable. COMPARISON: 09/12/2023 CLINICAL HISTORY: Pulmonary embolism (PE) suspected, high prob. FINDINGS: PULMONARY ARTERIES: Pulmonary arteries are adequately opacified for evaluation. No pulmonary embolism. Enlarged the main pulmonary artery, suggesting pulmonary arterial hypertension. MEDIASTINUM: The heart and pericardium demonstrate no acute abnormality. Mild cardiomegaly. No thoracic aortic aneurysm or dissection. LYMPH NODES: No mediastinal, hilar or axillary lymphadenopathy. LUNGS AND PLEURA: The lungs are without acute process. No focal consolidation or pulmonary edema. No pleural effusion or pneumothorax. UPPER ABDOMEN: Limited images of the upper abdomen demonstrate a renal calculus. SOFT TISSUES AND BONES: No acute soft tissue abnormality. Mild degenerative changes mid to lower thoracic spine. IMPRESSION: 1. No pulmonary embolism. 2. Enlarged main pulmonary artery, suggesting pulmonary arterial hypertension. 3. No acute findings. Electronically signed by: Pinkie Pebbles MD 12/23/2023 02:39 AM EST RP Workstation: [id number]   DG Chest 2 View Result Date: 12/22/2023 CLINICAL DATA:  Chest pain short of breath EXAM: CHEST - 2 VIEW COMPARISON:  10/21/2023 FINDINGS: Stable cardiomediastinal silhouette. Prominent central pulmonary vessels. No acute airspace disease, pleural effusion, or pneumothorax. IMPRESSION: No active cardiopulmonary disease. Prominent central pulmonary vessels suggesting arterial hypertension Electronically Signed   By: Luke Bun M.D.   On: 12/22/2023 23:37   Cardiac monitor shows normal sinus rhythm, per my interpretation. Procedures   Medications Ordered in the ED  iohexol   (OMNIPAQUE ) 350 MG/ML injection 100 mL (100 mLs Intravenous Contrast Given 12/23/23 0205)                                    Medical Decision Making Amount and/or Complexity of Data Reviewed Labs: ordered. Radiology: ordered.  Risk Prescription drug management.   Chest discomfort.  This is a presentation with wide range of treatment options and carries with it a high risk of morbidity and complications.  Differential diagnosis includes, but is not limited to, ACS, pulmonary embolism, GERD, pneumonia, pericarditis.  Back pain and pain in right posterior iliac crests following fall likely contusions but concerning that symptoms seem to be getting worse.  I have reviewed her electrocardiogram, and my interpretation is left bundle branch block unchanged from prior.  Chest x-ray shows no active cardiopulmonary disease.  I have independently viewed the images, and agree with the radiologist's interpretation.  Since symptoms are so similar to prior pulmonary embolism, I feel it is necessary to obtain CT angiogram to rule out recurrent pulmonary embolism even though she is on anticoagulation.  Also, with complaints of back and pelvic pain following fall, I  am ordering CT of abdomen and pelvis.  I have reviewed her past records, and note nuclear stress test on 09/09/2018 which did show a small reversible perfusion defect of the anterior wall toward the apex.  I have reviewed her laboratory tests, and my interpretation is normal basic metabolic panel except for minimally elevated glucose, normal troponin, normal CBC.  CT angiogram of the chest shows no pulmonary embolism, no acute findings.  CT of abdomen and pelvis shows no acute findings.  I have independently reviewed all the images, and agree with the radiologist's interpretation.  I am referring her back to her cardiologist for further outpatient evaluation.  I have advised her to apply ice and use acetaminophen  as needed for pain.  Return precautions  discussed.     Final diagnoses:  Nonspecific chest pain  Fall, initial encounter  Acute midline low back pain without sciatica  Chronic anticoagulation    ED Discharge Orders     None          Raford Lenis, MD 12/23/23 [phone]

## 2023-12-25 NOTE — Progress Notes (Signed)
 1. No-show for appointment

## 2023-12-31 ENCOUNTER — Encounter: Payer: Self-pay | Admitting: Physical Therapy

## 2023-12-31 ENCOUNTER — Ambulatory Visit: Admitting: Physical Therapy

## 2023-12-31 DIAGNOSIS — M6281 Muscle weakness (generalized): Secondary | ICD-10-CM

## 2023-12-31 DIAGNOSIS — R262 Difficulty in walking, not elsewhere classified: Secondary | ICD-10-CM

## 2023-12-31 DIAGNOSIS — M25662 Stiffness of left knee, not elsewhere classified: Secondary | ICD-10-CM

## 2023-12-31 DIAGNOSIS — M25561 Pain in right knee: Secondary | ICD-10-CM | POA: Diagnosis not present

## 2023-12-31 DIAGNOSIS — M25661 Stiffness of right knee, not elsewhere classified: Secondary | ICD-10-CM

## 2023-12-31 DIAGNOSIS — G8929 Other chronic pain: Secondary | ICD-10-CM

## 2023-12-31 NOTE — Therapy (Signed)
 OUTPATIENT PHYSICAL THERAPY LOWER EXTREMITY   Patient Name: Tiffany Strickland MRN: [id number] DOB:11-09-1955, 68 y.o., female Today's Date: 12/31/2023  END OF SESSION:  PT End of Session - 12/31/23 0758     Visit Number 9    Date for Recertification  12/25/23    PT Start Time 0800    PT Stop Time 0845    PT Time Calculation (min) 45 min    Activity Tolerance Patient tolerated treatment well    Behavior During Therapy [facility] for tasks assessed/performed          Past Medical History:  Diagnosis Date   Colon polyp    adenomatous   Diabetes mellitus without complication (HCC)    Hyperlipidemia    Hypertension    Pulmonary embolism (HCC)    Sleep apnea    Stroke (HCC)    Thyroid  disease    Past Surgical History:  Procedure Laterality Date   ABDOMINAL HYSTERECTOMY     BLADDER SUSPENSION     COLONOSCOPY WITH PROPOFOL  N/A 05/05/2020   Procedure: COLONOSCOPY WITH PROPOFOL ;  Surgeon: Rollin Dover, MD;  Location: [facility];  Service: Endoscopy;  Laterality: N/A;   EUS N/A 08/12/2013   Procedure: LOWER ENDOSCOPIC ULTRASOUND (EUS);  Surgeon: Toribio SHAUNNA Cedar, MD;  Location: [facility];  Service: Endoscopy;  Laterality: N/A;   KNEE SURGERY     POLYPECTOMY  05/05/2020   Procedure: POLYPECTOMY;  Surgeon: Rollin Dover, MD;  Location: [facility];  Service: Endoscopy;;   TUBAL LIGATION     Patient Active Problem List   Diagnosis Date Noted   Acute pulmonary embolism (HCC) 08/16/2023   Acute kidney injury 08/16/2023   Posterior neck pain 05/29/2021   Chronic idiopathic constipation 06/21/2020   Diverticular disease of colon 06/21/2020   Change in bowel habit 03/15/2020   Epigastric pain 03/15/2020   Gastroesophageal reflux disease 03/15/2020   Other specified bacterial intestinal infections 03/15/2020   Pulmonary hypertension (HCC) 06/29/2018   Educated about COVID-19 virus infection 06/29/2018   OSA (obstructive sleep apnea) 02/03/2017   Hypothyroidism 02/02/2017    Chest pain 02/02/2017   Obesity, Class III, BMI 40-49.9 (morbid obesity) (HCC) 01/27/2017   Plantar fasciitis of left foot 11/08/2015   Non-insulin  treated type 2 diabetes mellitus (HCC) 06/08/2015   Colon cancer screening 06/08/2015   Bilateral leg edema 12/15/2014   Blurry vision, bilateral 06/16/2014   Breast cancer screening 06/16/2014   Other specified hypothyroidism 06/16/2014   Other seasonal allergic rhinitis 06/16/2014   History of stroke 01/31/2014   Rectal nodule 09/06/2013   Essential hypertension 08/24/2013   Nonspecific (abnormal) findings on radiological and other examination of gastrointestinal tract 08/12/2013   Lateral epicondylitis of right elbow 07/21/2013   Right shoulder pain 07/21/2013   Sprain of wrist, right 07/21/2013   Constipation 04/15/2013   Rectal bleeding 04/15/2013    PCP: Rik Glinda DASEN, FNP  REFERRING PROVIDER: Rik Glinda DASEN, FNP  REFERRING DIAG: [phone] (ICD-10-CM) - Pain in left knee  THERAPY DIAG:  Chronic pain of right knee  Chronic pain of left knee  Stiffness of right knee, not elsewhere classified  Difficulty in walking, not elsewhere classified  Muscle weakness (generalized)  Stiffness of left knee, not elsewhere classified  Rationale for Evaluation and Treatment: Rehabilitation  ONSET DATE:  Years off and on   SUBJECTIVE:   SUBJECTIVE STATEMENT:  going ok, yesterday was a bad day knees and ankle been hurting all day   I've had this issue for years off and  on. Both knees hurt when I put pressure on it. I think I did too much one day and that's why I ended up seeing the doctor. Have had therapy for knees in the past but one time a baker's cyst developed and I ended up getting worse.    PERTINENT HISTORY:  See above  PAIN:  Are you having pain? Back, knees, and ankle 4-5/10.  PRECAUTIONS: Other: active blood thinners due to recent PE   RED FLAGS: None   WEIGHT BEARING RESTRICTIONS: No  FALLS:  Has patient  fallen in last 6 months? Yes. 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  DIAGNOSTIC FINDINGS:   Narrative & Impression CLINICAL DATA:  Chest pain   EXAM: PORTABLE CHEST 1 VIEW   COMPARISON:  Chest x-ray 08/16/2023   FINDINGS: The heart is mildly enlarged. The lungs are clear. There is no pleural effusion or pneumothorax. No acute fractures are seen.   IMPRESSION: Mild cardiomegaly. No acute pulmonary process.  IMPRESSION: 1. No pulmonary embolism. No acute intrathoracic pathology identified. 2. Morphologic changes in keeping with pulmonary arterial hypertension. 3. Mild cardiomegaly. 4. Mild hepatic steatosis.   PATIENT SURVEYS:   LEFS 46/80   COGNITION: Overall cognitive status: Within functional limits for tasks assessed     FLEXIBILITY  Tight hip ER groups, consistent toe out with gait and static positions Tight HS and hip flexors per observation     LOWER EXTREMITY ROM:  Active ROM Right eval Left eval Right AROM 11/14/23 Left AROM 11/14/23 RT/left 11/27/23 RT/Left 12/11/23 RT/Left 12/31/23  Hip flexion         Hip extension         Hip abduction         Hip adduction         Hip internal rotation         Hip external rotation         Knee flexion 103* 109* 102 100 110/115 108/98 100/106  Knee extension 9* 12* 3 0 0/0    Ankle dorsiflexion         Ankle plantarflexion         Ankle inversion          Ankle eversion          (Blank rows = not tested)  LOWER EXTREMITY MMT:  MMT Right eval Left eval Right 11/14/23 Left 11/14/23 RT/Left RT /left 12/11/23 RT/Left 12/31/23  Hip flexion 4+ 4+ 5 5     Hip extension         Hip abduction 3 3+ 5 5     Hip adduction         Hip internal rotation         Hip external rotation         Knee flexion 4 4 4+ 4 4+/4+ 4+ BIL with pain 4+ bilat   Knee extension 4 4 4+ 4+ 4+/4+ 4+ BIL with pain 5/4+  Ankle dorsiflexion         Ankle plantarflexion         Ankle inversion         Ankle eversion          (Blank rows = not tested)                                                                                                                                  TREATMENT DATE:  12/31/23 Sit to stands 2x10 HS curl 35# 2 sets 12 Knee ext 15# 2 sets 12 NuStep L5 x 6 min Resisted forward and backward walking 30lb Shoulder Ext 5lb 2x10 Hip add ball squeeze  12/18/23 Nustep L 5 - verb 8/10 pain pain she arrived with was gone after this MMT grossly tested BIL LE hip and knee WFL without pain AROM BIL knee- see below BERG 50/56 HS curl 35# 2 sets 10 Knee ext 15# 2 sets 10 Back ext black band 2 sets 10 Working on stairs , step over step  4 in and  6 in 2 rail , then 1 rail 6 inch step up BIL UE support step up with opp leg ext then laterally with opp leg abd STS with wt ball chest press 10 x   12/11/23 Nustep L 5 6 min MMT/ROM and goals assessed and documented Seated dyna disc 4 way 10 x each for lumbar ROM 3# on dyna disc 2 sets 10 LAQ,hip flex and hip abd Green tband HS curl 2 sets 10 STS with wt ball press 10 x 30 # resisted gait 4 ways 4 x each   11/27/23 Nustep L 5 LE only STS with wt ball press 10x 2 sets Farmers carry 8# 2 laps each hand with seated rest needed btwn 6 inch step up with opp leg ext 10 x- with UE support 6 inch lateral step up with opp leg abd 10x with UE support- hip weakness noted with lateral step up  vs fwd L> RT Foam beam in // bars side stepping 5 x each way with light HHA, then tandem fwd and backward 5 x Knee ext 15 # 2 sets 10 HS curl 30# 2 sets 10   11/25/23 Resisted gait 40# 4 ways 5 x each Black bar heel raises 2 sets 10 Knee ext 10 # 2 sets 10 HS curl 25# 2 sets 10 6 inch step up 10 x BIL the side step up 10 x BIL- with UE support Seated blue tband hip flex and abd 2 sets 10 STS 10 x with wt ball 10 x 2 sets Nustep L 5  LE only    11/21/23 AROM Left knee  0-105 AROM RT  knee 0-102 Goals assessed LAQ 3# 2 sets 10 Standing with UE support 3# 12 x HS curl, hip ext,hip abd and hip flexion 3# marching fwd and walking backward 10 feet 5 x each 3# side  stepping 5 x 10 feet Leg Press 40# 2 sets 10. Calf raises 2 sets 10 STS with wt ball 2 sets 10 Resisted gait 30# 4 ways 5 x each      11/14/23 NuStep L5 x 6 min PROM to bilateral knees  Patellar mobs (difficulty relaxing) Sit to stands 2x10 6 in step ups 2x5 30lb resisted side steps x5 each   11/06/23 Nustep L 6 5 min Resisted gait fwd and back 4 x each then laterally 3 x 20# Black bar heel raises  15 x ( attempted toe raise but unable) Red tband DF 2 sets 10 BIL Red tband HS curls 2 sets 10 3# LAQ 2 sets 10 Standing 3# SL hip flex,ext and abd 10 x each STS 10 x from mat     10/30/23  Eval, POC, HEP and education as below    Nustep L4x6 minutes all four extremities, seat 10  HEP practice and discussion    PATIENT EDUCATION:  Education details: exam findings, POC, HEP  Person educated: Patient Education method: Programmer, Multimedia, Demonstration, and Handouts Education comprehension: verbalized understanding, returned demonstration, and needs further education  HOME EXERCISE PROGRAM:  Access Code: [email or web address] URL: [email or web address] Date: 10/30/2023 Prepared by: Josette Rough  Exercises - Supine Figure 4 Piriformis Stretch  - 1 x daily - 7 x weekly - 2 sets - 4-6 reps - 30  seconds  hold - Supine Bridge with Resistance Band  - 1 x daily - 7 x weekly - 2 sets - 10 reps - 2 seconds  hold - Clamshell with Resistance  - 1 x daily - 7 x weekly - 2 sets - 10 reps - 1 second  hold - Seated Knee Extension with Anchored Resistance  - 1 x daily - 7 x weekly - 2 sets - 10 reps - 2 seconds  hold  ASSESSMENT:  CLINICAL IMPRESSION:  Pt enters with reported of continues low back and knee pain with movement. She stated she has always had low back pain bit the fall made it worst. Slight improvement made with knee ROM and strength. Cues for full ROM needed with leg curls and ext. An increase in LBP reported with resisted gait. Will continue to progress as tolerated.   Patient is a 68 y.o. F who was seen today for physical therapy evaluation and treatment for M25.562 (ICD-10-CM) - Pain in left knee. Objectives as above. Has recent history of PE due to sedentary period, also has history of Baker's cyst and limited success in PT in the past due to this. Will make every effort to improve pain and function moving forward.  OBJECTIVE IMPAIRMENTS: Abnormal gait, decreased activity tolerance, decreased mobility, difficulty walking, decreased ROM, decreased strength, impaired flexibility, obesity, and pain.   ACTIVITY LIMITATIONS: standing, squatting, stairs, transfers, and locomotion level  PARTICIPATION LIMITATIONS: meal prep, cleaning, laundry, driving, shopping, community activity, and yard work  PERSONAL FACTORS: Age, Behavior pattern, Education, Fitness, Past/current experiences, Social background, and Time since onset of injury/illness/exacerbation are also affecting patient's functional outcome.   REHAB POTENTIAL: Fair body habitus, chronicity of pain, limited success with PT in the past   CLINICAL DECISION MAKING: Stable/uncomplicated  EVALUATION COMPLEXITY: Low   GOALS: Goals reviewed with patient? No  SHORT TERM GOALS: Target date: 11/27/2023   Will be compliant with  appropriate progressive HEP Goal status: 11/06/23 MET for initial HEP   2. Knee AROM flexion to be at least 115* B and extension AROM to be no more than 5* B  Goal status: Ongoing 11/14/23  and 11/21/23  11/27/23 met for ext and progressing for flexion . 12/18/23  Rt knee AROM   0-108               Left knee AROM 2-110   3. Will be independent with BLE edema management strategies  Goal status: progressing 11/21/23  10/16 /25 MET as she can verb tech but edema still an issue   4. Gait pattern to have normalized to include consistent step through pattern with good heel-toe mechanics, no toe out with gait Goal status: Progressing 11/14/23     11/27/23 progressing 12/11/23 progressing  12/18/23 progressing  LONG TERM GOALS: Target date: 12/25/2023    MMT to have improved by one grade all weak groups Goal status: Progressing 11/14/23  and 11/27/23 12/11/23 slight set back after fall , increased pain 12/18/23 MET   2. Will score at least 45 on Berg to show reduced fall risk  Goal status: 12/18/23 MET   3. Will be able to ascend and descend stairs reciprocally with no increase in pain Goal status: 12/18/23 progressing- still limited, Ongoing 12/31/23   4. Will be able to ambulate community distances and perform all household tasks with no increase in pain  Goal status: 12/11/23 progressing, ongoing 12/31/23   5. LEFS to have improved by at least 10 points to show improved QOL and subjective improvement     PLAN:  PT FREQUENCY: 2x/week  PT DURATION: 8 weeks  PLANNED INTERVENTIONS: 97750- Physical Performance Testing, 97110-Therapeutic exercises, 97530- Therapeutic activity, [id number]- Neuromuscular re-education, 97535- Self Care, [id number]- Manual therapy, [id number]- Gait training, [phone]- Aquatic Therapy, [phone]- Vasopneumatic device, and Patient/Family education  PLAN FOR NEXT SESSION: progress strength and func gait .  Tanda Sorrow, PTA 12/31/23 7:59 AM

## 2024-01-02 ENCOUNTER — Ambulatory Visit: Admitting: Physical Therapy

## 2024-01-02 DIAGNOSIS — R262 Difficulty in walking, not elsewhere classified: Secondary | ICD-10-CM

## 2024-01-02 DIAGNOSIS — G8929 Other chronic pain: Secondary | ICD-10-CM

## 2024-01-02 DIAGNOSIS — M6281 Muscle weakness (generalized): Secondary | ICD-10-CM

## 2024-01-02 DIAGNOSIS — M25561 Pain in right knee: Secondary | ICD-10-CM | POA: Diagnosis not present

## 2024-01-02 DIAGNOSIS — M25661 Stiffness of right knee, not elsewhere classified: Secondary | ICD-10-CM

## 2024-01-02 NOTE — Therapy (Addendum)
 OUTPATIENT PHYSICAL THERAPY LOWER EXTREMITY Progress Note Reporting Period 10/30/23 to 01/02/24  See note below for Objective Data and Assessment of Progress/Goals.      Patient Name: Tiffany Strickland MRN: [id number] DOB:1955-10-27, 68 y.o., female Today's Date: 01/02/2024  END OF SESSION:  PT End of Session - 01/02/24 0752     Visit Number 10    Date for Recertification  01/30/24    PT Start Time 0751    PT Stop Time 0835    PT Time Calculation (min) 44 min          Past Medical History:  Diagnosis Date   Colon polyp    adenomatous   Diabetes mellitus without complication (HCC)    Hyperlipidemia    Hypertension    Pulmonary embolism (HCC)    Sleep apnea    Stroke (HCC)    Thyroid  disease    Past Surgical History:  Procedure Laterality Date   ABDOMINAL HYSTERECTOMY     BLADDER SUSPENSION     COLONOSCOPY WITH PROPOFOL  N/A 05/05/2020   Procedure: COLONOSCOPY WITH PROPOFOL ;  Surgeon: Rollin Dover, MD;  Location: [facility];  Service: Endoscopy;  Laterality: N/A;   EUS N/A 08/12/2013   Procedure: LOWER ENDOSCOPIC ULTRASOUND (EUS);  Surgeon: Toribio SHAUNNA Cedar, MD;  Location: [facility];  Service: Endoscopy;  Laterality: N/A;   KNEE SURGERY     POLYPECTOMY  05/05/2020   Procedure: POLYPECTOMY;  Surgeon: Rollin Dover, MD;  Location: [facility];  Service: Endoscopy;;   TUBAL LIGATION     Patient Active Problem List   Diagnosis Date Noted   Acute pulmonary embolism (HCC) 08/16/2023   Acute kidney injury 08/16/2023   Posterior neck pain 05/29/2021   Chronic idiopathic constipation 06/21/2020   Diverticular disease of colon 06/21/2020   Change in bowel habit 03/15/2020   Epigastric pain 03/15/2020   Gastroesophageal reflux disease 03/15/2020   Other specified bacterial intestinal infections 03/15/2020   Pulmonary hypertension (HCC) 06/29/2018   Educated about COVID-19 virus infection 06/29/2018   OSA (obstructive sleep apnea) 02/03/2017   Hypothyroidism  02/02/2017   Chest pain 02/02/2017   Obesity, Class III, BMI 40-49.9 (morbid obesity) (HCC) 01/27/2017   Plantar fasciitis of left foot 11/08/2015   Non-insulin  treated type 2 diabetes mellitus (HCC) 06/08/2015   Colon cancer screening 06/08/2015   Bilateral leg edema 12/15/2014   Blurry vision, bilateral 06/16/2014   Breast cancer screening 06/16/2014   Other specified hypothyroidism 06/16/2014   Other seasonal allergic rhinitis 06/16/2014   History of stroke 01/31/2014   Rectal nodule 09/06/2013   Essential hypertension 08/24/2013   Nonspecific (abnormal) findings on radiological and other examination of gastrointestinal tract 08/12/2013   Lateral epicondylitis of right elbow 07/21/2013   Right shoulder pain 07/21/2013   Sprain of wrist, right 07/21/2013   Constipation 04/15/2013   Rectal bleeding 04/15/2013    PCP: Rik Glinda DASEN, FNP  REFERRING PROVIDER: Rik Glinda DASEN, FNP  REFERRING DIAG: [phone] (ICD-10-CM) - Pain in left knee  THERAPY DIAG:  Chronic pain of right knee  Chronic pain of left knee  Stiffness of right knee, not elsewhere classified  Difficulty in walking, not elsewhere classified  Muscle weakness (generalized)  Rationale for Evaluation and Treatment: Rehabilitation  ONSET DATE:  Years off and on   SUBJECTIVE:   SUBJECTIVE STATEMENT:  RT lateral knee 8/10 and BIL heels 7-8/10. No sure if weather or not. Back still bad from fall so messaged MD about getting script to tx back as well.  I've had this issue for years off and on. Both knees hurt when I put pressure on it. I think I did too much one day and that's why I ended up seeing the doctor. Have had therapy for knees in the past but one time a baker's cyst developed and I ended up getting worse.    PERTINENT HISTORY:  See above  PAIN:  Are you having pain? Back, knees, and ankle 4-5/10.  PRECAUTIONS: Other: active blood thinners due to recent PE   RED FLAGS: None   WEIGHT BEARING  RESTRICTIONS: No  FALLS:  Has patient fallen in last 6 months? Yes. 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  DIAGNOSTIC FINDINGS:   Narrative & Impression CLINICAL DATA:  Chest pain   EXAM: PORTABLE CHEST 1 VIEW   COMPARISON:  Chest x-ray 08/16/2023   FINDINGS: The heart is mildly enlarged. The lungs are clear. There is no pleural effusion or pneumothorax. No acute fractures are seen.   IMPRESSION: Mild cardiomegaly. No acute pulmonary process.  IMPRESSION: 1. No pulmonary embolism. No acute intrathoracic pathology identified. 2. Morphologic changes in keeping with pulmonary arterial hypertension. 3. Mild cardiomegaly. 4. Mild hepatic steatosis.   PATIENT SURVEYS:   LEFS 46/80   COGNITION: Overall cognitive status: Within functional limits for tasks assessed     FLEXIBILITY  Tight hip ER groups, consistent toe out with gait and static positions Tight HS and hip flexors per observation     LOWER EXTREMITY ROM:  Active ROM Right eval Left eval Right AROM 11/14/23 Left AROM 11/14/23 RT/left 11/27/23 RT/Left 12/11/23 RT/Left 12/31/23  Hip flexion         Hip extension         Hip abduction         Hip adduction         Hip internal rotation         Hip external rotation         Knee flexion 103* 109* 102 100 110/115 108/98 100/106  Knee extension 9* 12* 3 0 0/0    Ankle dorsiflexion         Ankle  plantarflexion         Ankle inversion         Ankle eversion          (Blank rows = not tested)  LOWER EXTREMITY MMT:  MMT Right eval Left eval Right 11/14/23 Left 11/14/23 RT/Left RT /left 12/11/23 RT/Left 12/31/23  Hip flexion 4+ 4+ 5 5     Hip extension         Hip abduction 3 3+ 5 5     Hip adduction         Hip internal rotation         Hip external rotation         Knee flexion 4 4 4+ 4 4+/4+ 4+ BIL with pain 4+ bilat   Knee extension 4 4  4+ 4+ 4+/4+ 4+ BIL with pain 5/4+  Ankle dorsiflexion         Ankle plantarflexion         Ankle inversion         Ankle eversion          (Blank rows = not tested)                                                                                                                                  TREATMENT DATE:   01/02/24 BERG 51/56 Black bar heel raises 2 sets 10and toe raises 8 x and stopped with pain Slant board stretch 3 x 20 sec Leg press 30# 2 sets 10. Calf raises 30# 2 sets 10 Cable pulley shld ext 5# 2 sets 10 Cable row 10# 2 sets 10 3# LAQ with ADD ball squeeze 2 sets 10 Green tband HS curl 2 sets 10 Green tband clams and hip flexion 2 sets 10   12/31/23 Sit to stands 2x10 HS curl 35# 2 sets 12 Knee ext 15# 2 sets 12 NuStep L5 x 6 min Resisted forward and backward walking 30lb Shoulder Ext 5lb 2x10 Hip add ball squeeze  12/18/23 Nustep L 5 - verb 8/10 pain pain she arrived with was gone after this MMT grossly tested BIL LE hip and knee WFL without pain AROM BIL knee- see below BERG 50/56 HS curl 35# 2 sets 10 Knee ext 15# 2 sets 10 Back ext black band 2 sets 10 Working on stairs , step over step  4 in and  6 in 2 rail , then 1 rail 6 inch step up BIL UE support step up with opp leg ext then laterally with opp leg abd STS with wt ball chest press 10 x   12/11/23 Nustep L 5 6 min MMT/ROM and goals assessed and documented Seated dyna disc 4 way 10 x each for lumbar ROM 3# on dyna disc 2 sets 10 LAQ,hip  flex and hip abd Green tband HS curl 2 sets 10 STS with wt ball press 10 x 30 # resisted gait 4 ways 4 x each   11/27/23 Nustep L 5 LE only STS with wt ball press 10x 2 sets Farmers carry 8# 2 laps each hand with seated rest needed btwn 6 inch step up with opp leg ext 10 x- with UE support 6 inch lateral step up with opp leg abd 10x with UE support- hip weakness noted with lateral step up vs fwd L> RT Foam beam in // bars side stepping 5 x each way with light HHA, then tandem fwd and backward 5 x Knee ext 15 # 2 sets 10 HS curl 30# 2 sets 10   11/25/23 Resisted gait 40# 4 ways 5 x each Black bar heel raises 2 sets 10 Knee ext 10 # 2 sets 10 HS curl 25# 2 sets 10 6 inch step up 10  x BIL the side step up 10 x BIL- with UE support Seated blue tband hip flex and abd 2 sets 10 STS 10 x with wt ball 10 x 2 sets Nustep L 5  LE only    11/21/23 AROM Left knee  0-105 AROM RT  knee 0-102 Goals assessed LAQ 3# 2 sets 10 Standing with UE support 3# 12 x HS curl, hip ext,hip abd and hip flexion 3# marching fwd and walking backward 10 feet 5 x each 3# side stepping 5 x 10 feet Leg Press 40# 2 sets 10. Calf raises 2 sets 10 STS with wt ball 2 sets 10 Resisted gait 30# 4 ways 5 x each      11/14/23 NuStep L5 x 6 min PROM to bilateral knees  Patellar mobs (difficulty relaxing) Sit to stands 2x10 6 in step ups 2x5 30lb resisted side steps x5 each   11/06/23 Nustep L 6 5 min Resisted gait fwd and back 4 x each then laterally 3 x 20# Black bar heel raises  15 x ( attempted toe raise but unable) Red tband DF 2 sets 10 BIL Red tband HS curls 2 sets 10 3# LAQ 2 sets 10 Standing 3# SL hip flex,ext and abd 10 x each STS 10 x from mat     10/30/23  Eval, POC, HEP and education as below    Nustep L4x6 minutes all four extremities, seat 10  HEP practice and discussion    PATIENT EDUCATION:  Education details: exam findings, POC, HEP  Person educated:  Patient Education method: Programmer, Multimedia, Demonstration, and Handouts Education comprehension: verbalized understanding, returned demonstration, and needs further education  HOME EXERCISE PROGRAM:  Access Code: [email or web address] URL: [email or web address] Date: 10/30/2023 Prepared by: Josette Rough  Exercises - Supine Figure 4 Piriformis Stretch  - 1 x daily - 7 x weekly - 2 sets - 4-6 reps - 30 seconds  hold - Supine Bridge with Resistance Band  - 1 x daily - 7 x weekly - 2 sets - 10 reps - 2 seconds  hold - Clamshell with Resistance  - 1 x daily - 7 x weekly - 2 sets - 10 reps - 1 second  hold - Seated Knee Extension with Anchored Resistance  - 1 x daily - 7 x weekly - 2 sets - 10 reps - 2 seconds  hold  ASSESSMENT:  CLINICAL IMPRESSION:  Pt enters with reported of RT lateral knee 8/10 and BIL heels 7-8/10. No sure if weather or not. Back still bad from fall so messaged MD about getting script to tx back as well. BERG 51/56.Goals assessed last visit for renewal. Pnt is making slow progress and has had a slight set back since fall and increased pain.Pnt mvmt were slow and antalgic.  Patient is a 68 y.o. F who was seen today for physical therapy evaluation and treatment for M25.562 (ICD-10-CM) - Pain in left knee. Objectives as above. Has recent history of PE due to sedentary period, also has history of Baker's cyst and limited success in PT in the past due to this. Will make every effort to improve pain and function moving forward.  OBJECTIVE IMPAIRMENTS: Abnormal gait, decreased activity tolerance, decreased mobility, difficulty walking, decreased ROM, decreased strength, impaired flexibility, obesity, and pain.   ACTIVITY LIMITATIONS: standing, squatting, stairs, transfers, and locomotion level  PARTICIPATION LIMITATIONS: meal prep, cleaning, laundry, driving, shopping, community activity, and yard work  PERSONAL FACTORS: Age, Behavior pattern, Education, Fitness, Past/current  experiences, Social background, and  Time since onset of injury/illness/exacerbation are also affecting patient's functional outcome.   REHAB POTENTIAL: Fair body habitus, chronicity of pain, limited success with PT in the past   CLINICAL DECISION MAKING: Stable/uncomplicated  EVALUATION COMPLEXITY: Low   GOALS: Goals reviewed with patient? No  SHORT TERM GOALS: Target date: 11/27/2023   Will be compliant with appropriate progressive HEP Goal status: 11/06/23 MET for initial HEP   2. Knee AROM flexion to be at least 115* B and extension AROM to be no more than 5* B  Goal status: Ongoing 11/14/23  and 11/21/23  11/27/23 met for ext and progressing for flexion . 12/18/23  Rt knee AROM   0-108               Left knee AROM 2-110   3. Will be independent with BLE edema management strategies  Goal status: progressing 11/21/23  10/16 /25 MET as she can verb tech but edema still an issue   4. Gait pattern to have normalized to include consistent step through pattern with good heel-toe mechanics, no toe out with gait Goal status: Progressing 11/14/23     11/27/23 progressing 12/11/23 progressing  12/18/23 progressing  LONG TERM GOALS: Target date: 01/30/24    MMT to have improved by one grade all weak groups Goal status: Progressing 11/14/23  and 11/27/23 12/11/23 slight set back after fall , increased pain 12/18/23 MET   2. Will score at least 45 on Berg to show reduced fall risk  Goal status: 12/18/23 MET 01/02/24 51/56   3. Will be able to ascend and descend stairs reciprocally with no increase in pain Goal status: 12/18/23 progressing- still limited, Ongoing 12/31/23   4. Will be able to ambulate community distances and perform all household tasks with no increase in pain  Goal status: 12/11/23 progressing, ongoing 12/31/23   5. LEFS to have improved by at least 10 points to show improved QOL and subjective improvement     PLAN:  PT FREQUENCY: 2x/week  PT DURATION: 8  weeks  PLANNED INTERVENTIONS: 97750- Physical Performance Testing, 97110-Therapeutic exercises, 97530- Therapeutic activity, [id number]- Neuromuscular re-education, 97535- Self Care, [id number]- Manual therapy, [id number]- Gait training, [phone]- Aquatic Therapy, [phone]- Vasopneumatic device, and Patient/Family education  PLAN FOR NEXT SESSION: progress strength and func gait .  Jon Or, PTA 01/02/24 8:14 AM

## 2024-01-05 ENCOUNTER — Ambulatory Visit

## 2024-01-05 ENCOUNTER — Other Ambulatory Visit: Payer: Self-pay

## 2024-01-05 ENCOUNTER — Other Ambulatory Visit (HOSPITAL_COMMUNITY): Payer: Self-pay

## 2024-01-05 DIAGNOSIS — M25661 Stiffness of right knee, not elsewhere classified: Secondary | ICD-10-CM

## 2024-01-05 DIAGNOSIS — M25662 Stiffness of left knee, not elsewhere classified: Secondary | ICD-10-CM

## 2024-01-05 DIAGNOSIS — M6281 Muscle weakness (generalized): Secondary | ICD-10-CM

## 2024-01-05 DIAGNOSIS — R262 Difficulty in walking, not elsewhere classified: Secondary | ICD-10-CM

## 2024-01-05 DIAGNOSIS — G8929 Other chronic pain: Secondary | ICD-10-CM

## 2024-01-05 DIAGNOSIS — M25561 Pain in right knee: Secondary | ICD-10-CM | POA: Diagnosis not present

## 2024-01-05 NOTE — Therapy (Signed)
 OUTPATIENT PHYSICAL THERAPY LOWER EXTREMITY   Patient Name: Tiffany Strickland MRN: [id number] DOB:[birth year], 68 y.o., female Today's Date: 01/05/2024  END OF SESSION:  PT End of Session - 01/05/24 0812     Visit Number 11    Date for Recertification  01/30/24    Authorization Type [employer] Dual    Authorization Time Period 10/30/23 to 12/25/23    Progress Note Due on Visit 20    PT Start Time 0802    PT Stop Time 0845    PT Time Calculation (min) 43 min    Activity Tolerance Patient tolerated treatment well    Behavior During Therapy [facility] for tasks assessed/performed           Past Medical History:  Diagnosis Date   Colon polyp    adenomatous   Diabetes mellitus without complication (HCC)    Hyperlipidemia    Hypertension    Pulmonary embolism (HCC)    Sleep apnea    Stroke (HCC)    Thyroid  disease    Past Surgical History:  Procedure Laterality Date   ABDOMINAL HYSTERECTOMY     BLADDER SUSPENSION     COLONOSCOPY WITH PROPOFOL  N/A 05/05/2020   Procedure: COLONOSCOPY WITH PROPOFOL ;  Surgeon: Rollin Dover, MD;  Location: [facility];  Service: Endoscopy;  Laterality: N/A;   EUS N/A 08/12/2013   Procedure: LOWER ENDOSCOPIC ULTRASOUND (EUS);  Surgeon: Toribio SHAUNNA Cedar, MD;  Location: [facility];  Service: Endoscopy;  Laterality: N/A;   KNEE SURGERY     POLYPECTOMY  05/05/2020   Procedure: POLYPECTOMY;  Surgeon: Rollin Dover, MD;  Location: [facility];  Service: Endoscopy;;   TUBAL LIGATION     Patient Active Problem List   Diagnosis Date Noted   Acute pulmonary embolism (HCC) 08/16/2023   Acute kidney injury 08/16/2023   Posterior neck pain 05/29/2021   Chronic idiopathic constipation 06/21/2020   Diverticular disease of colon 06/21/2020   Change in bowel habit 03/15/2020   Epigastric pain 03/15/2020   Gastroesophageal reflux disease 03/15/2020   Other specified bacterial intestinal infections 03/15/2020   Pulmonary hypertension (HCC) 06/29/2018   Educated  about COVID-19 virus infection 06/29/2018   OSA (obstructive sleep apnea) 02/03/2017   Hypothyroidism 02/02/2017   Chest pain 02/02/2017   Obesity, Class III, BMI 40-49.9 (morbid obesity) (HCC) 01/27/2017   Plantar fasciitis of left foot 11/08/2015   Non-insulin  treated type 2 diabetes mellitus (HCC) 06/08/2015   Colon cancer screening 06/08/2015   Bilateral leg edema 12/15/2014   Blurry vision, bilateral 06/16/2014   Breast cancer screening 06/16/2014   Other specified hypothyroidism 06/16/2014   Other seasonal allergic rhinitis 06/16/2014   History of stroke 01/31/2014   Rectal nodule 09/06/2013   Essential hypertension 08/24/2013   Nonspecific (abnormal) findings on radiological and other examination of gastrointestinal tract 08/12/2013   Lateral epicondylitis of right elbow 07/21/2013   Right shoulder pain 07/21/2013   Sprain of wrist, right 07/21/2013   Constipation 04/15/2013   Rectal bleeding 04/15/2013    PCP: Rik Glinda DASEN, FNP  REFERRING PROVIDER: Rik Glinda DASEN, FNP  REFERRING DIAG: [phone] (ICD-10-CM) - Pain in left knee  THERAPY DIAG:  Chronic pain of right knee  Chronic pain of left knee  Stiffness of right knee, not elsewhere classified  Difficulty in walking, not elsewhere classified  Muscle weakness (generalized)  Stiffness of left knee, not elsewhere classified  Rationale for Evaluation and Treatment: Rehabilitation  ONSET DATE:  Years off and on   SUBJECTIVE:   SUBJECTIVE  STATEMENT:  01/05/24: not so much pain today.  Better.  I do need to get more shoes, my shoes are worn out  I've had this issue for years off and on. Both knees hurt when I put pressure on it. I think I did too much one day and that's why I ended up seeing the doctor. Have had therapy for knees in the past but one time a baker's cyst developed and I ended up getting worse.    PERTINENT HISTORY:  See above  PAIN:  Are you having pain? Back, knees, and ankle 4-5/10.   PRECAUTIONS: Other: active blood thinners due to recent PE   RED FLAGS: None   WEIGHT BEARING RESTRICTIONS: No  FALLS:  Has patient fallen in last 6 months? Yes. 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  DIAGNOSTIC FINDINGS:   Narrative & Impression CLINICAL DATA:  Chest pain   EXAM: PORTABLE CHEST 1 VIEW   COMPARISON:  Chest x-ray 08/16/2023   FINDINGS: The heart is mildly enlarged. The lungs are clear. There is no pleural effusion or pneumothorax. No acute fractures are seen.   IMPRESSION: Mild cardiomegaly. No acute pulmonary process.  IMPRESSION: 1. No pulmonary embolism. No acute intrathoracic pathology identified. 2. Morphologic changes in keeping with pulmonary arterial hypertension. 3. Mild cardiomegaly. 4. Mild hepatic steatosis.   PATIENT SURVEYS:   LEFS 46/80   COGNITION: Overall cognitive status: Within functional limits for tasks assessed     FLEXIBILITY  Tight hip ER groups, consistent toe out with gait and static positions Tight HS and hip flexors per observation     LOWER EXTREMITY ROM:  Active ROM Right eval Left eval Right AROM 11/14/23 Left AROM 11/14/23 RT/left 11/27/23 RT/Left 12/11/23 RT/Left 12/31/23  Hip flexion         Hip extension         Hip abduction         Hip adduction         Hip internal rotation         Hip external rotation         Knee flexion 103* 109* 102 100  110/115 108/98 100/106  Knee extension 9* 12* 3 0 0/0    Ankle dorsiflexion         Ankle plantarflexion         Ankle inversion         Ankle eversion          (Blank rows = not tested)  LOWER EXTREMITY MMT:  MMT Right eval Left eval Right 11/14/23 Left 11/14/23 RT/Left RT /left 12/11/23 RT/Left 12/31/23  Hip flexion 4+ 4+ 5 5     Hip extension         Hip abduction 3 3+ 5 5     Hip adduction         Hip internal rotation         Hip external rotation  Knee flexion 4 4 4+ 4 4+/4+ 4+ BIL with pain 4+ bilat   Knee extension 4 4 4+ 4+ 4+/4+ 4+ BIL with pain 5/4+  Ankle dorsiflexion         Ankle plantarflexion         Ankle inversion         Ankle eversion          (Blank rows = not tested)                                                                                                                                  TREATMENT DATE:  01/05/24: Nustep L 5 x 6 min x 4 exts Standing B shoulder extension with cable, 5# 20X Hamstring curls 35#  10 x  Knee ext 10# 2 x10 Seated rows 20# 2 x 10  Lateral step ups on 4 step with hip abd 10 x each leg On slant board for B calf stretches  Sit to stand hi lo mat with red mediball 10 x \ Seated marches with green t band 10x Seated hip abd ER with green t band 10x   01/02/24 BERG 51/56 Black bar heel raises 2 sets 10and toe raises 8 x and stopped with pain Slant board stretch 3 x 20 sec Leg press 30# 2 sets 10. Calf raises 30# 2 sets 10 Cable pulley shld ext 5# 2 sets 10 Cable row 10# 2 sets 10 3# LAQ with ADD ball squeeze 2 sets 10 Green tband HS curl 2 sets 10 Green tband clams and hip flexion 2 sets 10   12/31/23 Sit to stands 2x10 HS curl 35# 2 sets 12 Knee ext 15# 2 sets 12 NuStep L5 x 6 min Resisted forward and backward walking 30lb Shoulder Ext 5lb 2x10 Hip add ball squeeze  12/18/23 Nustep L 5 - verb 8/10 pain pain she arrived with was gone after this MMT grossly tested BIL LE hip and knee WFL  without pain AROM BIL knee- see below BERG 50/56 HS curl 35# 2 sets 10 Knee ext 15# 2 sets 10 Back ext black band 2 sets 10 Working on stairs , step over step  4 in and  6 in 2 rail , then 1 rail 6 inch step up BIL UE support step up with opp leg ext then laterally with opp leg abd STS with wt ball chest press 10 x   12/11/23 Nustep L 5 6 min MMT/ROM and goals assessed and documented Seated dyna disc 4 way 10 x each for lumbar ROM 3# on dyna disc 2 sets 10 LAQ,hip flex and hip abd Green tband HS curl 2 sets 10 STS with wt ball press 10 x 30 # resisted gait 4 ways 4 x each   11/27/23 Nustep L 5 LE only STS with wt ball press 10x 2 sets Farmers carry 8# 2 laps each hand with seated rest needed btwn 6  inch step up with opp leg ext 10 x- with UE support 6 inch lateral step up with opp leg abd 10x with UE support- hip weakness noted with lateral step up vs fwd L> RT Foam beam in // bars side stepping 5 x each way with light HHA, then tandem fwd and backward 5 x Knee ext 15 # 2 sets 10 HS curl 30# 2 sets 10   11/25/23 Resisted gait 40# 4 ways 5 x each Black bar heel raises 2 sets 10 Knee ext 10 # 2 sets 10 HS curl 25# 2 sets 10 6 inch step up 10 x BIL the side step up 10 x BIL- with UE support Seated blue tband hip flex and abd 2 sets 10 STS 10 x with wt ball 10 x 2 sets Nustep L 5  LE only    11/21/23 AROM Left knee  0-105 AROM RT  knee 0-102 Goals assessed LAQ 3# 2 sets 10 Standing with UE support 3# 12 x HS curl, hip ext,hip abd and hip flexion 3# marching fwd and walking backward 10 feet 5 x each 3# side stepping 5 x 10 feet Leg Press 40# 2 sets 10. Calf raises 2 sets 10 STS with wt ball 2 sets 10 Resisted gait 30# 4 ways 5 x each      11/14/23 NuStep L5 x 6 min PROM to bilateral knees  Patellar mobs (difficulty relaxing) Sit to stands 2x10 6 in step ups 2x5 30lb resisted side steps x5 each   11/06/23 Nustep L 6 5 min Resisted gait fwd  and back 4 x each then laterally 3 x 20# Black bar heel raises  15 x ( attempted toe raise but unable) Red tband DF 2 sets 10 BIL Red tband HS curls 2 sets 10 3# LAQ 2 sets 10 Standing 3# SL hip flex,ext and abd 10 x each STS 10 x from mat     10/30/23  Eval, POC, HEP and education as below    Nustep L4x6 minutes all four extremities, seat 10  HEP practice and discussion    PATIENT EDUCATION:  Education details: exam findings, POC, HEP  Person educated: Patient Education method: Programmer, Multimedia, Demonstration, and Handouts Education comprehension: verbalized understanding, returned demonstration, and needs further education  HOME EXERCISE PROGRAM:  Access Code: [email or web address] URL: [email or web address] Date: 10/30/2023 Prepared by: Josette Rough  Exercises - Supine Figure 4 Piriformis Stretch  - 1 x daily - 7 x weekly - 2 sets - 4-6 reps - 30 seconds  hold - Supine Bridge with Resistance Band  - 1 x daily - 7 x weekly - 2 sets - 10 reps - 2 seconds  hold - Clamshell with Resistance  - 1 x daily - 7 x weekly - 2 sets - 10 reps - 1 second  hold - Seated Knee Extension with Anchored Resistance  - 1 x daily - 7 x weekly - 2 sets - 10 reps - 2 seconds  hold  ASSESSMENT:  CLINICAL IMPRESSION:  Pt with improved pain today.  No c/o as compared to last session.  Progressed with alternating challenges for B LE stabilization as well as postural training for Ue's She tolerated very well .  To complete her PT after 4 more visits.  Probably will need review and revision of HEP in the next several sessions.  Patient is a 68 y.o. F who was seen today for physical therapy evaluation and treatment for M25.562 (ICD-10-CM) - Pain in left knee. Objectives  as above. Has recent history of PE due to sedentary period, also has history of Baker's cyst and limited success in PT in the past due to this. Will make every effort to improve pain and function moving forward.  OBJECTIVE IMPAIRMENTS:  Abnormal gait, decreased activity tolerance, decreased mobility, difficulty walking, decreased ROM, decreased strength, impaired flexibility, obesity, and pain.   ACTIVITY LIMITATIONS: standing, squatting, stairs, transfers, and locomotion level  PARTICIPATION LIMITATIONS: meal prep, cleaning, laundry, driving, shopping, community activity, and yard work  PERSONAL FACTORS: Age, Behavior pattern, Education, Fitness, Past/current experiences, Social background, and Time since onset of injury/illness/exacerbation are also affecting patient's functional outcome.   REHAB POTENTIAL: Fair body habitus, chronicity of pain, limited success with PT in the past   CLINICAL DECISION MAKING: Stable/uncomplicated  EVALUATION COMPLEXITY: Low   GOALS: Goals reviewed with patient? No  SHORT TERM GOALS: Target date: 11/27/2023   Will be compliant with appropriate progressive HEP Goal status: 11/06/23 MET for initial HEP   2. Knee AROM flexion to be at least 115* B and extension AROM to be no more than 5* B  Goal status: Ongoing 11/14/23  and 11/21/23  11/27/23 met for ext and progressing for flexion . 12/18/23  Rt knee AROM   0-108               Left knee AROM 2-110   3. Will be independent with BLE edema management strategies  Goal status: progressing 11/21/23  10/16 /25 MET as she can verb tech but edema still an issue   4. Gait pattern to have normalized to include consistent step through pattern with good heel-toe mechanics, no toe out with gait Goal status: Progressing 11/14/23     11/27/23 progressing 12/11/23 progressing  12/18/23 progressing  LONG TERM GOALS: Target date: 01/30/24    MMT to have improved by one grade all weak groups Goal status: Progressing 11/14/23  and 11/27/23 12/11/23 slight set back after fall , increased pain 12/18/23 MET   2. Will score at least 45 on Berg to show reduced fall risk  Goal status: 12/18/23 MET 01/02/24 51/56   3. Will be able to ascend and descend  stairs reciprocally with no increase in pain Goal status: 12/18/23 progressing- still limited, Ongoing 12/31/23   4. Will be able to ambulate community distances and perform all household tasks with no increase in pain  Goal status: 12/11/23 progressing, ongoing 12/31/23   5. LEFS to have improved by at least 10 points to show improved QOL and subjective improvement     PLAN:  PT FREQUENCY: 2x/week  PT DURATION: 8 weeks  PLANNED INTERVENTIONS: 97750- Physical Performance Testing, 97110-Therapeutic exercises, 97530- Therapeutic activity, [id number]- Neuromuscular re-education, 97535- Self Care, [id number]- Manual therapy, [id number]- Gait training, [phone]- Aquatic Therapy, [phone]- Vasopneumatic device, and Patient/Family education  PLAN FOR NEXT SESSION: progress strength and func gait .  Traniece Boffa, PT, DPT, OCS 01/05/24 8:52 AM

## 2024-01-13 ENCOUNTER — Ambulatory Visit: Admitting: Physical Therapy

## 2024-01-13 DIAGNOSIS — M25662 Stiffness of left knee, not elsewhere classified: Secondary | ICD-10-CM | POA: Diagnosis present

## 2024-01-13 DIAGNOSIS — M25661 Stiffness of right knee, not elsewhere classified: Secondary | ICD-10-CM | POA: Insufficient documentation

## 2024-01-13 DIAGNOSIS — M25561 Pain in right knee: Secondary | ICD-10-CM | POA: Diagnosis present

## 2024-01-13 DIAGNOSIS — M25562 Pain in left knee: Secondary | ICD-10-CM | POA: Diagnosis present

## 2024-01-13 DIAGNOSIS — R262 Difficulty in walking, not elsewhere classified: Secondary | ICD-10-CM | POA: Diagnosis present

## 2024-01-13 DIAGNOSIS — M6281 Muscle weakness (generalized): Secondary | ICD-10-CM | POA: Diagnosis present

## 2024-01-13 DIAGNOSIS — G8929 Other chronic pain: Secondary | ICD-10-CM | POA: Insufficient documentation

## 2024-01-13 NOTE — Therapy (Signed)
 OUTPATIENT PHYSICAL THERAPY LOWER EXTREMITY   Patient Name: Tiffany Strickland MRN: [id number] DOB:07-Jun-1955, 68 y.o., female Today's Date: 01/13/2024  END OF SESSION:  PT End of Session - 01/13/24 0745     Visit Number 12    Date for Recertification  01/30/24    Authorization Type [employer] Dual    PT Start Time 0745    PT Stop Time 0830    PT Time Calculation (min) 45 min           Past Medical History:  Diagnosis Date   Colon polyp    adenomatous   Diabetes mellitus without complication (HCC)    Hyperlipidemia    Hypertension    Pulmonary embolism (HCC)    Sleep apnea    Stroke (HCC)    Thyroid  disease    Past Surgical History:  Procedure Laterality Date   ABDOMINAL HYSTERECTOMY     BLADDER SUSPENSION     COLONOSCOPY WITH PROPOFOL  N/A 05/05/2020   Procedure: COLONOSCOPY WITH PROPOFOL ;  Surgeon: Rollin Dover, MD;  Location: [facility];  Service: Endoscopy;  Laterality: N/A;   EUS N/A 08/12/2013   Procedure: LOWER ENDOSCOPIC ULTRASOUND (EUS);  Surgeon: Toribio SHAUNNA Cedar, MD;  Location: [facility];  Service: Endoscopy;  Laterality: N/A;   KNEE SURGERY     POLYPECTOMY  05/05/2020   Procedure: POLYPECTOMY;  Surgeon: Rollin Dover, MD;  Location: [facility];  Service: Endoscopy;;   TUBAL LIGATION     Patient Active Problem List   Diagnosis Date Noted   Acute pulmonary embolism (HCC) 08/16/2023   Acute kidney injury 08/16/2023   Posterior neck pain 05/29/2021   Chronic idiopathic constipation 06/21/2020   Diverticular disease of colon 06/21/2020   Change in bowel habit 03/15/2020   Epigastric pain 03/15/2020   Gastroesophageal reflux disease 03/15/2020   Other specified bacterial intestinal infections 03/15/2020   Pulmonary hypertension (HCC) 06/29/2018   Educated about COVID-19 virus infection 06/29/2018   OSA (obstructive sleep apnea) 02/03/2017   Hypothyroidism 02/02/2017   Chest pain 02/02/2017   Obesity, Class III, BMI 40-49.9 (morbid obesity) (HCC)  01/27/2017   Plantar fasciitis of left foot 11/08/2015   Non-insulin  treated type 2 diabetes mellitus (HCC) 06/08/2015   Colon cancer screening 06/08/2015   Bilateral leg edema 12/15/2014   Blurry vision, bilateral 06/16/2014   Breast cancer screening 06/16/2014   Other specified hypothyroidism 06/16/2014   Other seasonal allergic rhinitis 06/16/2014   History of stroke 01/31/2014   Rectal nodule 09/06/2013   Essential hypertension 08/24/2013   Nonspecific (abnormal) findings on radiological and other examination of gastrointestinal tract 08/12/2013   Lateral epicondylitis of right elbow 07/21/2013   Right shoulder pain 07/21/2013   Sprain of wrist, right 07/21/2013   Constipation 04/15/2013   Rectal bleeding 04/15/2013    PCP: Rik Glinda DASEN, FNP  REFERRING PROVIDER: Rik Glinda DASEN, FNP  REFERRING DIAG: [phone] (ICD-10-CM) - Pain in left knee  THERAPY DIAG:  Chronic pain of right knee  Chronic pain of left knee  Difficulty in walking, not elsewhere classified  Muscle weakness (generalized)  Stiffness of left knee, not elsewhere classified  Rationale for Evaluation and Treatment: Rehabilitation  ONSET DATE:  Years off and on   SUBJECTIVE:   SUBJECTIVE STATEMENT:  feeling pretty good, minimal to no pain currently. Amb in slowly. BIL ER and trendelenburg gait PERTINENT HISTORY:  See above  PAIN:  Are you having pain? Back, knees, and ankle 1-2/10.  PRECAUTIONS: Other: active blood thinners due to recent PE  RED FLAGS: None   WEIGHT BEARING RESTRICTIONS: No  FALLS:  Has patient fallen in last 6 months? Yes. 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  DIAGNOSTIC FINDINGS:   Narrative & Impression CLINICAL DATA:  Chest pain   EXAM: PORTABLE CHEST 1 VIEW   COMPARISON:  Chest x-ray 08/16/2023   FINDINGS: The heart is mildly enlarged. The lungs are clear. There is no pleural effusion or pneumothorax. No acute fractures are seen.   IMPRESSION: Mild cardiomegaly. No acute pulmonary process.  IMPRESSION: 1. No pulmonary embolism. No acute intrathoracic pathology identified. 2. Morphologic changes in keeping with pulmonary arterial hypertension. 3. Mild cardiomegaly. 4. Mild hepatic steatosis.   PATIENT SURVEYS:   LEFS 46/80   COGNITION: Overall cognitive status: Within functional limits for tasks assessed     FLEXIBILITY  Tight hip ER groups, consistent toe out with gait and static positions Tight HS and hip flexors per observation     LOWER EXTREMITY ROM:  Active ROM Right eval Left eval Right AROM 11/14/23 Left AROM 11/14/23 RT/left 11/27/23 RT/Left 12/11/23 RT/Left 12/31/23  Hip flexion         Hip extension         Hip abduction         Hip adduction         Hip internal rotation         Hip external rotation         Knee flexion 103* 109* 102 100 110/115 108/98 100/106  Knee extension 9* 12* 3 0 0/0    Ankle dorsiflexion         Ankle plantarflexion         Ankle inversion         Ankle eversion          (Blank rows = not tested)  LOWER EXTREMITY MMT:  MMT Right eval Left eval Right 11/14/23 Left 11/14/23 RT/Left RT /left 12/11/23 RT/Left 12/31/23  Hip flexion 4+ 4+ 5 5     Hip extension         Hip abduction 3 3+ 5 5     Hip adduction         Hip internal rotation         Hip external rotation         Knee flexion 4 4 4+ 4 4+/4+ 4+ BIL with pain 4+  bilat   Knee extension 4 4 4+ 4+ 4+/4+ 4+ BIL with pain 5/4+  Ankle dorsiflexion         Ankle plantarflexion         Ankle inversion         Ankle eversion          (Blank rows = not tested)  TREATMENT DATE:   01/13/24 Nustep L 5 Standing B shoulder extension with cable, 5# 20X Hamstring curls 35#  10 x  Knee ext 10# 2 x10 Seated rows 20# 2 x 10  Black tband trunk ext 20 x Lateral step ups on 6 step with hip abd 10 x each leg On slant board for B calf stretches  Sit to stand hi lo mat with yellow mediball 10 x 2 sets  Seated marches with green t band 10x 2 sets Seated green tband clams 2 sets 10 Seated hip abd ER and IR  with green t band 10x 2 sets   01/05/24: Nustep L 5 x 6 min x 4 exts Standing B shoulder extension with cable, 5# 20X Hamstring curls 35#  10 x  Knee ext 10# 2 x10 Seated rows 20# 2 x 10  Lateral step ups on 4 step with hip abd 10 x each leg On slant board for B calf stretches  Sit to stand hi lo mat with red mediball 10 x \ Seated marches with green t band 10x Seated hip abd ER with green t band 10x   01/02/24 BERG 51/56 Black bar heel raises 2 sets 10and toe raises 8 x and stopped with pain Slant board stretch 3 x 20 sec Leg press 30# 2 sets 10. Calf raises 30# 2 sets 10 Cable pulley shld ext 5# 2 sets 10 Cable row 10# 2 sets 10 3# LAQ with ADD ball squeeze 2 sets 10 Green tband HS curl 2 sets 10 Green tband clams and hip flexion 2 sets 10   12/31/23 Sit to stands 2x10 HS curl 35# 2 sets 12 Knee ext 15# 2 sets 12 NuStep L5 x 6 min Resisted forward and backward walking 30lb Shoulder Ext 5lb 2x10 Hip add ball squeeze  12/18/23 Nustep L 5 - verb 8/10 pain pain she arrived with was gone after this MMT grossly tested BIL LE hip and knee WFL without pain AROM BIL knee- see below BERG 50/56 HS curl 35#  2 sets 10 Knee ext 15# 2 sets 10 Back ext black band 2 sets 10 Working on stairs , step over step  4 in and  6 in 2 rail , then 1 rail 6 inch step up BIL UE support step up with opp leg ext then laterally with opp leg abd STS with wt ball chest press 10 x   12/11/23 Nustep L 5 6 min MMT/ROM and goals assessed and documented Seated dyna disc 4 way 10 x each for lumbar ROM 3# on dyna disc 2 sets 10 LAQ,hip flex and hip abd Green tband HS curl 2 sets 10 STS with wt ball press 10 x 30 # resisted gait 4 ways 4 x each   11/27/23 Nustep L 5 LE only STS with wt ball press 10x 2 sets Farmers carry 8# 2 laps each hand with seated rest needed btwn 6 inch step up with opp leg ext 10 x- with UE support 6 inch lateral step up with opp leg abd 10x with UE support- hip weakness noted with lateral step up vs fwd L> RT Foam beam in // bars side stepping 5 x each way with light HHA, then tandem fwd and backward 5 x Knee ext 15 # 2 sets 10 HS curl 30# 2 sets 10   11/25/23 Resisted gait 40# 4 ways 5 x each Black bar heel raises 2 sets 10 Knee ext 10 # 2 sets 10 HS curl  25# 2 sets 10 6 inch step up 10 x BIL the side step up 10 x BIL- with UE support Seated blue tband hip flex and abd 2 sets 10 STS 10 x with wt ball 10 x 2 sets Nustep L 5  LE only    11/21/23 AROM Left knee  0-105 AROM RT  knee 0-102 Goals assessed LAQ 3# 2 sets 10 Standing with UE support 3# 12 x HS curl, hip ext,hip abd and hip flexion 3# marching fwd and walking backward 10 feet 5 x each 3# side stepping 5 x 10 feet Leg Press 40# 2 sets 10. Calf raises 2 sets 10 STS with wt ball 2 sets 10 Resisted gait 30# 4 ways 5 x each      11/14/23 NuStep L5 x 6 min PROM to bilateral knees  Patellar mobs (difficulty relaxing) Sit to stands 2x10 6 in step ups 2x5 30lb resisted side steps x5 each   11/06/23 Nustep L 6 5 min Resisted gait fwd and back 4 x each then laterally 3 x 20# Black bar heel raises   15 x ( attempted toe raise but unable) Red tband DF 2 sets 10 BIL Red tband HS curls 2 sets 10 3# LAQ 2 sets 10 Standing 3# SL hip flex,ext and abd 10 x each STS 10 x from mat     10/30/23  Eval, POC, HEP and education as below    Nustep L4x6 minutes all four extremities, seat 10  HEP practice and discussion    PATIENT EDUCATION:  Education details: exam findings, POC, HEP  Person educated: Patient Education method: Programmer, Multimedia, Demonstration, and Handouts Education comprehension: verbalized understanding, returned demonstration, and needs further education  HOME EXERCISE PROGRAM:  Access Code: [email or web address] URL: [email or web address] Date: 10/30/2023 Prepared by: Josette Rough  Exercises - Supine Figure 4 Piriformis Stretch  - 1 x daily - 7 x weekly - 2 sets - 4-6 reps - 30 seconds  hold - Supine Bridge with Resistance Band  - 1 x daily - 7 x weekly - 2 sets - 10 reps - 2 seconds  hold - Clamshell with Resistance  - 1 x daily - 7 x weekly - 2 sets - 10 reps - 1 second  hold - Seated Knee Extension with Anchored Resistance  - 1 x daily - 7 x weekly - 2 sets - 10 reps - 2 seconds  hold  ASSESSMENT: feeling pretty good, minimal to no pain currently. Amb in slowly. BIL ER , short steps and trendelenburg gait. Progressed ex with cuing for core engagement and controlled ROM. Goals assessed and progressing. CLINICAL IMPRESSION:   Patient is a 68 y.o. F who was seen today for physical therapy evaluation and treatment for M25.562 (ICD-10-CM) - Pain in left knee. Objectives as above. Has recent history of PE due to sedentary period, also has history of Baker's cyst and limited success in PT in the past due to this. Will make every effort to improve pain and function moving forward.  OBJECTIVE IMPAIRMENTS: Abnormal gait, decreased activity tolerance, decreased mobility, difficulty walking, decreased ROM, decreased strength, impaired flexibility, obesity, and pain.    ACTIVITY LIMITATIONS: standing, squatting, stairs, transfers, and locomotion level  PARTICIPATION LIMITATIONS: meal prep, cleaning, laundry, driving, shopping, community activity, and yard work  PERSONAL FACTORS: Age, Behavior pattern, Education, Fitness, Past/current experiences, Social background, and Time since onset of injury/illness/exacerbation are also affecting patient's functional outcome.   REHAB POTENTIAL: Fair body habitus, chronicity of pain, limited success  with PT in the past   CLINICAL DECISION MAKING: Stable/uncomplicated  EVALUATION COMPLEXITY: Low   GOALS: Goals reviewed with patient? No  SHORT TERM GOALS: Target date: 11/27/2023   Will be compliant with appropriate progressive HEP Goal status: 11/06/23 MET for initial HEP   2. Knee AROM flexion to be at least 115* B and extension AROM to be no more than 5* B  Goal status: Ongoing 11/14/23  and 11/21/23  11/27/23 met for ext and progressing for flexion . 12/18/23  Rt knee AROM   0-108               Left knee AROM 2-110   3. Will be independent with BLE edema management strategies  Goal status: progressing 11/21/23  10/16 /25 MET as she can verb tech but edema still an issue   4. Gait pattern to have normalized to include consistent step through pattern with good heel-toe mechanics, no toe out with gait Goal status: Progressing 11/14/23     11/27/23 progressing 12/11/23 progressing  12/18/23 progressing  01/13/24 progressing  LONG TERM GOALS: Target date: 01/30/24    MMT to have improved by one grade all weak groups Goal status: Progressing 11/14/23  and 11/27/23 12/11/23 slight set back after fall , increased pain 12/18/23 MET   2. Will score at least 45 on Berg to show reduced fall risk  Goal status: 12/18/23 MET 01/02/24 51/56   3. Will be able to ascend and descend stairs reciprocally with no increase in pain Goal status: 12/18/23 progressing- still limited, Ongoing 12/31/23 and 01/13/24   4. Will be  able to ambulate community distances and perform all household tasks with no increase in pain  Goal status: 12/11/23 progressing, ongoing 12/31/23  and 01/13/24   5. LEFS to have improved by at least 10 points to show improved QOL and subjective improvement     PLAN:  PT FREQUENCY: 2x/week  PT DURATION: 8 weeks  PLANNED INTERVENTIONS: 97750- Physical Performance Testing, 97110-Therapeutic exercises, 97530- Therapeutic activity, [id number]- Neuromuscular re-education, 97535- Self Care, [id number]- Manual therapy, [id number]- Gait training, [phone]- Aquatic Therapy, [phone]- Vasopneumatic device, and Patient/Family education  PLAN FOR NEXT SESSION: progress strength and func gait . Check LEFS  Jon Or , PTA 01/13/24 7:46 AM

## 2024-01-14 ENCOUNTER — Encounter: Payer: Self-pay | Admitting: Podiatry

## 2024-01-14 ENCOUNTER — Ambulatory Visit: Admitting: Podiatry

## 2024-01-14 ENCOUNTER — Ambulatory Visit

## 2024-01-14 VITALS — Ht 65.0 in | Wt 275.0 lb

## 2024-01-14 DIAGNOSIS — M722 Plantar fascial fibromatosis: Secondary | ICD-10-CM

## 2024-01-14 DIAGNOSIS — E1142 Type 2 diabetes mellitus with diabetic polyneuropathy: Secondary | ICD-10-CM

## 2024-01-14 NOTE — Progress Notes (Signed)
   Chief Complaint  Patient presents with   Foot Pain    Pt is here due to bilateral heel pain, states this has been going on for a month, no injuries,states she bought some insoles that helps with the pain and using OTC meds for some relief. No other complaints.    Subjective: 68 y.o. female for evaluation of bilateral heel pain ongoing for few months.  Idiopathic onset.  No history of injury.  She did purchase some insoles which have helped significantly.  She also takes Exer strength Tylenol  PRN   Past Medical History:  Diagnosis Date   Colon polyp    adenomatous   Diabetes mellitus without complication (HCC)    Hyperlipidemia    Hypertension    Pulmonary embolism (HCC)    Sleep apnea    Stroke (HCC)    Thyroid  disease      Objective: Physical Exam General: The patient is alert and oriented x3 in no acute distress.  Dermatology: Skin is warm, dry and supple bilateral lower extremities. Negative for open lesions or macerations bilateral.   Vascular: Dorsalis Pedis and Posterior Tibial pulses palpable bilateral.  Capillary fill time is immediate to all digits.  Neurological: Light touch and protective threshold diminished bilateral  Musculoskeletal: There is some tenderness to palpation to the plantar aspect of the bilateral heels along the plantar fascia. All other joints range of motion within normal limits bilateral. Strength 5/5 in all groups bilateral.   Radiographic exam B/L feet 01/14/2024: Moderate degenerative changes noted throughout the midtarsal joints bilateral feet consistent with DJD.  Pes planovalgus deformity also noted.  Small plantar heel spur noted on lateral view  Assessment: 1. plantar fasciitis bilateral feet  Plan of Care:  -Patient evaluated. Xrays reviewed.   -Declined cortisone injections today -Continue OTC Exer strength Tylenol  as needed -Patient admits to walking around the house the majority of the day barefoot.  Advised against this.   Recommend her diabetic shoes and Plastizote insoles -Return to clinic next scheduled appointment for routine footcare with Dr. Gaynel Thresa EMERSON Janit, DPM [facility]  Dr. Thresa EMERSON Janit, DPM    [address] [place], [place] [address]                Office [phone]  Fax [phone]

## 2024-01-15 ENCOUNTER — Ambulatory Visit: Admitting: Physical Therapy

## 2024-01-15 DIAGNOSIS — R262 Difficulty in walking, not elsewhere classified: Secondary | ICD-10-CM

## 2024-01-15 DIAGNOSIS — M25561 Pain in right knee: Secondary | ICD-10-CM | POA: Diagnosis not present

## 2024-01-15 DIAGNOSIS — M6281 Muscle weakness (generalized): Secondary | ICD-10-CM

## 2024-01-15 DIAGNOSIS — G8929 Other chronic pain: Secondary | ICD-10-CM

## 2024-01-15 NOTE — Therapy (Signed)
 OUTPATIENT PHYSICAL THERAPY LOWER EXTREMITY   Patient Name: Tiffany Strickland MRN: [id number] DOB:March 12, 1955, 68 y.o., female Today's Date: 01/15/2024  END OF SESSION:  PT End of Session - 01/15/24 0753     Visit Number 13    Number of Visits 17    Date for Recertification  01/30/24    Authorization Type [employer] Dual    PT Start Time 0755    PT Stop Time 0840    PT Time Calculation (min) 45 min           Past Medical History:  Diagnosis Date   Colon polyp    adenomatous   Diabetes mellitus without complication (HCC)    Hyperlipidemia    Hypertension    Pulmonary embolism (HCC)    Sleep apnea    Stroke (HCC)    Thyroid  disease    Past Surgical History:  Procedure Laterality Date   ABDOMINAL HYSTERECTOMY     BLADDER SUSPENSION     COLONOSCOPY WITH PROPOFOL  N/A 05/05/2020   Procedure: COLONOSCOPY WITH PROPOFOL ;  Surgeon: Rollin Dover, MD;  Location: [facility];  Service: Endoscopy;  Laterality: N/A;   EUS N/A 08/12/2013   Procedure: LOWER ENDOSCOPIC ULTRASOUND (EUS);  Surgeon: Toribio SHAUNNA Cedar, MD;  Location: [facility];  Service: Endoscopy;  Laterality: N/A;   KNEE SURGERY     POLYPECTOMY  05/05/2020   Procedure: POLYPECTOMY;  Surgeon: Rollin Dover, MD;  Location: [facility];  Service: Endoscopy;;   TUBAL LIGATION     Patient Active Problem List   Diagnosis Date Noted   Acute pulmonary embolism (HCC) 08/16/2023   Acute kidney injury 08/16/2023   Posterior neck pain 05/29/2021   Chronic idiopathic constipation 06/21/2020   Diverticular disease of colon 06/21/2020   Change in bowel habit 03/15/2020   Epigastric pain 03/15/2020   Gastroesophageal reflux disease 03/15/2020   Other specified bacterial intestinal infections 03/15/2020   Pulmonary hypertension (HCC) 06/29/2018   Educated about COVID-19 virus infection 06/29/2018   OSA (obstructive sleep apnea) 02/03/2017   Hypothyroidism 02/02/2017   Chest pain 02/02/2017   Obesity, Class III, BMI 40-49.9  (morbid obesity) (HCC) 01/27/2017   Plantar fasciitis of left foot 11/08/2015   Non-insulin  treated type 2 diabetes mellitus (HCC) 06/08/2015   Colon cancer screening 06/08/2015   Bilateral leg edema 12/15/2014   Blurry vision, bilateral 06/16/2014   Breast cancer screening 06/16/2014   Other specified hypothyroidism 06/16/2014   Other seasonal allergic rhinitis 06/16/2014   History of stroke 01/31/2014   Rectal nodule 09/06/2013   Essential hypertension 08/24/2013   Nonspecific (abnormal) findings on radiological and other examination of gastrointestinal tract 08/12/2013   Lateral epicondylitis of right elbow 07/21/2013   Right shoulder pain 07/21/2013   Sprain of wrist, right 07/21/2013   Constipation 04/15/2013   Rectal bleeding 04/15/2013    PCP: Rik Glinda DASEN, FNP  REFERRING PROVIDER: Rik Glinda DASEN, FNP  REFERRING DIAG: [phone] (ICD-10-CM) - Pain in left knee  THERAPY DIAG:  Chronic pain of right knee  Chronic pain of left knee  Difficulty in walking, not elsewhere classified  Muscle weakness (generalized)  Rationale for Evaluation and Treatment: Rehabilitation  ONSET DATE:  Years off and on   SUBJECTIVE:   SUBJECTIVE STATEMENT:   feel better than I have Rt leg is still an issue , Posterior,lateral esp with flexion so getting in and out of cars is hard   PERTINENT HISTORY:  See above  PAIN: RT leg 7/10    PRECAUTIONS: Other: active  blood thinners due to recent PE   RED FLAGS: None   WEIGHT BEARING RESTRICTIONS: No  FALLS:  Has patient fallen in last 6 months? Yes. 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  DIAGNOSTIC FINDINGS:   Narrative & Impression CLINICAL DATA:  Chest pain   EXAM: PORTABLE CHEST 1 VIEW   COMPARISON:  Chest x-ray 08/16/2023   FINDINGS: The heart is mildly enlarged. The lungs are clear. There is no pleural effusion or pneumothorax. No acute fractures are seen.   IMPRESSION: Mild cardiomegaly. No acute pulmonary process.  IMPRESSION: 1. No pulmonary embolism. No acute intrathoracic pathology identified. 2. Morphologic changes in keeping with pulmonary arterial hypertension. 3. Mild cardiomegaly. 4. Mild hepatic steatosis.   PATIENT SURVEYS:   LEFS 46/80   COGNITION: Overall cognitive status: Within functional limits for tasks assessed     FLEXIBILITY  Tight hip ER groups, consistent toe out with gait and static positions Tight HS and hip flexors per observation     LOWER EXTREMITY ROM:  Active ROM Right eval Left eval Right AROM 11/14/23 Left AROM 11/14/23 RT/left 11/27/23 RT/Left 12/11/23 RT/Left 12/31/23 RT/Left 01/15/24  Hip flexion          Hip extension          Hip abduction          Hip adduction          Hip internal rotation          Hip external rotation          Knee flexion 103* 109* 102 100 110/115 108/98 100/106 104/107  Knee extension 9* 12* 3 0 0/0     Ankle dorsiflexion          Ankle plantarflexion          Ankle inversion          Ankle eversion           (Blank rows = not tested)  LOWER EXTREMITY MMT:  MMT Right eval Left eval Right 11/14/23 Left 11/14/23 RT/Left RT /left 12/11/23 RT/Left 12/31/23  Hip flexion 4+ 4+ 5 5     Hip extension         Hip abduction 3 3+ 5 5     Hip adduction         Hip internal rotation         Hip external rotation         Knee flexion 4 4 4+ 4 4+/4+ 4+ BIL with pain 4+  bilat   Knee extension 4 4 4+ 4+ 4+/4+ 4+ BIL with pain 5/4+  Ankle dorsiflexion         Ankle plantarflexion         Ankle inversion         Ankle eversion          (Blank rows = not tested)  TREATMENT DATE:  01/15/24 Nustep L 5 STS wt ball 10x chest press, 10 x OH Hamstring curls 35#  3 sets 10 Knee ext 10# 3 x10 3# alt step tap on 6 inch box with 1 UE support CGA 20 x, then repeat with 8 inch step 3 # Standing HS curls 2 sets 10 BIL Lateral step ups on 6 step with hip abd 10 x each leg with 3# 3# seated hip flexion into abd onto 6 in step 2 sets 10 BIL  01/13/24 Nustep L 5 Standing B shoulder extension with cable, 5# 20X Hamstring curls 35#  10 x  Knee ext 10# 2 x10 Seated rows 20# 2 x 10  Black tband trunk ext 20 x Lateral step ups on 6 step with hip abd 10 x each leg On slant board for B calf stretches  Sit to stand hi lo mat with yellow mediball 10 x 2 sets  Seated marches with green t band 10x 2 sets Seated green tband clams 2 sets 10 Seated hip abd ER and IR  with green t band 10x 2 sets   01/05/24: Nustep L 5 x 6 min x 4 exts Standing B shoulder extension with cable, 5# 20X Hamstring curls 35#  10 x  Knee ext 10# 2 x10 Seated rows 20# 2 x 10  Lateral step ups on 4 step with hip abd 10 x each leg On slant board for B calf stretches  Sit to stand hi lo mat with red mediball 10 x \ Seated marches with green t band 10x Seated hip abd ER with green t band 10x   01/02/24 BERG 51/56 Black bar heel raises 2 sets 10and toe raises 8 x and stopped with pain Slant board stretch 3 x 20 sec Leg press 30# 2 sets 10. Calf raises 30# 2 sets 10 Cable pulley shld ext 5# 2 sets 10 Cable row 10# 2 sets 10 3# LAQ with ADD ball squeeze 2 sets 10 Green tband HS curl 2 sets 10 Green tband clams and hip flexion 2 sets  10   12/31/23 Sit to stands 2x10 HS curl 35# 2 sets 12 Knee ext 15# 2 sets 12 NuStep L5 x 6 min Resisted forward and backward walking 30lb Shoulder Ext 5lb 2x10 Hip add ball squeeze  12/18/23 Nustep L 5 - verb 8/10 pain pain she arrived with was gone after this MMT grossly tested BIL LE hip and knee WFL without pain AROM BIL knee- see below BERG 50/56 HS curl 35# 2 sets 10 Knee ext 15# 2 sets 10 Back ext black band 2 sets 10 Working on stairs , step over step  4 in and  6 in 2 rail , then 1 rail 6 inch step up BIL UE support step up with opp leg ext then laterally with opp leg abd STS with wt ball chest press 10 x   12/11/23 Nustep L 5 6 min MMT/ROM and goals assessed and documented Seated dyna disc 4 way 10 x each for lumbar ROM 3# on dyna disc 2 sets 10 LAQ,hip flex and hip abd Green tband HS curl 2 sets 10 STS with wt ball press 10 x 30 # resisted gait 4 ways 4 x each   11/27/23 Nustep L 5 LE only STS with wt ball press 10x 2 sets Farmers carry 8# 2 laps each hand with seated rest needed btwn 6 inch step up with opp leg ext 10 x- with UE support  6 inch lateral step up with opp leg abd 10x with UE support- hip weakness noted with lateral step up vs fwd L> RT Foam beam in // bars side stepping 5 x each way with light HHA, then tandem fwd and backward 5 x Knee ext 15 # 2 sets 10 HS curl 30# 2 sets 10   11/25/23 Resisted gait 40# 4 ways 5 x each Black bar heel raises 2 sets 10 Knee ext 10 # 2 sets 10 HS curl 25# 2 sets 10 6 inch step up 10 x BIL the side step up 10 x BIL- with UE support Seated blue tband hip flex and abd 2 sets 10 STS 10 x with wt ball 10 x 2 sets Nustep L 5  LE only    11/21/23 AROM Left knee  0-105 AROM RT  knee 0-102 Goals assessed LAQ 3# 2 sets 10 Standing with UE support 3# 12 x HS curl, hip ext,hip abd and hip flexion 3# marching fwd and walking backward 10 feet 5 x each 3# side stepping 5 x 10 feet Leg Press 40#  2 sets 10. Calf raises 2 sets 10 STS with wt ball 2 sets 10 Resisted gait 30# 4 ways 5 x each      11/14/23 NuStep L5 x 6 min PROM to bilateral knees  Patellar mobs (difficulty relaxing) Sit to stands 2x10 6 in step ups 2x5 30lb resisted side steps x5 each   11/06/23 Nustep L 6 5 min Resisted gait fwd and back 4 x each then laterally 3 x 20# Black bar heel raises  15 x ( attempted toe raise but unable) Red tband DF 2 sets 10 BIL Red tband HS curls 2 sets 10 3# LAQ 2 sets 10 Standing 3# SL hip flex,ext and abd 10 x each STS 10 x from mat     10/30/23  Eval, POC, HEP and education as below    Nustep L4x6 minutes all four extremities, seat 10  HEP practice and discussion    PATIENT EDUCATION:  Education details: exam findings, POC, HEP  Person educated: Patient Education method: Programmer, Multimedia, Demonstration, and Handouts Education comprehension: verbalized understanding, returned demonstration, and needs further education  HOME EXERCISE PROGRAM:  Access Code: [email or web address] URL: [email or web address] Date: 10/30/2023 Prepared by: Josette Rough  Exercises - Supine Figure 4 Piriformis Stretch  - 1 x daily - 7 x weekly - 2 sets - 4-6 reps - 30 seconds  hold - Supine Bridge with Resistance Band  - 1 x daily - 7 x weekly - 2 sets - 10 reps - 2 seconds  hold - Clamshell with Resistance  - 1 x daily - 7 x weekly - 2 sets - 10 reps - 1 second  hold - Seated Knee Extension with Anchored Resistance  - 1 x daily - 7 x weekly - 2 sets - 10 reps - 2 seconds  hold  ASSESSMENT: CLINICAL IMPRESSION: pnt arrives stating  feel better than I have Rt leg is still an issue , Posterior,lateral esp with flexion so getting in and out of cars is hard. Pnt continues to amb slowly with limited knee flexion, BIL ER and no heel strike. Good func ROM when tested . Continued to progress ex for func strengthening  Patient is a 68 y.o. F who was seen today for physical therapy  evaluation and treatment for M25.562 (ICD-10-CM) - Pain in left knee. Objectives as above. Has recent history of PE due to sedentary period, also has  history of Baker's cyst and limited success in PT in the past due to this. Will make every effort to improve pain and function moving forward.  OBJECTIVE IMPAIRMENTS: Abnormal gait, decreased activity tolerance, decreased mobility, difficulty walking, decreased ROM, decreased strength, impaired flexibility, obesity, and pain.   ACTIVITY LIMITATIONS: standing, squatting, stairs, transfers, and locomotion level  PARTICIPATION LIMITATIONS: meal prep, cleaning, laundry, driving, shopping, community activity, and yard work  PERSONAL FACTORS: Age, Behavior pattern, Education, Fitness, Past/current experiences, Social background, and Time since onset of injury/illness/exacerbation are also affecting patient's functional outcome.   REHAB POTENTIAL: Fair body habitus, chronicity of pain, limited success with PT in the past   CLINICAL DECISION MAKING: Stable/uncomplicated  EVALUATION COMPLEXITY: Low   GOALS: Goals reviewed with patient? No  SHORT TERM GOALS: Target date: 11/27/2023   Will be compliant with appropriate progressive HEP Goal status: 11/06/23 MET for initial HEP   2. Knee AROM flexion to be at least 115* B and extension AROM to be no more than 5* B  Goal status: Ongoing 11/14/23  and 11/21/23  11/27/23 met for ext and progressing for flexion . 12/18/23  Rt knee AROM   0-108               Left knee AROM 2-110   3. Will be independent with BLE edema management strategies  Goal status: progressing 11/21/23  10/16 /25 MET as she can verb tech but edema still an issue   4. Gait pattern to have normalized to include consistent step through pattern with good heel-toe mechanics, no toe out with gait Goal status: Progressing 11/14/23     11/27/23 progressing 12/11/23 progressing  12/18/23 progressing  01/13/24 progressing  LONG TERM GOALS:  Target date: 01/30/24    MMT to have improved by one grade all weak groups Goal status: Progressing 11/14/23  and 11/27/23 12/11/23 slight set back after fall , increased pain 12/18/23 MET   2. Will score at least 45 on Berg to show reduced fall risk  Goal status: 12/18/23 MET 01/02/24 51/56   3. Will be able to ascend and descend stairs reciprocally with no increase in pain Goal status: 12/18/23 progressing- still limited, Ongoing 12/31/23 and 01/13/24   4. Will be able to ambulate community distances and perform all household tasks with no increase in pain  Goal status: 12/11/23 progressing, ongoing 12/31/23  and 01/13/24   5. LEFS to have improved by at least 10 points to show improved QOL and subjective improvement     PLAN:  PT FREQUENCY: 2x/week  PT DURATION: 8 weeks  PLANNED INTERVENTIONS: 97750- Physical Performance Testing, 97110-Therapeutic exercises, 97530- Therapeutic activity, [id number]- Neuromuscular re-education, 97535- Self Care, [id number]- Manual therapy, [id number]- Gait training, [phone]- Aquatic Therapy, [phone]- Vasopneumatic device, and Patient/Family education  PLAN FOR NEXT SESSION: progress strength and func gait . Check LEFS  Jon Or , PTA 01/15/24 7:54 AM

## 2024-01-21 ENCOUNTER — Ambulatory Visit: Admitting: Physical Therapy

## 2024-01-23 ENCOUNTER — Ambulatory Visit: Admitting: Physical Therapy

## 2024-02-11 ENCOUNTER — Ambulatory Visit: Admitting: Physical Therapy

## 2024-02-11 ENCOUNTER — Encounter: Payer: Self-pay | Admitting: Physical Therapy

## 2024-02-11 DIAGNOSIS — M25662 Stiffness of left knee, not elsewhere classified: Secondary | ICD-10-CM

## 2024-02-11 DIAGNOSIS — M6281 Muscle weakness (generalized): Secondary | ICD-10-CM

## 2024-02-11 DIAGNOSIS — M25661 Stiffness of right knee, not elsewhere classified: Secondary | ICD-10-CM

## 2024-02-11 DIAGNOSIS — R262 Difficulty in walking, not elsewhere classified: Secondary | ICD-10-CM

## 2024-02-11 DIAGNOSIS — G8929 Other chronic pain: Secondary | ICD-10-CM

## 2024-02-11 DIAGNOSIS — M25561 Pain in right knee: Secondary | ICD-10-CM | POA: Diagnosis not present

## 2024-02-11 NOTE — Therapy (Signed)
 " OUTPATIENT PHYSICAL THERAPY LOWER EXTREMITY   Patient Name: Tiffany Strickland MRN: [id number] DOB:08/04/1955, 68 y.o., female Today's Date: 02/11/2024  END OF SESSION:  PT End of Session - 02/11/24 0755     Visit Number 14    Date for Recertification  03/13/24    PT Start Time 0755    PT Stop Time 0840    PT Time Calculation (min) 45 min    Activity Tolerance Patient tolerated treatment well    Behavior During Therapy [facility] for tasks assessed/performed           Past Medical History:  Diagnosis Date   Colon polyp    adenomatous   Diabetes mellitus without complication (HCC)    Hyperlipidemia    Hypertension    Pulmonary embolism (HCC)    Sleep apnea    Stroke (HCC)    Thyroid  disease    Past Surgical History:  Procedure Laterality Date   ABDOMINAL HYSTERECTOMY     BLADDER SUSPENSION     COLONOSCOPY WITH PROPOFOL  N/A 05/05/2020   Procedure: COLONOSCOPY WITH PROPOFOL ;  Surgeon: Rollin Dover, MD;  Location: [facility];  Service: Endoscopy;  Laterality: N/A;   EUS N/A 08/12/2013   Procedure: LOWER ENDOSCOPIC ULTRASOUND (EUS);  Surgeon: Toribio SHAUNNA Cedar, MD;  Location: [facility];  Service: Endoscopy;  Laterality: N/A;   KNEE SURGERY     POLYPECTOMY  05/05/2020   Procedure: POLYPECTOMY;  Surgeon: Rollin Dover, MD;  Location: [facility];  Service: Endoscopy;;   TUBAL LIGATION     Patient Active Problem List   Diagnosis Date Noted   Acute pulmonary embolism (HCC) 08/16/2023   Acute kidney injury 08/16/2023   Posterior neck pain 05/29/2021   Chronic idiopathic constipation 06/21/2020   Diverticular disease of colon 06/21/2020   Change in bowel habit 03/15/2020   Epigastric pain 03/15/2020   Gastroesophageal reflux disease 03/15/2020   Other specified bacterial intestinal infections 03/15/2020   Pulmonary hypertension (HCC) 06/29/2018   Educated about COVID-19 virus infection 06/29/2018   OSA (obstructive sleep apnea) 02/03/2017   Hypothyroidism 02/02/2017    Chest pain 02/02/2017   Obesity, Class III, BMI 40-49.9 (morbid obesity) (HCC) 01/27/2017   Plantar fasciitis of left foot 11/08/2015   Non-insulin  treated type 2 diabetes mellitus (HCC) 06/08/2015   Colon cancer screening 06/08/2015   Bilateral leg edema 12/15/2014   Blurry vision, bilateral 06/16/2014   Breast cancer screening 06/16/2014   Other specified hypothyroidism 06/16/2014   Other seasonal allergic rhinitis 06/16/2014   History of stroke 01/31/2014   Rectal nodule 09/06/2013   Essential hypertension 08/24/2013   Nonspecific (abnormal) findings on radiological and other examination of gastrointestinal tract 08/12/2013   Lateral epicondylitis of right elbow 07/21/2013   Right shoulder pain 07/21/2013   Sprain of wrist, right 07/21/2013   Constipation 04/15/2013   Rectal bleeding 04/15/2013    PCP: Rik Glinda DASEN, FNP  REFERRING PROVIDER: Rik Glinda DASEN, FNP  REFERRING DIAG: [phone] (ICD-10-CM) - Pain in left knee  THERAPY DIAG:  Chronic pain of right knee - Plan: PT plan of care cert/re-cert  Chronic pain of left knee - Plan: PT plan of care cert/re-cert  Difficulty in walking, not elsewhere classified - Plan: PT plan of care cert/re-cert  Muscle weakness (generalized) - Plan: PT plan of care cert/re-cert  Stiffness of left knee, not elsewhere classified - Plan: PT plan of care cert/re-cert  Stiffness of right knee, not elsewhere classified - Plan: PT plan of care cert/re-cert  Rationale for  Evaluation and Treatment: Rehabilitation  ONSET DATE:  Years off and on   SUBJECTIVE:   SUBJECTIVE STATEMENT:  Hurting, both knees are bad, but the left is worst. Sees MD today   PERTINENT HISTORY:  See above  PAIN: both knees 8.5/10    PRECAUTIONS: Other: active blood thinners due to recent PE   RED FLAGS: None   WEIGHT BEARING RESTRICTIONS: No  FALLS:  Has patient fallen in last 6 months? Yes. 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  DIAGNOSTIC FINDINGS:   Narrative & Impression CLINICAL DATA:  Chest pain   EXAM: PORTABLE CHEST 1 VIEW   COMPARISON:  Chest x-ray 08/16/2023   FINDINGS: The heart is mildly enlarged. The lungs are clear. There is no pleural effusion or pneumothorax. No acute fractures are seen.   IMPRESSION: Mild cardiomegaly. No acute pulmonary process.  IMPRESSION: 1. No pulmonary embolism. No acute intrathoracic pathology identified. 2. Morphologic changes in keeping with pulmonary arterial hypertension. 3. Mild cardiomegaly. 4. Mild hepatic steatosis.   PATIENT SURVEYS:   LEFS 46/80   COGNITION: Overall cognitive status: Within functional limits for tasks assessed     FLEXIBILITY  Tight hip ER groups, consistent toe out with gait and static positions Tight HS and hip flexors per observation     LOWER EXTREMITY ROM:  Active ROM Right eval Left eval Right AROM 11/14/23 Left AROM 11/14/23 RT/left 11/27/23 RT/Left 12/11/23 RT/Left 12/31/23 RT/Left 01/15/24 RT/LT  Hip flexion           Hip extension           Hip abduction           Hip adduction           Hip internal rotation           Hip external rotation           Knee flexion 103* 109* 102 100 110/115 108/98 100/106 104/107 100/105  Knee extension 9* 12* 3 0 0/0      Ankle dorsiflexion           Ankle plantarflexion           Ankle inversion           Ankle eversion            (Blank  rows = not tested)  LOWER EXTREMITY MMT:  MMT Right eval Left eval Right 11/14/23 Left 11/14/23 RT/Left RT /left 12/11/23 RT/Left 12/31/23  Hip flexion 4+ 4+ 5 5     Hip extension         Hip abduction 3 3+ 5 5     Hip adduction         Hip internal rotation         Hip external rotation         Knee flexion 4 4 4+ 4 4+/4+ 4+ BIL with pain 4+ bilat   Knee extension 4 4 4+ 4+  4+/4+ 4+ BIL with pain 5/4+  Ankle dorsiflexion         Ankle plantarflexion         Ankle inversion         Ankle eversion          (Blank rows = not tested)                                                                                                                                  TREATMENT DATE:  02/11/24 NuStep L5 x 6 min Lower Extremity Functional Score: 42 / 80 = 52.5 % Hamstring curls 35#  3 sets 10 Knee ext 10# 3 x10 Sit to stand 2x10   01/15/24 Nustep L 5 STS wt ball 10x chest press, 10 x OH Hamstring curls 35#  3 sets 10 Knee ext 10# 3 x10 3# alt step tap on 6 inch box with 1 UE support CGA 20 x, then repeat with 8 inch step 3 # Standing HS curls 2 sets 10 BIL Lateral step ups on 6 step with hip abd 10 x each leg with 3# 3# seated hip flexion into abd onto 6 in step 2 sets 10 BIL  01/13/24 Nustep L 5 Standing B shoulder extension with cable, 5# 20X Hamstring curls 35#  10 x  Knee ext 10# 2 x10 Seated rows 20# 2 x 10  Black tband trunk ext 20 x Lateral step ups on 6 step with hip abd 10 x each leg On slant board for B calf stretches  Sit to stand hi lo mat with yellow mediball 10 x 2 sets  Seated marches with green t band 10x 2 sets Seated green tband clams 2 sets 10 Seated hip abd ER and IR  with green t band 10x 2 sets   01/05/24: Nustep L 5 x 6 min x 4 exts Standing B shoulder extension with cable, 5# 20X Hamstring curls 35#  10 x  Knee ext 10# 2 x10 Seated rows 20# 2 x 10  Lateral step ups on 4 step with hip abd 10 x each leg On slant board for B calf  stretches  Sit to stand hi lo mat with red mediball 10 x \ Seated marches with green t band 10x Seated hip abd ER with green t band 10x   01/02/24 BERG 51/56 Black bar heel raises 2 sets 10and toe raises 8 x and stopped with pain Slant board stretch 3 x 20 sec Leg press 30# 2 sets 10. Calf raises 30# 2 sets 10 Cable pulley shld ext 5# 2 sets 10 Cable row 10# 2 sets 10 3# LAQ with ADD ball squeeze 2 sets 10 Green tband HS curl 2 sets 10 Green tband clams and hip flexion 2 sets 10   12/31/23 Sit to stands 2x10 HS curl 35# 2 sets 12 Knee ext 15# 2 sets 12 NuStep L5 x 6 min Resisted  forward and backward walking 30lb Shoulder Ext 5lb 2x10 Hip add ball squeeze  12/18/23 Nustep L 5 - verb 8/10 pain pain she arrived with was gone after this MMT grossly tested BIL LE hip and knee WFL without pain AROM BIL knee- see below BERG 50/56 HS curl 35# 2 sets 10 Knee ext 15# 2 sets 10 Back ext black band 2 sets 10 Working on stairs , step over step  4 in and  6 in 2 rail , then 1 rail 6 inch step up BIL UE support step up with opp leg ext then laterally with opp leg abd STS with wt ball chest press 10 x   12/11/23 Nustep L 5 6 min MMT/ROM and goals assessed and documented Seated dyna disc 4 way 10 x each for lumbar ROM 3# on dyna disc 2 sets 10 LAQ,hip flex and hip abd Green tband HS curl 2 sets 10 STS with wt ball press 10 x 30 # resisted gait 4 ways 4 x each   11/27/23 Nustep L 5 LE only STS with wt ball press 10x 2 sets Farmers carry 8# 2 laps each hand with seated rest needed btwn 6 inch step up with opp leg ext 10 x- with UE support 6 inch lateral step up with opp leg abd 10x with UE support- hip weakness noted with lateral step up vs fwd L> RT Foam beam in // bars side stepping 5 x each way with light HHA, then tandem fwd and backward 5 x Knee ext 15 # 2 sets 10 HS curl 30# 2 sets 10   11/25/23 Resisted gait 40# 4 ways 5 x each Black bar heel raises 2 sets  10 Knee ext 10 # 2 sets 10 HS curl 25# 2 sets 10 6 inch step up 10 x BIL the side step up 10 x BIL- with UE support Seated blue tband hip flex and abd 2 sets 10 STS 10 x with wt ball 10 x 2 sets Nustep L 5  LE only    11/21/23 AROM Left knee  0-105 AROM RT  knee 0-102 Goals assessed LAQ 3# 2 sets 10 Standing with UE support 3# 12 x HS curl, hip ext,hip abd and hip flexion 3# marching fwd and walking backward 10 feet 5 x each 3# side stepping 5 x 10 feet Leg Press 40# 2 sets 10. Calf raises 2 sets 10 STS with wt ball 2 sets 10 Resisted gait 30# 4 ways 5 x each      11/14/23 NuStep L5 x 6 min PROM to bilateral knees  Patellar mobs (difficulty relaxing) Sit to stands 2x10 6 in step ups 2x5 30lb resisted side steps x5 each   11/06/23 Nustep L 6 5 min Resisted gait fwd and back 4 x each then laterally 3 x 20# Black bar heel raises  15 x ( attempted toe raise but unable) Red tband DF 2 sets 10 BIL Red tband HS curls 2 sets 10 3# LAQ 2 sets 10 Standing 3# SL hip flex,ext and abd 10 x each STS 10 x from mat     10/30/23  Eval, POC, HEP and education as below    Nustep L4x6 minutes all four extremities, seat 10  HEP practice and discussion    PATIENT EDUCATION:  Education details: exam findings, POC, HEP  Person educated: Patient Education method: Explanation, Demonstration, and Handouts Education comprehension: verbalized understanding, returned demonstration, and needs further education  HOME EXERCISE PROGRAM:  Access  Code: [email or web address] URL: [email or web address] Date: 10/30/2023 Prepared by: Josette Rough  Exercises - Supine Figure 4 Piriformis Stretch  - 1 x daily - 7 x weekly - 2 sets - 4-6 reps - 30 seconds  hold - Supine Bridge with Resistance Band  - 1 x daily - 7 x weekly - 2 sets - 10 reps - 2 seconds  hold - Clamshell with Resistance  - 1 x daily - 7 x weekly - 2 sets - 10 reps - 1 second  hold - Seated Knee Extension with  Anchored Resistance  - 1 x daily - 7 x weekly - 2 sets - 10 reps - 2 seconds  hold  ASSESSMENT: CLINICAL IMPRESSION: pnt arrives stating  hurting LT leg worst than Right Pnt continues to amb slowly with limited knee flexion, BIL ER and no heel strike. LEFS does revel some limitations. Fatigue with functional interventions. Continued to progress ex for func strengthening  Patient is a 68 y.o. F who was seen today for physical therapy evaluation and treatment for M25.562 (ICD-10-CM) - Pain in left knee. Objectives as above. Has recent history of PE due to sedentary period, also has history of Baker's cyst and limited success in PT in the past due to this. Will make every effort to improve pain and function moving forward.  OBJECTIVE IMPAIRMENTS: Abnormal gait, decreased activity tolerance, decreased mobility, difficulty walking, decreased ROM, decreased strength, impaired flexibility, obesity, and pain.   ACTIVITY LIMITATIONS: standing, squatting, stairs, transfers, and locomotion level  PARTICIPATION LIMITATIONS: meal prep, cleaning, laundry, driving, shopping, community activity, and yard work  PERSONAL FACTORS: Age, Behavior pattern, Education, Fitness, Past/current experiences, Social background, and Time since onset of injury/illness/exacerbation are also affecting patient's functional outcome.   REHAB POTENTIAL: Fair body habitus, chronicity of pain, limited success with PT in the past   CLINICAL DECISION MAKING: Stable/uncomplicated  EVALUATION COMPLEXITY: Low   GOALS: Goals reviewed with patient? No  SHORT TERM GOALS: Target date: 11/27/2023   Will be compliant with appropriate progressive HEP Goal status: 11/06/23 MET for initial HEP   2. Knee AROM flexion to be at least 115* B and extension AROM to be no more than 5* B  Goal status: Ongoing 11/14/23  and 11/21/23  11/27/23 met for ext and progressing for flexion . 12/18/23  Rt knee AROM   0-108               Left knee AROM  2-110, Ongoing 02/11/24   3. Will be independent with BLE edema management strategies  Goal status: progressing 11/21/23  10/16 /25 MET as she can verb tech but edema still an issue   4. Gait pattern to have normalized to include consistent step through pattern with good heel-toe mechanics, no toe out with gait Goal status: Progressing 11/14/23     11/27/23 progressing 12/11/23 progressing  12/18/23 progressing  01/13/24 progressing, ongoing 02/11/24  LONG TERM GOALS: Target date: 01/30/24    MMT to have improved by one grade all weak groups Goal status: Progressing 11/14/23  and 11/27/23 12/11/23 slight set back after fall , increased pain 12/18/23 MET   2. Will score at least 45 on Berg to show reduced fall risk  Goal status: 12/18/23 MET 01/02/24 51/56   3. Will be able to ascend and descend stairs reciprocally with no increase in pain Goal status: 12/18/23 progressing- still limited, Ongoing 12/31/23 and 01/13/24   4. Will be able to ambulate community distances and perform all household tasks with no  increase in pain  Goal status: 12/11/23 progressing, ongoing 12/31/23  and 01/13/24, pain can be form 0-8 02/11/24   5. LEFS to have improved by at least 10 points to show improved QOL and subjective improvement   Ongoing 02/11/24    PLAN:  PT FREQUENCY: 2x/week  PT DURATION: 8 weeks  PLANNED INTERVENTIONS: 97750- Physical Performance Testing, 97110-Therapeutic exercises, 97530- Therapeutic activity, [id number]- Neuromuscular re-education, 97535- Self Care, [id number]- Manual therapy, [id number]- Gait training, [phone]- Aquatic Therapy, [phone]- Vasopneumatic device, and Patient/Family education  PLAN FOR NEXT SESSION: progress strength and func gait .  Tanda Sorrow, PTA 02/11/2024 1:10 PM  "

## 2024-02-16 ENCOUNTER — Ambulatory Visit: Attending: Family

## 2024-02-16 DIAGNOSIS — M25661 Stiffness of right knee, not elsewhere classified: Secondary | ICD-10-CM | POA: Insufficient documentation

## 2024-02-16 DIAGNOSIS — M25662 Stiffness of left knee, not elsewhere classified: Secondary | ICD-10-CM | POA: Insufficient documentation

## 2024-02-16 DIAGNOSIS — M25562 Pain in left knee: Secondary | ICD-10-CM | POA: Insufficient documentation

## 2024-02-16 DIAGNOSIS — R262 Difficulty in walking, not elsewhere classified: Secondary | ICD-10-CM | POA: Insufficient documentation

## 2024-02-16 DIAGNOSIS — M25561 Pain in right knee: Secondary | ICD-10-CM | POA: Insufficient documentation

## 2024-02-16 DIAGNOSIS — G8929 Other chronic pain: Secondary | ICD-10-CM | POA: Insufficient documentation

## 2024-02-16 DIAGNOSIS — M6281 Muscle weakness (generalized): Secondary | ICD-10-CM | POA: Insufficient documentation

## 2024-02-19 ENCOUNTER — Ambulatory Visit: Admitting: Podiatry

## 2024-02-20 ENCOUNTER — Ambulatory Visit: Admitting: Physical Therapy

## 2024-02-24 ENCOUNTER — Ambulatory Visit: Admitting: Physical Therapy

## 2024-03-04 ENCOUNTER — Encounter: Payer: Self-pay | Admitting: Physical Therapy

## 2024-03-04 ENCOUNTER — Ambulatory Visit: Admitting: Physical Therapy

## 2024-03-04 DIAGNOSIS — R262 Difficulty in walking, not elsewhere classified: Secondary | ICD-10-CM

## 2024-03-04 DIAGNOSIS — M6281 Muscle weakness (generalized): Secondary | ICD-10-CM | POA: Diagnosis present

## 2024-03-04 DIAGNOSIS — M25561 Pain in right knee: Secondary | ICD-10-CM | POA: Diagnosis present

## 2024-03-04 DIAGNOSIS — M25662 Stiffness of left knee, not elsewhere classified: Secondary | ICD-10-CM | POA: Diagnosis present

## 2024-03-04 DIAGNOSIS — M25661 Stiffness of right knee, not elsewhere classified: Secondary | ICD-10-CM

## 2024-03-04 DIAGNOSIS — G8929 Other chronic pain: Secondary | ICD-10-CM

## 2024-03-04 DIAGNOSIS — M25562 Pain in left knee: Secondary | ICD-10-CM | POA: Diagnosis present

## 2024-03-04 NOTE — Therapy (Signed)
 " OUTPATIENT PHYSICAL THERAPY LOWER EXTREMITY   Patient Name: Tiffany Strickland MRN: [id number] DOB:03-24-1955, 69 y.o., female Today's Date: 03/04/2024  END OF SESSION:  PT End of Session - 03/04/24 1427     Visit Number 15    Date for Recertification  03/13/24    PT Start Time 1430    PT Stop Time 1515    PT Time Calculation (min) 45 min    Activity Tolerance Patient tolerated treatment well    Behavior During Therapy [facility] for tasks assessed/performed           Past Medical History:  Diagnosis Date   Colon polyp    adenomatous   Diabetes mellitus without complication (HCC)    Hyperlipidemia    Hypertension    Pulmonary embolism (HCC)    Sleep apnea    Stroke (HCC)    Thyroid  disease    Past Surgical History:  Procedure Laterality Date   ABDOMINAL HYSTERECTOMY     BLADDER SUSPENSION     COLONOSCOPY WITH PROPOFOL  N/A 05/05/2020   Procedure: COLONOSCOPY WITH PROPOFOL ;  Surgeon: Rollin Dover, MD;  Location: [facility];  Service: Endoscopy;  Laterality: N/A;   EUS N/A 08/12/2013   Procedure: LOWER ENDOSCOPIC ULTRASOUND (EUS);  Surgeon: Toribio SHAUNNA Cedar, MD;  Location: [facility];  Service: Endoscopy;  Laterality: N/A;   KNEE SURGERY     POLYPECTOMY  05/05/2020   Procedure: POLYPECTOMY;  Surgeon: Rollin Dover, MD;  Location: [facility];  Service: Endoscopy;;   TUBAL LIGATION     Patient Active Problem List   Diagnosis Date Noted   Acute pulmonary embolism (HCC) 08/16/2023   Acute kidney injury 08/16/2023   Posterior neck pain 05/29/2021   Chronic idiopathic constipation 06/21/2020   Diverticular disease of colon 06/21/2020   Change in bowel habit 03/15/2020   Epigastric pain 03/15/2020   Gastroesophageal reflux disease 03/15/2020   Other specified bacterial intestinal infections 03/15/2020   Pulmonary hypertension (HCC) 06/29/2018   Educated about COVID-19 virus infection 06/29/2018   OSA (obstructive sleep apnea) 02/03/2017   Hypothyroidism 02/02/2017    Chest pain 02/02/2017   Obesity, Class III, BMI 40-49.9 (morbid obesity) (HCC) 01/27/2017   Plantar fasciitis of left foot 11/08/2015   Non-insulin  treated type 2 diabetes mellitus (HCC) 06/08/2015   Colon cancer screening 06/08/2015   Bilateral leg edema 12/15/2014   Blurry vision, bilateral 06/16/2014   Breast cancer screening 06/16/2014   Other specified hypothyroidism 06/16/2014   Other seasonal allergic rhinitis 06/16/2014   History of stroke 01/31/2014   Rectal nodule 09/06/2013   Essential hypertension 08/24/2013   Nonspecific (abnormal) findings on radiological and other examination of gastrointestinal tract 08/12/2013   Lateral epicondylitis of right elbow 07/21/2013   Right shoulder pain 07/21/2013   Sprain of wrist, right 07/21/2013   Constipation 04/15/2013   Rectal bleeding 04/15/2013    PCP: Rik Glinda DASEN, FNP  REFERRING PROVIDER: Rik Glinda DASEN, FNP  REFERRING DIAG: [phone] (ICD-10-CM) - Pain in left knee  THERAPY DIAG:  Chronic pain of right knee  Chronic pain of left knee  Difficulty in walking, not elsewhere classified  Stiffness of left knee, not elsewhere classified  Stiffness of right knee, not elsewhere classified  Muscle weakness (generalized)  Rationale for Evaluation and Treatment: Rehabilitation  ONSET DATE:  Years off and on   SUBJECTIVE:   SUBJECTIVE STATEMENT:  Pt repots that she has been sick and having trouble with transportation. I got new everything MD appointment was good, suppose to  send a new referral for PT. Knees still hurt.   PERTINENT HISTORY:  See above  PAIN: Both ankles 8.5/10    PRECAUTIONS: Other: active blood thinners due to recent PE   RED FLAGS: None   WEIGHT BEARING RESTRICTIONS: No  FALLS:  Has patient fallen in last 6 months? Yes. 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  DIAGNOSTIC FINDINGS:   Narrative & Impression CLINICAL DATA:  Chest pain   EXAM: PORTABLE CHEST 1 VIEW   COMPARISON:  Chest x-ray 08/16/2023   FINDINGS: The heart is mildly enlarged. The lungs are clear. There is no pleural effusion or pneumothorax. No acute fractures are seen.   IMPRESSION: Mild cardiomegaly. No acute pulmonary process.  IMPRESSION: 1. No pulmonary embolism. No acute intrathoracic pathology identified. 2. Morphologic changes in keeping with pulmonary arterial hypertension. 3. Mild cardiomegaly. 4. Mild hepatic steatosis.   PATIENT SURVEYS:   LEFS 46/80   COGNITION: Overall cognitive status: Within functional limits for tasks assessed     FLEXIBILITY  Tight hip ER groups, consistent toe out with gait and static positions Tight HS and hip flexors per observation     LOWER EXTREMITY ROM:  Active ROM Right eval Left eval Right AROM 11/14/23 Left AROM 11/14/23 RT/left 11/27/23 RT/Left 12/11/23 RT/Left 12/31/23 RT/Left 01/15/24 RT/LT RT/LT 03/04/24  Hip flexion            Hip extension            Hip abduction            Hip adduction            Hip internal rotation            Hip external rotation            Knee flexion 103* 109* 102 100 110/115 108/98 100/106 104/107 100/105 100/101  Knee extension 9* 12* 3 0 0/0       Ankle dorsiflexion            Ankle plantarflexion            Ankle inversion            Ankle eversion             (Blank rows = not tested)  LOWER EXTREMITY MMT:  MMT Right eval Left eval  Right 11/14/23 Left 11/14/23 RT/Left RT /left 12/11/23 RT/Left 12/31/23  Hip flexion 4+ 4+ 5 5     Hip extension         Hip abduction 3 3+ 5 5     Hip adduction         Hip internal rotation         Hip external rotation         Knee flexion 4 4 4+ 4 4+/4+ 4+ BIL with pain 4+ bilat   Knee extension 4 4 4+ 4+ 4+/4+ 4+ BIL with pain 5/4+  Ankle dorsiflexion  Ankle plantarflexion         Ankle inversion         Ankle eversion          (Blank rows = not tested)                                                                                                                                  TREATMENT DATE:  03/04/24 NuStep L5 x 6 min Bilateral knee PROM with end range holds Went over HEP and gave pt another print out Sit to stands 2x10 6in forward & Lateral step ups x10 each 2 rails HS curls 35lb 2x10 Leg Ext 10lb 2x10  02/11/24 NuStep L5 x 6 min Lower Extremity Functional Score: 42 / 80 = 52.5 % Hamstring curls 35#  3 sets 10 Knee ext 10# 3 x10 Sit to stand 2x10   01/15/24 Nustep L 5 STS wt ball 10x chest press, 10 x OH Hamstring curls 35#  3 sets 10 Knee ext 10# 3 x10 3# alt step tap on 6 inch box with 1 UE support CGA 20 x, then repeat with 8 inch step 3 # Standing HS curls 2 sets 10 BIL Lateral step ups on 6 step with hip abd 10 x each leg with 3# 3# seated hip flexion into abd onto 6 in step 2 sets 10 BIL  01/13/24 Nustep L 5 Standing B shoulder extension with cable, 5# 20X Hamstring curls 35#  10 x  Knee ext 10# 2 x10 Seated rows 20# 2 x 10  Black tband trunk ext 20 x Lateral step ups on 6 step with hip abd 10 x each leg On slant board for B calf stretches  Sit to stand hi lo mat with yellow mediball 10 x 2 sets  Seated marches with green t band 10x 2 sets Seated green tband clams 2 sets 10 Seated hip abd ER and IR  with green t band 10x 2 sets   01/05/24: Nustep L 5 x 6 min x 4 exts Standing B shoulder extension with cable, 5#  20X Hamstring curls 35#  10 x  Knee ext 10# 2 x10 Seated rows 20# 2 x 10  Lateral step ups on 4 step with hip abd 10 x each leg On slant board for B calf stretches  Sit to stand hi lo mat with red mediball 10 x \ Seated marches with green t band 10x Seated hip abd ER with green t band 10x   01/02/24 BERG 51/56 Black bar heel raises 2 sets 10and toe raises 8 x and stopped with pain Slant board stretch 3 x 20 sec Leg press 30# 2 sets 10. Calf raises 30# 2 sets 10 Cable pulley shld ext 5# 2 sets 10 Cable row 10# 2 sets 10 3# LAQ with ADD ball squeeze 2 sets 10 Green tband HS curl 2 sets 10 Green tband clams and hip flexion  2 sets 10   12/31/23 Sit to stands 2x10 HS curl 35# 2 sets 12 Knee ext 15# 2 sets 12 NuStep L5 x 6 min Resisted forward and backward walking 30lb Shoulder Ext 5lb 2x10 Hip add ball squeeze  12/18/23 Nustep L 5 - verb 8/10 pain pain she arrived with was gone after this MMT grossly tested BIL LE hip and knee WFL without pain AROM BIL knee- see below BERG 50/56 HS curl 35# 2 sets 10 Knee ext 15# 2 sets 10 Back ext black band 2 sets 10 Working on stairs , step over step  4 in and  6 in 2 rail , then 1 rail 6 inch step up BIL UE support step up with opp leg ext then laterally with opp leg abd STS with wt ball chest press 10 x   12/11/23 Nustep L 5 6 min MMT/ROM and goals assessed and documented Seated dyna disc 4 way 10 x each for lumbar ROM 3# on dyna disc 2 sets 10 LAQ,hip flex and hip abd Green tband HS curl 2 sets 10 STS with wt ball press 10 x 30 # resisted gait 4 ways 4 x each   11/27/23 Nustep L 5 LE only STS with wt ball press 10x 2 sets Farmers carry 8# 2 laps each hand with seated rest needed btwn 6 inch step up with opp leg ext 10 x- with UE support 6 inch lateral step up with opp leg abd 10x with UE support- hip weakness noted with lateral step up vs fwd L> RT Foam beam in // bars side stepping 5 x each way with light HHA,  then tandem fwd and backward 5 x Knee ext 15 # 2 sets 10 HS curl 30# 2 sets 10   11/25/23 Resisted gait 40# 4 ways 5 x each Black bar heel raises 2 sets 10 Knee ext 10 # 2 sets 10 HS curl 25# 2 sets 10 6 inch step up 10 x BIL the side step up 10 x BIL- with UE support Seated blue tband hip flex and abd 2 sets 10 STS 10 x with wt ball 10 x 2 sets Nustep L 5  LE only    11/21/23 AROM Left knee  0-105 AROM RT  knee 0-102 Goals assessed LAQ 3# 2 sets 10 Standing with UE support 3# 12 x HS curl, hip ext,hip abd and hip flexion 3# marching fwd and walking backward 10 feet 5 x each 3# side stepping 5 x 10 feet Leg Press 40# 2 sets 10. Calf raises 2 sets 10 STS with wt ball 2 sets 10 Resisted gait 30# 4 ways 5 x each      11/14/23 NuStep L5 x 6 min PROM to bilateral knees  Patellar mobs (difficulty relaxing) Sit to stands 2x10 6 in step ups 2x5 30lb resisted side steps x5 each   11/06/23 Nustep L 6 5 min Resisted gait fwd and back 4 x each then laterally 3 x 20# Black bar heel raises  15 x ( attempted toe raise but unable) Red tband DF 2 sets 10 BIL Red tband HS curls 2 sets 10 3# LAQ 2 sets 10 Standing 3# SL hip flex,ext and abd 10 x each STS 10 x from mat     10/30/23  Eval, POC, HEP and education as below    Nustep L4x6 minutes all four extremities, seat 10  HEP practice and discussion    PATIENT EDUCATION:  Education details: exam findings,  POC, HEP  Person educated: Patient Education method: Explanation, Demonstration, and Handouts Education comprehension: verbalized understanding, returned demonstration, and needs further education  HOME EXERCISE PROGRAM: Access Code: [email or web address] URL: [email or web address] Date: 03/04/2024 Prepared by: Tanda Sorrow  Exercises - Supine Figure 4 Piriformis Stretch  - 1 x daily - 7 x weekly - 2 sets - 4-6 reps - 30 seconds  hold - Supine Bridge with Resistance Band  - 1 x daily - 7 x weekly -  2 sets - 10 reps - 2 seconds  hold - Clamshell with Resistance  - 1 x daily - 7 x weekly - 2 sets - 10 reps - 1 second  hold - Seated Knee Extension with Anchored Resistance  - 1 x daily - 7 x weekly - 2 sets - 10 reps - 2 seconds  hold - Sit to Stand  - 1 x daily - 7 x weekly - 3 sets - 10 reps  ASSESSMENT: CLINICAL IMPRESSION: pnt arrives stating her ankle hurting. Pain reported with passive knee flexion at end range. Knee ROM remains limited overall. She reports missing therapy due to being sick and having transportation issues. He Fatigue with functional interventions. Cue for full ROM needed with leg ext.  Pt verbalized understanding with HEP.   Patient is a 69 y.o. F who was seen today for physical therapy evaluation and treatment for M25.562 (ICD-10-CM) - Pain in left knee. Objectives as above. Has recent history of PE due to sedentary period, also has history of Baker's cyst and limited success in PT in the past due to this. Will make every effort to improve pain and function moving forward.  OBJECTIVE IMPAIRMENTS: Abnormal gait, decreased activity tolerance, decreased mobility, difficulty walking, decreased ROM, decreased strength, impaired flexibility, obesity, and pain.   ACTIVITY LIMITATIONS: standing, squatting, stairs, transfers, and locomotion level  PARTICIPATION LIMITATIONS: meal prep, cleaning, laundry, driving, shopping, community activity, and yard work  PERSONAL FACTORS: Age, Behavior pattern, Education, Fitness, Past/current experiences, Social background, and Time since onset of injury/illness/exacerbation are also affecting patient's functional outcome.   REHAB POTENTIAL: Fair body habitus, chronicity of pain, limited success with PT in the past   CLINICAL DECISION MAKING: Stable/uncomplicated  EVALUATION COMPLEXITY: Low   GOALS: Goals reviewed with patient? No  SHORT TERM GOALS: Target date: 11/27/2023   Will be compliant with appropriate progressive HEP Goal  status: 11/06/23 MET for initial HEP   2. Knee AROM flexion to be at least 115* B and extension AROM to be no more than 5* B  Goal status: Ongoing 11/14/23  and 11/21/23  11/27/23 met for ext and progressing for flexion . 12/18/23  Rt knee AROM   0-108               Left knee AROM 2-110, Ongoing 02/11/24   3. Will be independent with BLE edema management strategies  Goal status: progressing 11/21/23  10/16 /25 MET as she can verb tech but edema still an issue   4. Gait pattern to have normalized to include consistent step through pattern with good heel-toe mechanics, no toe out with gait Goal status: Progressing 11/14/23     11/27/23 progressing 12/11/23 progressing  12/18/23 progressing  01/13/24 progressing, ongoing 02/11/24  LONG TERM GOALS: Target date: 01/30/24    MMT to have improved by one grade all weak groups Goal status: Progressing 11/14/23  and 11/27/23 12/11/23 slight set back after fall , increased pain 12/18/23 MET   2. Will score at least 45 on  Berg to show reduced fall risk  Goal status: 12/18/23 MET 01/02/24 51/56   3. Will be able to ascend and descend stairs reciprocally with no increase in pain Goal status: 12/18/23 progressing- still limited, Ongoing 12/31/23 and 01/13/24   4. Will be able to ambulate community distances and perform all household tasks with no increase in pain  Goal status: 12/11/23 progressing, ongoing 12/31/23  and 01/13/24, pain can be form 0-8 02/11/24, 8/10 ongoing 03/04/24   5. LEFS to have improved by at least 10 points to show improved QOL and subjective improvement   Ongoing 02/11/24    PLAN:  PT FREQUENCY: 2x/week  PT DURATION: 8 weeks  PLANNED INTERVENTIONS: 97750- Physical Performance Testing, 97110-Therapeutic exercises, 97530- Therapeutic activity, [id number]- Neuromuscular re-education, 97535- Self Care, [id number]- Manual therapy, [id number]- Gait training, [phone]- Aquatic Therapy, [phone]- Vasopneumatic device, and Patient/Family  education  PLAN FOR NEXT SESSION: progress strength and func gait .  Tanda Sorrow, PTA 03/04/24 2:27 PM  "

## 2024-03-10 ENCOUNTER — Encounter: Payer: Self-pay | Admitting: Podiatry

## 2024-03-10 ENCOUNTER — Ambulatory Visit (INDEPENDENT_AMBULATORY_CARE_PROVIDER_SITE_OTHER): Admitting: Podiatry

## 2024-03-10 DIAGNOSIS — M79674 Pain in right toe(s): Secondary | ICD-10-CM | POA: Diagnosis not present

## 2024-03-10 DIAGNOSIS — M79675 Pain in left toe(s): Secondary | ICD-10-CM

## 2024-03-10 DIAGNOSIS — B351 Tinea unguium: Secondary | ICD-10-CM

## 2024-03-10 DIAGNOSIS — E1142 Type 2 diabetes mellitus with diabetic polyneuropathy: Secondary | ICD-10-CM | POA: Diagnosis not present

## 2024-03-13 NOTE — Progress Notes (Signed)
"  °  Subjective:  [name], female    DOB: 05/21/1955,  MRN: [id number]  Tiffany Strickland presents to clinic today for at risk foot care with history of diabetic neuropathy and painful mycotic toenails of both feet that are difficult to trim. Pain interferes with daily activities and wearing enclosed shoe gear comfortably.  Chief Complaint  Patient presents with   Diabetes    DFC NIDDM A1C 6.8. Toenail trim. LOV with PCP [facility] 02/2024.   New problem(s): None.   PCP is Combs, Isaiah Bottcher, DO.  Allergies[1]  Review of Systems: Negative except as noted in the HPI.  Objective: No changes noted in today's physical examination. There were no vitals filed for this visit. Tiffany Strickland is a pleasant 69 y.o. female morbidly obese in NAD. AAO x 3.  Vascular Examination: Capillary refill time immediate b/l. Vascular status intact b/l with palpable DP pulses; faintly palpable PT pulses. Pedal hair absent b/l. No edema. No pain with calf compression b/l. Skin temperature gradient WNL b/l. No cyanosis or clubbing noted b/l. No ischemia or gangrene b/l. Dependent edema noted b/l LE. Evidence of skin changes consistent with long term venous stasis BLE.  Neurological Examination: Sensation grossly intact b/l with 10 gram monofilament. Vibratory sensation intact b/l. Pt has subjective symptoms of neuropathy.  Dermatological Examination: Pedal skin with normal turgor, texture and tone b/l.  No open wounds. No interdigital macerations.   Toenails 1-5 b/l thick, discolored, elongated with subungual debris and pain on dorsal palpation.   Hyperkeratotic lesion(s) medial IPJ of left great toe and medial IPJ of right great toe.  No erythema, no edema, no drainage, no fluctuance.  Musculoskeletal Examination: Muscle strength 5/5 to all lower extremity muscle groups bilaterally. No pain, crepitus or joint limitation noted with ROM bilateral LE. HAV with bunion  deformity noted b/l LE. Hammertoe(s) b/l 5th toes.  Radiographs: None  Assessment/Plan: 1. Pain due to onychomycosis of toenails of both feet   2. Diabetic peripheral neuropathy associated with type 2 diabetes mellitus (HCC)    Patient was evaluated and treated. All patient's and/or POA's questions/concerns addressed on today's visit. Mycotic toenails 1-5 b/l debrided in length and girth without incident. Callus(es) medial IPJ of left great toe and medial IPJ of right great toe pared with sharp debridement without incident. Continue daily foot inspections and monitor blood glucose per PCP/Endocrinologist's recommendations. Continue soft, supportive shoe gear daily. Report any pedal injuries to medical professional. Call office if there are any questions/concerns. -Patient/POA to call should there be question/concern in the interim.   Return in about 3 months (around 06/08/2024).  Delon LITTIE Merlin, DPM      [facility] LOCATION: [address] [place] [address]                   Office [phone]   [facility] LOCATION: [address] [place], [place] [address] Office [phone]     [1]  Allergies Allergen Reactions   Pollen Extract Other (See Comments)    Seasonal allergies   Tomato Itching   Wool Alcohol [Lanolin] Itching    Reaction unknown   "

## 2024-03-16 ENCOUNTER — Encounter: Payer: Self-pay | Admitting: Physical Therapy

## 2024-03-16 ENCOUNTER — Ambulatory Visit: Admitting: Physical Therapy

## 2024-03-16 DIAGNOSIS — G8929 Other chronic pain: Secondary | ICD-10-CM

## 2024-03-16 DIAGNOSIS — M25662 Stiffness of left knee, not elsewhere classified: Secondary | ICD-10-CM

## 2024-03-16 DIAGNOSIS — R262 Difficulty in walking, not elsewhere classified: Secondary | ICD-10-CM

## 2024-03-16 DIAGNOSIS — M25661 Stiffness of right knee, not elsewhere classified: Secondary | ICD-10-CM

## 2024-03-16 DIAGNOSIS — M6281 Muscle weakness (generalized): Secondary | ICD-10-CM

## 2024-03-18 ENCOUNTER — Ambulatory Visit: Admitting: Physical Therapy

## 2024-03-18 ENCOUNTER — Encounter: Payer: Self-pay | Admitting: Physical Therapy

## 2024-03-18 DIAGNOSIS — M6281 Muscle weakness (generalized): Secondary | ICD-10-CM

## 2024-03-18 DIAGNOSIS — M25662 Stiffness of left knee, not elsewhere classified: Secondary | ICD-10-CM

## 2024-03-18 DIAGNOSIS — M25661 Stiffness of right knee, not elsewhere classified: Secondary | ICD-10-CM

## 2024-03-18 DIAGNOSIS — G8929 Other chronic pain: Secondary | ICD-10-CM

## 2024-03-18 DIAGNOSIS — R262 Difficulty in walking, not elsewhere classified: Secondary | ICD-10-CM

## 2024-03-18 NOTE — Therapy (Signed)
 " OUTPATIENT PHYSICAL THERAPY LOWER EXTREMITY   Patient Name: Tiffany Strickland MRN: [id number] DOB:Mar 11, 1955, 69 y.o., female Today's Date: 03/18/2024  END OF SESSION:  PT End of Session - 03/18/24 1410     Visit Number 17    Date for Recertification  04/13/24    PT Start Time 1410    PT Stop Time 1455    PT Time Calculation (min) 45 min    Activity Tolerance Patient tolerated treatment well    Behavior During Therapy [facility] for tasks assessed/performed           Past Medical History:  Diagnosis Date   Colon polyp    adenomatous   Diabetes mellitus without complication (HCC)    Hyperlipidemia    Hypertension    Pulmonary embolism (HCC)    Sleep apnea    Stroke (HCC)    Thyroid  disease    Past Surgical History:  Procedure Laterality Date   ABDOMINAL HYSTERECTOMY     BLADDER SUSPENSION     COLONOSCOPY WITH PROPOFOL  N/A 05/05/2020   Procedure: COLONOSCOPY WITH PROPOFOL ;  Surgeon: Rollin Dover, MD;  Location: [facility];  Service: Endoscopy;  Laterality: N/A;   EUS N/A 08/12/2013   Procedure: LOWER ENDOSCOPIC ULTRASOUND (EUS);  Surgeon: Toribio SHAUNNA Cedar, MD;  Location: [facility];  Service: Endoscopy;  Laterality: N/A;   KNEE SURGERY     POLYPECTOMY  05/05/2020   Procedure: POLYPECTOMY;  Surgeon: Rollin Dover, MD;  Location: [facility];  Service: Endoscopy;;   TUBAL LIGATION     Patient Active Problem List   Diagnosis Date Noted   Acute pulmonary embolism (HCC) 08/16/2023   Acute kidney injury 08/16/2023   Posterior neck pain 05/29/2021   Chronic idiopathic constipation 06/21/2020   Diverticular disease of colon 06/21/2020   Change in bowel habit 03/15/2020   Epigastric pain 03/15/2020   Gastroesophageal reflux disease 03/15/2020   Other specified bacterial intestinal infections 03/15/2020   Pulmonary hypertension (HCC) 06/29/2018   Educated about COVID-19 virus infection 06/29/2018   OSA (obstructive sleep apnea) 02/03/2017   Hypothyroidism 02/02/2017    Chest pain 02/02/2017   Obesity, Class III, BMI 40-49.9 (morbid obesity) (HCC) 01/27/2017   Plantar fasciitis of left foot 11/08/2015   Non-insulin  treated type 2 diabetes mellitus (HCC) 06/08/2015   Colon cancer screening 06/08/2015   Bilateral leg edema 12/15/2014   Blurry vision, bilateral 06/16/2014   Breast cancer screening 06/16/2014   Other specified hypothyroidism 06/16/2014   Other seasonal allergic rhinitis 06/16/2014   History of stroke 01/31/2014   Rectal nodule 09/06/2013   Essential hypertension 08/24/2013   Nonspecific (abnormal) findings on radiological and other examination of gastrointestinal tract 08/12/2013   Lateral epicondylitis of right elbow 07/21/2013   Right shoulder pain 07/21/2013   Sprain of wrist, right 07/21/2013   Constipation 04/15/2013   Rectal bleeding 04/15/2013    PCP: Rik Glinda DASEN, FNP  REFERRING PROVIDER: Rik Glinda DASEN, FNP  REFERRING DIAG: [phone] (ICD-10-CM) - Pain in left knee  THERAPY DIAG:  Difficulty in walking, not elsewhere classified  Stiffness of left knee, not elsewhere classified  Chronic pain of left knee  Chronic pain of right knee  Stiffness of right knee, not elsewhere classified  Muscle weakness (generalized)  Rationale for Evaluation and Treatment: Rehabilitation  ONSET DATE:  Years off and on   SUBJECTIVE:   SUBJECTIVE STATEMENT:  Good took some tylenol    PERTINENT HISTORY:  See above  PAIN: low back 5/5   PRECAUTIONS: Other: active blood  thinners due to recent PE   RED FLAGS: None   WEIGHT BEARING RESTRICTIONS: No  FALLS:  Has patient fallen in last 6 months? Yes. 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  DIAGNOSTIC FINDINGS:   Narrative & Impression CLINICAL DATA:  Chest pain   EXAM: PORTABLE CHEST 1 VIEW   COMPARISON:  Chest x-ray 08/16/2023   FINDINGS: The heart is mildly enlarged. The lungs are clear. There is no pleural effusion or pneumothorax. No acute fractures are seen.   IMPRESSION: Mild cardiomegaly. No acute pulmonary process.  IMPRESSION: 1. No pulmonary embolism. No acute intrathoracic pathology identified. 2. Morphologic changes in keeping with pulmonary arterial hypertension. 3. Mild cardiomegaly. 4. Mild hepatic steatosis.   PATIENT SURVEYS:   LEFS 46/80   COGNITION: Overall cognitive status: Within functional limits for tasks assessed     FLEXIBILITY  Tight hip ER groups, consistent toe out with gait and static positions Tight HS and hip flexors per observation     LOWER EXTREMITY ROM:  Active ROM Right eval Left eval Right AROM 11/14/23 Left AROM 11/14/23 RT/left 11/27/23 RT/Left 12/11/23 RT/Left 12/31/23 RT/Left 01/15/24 RT/LT RT/LT 03/04/24  Hip flexion            Hip extension            Hip abduction            Hip adduction            Hip internal rotation            Hip external rotation            Knee flexion 103* 109* 102 100 110/115 108/98 100/106 104/107 100/105 100/101  Knee extension 9* 12* 3 0 0/0       Ankle dorsiflexion            Ankle plantarflexion            Ankle inversion            Ankle eversion             (Blank rows = not tested)  LOWER EXTREMITY MMT:  MMT Right eval Left eval Right 11/14/23 Left 11/14/23 RT/Left RT /left 12/11/23 RT/Left 12/31/23 RT/Left 03/18/24   Hip flexion 4+ 4+ 5 5      Hip extension          Hip abduction 3 3+ 5 5       Hip adduction          Hip internal rotation          Hip external rotation          Knee flexion 4 4 4+ 4 4+/4+ 4+ BIL with pain 4+ bilat  5/5 bilat some knee pain  Knee extension 4 4 4+ 4+ 4+/4+ 4+ BIL with pain 5/4+ 5/4+  Ankle dorsiflexion          Ankle plantarflexion  Ankle inversion          Ankle eversion           (Blank rows = not tested)                                                                                                                                  TREATMENT DATE:  03/18/24 NuStep L6 x6 min LE AROM  R knee AROM 0-105, L knee AROM 0-104 Sit to stand 2x10 holding 4lb 6in step ups x10 each 30lb resisted side steps 4 way x 3 each  03/16/24 NuStep L 5 x 6 min Slant board calf stretch 4in step ups x 10 each sit to stands 2x10 HS curls 35lb 2x10 Leg Ext 10lb 2x10 T-mill pushes pushes 5x30 steps  30lb resisted side steps 4 way x 3 min  03/04/24 NuStep L5 x 6 min Bilateral knee PROM with end range holds Went over HEP and gave pt another print out Sit to stands 2x10 6in forward & Lateral step ups x10 each 2 rails HS curls 35lb 2x10 Leg Ext 10lb 2x10  02/11/24 NuStep L5 x 6 min Lower Extremity Functional Score: 42 / 80 = 52.5 % Hamstring curls 35#  3 sets 10 Knee ext 10# 3 x10 Sit to stand 2x10     PATIENT EDUCATION:  Education details: exam findings, POC, HEP  Person educated: Patient Education method: Programmer, Multimedia, Demonstration, and Handouts Education comprehension: verbalized understanding, returned demonstration, and needs further education  HOME EXERCISE PROGRAM: Access Code: [email or web address] URL: [email or web address] Date: 03/04/2024 Prepared by: Tanda Sorrow  Exercises - Supine Figure 4 Piriformis Stretch  - 1 x daily - 7 x weekly - 2 sets - 4-6 reps - 30 seconds  hold - Supine Bridge with Resistance Band  - 1 x daily - 7 x weekly - 2 sets - 10 reps - 2 seconds  hold - Clamshell with Resistance  - 1 x daily -  7 x weekly - 2 sets - 10 reps - 1 second  hold - Seated Knee Extension with Anchored Resistance  - 1 x daily - 7 x weekly - 2 sets - 10 reps - 2 seconds  hold - Sit to Stand  - 1 x daily - 7 x weekly - 3 sets - 10 reps  ASSESSMENT: CLINICAL IMPRESSION:   Patient is a 69 y.o. F who was seen today for physical therapy treatment for M25.562 (ICD-10-CM) - Pain in left knee. pnt arrives with low back, ankle, and Knee pain. Pt repots her biggest limitation is walking tolerance due to knee and ankle pain. Some increase pain and  fatigue with functional interventions. CGA required with step ups and side steps. Encourage more HEP completion, not doing everyday.   Will re-cert one time and D/C  OBJECTIVE IMPAIRMENTS: Abnormal gait, decreased activity tolerance, decreased mobility, difficulty walking, decreased ROM, decreased strength, impaired flexibility, obesity, and pain.   ACTIVITY LIMITATIONS:  standing, squatting, stairs, transfers, and locomotion level  PARTICIPATION LIMITATIONS: meal prep, cleaning, laundry, driving, shopping, community activity, and yard work  PERSONAL FACTORS: Age, Behavior pattern, Education, Fitness, Past/current experiences, Social background, and Time since onset of injury/illness/exacerbation are also affecting patient's functional outcome.   REHAB POTENTIAL: Fair body habitus, chronicity of pain, limited success with PT in the past   CLINICAL DECISION MAKING: Stable/uncomplicated  EVALUATION COMPLEXITY: Low   GOALS: Goals reviewed with patient? No  SHORT TERM GOALS: Target date: 11/27/2023   Will be compliant with appropriate progressive HEP Goal status: 11/06/23 MET for initial HEP   2. Knee AROM flexion to be at least 115* B and extension AROM to be no more than 5* B  Goal status: Ongoing 11/14/23  and 11/21/23  11/27/23 met for ext and progressing for flexion . 12/18/23  Rt knee AROM   0-108               Left knee AROM 2-110, Ongoing 02/11/24, Partly met 0  ext bilaterally lacking flex 03/18/24   3. Will be independent with BLE edema management strategies  Goal status: progressing 11/21/23  10/16 /25 MET as she can verb tech but edema still an issue   4. Gait pattern to have normalized to include consistent step through pattern with good heel-toe mechanics, no toe out with gait Goal status: Progressing 11/14/23     11/27/23 progressing 12/11/23 progressing  12/18/23 progressing  01/13/24 progressing, ongoing 02/11/24, Met 03/18/24  LONG TERM GOALS: Target date: 01/30/24    MMT to have improved by one grade all weak groups Goal status: Progressing 11/14/23  and 11/27/23 12/11/23 slight set back after fall , increased pain 12/18/23 MET   2. Will score at least 45 on Berg to show reduced fall risk  Goal status: 12/18/23 MET 01/02/24 51/56   3. Will be able to ascend and descend stairs reciprocally with no increase in pain Goal status: 12/18/23 progressing- still limited, Ongoing 12/31/23 and 01/13/24, ongoing 03/18/24   4. Will be able to ambulate community distances and perform all household tasks with no increase in pain  Goal status: 12/11/23 progressing, ongoing 12/31/23  and 01/13/24, pain can be form 0-8 02/11/24, 8/10 ongoing 03/04/24   5. LEFS to have improved by at least 10 points to show improved QOL and subjective improvement   Ongoing 02/11/24    PLAN:  PT FREQUENCY: 2x/week  PT DURATION: 8 weeks  PLANNED INTERVENTIONS: 97750- Physical Performance Testing, 97110-Therapeutic exercises, 97530- Therapeutic activity, [id number]- Neuromuscular re-education, 97535- Self Care, [id number]- Manual therapy, [id number]- Gait training, [phone]- Aquatic Therapy, [phone]- Vasopneumatic device, and Patient/Family education  PLAN FOR NEXT SESSION: progress strength and func gait .  Tanda Sorrow, PTA 03/18/24 5:17 PM  "

## 2024-03-23 ENCOUNTER — Ambulatory Visit: Admitting: Physical Therapy

## 2024-03-25 ENCOUNTER — Ambulatory Visit: Admitting: Physical Therapy

## 2024-06-15 ENCOUNTER — Ambulatory Visit: Admitting: Podiatry
